# Patient Record
Sex: Male | Born: 1935 | Race: White | Hispanic: No | Smoking: Never smoker
Health system: Southern US, Community
[De-identification: ages and names within clinical notes are randomized; demographics above are authoritative.]

## PROBLEM LIST (undated history)

## (undated) ENCOUNTER — Emergency Department (HOSPITAL_COMMUNITY): Admission: EM | Payer: Medicare Other | Source: Home / Self Care

## (undated) DIAGNOSIS — F419 Anxiety disorder, unspecified: Secondary | ICD-10-CM

## (undated) DIAGNOSIS — H269 Unspecified cataract: Secondary | ICD-10-CM

## (undated) DIAGNOSIS — K219 Gastro-esophageal reflux disease without esophagitis: Secondary | ICD-10-CM

## (undated) DIAGNOSIS — I255 Ischemic cardiomyopathy: Secondary | ICD-10-CM

## (undated) DIAGNOSIS — I10 Essential (primary) hypertension: Secondary | ICD-10-CM

## (undated) DIAGNOSIS — K222 Esophageal obstruction: Secondary | ICD-10-CM

## (undated) DIAGNOSIS — E785 Hyperlipidemia, unspecified: Secondary | ICD-10-CM

## (undated) DIAGNOSIS — J189 Pneumonia, unspecified organism: Secondary | ICD-10-CM

## (undated) DIAGNOSIS — I2510 Atherosclerotic heart disease of native coronary artery without angina pectoris: Secondary | ICD-10-CM

## (undated) DIAGNOSIS — G459 Transient cerebral ischemic attack, unspecified: Secondary | ICD-10-CM

## (undated) DIAGNOSIS — E559 Vitamin D deficiency, unspecified: Secondary | ICD-10-CM

## (undated) DIAGNOSIS — K449 Diaphragmatic hernia without obstruction or gangrene: Secondary | ICD-10-CM

## (undated) DIAGNOSIS — N189 Chronic kidney disease, unspecified: Secondary | ICD-10-CM

## (undated) DIAGNOSIS — K635 Polyp of colon: Secondary | ICD-10-CM

## (undated) HISTORY — DX: Vitamin D deficiency, unspecified: E55.9

## (undated) HISTORY — DX: Ischemic cardiomyopathy: I25.5

## (undated) HISTORY — PX: CARDIAC SURGERY: SHX584

## (undated) HISTORY — PX: CHOLECYSTECTOMY: SHX55

## (undated) HISTORY — DX: Polyp of colon: K63.5

## (undated) HISTORY — PX: APPENDECTOMY: SHX54

## (undated) HISTORY — DX: Anxiety disorder, unspecified: F41.9

## (undated) HISTORY — DX: Pneumonia, unspecified organism: J18.9

## (undated) HISTORY — DX: Unspecified cataract: H26.9

## (undated) HISTORY — DX: Esophageal obstruction: K22.2

## (undated) HISTORY — PX: SHOULDER SURGERY: SHX246

## (undated) HISTORY — DX: Chronic kidney disease, unspecified: N18.9

## (undated) HISTORY — DX: Hyperlipidemia, unspecified: E78.5

## (undated) HISTORY — DX: Transient cerebral ischemic attack, unspecified: G45.9

## (undated) HISTORY — DX: Diaphragmatic hernia without obstruction or gangrene: K44.9

---

## 2001-05-12 ENCOUNTER — Ambulatory Visit (HOSPITAL_COMMUNITY): Admission: RE | Admit: 2001-05-12 | Discharge: 2001-05-12 | Payer: Self-pay | Admitting: Gastroenterology

## 2001-05-12 ENCOUNTER — Encounter (INDEPENDENT_AMBULATORY_CARE_PROVIDER_SITE_OTHER): Payer: Self-pay | Admitting: *Deleted

## 2001-05-19 ENCOUNTER — Encounter: Admission: RE | Admit: 2001-05-19 | Discharge: 2001-05-19 | Payer: Self-pay | Admitting: Gastroenterology

## 2001-05-19 ENCOUNTER — Encounter: Payer: Self-pay | Admitting: Gastroenterology

## 2001-07-21 ENCOUNTER — Encounter (INDEPENDENT_AMBULATORY_CARE_PROVIDER_SITE_OTHER): Payer: Self-pay | Admitting: *Deleted

## 2001-07-21 ENCOUNTER — Ambulatory Visit (HOSPITAL_COMMUNITY): Admission: RE | Admit: 2001-07-21 | Discharge: 2001-07-21 | Payer: Self-pay | Admitting: Gastroenterology

## 2001-10-15 ENCOUNTER — Ambulatory Visit (HOSPITAL_COMMUNITY): Admission: RE | Admit: 2001-10-15 | Discharge: 2001-10-15 | Payer: Self-pay | Admitting: Oncology

## 2001-10-15 ENCOUNTER — Encounter (HOSPITAL_COMMUNITY): Payer: Self-pay | Admitting: Oncology

## 2001-10-20 ENCOUNTER — Ambulatory Visit (HOSPITAL_COMMUNITY): Admission: RE | Admit: 2001-10-20 | Discharge: 2001-10-20 | Payer: Self-pay | Admitting: Oncology

## 2001-10-20 ENCOUNTER — Encounter (HOSPITAL_COMMUNITY): Payer: Self-pay | Admitting: Oncology

## 2001-11-02 ENCOUNTER — Encounter (INDEPENDENT_AMBULATORY_CARE_PROVIDER_SITE_OTHER): Payer: Self-pay | Admitting: Specialist

## 2001-11-02 ENCOUNTER — Ambulatory Visit (HOSPITAL_COMMUNITY): Admission: RE | Admit: 2001-11-02 | Discharge: 2001-11-02 | Payer: Self-pay | Admitting: Oncology

## 2002-04-04 ENCOUNTER — Encounter: Admission: RE | Admit: 2002-04-04 | Discharge: 2002-04-04 | Payer: Self-pay | Admitting: Gastroenterology

## 2002-04-04 ENCOUNTER — Encounter: Payer: Self-pay | Admitting: Gastroenterology

## 2002-04-13 ENCOUNTER — Encounter (INDEPENDENT_AMBULATORY_CARE_PROVIDER_SITE_OTHER): Payer: Self-pay | Admitting: Specialist

## 2002-04-13 ENCOUNTER — Ambulatory Visit (HOSPITAL_COMMUNITY): Admission: RE | Admit: 2002-04-13 | Discharge: 2002-04-13 | Payer: Self-pay | Admitting: Gastroenterology

## 2004-05-22 ENCOUNTER — Ambulatory Visit (HOSPITAL_COMMUNITY): Admission: RE | Admit: 2004-05-22 | Discharge: 2004-05-22 | Payer: Self-pay | Admitting: Internal Medicine

## 2004-10-11 ENCOUNTER — Ambulatory Visit: Payer: Self-pay | Admitting: Oncology

## 2005-01-13 ENCOUNTER — Ambulatory Visit: Payer: Self-pay | Admitting: Oncology

## 2005-03-06 ENCOUNTER — Encounter (INDEPENDENT_AMBULATORY_CARE_PROVIDER_SITE_OTHER): Payer: Self-pay | Admitting: *Deleted

## 2005-03-06 ENCOUNTER — Ambulatory Visit (HOSPITAL_COMMUNITY): Admission: RE | Admit: 2005-03-06 | Discharge: 2005-03-06 | Payer: Self-pay | Admitting: Gastroenterology

## 2005-04-11 ENCOUNTER — Ambulatory Visit: Payer: Self-pay | Admitting: Oncology

## 2007-05-19 ENCOUNTER — Ambulatory Visit (HOSPITAL_COMMUNITY): Admission: RE | Admit: 2007-05-19 | Discharge: 2007-05-19 | Payer: Self-pay | Admitting: Gastroenterology

## 2007-05-19 ENCOUNTER — Encounter (INDEPENDENT_AMBULATORY_CARE_PROVIDER_SITE_OTHER): Payer: Self-pay | Admitting: Gastroenterology

## 2011-03-03 ENCOUNTER — Other Ambulatory Visit: Payer: Self-pay | Admitting: *Deleted

## 2011-03-03 MED ORDER — SIMVASTATIN 40 MG PO TABS: 40.0000 mg | ORAL_TABLET | Freq: Every day | ORAL | Status: DC

## 2011-04-08 NOTE — Op Note (Signed)
NAME:  Bruce Green, Bruce Green NO.:  [id number]   MEDICAL RECORD NO.:  [id number]          PATIENT TYPE:  AMB   LOCATION:  ENDO                         FACILITY:  [facility]   PHYSICIAN:  Anselmo Rod, M.D.  DATE OF BIRTH:  1919-03-07   DATE OF PROCEDURE:  05/19/2007  DATE OF DISCHARGE:                               OPERATIVE REPORT   PROCEDURE PERFORMED:  Esophagogastroduodenoscopy with antral biopsies  and dilatation of a distal esophageal stricture.   ENDOSCOPIST:  Anselmo Rod, M.D.   INSTRUMENT USED:  Pentax video panendoscope.   INDICATIONS FOR PROCEDURE:  An 77 year old white male with a history of  esophageal strictures presenting with recurrent dysphagia, especially  for solids, undergoing EGD.  Dilatation is planned if needed.   PREPROCEDURE PREPARATION:  Informed consent was procured from the  patient.  The patient had fasted for 8 hours prior to the procedure.  The risks and benefits of the procedure were discussed with the patient  in great detail.   PREPROCEDURE PHYSICAL:  VITAL SIGNS:  The patient had stable vital  signs.  NECK:  Supple.  CHEST:  Clear to auscultation.  S1, S2 regular.  ABDOMEN:  Soft with normal bowel sounds.   DESCRIPTION OF PROCEDURE:  The patient was placed in the left lateral  decubitus position and sedated with 50 0 mcg of fentanyl and 5 mg of  Versed given intravenously in slow incremental doses. Once the patient  was adequately sedate and maintained on low-flow oxygen and continuous  cardiac monitoring, the Pentax video panendoscope was advanced through  the mouthpiece, over the tongue, into the esophagus under direct vision.  Mild distal esophageal narrowing was noted and this was dilated with a  controlled radial expansion balloon from 18 mm to 19 mm, to 20 mm.  The  patient tolerated the dilatation well.  There was no heme at the site of  the dilatation. A hiatal hernia was appreciated on retroflexion.  Multiple antral  erosions were present and biopsies were done to rule out  presence of H. pylori by pathology.  A few small sessile gastric polyps  were noted.  A couple of these were biopsied for pathology as well. The  proximal small bowel appeared normal.  There was no outlet obstruction.  The proximal and mid esophagus appeared normal as well.   IMPRESSION:  1. Mild distal esophageal narrowing, dilated with a balloon dilator up      to 20 mm.  2. Multiple antral erosions, biopsies done.  3. A few sessile gastric polyps, a couple of these were biopsied for      pathology.  4. Normal proximal small bowel.   RECOMMENDATIONS:  1. The patient has been advised to chew his food well and drink plenty      of fluids with his meals.  2. Continue PPI.  3. Treat with antibiotics if H. pylori present on biopsies.  4. Avoid all nonsteroidals especially over the next 4 weeks.  5. Outpatient follow-up in the next 2 weeks for further      recommendations.      Jyothi  Elsie Amis, M.D.  Electronically Signed     JNM/MEDQ  D:  05/19/2007  T:  05/19/2007  Job:  [id number]   cc:   Caryn Bee L. Little, M.D.  Fax: [phone]

## 2011-04-11 NOTE — Op Note (Signed)
[place]. [facility]  Patient:    MAKAYLA, CONFER Visit Number: [id number] MRN: [id number]          Service Type: END Location: ENDO Attending Physician:  Charna Elizabeth Dictated by:   Anselmo Rod, M.D. Proc. Date: 07/04/99 Admit Date:  04/13/2002 Discharge Date: 04/13/2002                             Operative Report  DATE OF BIRTH:  July 16, 1919  REFERRING PHYSICIAN:  PROCEDURE PERFORMED:  Esophagogastroduodenoscopy with biopsies and epinephrine injection into a gastric nodule.  ENDOSCOPIST:  Anselmo Rod, M.D.  INSTRUMENT USED:  Olympus video panendoscope.  INDICATIONS FOR PROCEDURE:  The patient is an 77 year old white male with a history of dysphagia.  The patient had a food impaction recently that resolved with barium swallow.  There was a question of a stricture in the distal esophagus where the barium tablet passed without delay.  Esophageal dilatation is planned if needed.  PREPROCEDURE PREPARATION:  Informed consent was procured from the patient. The patient was fasted for eight hours prior to the procedure and was asked to remain off of all nonsteroidals including aspirin for a week prior to the procedure.  PREPROCEDURE PHYSICAL:  The patient had stable vital signs.  Neck supple. Chest clear to auscultation.  S1, S2 regular.  Abdomen soft with normal abdominal bowel sounds.  DESCRIPTION OF PROCEDURE:  The patient was placed in left lateral decubitus position and sedated with 70 mg of Demerol and 7 mg of Versed intravenously. Once the patient was adequately sedated and maintained on low-flow oxygen and continuous cardiac monitoring, the Olympus video panendoscope was advanced through the mouthpiece, over the tongue, into the esophagus under direct vision.  The esophagus appeared normal without any evidence of stricture, esophagitis, Barretts or masses.  There was evidence of a hiatal hernia seen on retroflexion.  A small  nodule was seen on the greater curvature just below the hiatal hernia.  This was biopsied for pathology times one.  There was significant bleeding from the biopsy site and therefore 3.5 cc of epinephrine were injected into the site.  Bleeding was controlled.  There well-appearing diffuse gastritis throughout the gastric mucosa with no definite ulceration seen.  The proximal small bowel appeared normal.  IMPRESSION: 1. Small hiatal hernia. 2. Normal-appearing esophagus, no stricture noted. 3. Small nodule biopsied on greater curvature just distal to the hiatal    hernia. 4. Diffuse gastritis. 5. Normal proximal small bowel.  RECOMMENDATION: 1. Await pathology. 2. Avoid very hot or very cold meals. 3. Chew food well. 4. Outpatient follow-up for further recommendations in the next seven to ten    days. Dictated by:   Anselmo Rod, M.D. Attending Physician:  Charna Elizabeth DD:  04/14/02 TD:  04/15/02 Job: [id number]

## 2011-04-11 NOTE — Procedures (Signed)
[place]. [facility]  Patient:    Bruce Green, Bruce Green Visit Number: [id number] MRN: [id number]          Service Type: Attending:  Anselmo Rod, M.D. Proc. Date: 07/21/01   CC:         Lilia Pro, M.D.   Procedure Report  DATE OF BIRTH:  19-Apr-1919  REFERRING PHYSICIAN:  Lilia Pro, M.D.  PROCEDURE PERFORMED:  Esophagogastroduodenoscopy with Savary dilatation of an esophageal stricture and gastric biopsies..  ENDOSCOPIST:  Anselmo Rod, M.D.  INSTRUMENT USED:  Olympus video panendoscope and Savary dilators.  INDICATIONS FOR PROCEDURE:  Difficulty swallowing which is intermittent and worse with solids in an 77 year old white male with a distal esophageal stricture seen on a barium swallow.  EGD is planned with dilatation if necessary.  PREPROCEDURE PREPARATION:  Informed consent was procured from the patient. The patient was fasted for eight hours prior to the procedure and was advised to stay off of all nonsteroidals prior to the procedure.  PREPROCEDURE PHYSICAL:  The patient had stable vital signs.  Neck supple. Chest clear to auscultation.  S1, S2 regular.  Abdomen soft with normal abdominal bowel sounds.  DESCRIPTION OF PROCEDURE:  The patient was placed in left lateral decubitus position and sedated with 30 mg of Demerol and 5 mg of Versed intravenously. Once the patient was adequately sedated and maintained on low-flow oxygen and continuous cardiac monitoring, the Olympus video panendoscope was advanced through the mouthpiece, over the tongue, into the esophagus under direct vision.  The proximal esophagus esophagus appeared normal.  There was subtle narrowing of the distal esophagus for about 1 cm.  This area was gently negotiated with the scope and the scope was advanced into the stomach.  There was a hiatal seen on retroflexion and mild antral gastritis appreciated on examination of the stomach.  The proximal small bowel  appeared normal.  After passing a guide wire in a routine manner, Savary dilator #14, 15 and 16 were used serially to dilate the distal esophageal narrowing.  The patient tolerated the procedure well without complications.  Repeat EGD was done after dilatation to check for any esophageal tears.  The entire distal esophagus appeared normal without lesions.  A small gastric polyp was seen on the greater curvature that was biopsied for pathology.  The patient tolerated the procedure well without complication.  No ulcers or masses were seen.  IMPRESSION: 1. Distal esophageal stricture dilated with Savary dilators. 2. Small hiatal hernia. 3. Small gastric polyp along the greater curvature close to the cardia    biopsied for pathology. 4. Antral gastritis. 5. Normal proximal small bowel.  RECOMMENDATION: 1. Await pathology results. 2. Avoid all nonsteroidals including aspirin. 3. Continue proton pump inhibitors for now. 4. Outpatient follow-up in the four weeks. Attending:  Anselmo Rod, M.D. DD:  07/21/01 TD:  07/21/01 Job: [id number]

## 2011-04-11 NOTE — Op Note (Signed)
NAME:  Bruce Green, Bruce Green NO.:  [id number]   MEDICAL RECORD NO.:  [id number]                   PATIENT TYPE:  AMB   LOCATION:  ENDO                                 FACILITY:  [facility]   PHYSICIAN:  Lina Sar, M.D. [facility]               DATE OF BIRTH:  28-Aug-1919   DATE OF PROCEDURE:  05/22/2004  DATE OF DISCHARGE:                                 OPERATIVE REPORT   PROCEDURE:  Upper endoscopy and removal of foreign body.   INDICATIONS:  This 77 year old gentleman presented to Dr. Ruthine Dose this morning  with acute solid food dysphagia, also dysphagia to liquids for the past  three days.  He has a history of esophageal stricture, which has not been  dilated for several years.  He has not been able to swallow any food and has  been expectorating his saliva. He is now undergoing upper endoscopy and food  disimpaction.   ENDOSCOPE:  Olympus single-channel video scope.   SEDATION:  Versed 5 mg IV, fentanyl 50 mcg IV.   FINDINGS:  Olympus single-channel video scope passed under vision through  the posterior pharynx and into esophagus without difficulty.  The patient  was monitored by pulse oximeter.  His oxygen saturations were normal.  After  the introduction of the endoscope into the esophagus, there was a large  amount of liquid as well as food particles in the esophagus upper to the  level of proximal esophagus.  Most of this was suctioned out.  There was a  food impaction in the distal esophagus, which could not be pushed through.  An over tube was placed through the posterior pharynx and the endoscope was  reintroduced through the over tube.  At that point a snare was passed  through the endoscope and slightly pushed through the impaction and pushed  the impaction all the way through into the stomach.  At that point we were  able to clear the impaction completely and let the scope pass into the  stomach without difficulty.  Brief endoscopic procedure of the  stomach and  the duodenum were normal.  Retroflexion of endoscope revealed a normal  fundus and cardia.   Over tube was then removed and a guidewire was placed endoscopically into  the stomach.  Savary dilators passed into the guidewire using 14, 15, 16,  and 17 mm dilators.  There was small amount of blood on the first dilator  but not on the subsequent dilators.  The patient tolerated the procedure  well.   IMPRESSION:  1. Food impaction, status post removal of impaction.  2. Benign distal esophageal stricture, status post dilatation to 1 [place].   PLAN:  1. Proton pump inhibitor on daily basis.  2. Routine post-dilatation orders.  3. Patient will be followed in the office.  Lina Sar, M.D. [facility]    DB/MEDQ  D:  05/22/2004  T:  05/22/2004  Job:  [id number]   cc:   Angelena Sole, M.D. [facility]

## 2011-04-11 NOTE — Procedures (Signed)
[place]. [facility]  Patient:    Bruce Green, Bruce Green                     MRN: [id number] Proc. Date: 05/12/01 Adm. Date:  [id number] Attending:  Charna Elizabeth CC:         Lilia Pro, M.D.   Procedure Report  DATE OF BIRTH:  1919-07-17  REFERRING PHYSICIAN:  Lilia Pro, M.D.  PROCEDURE PERFORMED:  Colonoscopy with biopsies.  ENDOSCOPIST:  Anselmo Rod, M.D.  INSTRUMENT USED:  Olympus video colonoscope.  INDICATIONS FOR PROCEDURE:  The patient is a 77 year old male with a history of rectal bleeding on an intermittent basis and a family history of colon cancer.  Rule out colonic polyps, masses, hemorrhoids, etc.  PREPROCEDURE PREPARATION:  Informed consent was procured from the patient. The patient was fasted for eight hours prior to the procedure and prepped with a bottle of magnesium citrate and a gallon of NuLytely the night prior to the procedure.  PREPROCEDURE PHYSICAL:  The patient had stable vital signs.  Neck supple. Chest clear to auscultation.  S1, S2 regular.  Abdomen soft with normal abdominal bowel sounds.  DESCRIPTION OF PROCEDURE:  The patient was placed in the left lateral decubitus position and sedated with 50 mg of Demerol and 6 mg of Versed intravenously.  Once the patient was adequately sedated and maintained on low-flow oxygen and continuous cardiac monitoring, the Olympus video colonoscope was advanced from the rectum to the cecum with slight difficulty secondary to a very tortuous colon especially on the left side and pandiverticulosis.  There was inspissated stool in several of the diverticula. Moderate-sized, nonbleeding internal hemorrhoids were appreciated on retroflexion in the rectum.  The appendicular orifice seemed inflamed.  There was a blackish appearing base.  This was biopsied for pathology.  There was some mucinous discharge seen from the appendix during the procedure after several washes.  Small  sessile polyps were also seen in the cecal base.  These were biopsied with cold biopsy forceps.  The patient tolerated the procedure well without complications.  IMPRESSION: 1. Moderate-sized nonbleeding internal hemorrhoids. 2. Pandiverticulosis. 3. ____________ inflamed appendicular orifice with question mucin discharge.    Biopsies done, results pending. 4. Several small sessile polyps biopsied from cecal base.  RECOMMENDATIONS: 1. Await pathology results. 2. Avoid all nonsteroidals. 3. Considering the patients problem with early satiety and dysphagia, a    barium swallow has been scheduled for him. 4. Outpatient follow-up after the study has been done.DD:  05/12/01 TD:  05/12/01 Job: [id number]

## 2012-04-09 ENCOUNTER — Encounter (HOSPITAL_COMMUNITY): Payer: Self-pay

## 2012-04-09 ENCOUNTER — Ambulatory Visit (HOSPITAL_COMMUNITY): Admit: 2012-04-09 | Payer: Self-pay | Admitting: Internal Medicine

## 2012-04-09 ENCOUNTER — Encounter (HOSPITAL_COMMUNITY): Disposition: A | Discharge status: Home / Self Care | Payer: Self-pay | Attending: Internal Medicine

## 2012-04-09 ENCOUNTER — Encounter (HOSPITAL_COMMUNITY): Payer: Self-pay | Admitting: Internal Medicine

## 2012-04-09 ENCOUNTER — Ambulatory Visit (HOSPITAL_COMMUNITY)
Admit: 2012-04-09 | Discharge: 2012-04-09 | Disposition: A | Discharge status: Home / Self Care | Payer: Medicare Other | Source: Other Acute Inpatient Hospital | Attending: Internal Medicine | Admitting: Internal Medicine

## 2012-04-09 DIAGNOSIS — K449 Diaphragmatic hernia without obstruction or gangrene: Secondary | ICD-10-CM | POA: Insufficient documentation

## 2012-04-09 DIAGNOSIS — K222 Esophageal obstruction: Secondary | ICD-10-CM

## 2012-04-09 DIAGNOSIS — R1314 Dysphagia, pharyngoesophageal phase: Secondary | ICD-10-CM

## 2012-04-09 DIAGNOSIS — R1310 Dysphagia, unspecified: Secondary | ICD-10-CM | POA: Insufficient documentation

## 2012-04-09 DIAGNOSIS — IMO0002 Reserved for concepts with insufficient information to code with codable children: Secondary | ICD-10-CM | POA: Insufficient documentation

## 2012-04-09 DIAGNOSIS — Z79899 Other long term (current) drug therapy: Secondary | ICD-10-CM | POA: Insufficient documentation

## 2012-04-09 DIAGNOSIS — K219 Gastro-esophageal reflux disease without esophagitis: Secondary | ICD-10-CM | POA: Diagnosis present

## 2012-04-09 DIAGNOSIS — W449XXA Unspecified foreign body entering into or through a natural orifice, initial encounter: Secondary | ICD-10-CM | POA: Diagnosis present

## 2012-04-09 DIAGNOSIS — T18108A Unspecified foreign body in esophagus causing other injury, initial encounter: Secondary | ICD-10-CM

## 2012-04-09 HISTORY — DX: Gastro-esophageal reflux disease without esophagitis: K21.9

## 2012-04-09 HISTORY — PX: ESOPHAGOGASTRODUODENOSCOPY: SHX5428

## 2012-04-09 SURGERY — EGD (ESOPHAGOGASTRODUODENOSCOPY)
Anesthesia: Moderate Sedation

## 2012-04-09 MED ORDER — PANTOPRAZOLE SODIUM 40 MG PO TBEC: 40.0000 mg | DELAYED_RELEASE_TABLET | Freq: Every day | ORAL | Status: DC

## 2012-04-09 MED ORDER — FENTANYL CITRATE 0.05 MG/ML IJ SOLN
INTRAMUSCULAR | Status: AC
  Filled 2012-04-09: qty 4

## 2012-04-09 MED ORDER — BUTAMBEN-TETRACAINE-BENZOCAINE 2-2-14 % EX AERO
INHALATION_SPRAY | CUTANEOUS | Status: DC | PRN
  Administered 2012-04-09: 1 via TOPICAL

## 2012-04-09 MED ORDER — SODIUM CHLORIDE 0.45 % IV SOLN: Freq: Once | INTRAVENOUS | Status: DC

## 2012-04-09 MED ORDER — FENTANYL NICU IV SYRINGE 50 MCG/ML
INJECTION | INTRAMUSCULAR | Status: DC | PRN
  Administered 2012-04-09: 25 ug via INTRAVENOUS
  Administered 2012-04-09: 12.5 ug via INTRAVENOUS

## 2012-04-09 MED ORDER — SODIUM CHLORIDE 0.9 % IV SOLN
INTRAVENOUS | Status: DC
  Administered 2012-04-09: 500 mL via INTRAVENOUS

## 2012-04-09 MED ORDER — MIDAZOLAM HCL 10 MG/2ML IJ SOLN
INTRAMUSCULAR | Status: AC
  Filled 2012-04-09: qty 4

## 2012-04-09 MED ORDER — MIDAZOLAM HCL 10 MG/2ML IJ SOLN
INTRAMUSCULAR | Status: DC | PRN
  Administered 2012-04-09: 2 mg via INTRAVENOUS
  Administered 2012-04-09: 1 mg via INTRAVENOUS

## 2012-04-09 NOTE — Op Note (Signed)
[facility] [address] [place], [place]  [address]  ENDOSCOPY PROCEDURE REPORT  PATIENT:  Bruce Green, Bruce Green  MR#:  [id number] BIRTHDATE:  February 09, 1919, 89 yrs. old  GENDER:  male  ENDOSCOPIST:  Iva Boop, MD, FACG ASSISTANT:  Claudie Revering, RN [facility] and Kandice Robinsons  PROCEDURE DATE:  04/09/2012 PROCEDURE:  Esophagoscopy with balloon dilation (<12mm) ASA CLASS:  Class II INDICATIONS:  1) dysphagia food impaction  MEDICATIONS:   Fentanyl 37.5 mcg IV, Versed 3 mg IV TOPICAL ANESTHETIC:  Cetacaine Spray  DESCRIPTION OF PROCEDURE:   After the risks benefits and alternatives of the procedure were thoroughly explained, informed consent was obtained.  The Pentax Gastroscope [id number] endoscope was introduced through the mouth and advanced to the stomach body, without limitations.  The instrument was slowly withdrawn as the mucosa was carefully examined. <<PROCEDUREIMAGES>>  Retained food was present in the distal esophagus. Solid and liquid. It was advanced into the stomach with gentle pressure from the scope.  A stricture was found in the distal esophagus. Ring-like with slight heme.  A hiatal hernia was found. It was 6 cm in size. 40-46 cm  The examination was otherwise normal. Dilation was then performed at the distal esophagus  1) Dilator:  Balloon  Size(s):  15, 16.5, 18 mm Resistance:  moderate  Heme:  none Appearance:  adequate  COMPLICATIONS:  None  ENDOSCOPIC IMPRESSION: 1) Food, retained in the distal esophagus 2) Stricture in the distal esophagus - dilated to 18 mm 3) 6 cm hiatal hernia 4) Otherwise normal examination to gastric body RECOMMENDATIONS: 1) Liquids only tonight then try normal foods tomorrow 2) Change from Prilosec OTC daily to pantoprazole 40 mg daily 3) follow-up Dr. Leone Payor as needed  Iva Boop, MD, [facility]  CC:  Marinda Elk, MD and The Patient  n. eSIGNED:   Iva Boop at 04/09/2012 10:14 PM  Randa Spike,  [phone]

## 2012-04-09 NOTE — Discharge Instructions (Addendum)
Food was lodged in the esophagus. It was removed and the stricture causing obstruction was dilated. Please get the prescription for pantoprazole filled and take that every day to reduce chances of this happening again. Keep taking that medicine unless a doctor tells you to stop. Liquids only tonight and try normal foods tomorrow.  If you continue to have swallowing problems call my office and make an appointment to see Dr. Leone Payor  YOU HAD AN ENDOSCOPIC PROCEDURE TODAY: Refer to the procedure report and other information in the discharge instructions given to you for any specific questions about what was found during the examination. If this information does not answer your questions, please call Dr. Marvell Fuller office at [phone] to clarify.   YOU SHOULD EXPECT: Some feelings of bloating in the abdomen. Passage of more gas than usual. Walking can help get rid of the air that was put into your GI tract during the procedure and reduce the bloating. If you had a lower endoscopy (such as a colonoscopy or flexible sigmoidoscopy) you may notice spotting of blood in your stool or on the toilet paper. Some abdominal soreness may be present for a day or two, also.  DIET: Liquids only until tomorrow. Then try solid food. Drink plenty of fluids but you should avoid alcoholic beverages for 24 hours.   ACTIVITY: Your care partner should take you home directly after the procedure. You should plan to take it easy, moving slowly for the rest of the day. You can resume normal activity the day after the procedure however YOU SHOULD NOT DRIVE, use power tools, machinery or perform tasks that involve climbing or major physical exertion for 24 hours (because of the sedation medicines used during the test).   SYMPTOMS TO REPORT IMMEDIATELY: A gastroenterologist can be reached at any hour. Please call [phone]  for any of the following symptoms:  Following lower endoscopy (colonoscopy, flexible  sigmoidoscopy) Excessive amounts of blood in the stool  Significant tenderness, worsening of abdominal pains  Swelling of the abdomen that is new, acute  Fever of 100 or higher  Following upper endoscopy (EGD, EUS, ERCP, esophageal dilation) Vomiting of blood or coffee ground material  New, significant abdominal pain  New, significant chest pain or pain under the shoulder blades  Painful or persistently difficult swallowing  New shortness of breath  Black, tarry-looking or red, bloody stools  FOLLOW UP:  Please call with any specific questions about appointments or follow up tests.  Endoscopy Care After Please read the instructions outlined below and refer to this sheet in the next few weeks. These discharge instructions provide you with general information on caring for yourself after you leave the hospital. Your doctor may also give you specific instructions. While your treatment has been planned according to the most current medical practices available, unavoidable complications occasionally occur. If you have any problems or questions after discharge, please call your doctor. HOME CARE INSTRUCTIONS Activity  You may resume your regular activity but move at a slower pace for the next 24 hours.   Take frequent rest periods for the next 24 hours.   Walking will help expel (get rid of) the air and reduce the bloated feeling in your abdomen.   No driving for 24 hours (because of the anesthesia (medicine) used during the test).   You may shower.   Do not sign any important legal documents or operate any machinery for 24 hours (because of the anesthesia used during the test).  Nutrition  Drink plenty  of fluids.   You may resume your normal diet.   Begin with a light meal and progress to your normal diet.   Avoid alcoholic beverages for 24 hours or as instructed by your caregiver.  Medications You may resume your normal medications unless your caregiver tells you  otherwise. What you can expect today  You may experience abdominal discomfort such as a feeling of fullness or "gas" pains.   You may experience a sore throat for 2 to 3 days. This is normal. Gargling with salt water may help this.  Follow-up Your doctor will discuss the results of your test with you. SEEK IMMEDIATE MEDICAL CARE IF:  You have excessive nausea (feeling sick to your stomach) and/or vomiting.   You have severe abdominal pain and distention (swelling).   You have trouble swallowing.   You have a temperature over 100 F (37.8 C).   You have rectal bleeding or vomiting of blood.  Document Released: 06/24/2004 Document Revised: 10/30/2011 Document Reviewed: 01/05/2008 [facility] Patient Information 2012 [place], [place].

## 2012-04-09 NOTE — H&P (Signed)
  Cc:  Dysphagia and food impaction  HPI  5 days ago ate steak and unable to keep food or liquids down since. Hx of prior stricture of esophagus and dilation, last 2008.   Allergies not on file @[facility] @ Past Medical History  Diagnosis Date  . GERD with stricture    Past Surgical History  Procedure Date  . Esophagogastroduodenoscopy multiple  . Cholecystectomy   . Shoulder surgery    History   Social History  . Marital Status: Widowed    Spouse Name: N/A    Number of Children: N/A  . Years of Education: N/A   Occupational History  . retired    Social History Main Topics  . Smoking status: Never Smoker   . Smokeless tobacco: Never Used  . Alcohol Use: 0.5 oz/week    1 drink(s) per week  . Drug Use: No  . Sexually Active: None   Other Topics Concern  . None   Social History Narrative   Retired Charity fundraiser. Widowed.   No family history on file.  PE: A & O x 3 Lungs clear Heart S1 S2 no murmur Abdomen soft and nontender Ext no edema  Assesment and plan:  Food impaction of esophagus and dysphagia

## 2012-04-12 ENCOUNTER — Ambulatory Visit (HOSPITAL_COMMUNITY): Admit: 2012-04-12 | Payer: Self-pay | Admitting: Internal Medicine

## 2012-04-12 ENCOUNTER — Encounter (HOSPITAL_COMMUNITY): Payer: Self-pay | Admitting: Internal Medicine

## 2012-04-15 ENCOUNTER — Telehealth: Payer: Self-pay | Admitting: Internal Medicine

## 2012-04-15 DIAGNOSIS — K625 Hemorrhage of anus and rectum: Secondary | ICD-10-CM

## 2012-04-15 NOTE — Telephone Encounter (Signed)
Left message for patient to call back  

## 2012-04-16 NOTE — Telephone Encounter (Signed)
Patient calling to report on Saturday morning after his procedure, he woke up with dark red stool in his bed and in bathroom. States he was fine after this until yesterday. States he started having diarrhea. Today, he has had 4-5 urgent diarrhea stools, yellow in color. Denies cramping or pain. Eating normally. States Dr. Leone Payor did change him from Omeprazole to Protonix last Friday. Per Mike Gip, PA , go back to Omeprazole over the weekend and take Imodium prn.May need to go to urgent care if diarrhea gets worse over weekend. Call on Tuesday with update.Patient given these recommendations.

## 2012-04-20 NOTE — Telephone Encounter (Signed)
I spoke with the patient and he is much better.  No further bleeding or diarrhea.  He will call back for further questions or problems

## 2012-04-20 NOTE — Telephone Encounter (Signed)
I would like to have him set up a non-urgent office visit and also have him get a cbc due to the blood in his stool.

## 2012-04-20 NOTE — Telephone Encounter (Signed)
Patient advised he will come for CBC Thursday or Friday and REV next week on Tuesday

## 2012-04-22 ENCOUNTER — Other Ambulatory Visit (INDEPENDENT_AMBULATORY_CARE_PROVIDER_SITE_OTHER): Payer: Medicare Other

## 2012-04-22 ENCOUNTER — Telehealth: Payer: Self-pay | Admitting: Internal Medicine

## 2012-04-22 DIAGNOSIS — K625 Hemorrhage of anus and rectum: Secondary | ICD-10-CM

## 2012-04-22 LAB — CBC WITH DIFFERENTIAL/PLATELET
Basophils Relative: 0.5 % (ref 0.0–3.0)
Eosinophils Relative: 1.9 % (ref 0.0–5.0)
HCT: 47.2 % (ref 39.0–52.0)
Lymphs Abs: 2 10*3/uL (ref 0.7–4.0)
MCV: 87.3 fl (ref 78.0–100.0)
Monocytes Absolute: 0.7 10*3/uL (ref 0.1–1.0)
RBC: 5.4 Mil/uL (ref 4.22–5.81)
WBC: 10.2 10*3/uL (ref 4.5–10.5)

## 2012-04-22 NOTE — Telephone Encounter (Signed)
I advised that CBC looks good and that he should keep REV next week.

## 2012-04-22 NOTE — Progress Notes (Signed)
Quick Note:  Hgb normal - please let him know he did not lose a lot of blood Will see him for follow-up 6/4 ______

## 2012-04-22 NOTE — Progress Notes (Signed)
LM for pt to call back for lab results.

## 2012-04-23 NOTE — Progress Notes (Signed)
Pt informed of lab results.  Also reminded of his appointment next week with Dr. Leone Payor.  Wants to discuss what happened post procedure at home.  He was home alone, grandchildren went home.  He awoke and had lost bowel control and Fluid colored like orange juice came out of his mouth.  Reports feeling fine now.

## 2012-04-27 ENCOUNTER — Ambulatory Visit (INDEPENDENT_AMBULATORY_CARE_PROVIDER_SITE_OTHER): Payer: Medicare Other | Admitting: Internal Medicine

## 2012-04-27 ENCOUNTER — Encounter: Payer: Self-pay | Admitting: Internal Medicine

## 2012-04-27 VITALS — BP 144/72 | HR 80 | Ht 70.0 in | Wt 159.8 lb

## 2012-04-27 DIAGNOSIS — K222 Esophageal obstruction: Secondary | ICD-10-CM

## 2012-04-27 DIAGNOSIS — K219 Gastro-esophageal reflux disease without esophagitis: Secondary | ICD-10-CM

## 2012-04-27 NOTE — Patient Instructions (Signed)
Sorry for the problems you had after the procedure and glad you are better. Please call me back if you have swallowing problems or think you are bleeding again. Iva Boop, MD, [facility]

## 2012-04-27 NOTE — Progress Notes (Signed)
Patient ID: Bruce Green, male   DOB: [birth year], 78 y.o.   MRN: [id number]  Chief complaint:  Followup of esophageal stricture and question of bleeding after dilation  History of present illness:   Patient presents for followup after upper GI endoscopy with removal of a food impaction in the esophagus and dilation of esophageal stricture, on May 17. He called the opposite a few days after that reporting that when he awakened of the day after the procedure he had crampy abdominal pain and profuse diarrhea. It sounded like there could of been some blood in it. When I talked to him today he said it was red and black but he also said it might of engorgement. He cannot really tell me for sure what color the stool was. He is here with his granddaughter, she was not present. He is upset that he slept for about 20 hours after the procedure. He said that when he had this done 5 or 6 years ago, he did not have as much post procedure sedation issues. At this point he is having no problems, no dysphagia or heartburn, he is taking omeprazole 40 mg daily.  I did have him check a hemoglobin when he called and that was normal at 12.  Medications, allergies, past medical history, past surgical history, family history and social history are reviewed and updated in the EMR.  Physical exam:  His general appearance is that he appears extremely younger than his stated age of 32. Vital signs are listed above.  Assessment and plan:  1. Esophageal stricture   2. GERD (gastroesophageal reflux disease)    Both of these are improved status post dilation with increased omeprazole 40 mg daily. I'm not sure what happened afterwards, he had a typical amount of sedation that would not seem to be excessive even a 78 year old. He did have an extreme post procedure sedation issue, fortunately he is okay. Unfortunately he had an episode of diarrhea and soiled his bed and bedclothes and had to pay for some cleanup. I really  don't know he had a need bleeding, it's possible he had some bleeding from esophageal dilation. At any rate his stools are normal now, his hemoglobin was normal and he seems well.  We talked about other endoscopic workup, and between talking to the patient and his granddaughter have decided to observe and see if he has further bleeding, if he does, would need to consider a colonoscopy but based upon the overall scenario we don't feel like the evidence of ports doing that at this time, nor does the risk-benefit ratio favors the procedure.  CC: Daisy Floro, MD,

## 2012-12-31 ENCOUNTER — Other Ambulatory Visit: Payer: Self-pay | Admitting: Internal Medicine

## 2013-03-29 ENCOUNTER — Other Ambulatory Visit: Payer: Self-pay | Admitting: Internal Medicine

## 2014-03-24 DIAGNOSIS — I255 Ischemic cardiomyopathy: Secondary | ICD-10-CM

## 2014-03-24 HISTORY — DX: Ischemic cardiomyopathy: I25.5

## 2014-04-06 ENCOUNTER — Ambulatory Visit
Admission: RE | Admit: 2014-04-06 | Discharge: 2014-04-06 | Disposition: A | Discharge status: Home / Self Care | Payer: Medicare Other | Source: Ambulatory Visit | Attending: Family Medicine | Admitting: Family Medicine

## 2014-04-06 ENCOUNTER — Other Ambulatory Visit: Payer: Self-pay | Admitting: *Deleted

## 2014-04-06 ENCOUNTER — Other Ambulatory Visit: Payer: Self-pay | Admitting: Family Medicine

## 2014-04-06 DIAGNOSIS — R0600 Dyspnea, unspecified: Secondary | ICD-10-CM

## 2014-04-06 MED ORDER — IOHEXOL 350 MG/ML SOLN
100.0000 mL | Freq: Once | INTRAVENOUS | Status: AC | PRN
  Administered 2014-04-06: 100 mL via INTRAVENOUS

## 2014-04-10 ENCOUNTER — Encounter: Payer: Self-pay | Admitting: Cardiovascular Disease

## 2014-04-10 ENCOUNTER — Ambulatory Visit (INDEPENDENT_AMBULATORY_CARE_PROVIDER_SITE_OTHER): Payer: Medicare Other | Admitting: Cardiovascular Disease

## 2014-04-10 VITALS — BP 154/76 | HR 96 | Resp 16 | Ht 70.0 in | Wt 160.6 lb

## 2014-04-10 DIAGNOSIS — I517 Cardiomegaly: Secondary | ICD-10-CM | POA: Insufficient documentation

## 2014-04-10 DIAGNOSIS — I2510 Atherosclerotic heart disease of native coronary artery without angina pectoris: Secondary | ICD-10-CM

## 2014-04-10 DIAGNOSIS — R0989 Other specified symptoms and signs involving the circulatory and respiratory systems: Secondary | ICD-10-CM

## 2014-04-10 DIAGNOSIS — R0600 Dyspnea, unspecified: Secondary | ICD-10-CM

## 2014-04-10 DIAGNOSIS — R011 Cardiac murmur, unspecified: Secondary | ICD-10-CM | POA: Insufficient documentation

## 2014-04-10 DIAGNOSIS — R0609 Other forms of dyspnea: Secondary | ICD-10-CM

## 2014-04-10 DIAGNOSIS — R0602 Shortness of breath: Secondary | ICD-10-CM

## 2014-04-10 NOTE — Patient Instructions (Signed)
Your physician has requested that you have an echocardiogram. Echocardiography is a painless test that uses sound waves to create images of your heart. It provides your doctor with information about the size and shape of your heart and how well your heart's chambers and valves are working. This procedure takes approximately one hour. There are no restrictions for this procedure.  Dr Sallyanne Kuster recommends that you schedule a follow-up appointment after your echocardiogram.

## 2014-04-10 NOTE — Progress Notes (Signed)
Patient ID: Bruce Green, male   DOB: Mar 26, 1919, 80 y.o.   MRN: [id number]      Reason for office visit Exertional dyspnea  Bruce Green is a remarkably well-preserved 80 year old [employer] War [employer] veteran("I landed with [employer]. [employer] and went all the way to [place]"), who developed fairly abrupt onset of exertional dyspnea about 2-3 months ago. Symptoms never occur at rest. He sleeps very well and does not have orthopnea or PND or edema. He is frustrated that he cannot perform more than 15 minutes of work in his yard without becoming dyspneic. He never experiences chest tightness or pain. He complains of occasional positional lightheadedness, but has not had syncope and is unaware of palpitations.  He is also remarkably healthy. The only medication he takes is a proton pump inhibitor for gastroesophageal reflux. This has been complicated by a stricture that has required repeated dilatation.  History of shortness of breath led to a CT angiogram of the chest to exclude pulmonary embolism. None was seen. There is incidentally noted cardiomegaly with a prominent left ventricle. There is significant calcification in the coronary system, especially obvious in the proximal and mid LAD artery but also scattered in the other major coronary vessels. The descending thoracic aorta appears a little dilated with very prominent thick plaque and scattered calcifications. The ascending aorta appears relatively free of disease.   He has polycythemia vera that has not required treatment and occasional urinary tract infection. There is a history of seizures back in 1995 but he is not taking any medications for seizures at this time. He has never smoked cigarettes. He drinks a small shot of bourbon a couple of days a week.  He is very active. Enjoys dancing. He has traveled a lot and has managed 3 companies in the past. He has been a widow for last 9 years and still lives independently, taking care of his entire household. He  felt insulted when one of his previous healthcare providers told them that he was just "getting old".   Allergies  Allergen Reactions  . Sulfa Antibiotics Rash    Current Outpatient Prescriptions  Medication Sig Dispense Refill  . pantoprazole (PROTONIX) 40 MG tablet TAKE 1 TABLET BY MOUTH DAILY 30 MINUTES BEFORE BREAKFAST  90 tablet  2   No current facility-administered medications for this visit.    Past Medical History  Diagnosis Date  . Stricture esophagus     distal  . GERD (gastroesophageal reflux disease)   . Hiatal hernia     6cm    Past Surgical History  Procedure Laterality Date  . Cholecystectomy    . Shoulder surgery    . Appendectomy    . Esophagogastroduodenoscopy  04/09/2012    Procedure: ESOPHAGOGASTRODUODENOSCOPY (EGD);  Surgeon: Gatha Mayer, MD;  Location: [facility];  Service: Endoscopy;  Laterality: N/A;    Family History  Problem Relation Age of Onset  . Heart disease Mother   . Kidney disease Father     History   Social History  . Marital Status: Widowed    Spouse Name: N/A    Number of Children: N/A  . Years of Education: N/A   Occupational History  . retired    Social History Main Topics  . Smoking status: Never Smoker   . Smokeless tobacco: Never Used  . Alcohol Use: 0.5 oz/week    1 drink(s) per week     Comment: rare  . Drug Use: No  . Sexual Activity: Not on file  Other Topics Concern  . Not on file   Social History Narrative   Retired Conservation officer, historic buildings. Widowed.          Review of systems: The patient specifically denies any chest pain at rest or with exertion, dyspnea at rest, orthopnea, paroxysmal nocturnal dyspnea, syncope, palpitations, focal neurological deficits, intermittent claudication, lower extremity edema, unexplained weight gain, cough, hemoptysis or wheezing.  The patient also denies abdominal pain, nausea, vomiting, dysphagia, diarrhea, constipation, polyuria, polydipsia, dysuria, hematuria, frequency,  urgency, abnormal bleeding or bruising, fever, chills, unexpected weight changes, mood swings, change in skin or hair texture, change in voice quality, auditory or visual problems, allergic reactions or rashes, new musculoskeletal complaints other than usual "aches and pains".   PHYSICAL EXAM BP 154/76  Pulse 96  Resp 16  Ht 5\' 10"  (1.778 m)  Wt 160 lb 9.6 oz (72.848 kg)  BMI 23.04 kg/m2  General: Alert, oriented x3, no distress Head: no evidence of trauma, PERRL, EOMI, no exophtalmos or lid lag, no myxedema, no xanthelasma; normal ears, nose and oropharynx Neck: normal jugular venous pulsations and no hepatojugular reflux; brisk carotid pulses without delay and no carotid bruits Chest: clear to auscultation, no signs of consolidation by percussion or palpation, normal fremitus, symmetrical and full respiratory excursions Cardiovascular: normal position and quality of the apical impulse, regular rhythm, normal first and second heart sounds, no rubs or gallops, 2-0/2 holosystolic apical murmur Abdomen: no tenderness or distention, no masses by palpation, no abnormal pulsatility or arterial bruits, normal bowel sounds, no hepatosplenomegaly Extremities: no clubbing, cyanosis or edema; 2+ radial, ulnar and brachial pulses bilaterally; 2+ right femoral, posterior tibial and dorsalis pedis pulses; 2+ left femoral, posterior tibial and dorsalis pedis pulses; no subclavian or femoral bruits Neurological: grossly nonfocal   EKG: NSR, frequent PACs and PVCs, nonspecific ST-T changes  Lipid Panel  No results found for this basename: chol, trig, hdl, cholhdl, vldl, ldlcalc    BMET No results found for this basename: na, k, cl, co2, glucose, bun, creatinine, calcium, gfrnonaa, gfraa     ASSESSMENT AND PLAN  His symptoms are concerning for CHF and the murmur and cardiac enlargement suggest cardiomyopathy and mitral insufficiency (primary or secondary?), while heavy coronary calcification raises  the possibility of ischemic cardiomyopathy, even in the absence of angina. Despite his advanced age, he is otherwise a very well preserved individual, intent on length and quality of life. Will check echo - if LVEF is low, proceed directly to coronary angiography. If not, may consider a nuclear stress test, unless the echo shows an alternative explanation for symptoms.  Orders Placed This Encounter  Procedures  . EKG 12-Lead  . 2D Echocardiogram without contrast   No orders of the defined types were placed in this encounter.    Synda Bagent  Sanda Klein, MD, [facility] [phone] office [phone] pager

## 2014-04-12 ENCOUNTER — Ambulatory Visit (HOSPITAL_COMMUNITY)
Admission: RE | Admit: 2014-04-12 | Discharge: 2014-04-12 | Disposition: A | Discharge status: Home / Self Care | Payer: Medicare Other | Source: Ambulatory Visit | Attending: Cardiology | Admitting: Cardiology

## 2014-04-12 DIAGNOSIS — I359 Nonrheumatic aortic valve disorder, unspecified: Secondary | ICD-10-CM

## 2014-04-12 DIAGNOSIS — R0600 Dyspnea, unspecified: Secondary | ICD-10-CM

## 2014-04-12 DIAGNOSIS — R0609 Other forms of dyspnea: Secondary | ICD-10-CM | POA: Insufficient documentation

## 2014-04-12 DIAGNOSIS — R0989 Other specified symptoms and signs involving the circulatory and respiratory systems: Principal | ICD-10-CM | POA: Insufficient documentation

## 2014-04-12 DIAGNOSIS — R0602 Shortness of breath: Secondary | ICD-10-CM

## 2014-04-12 NOTE — Progress Notes (Signed)
2D Echo Performed 04/12/2014    Bruce Green, RCS Spoke with Dr. Percival Spanish before patient left office about echo.

## 2014-04-18 ENCOUNTER — Encounter: Payer: Self-pay | Admitting: Cardiovascular Disease

## 2014-04-18 ENCOUNTER — Ambulatory Visit (INDEPENDENT_AMBULATORY_CARE_PROVIDER_SITE_OTHER): Payer: Medicare Other | Admitting: Cardiovascular Disease

## 2014-04-18 ENCOUNTER — Encounter (HOSPITAL_COMMUNITY): Payer: Self-pay | Admitting: Pharmacy Technician

## 2014-04-18 VITALS — BP 120/64 | HR 74 | Ht 70.0 in | Wt 160.5 lb

## 2014-04-18 DIAGNOSIS — Z79899 Other long term (current) drug therapy: Secondary | ICD-10-CM

## 2014-04-18 DIAGNOSIS — I5020 Unspecified systolic (congestive) heart failure: Secondary | ICD-10-CM

## 2014-04-18 DIAGNOSIS — I429 Cardiomyopathy, unspecified: Secondary | ICD-10-CM

## 2014-04-18 DIAGNOSIS — I509 Heart failure, unspecified: Secondary | ICD-10-CM

## 2014-04-18 DIAGNOSIS — I428 Other cardiomyopathies: Secondary | ICD-10-CM

## 2014-04-18 DIAGNOSIS — D689 Coagulation defect, unspecified: Secondary | ICD-10-CM

## 2014-04-18 DIAGNOSIS — R5383 Other fatigue: Secondary | ICD-10-CM

## 2014-04-18 DIAGNOSIS — R5381 Other malaise: Secondary | ICD-10-CM

## 2014-04-18 DIAGNOSIS — Z01818 Encounter for other preprocedural examination: Secondary | ICD-10-CM

## 2014-04-18 LAB — BASIC METABOLIC PANEL
BUN: 29 mg/dL — ABNORMAL HIGH (ref 6–23)
CHLORIDE: 107 meq/L (ref 96–112)
CO2: 27 mEq/L (ref 19–32)
Calcium: 9.5 mg/dL (ref 8.4–10.5)
Creat: 1.07 mg/dL (ref 0.50–1.35)
GLUCOSE: 131 mg/dL — AB (ref 70–99)
POTASSIUM: 4.6 meq/L (ref 3.5–5.3)
SODIUM: 141 meq/L (ref 135–145)

## 2014-04-18 NOTE — Patient Instructions (Signed)
Your physician has requested that you have a cardiac catheterization this week. Cardiac catheterization is used to diagnose and/or treat various heart conditions. Doctors may recommend this procedure for a number of different reasons. The most common reason is to evaluate chest pain. Chest pain can be a symptom of coronary artery disease (CAD), and cardiac catheterization can show whether plaque is narrowing or blocking your heart's arteries. This procedure is also used to evaluate the valves, as well as measure the blood flow and oxygen levels in different parts of your heart. For further information please visit [facility]. Please follow instruction sheet, as given.  Please have blood work done today at the [facility] in the [employer] ([place] [facility]

## 2014-04-19 ENCOUNTER — Encounter: Payer: Self-pay | Admitting: Cardiovascular Disease

## 2014-04-19 DIAGNOSIS — I429 Cardiomyopathy, unspecified: Secondary | ICD-10-CM | POA: Insufficient documentation

## 2014-04-19 DIAGNOSIS — I5020 Unspecified systolic (congestive) heart failure: Secondary | ICD-10-CM | POA: Insufficient documentation

## 2014-04-19 LAB — CBC
HEMATOCRIT: 43.5 % (ref 39.0–52.0)
HEMOGLOBIN: 14 g/dL (ref 13.0–17.0)
MCH: 24.9 pg — ABNORMAL LOW (ref 26.0–34.0)
MCHC: 32.2 g/dL (ref 30.0–36.0)
MCV: 77.4 fL — ABNORMAL LOW (ref 78.0–100.0)
Platelets: 255 10*3/uL (ref 150–400)
RBC: 5.62 MIL/uL (ref 4.22–5.81)
RDW: 16.4 % — ABNORMAL HIGH (ref 11.5–15.5)
WBC: 8.5 10*3/uL (ref 4.0–10.5)

## 2014-04-19 LAB — PROTIME-INR
INR: 1.03 (ref <1.50)
PROTHROMBIN TIME: 13.4 s (ref 11.6–15.2)

## 2014-04-19 LAB — TSH: TSH: 1.319 u[IU]/mL (ref 0.350–4.500)

## 2014-04-19 LAB — APTT: aPTT: 31 seconds (ref 24–37)

## 2014-04-19 NOTE — Progress Notes (Signed)
Patient ID: Bruce Green, male   DOB: Jul 23, 1919, 80 y.o.   MRN: [id number]     Reason for office visit Dyspnea on exertion, followup echocardiogram  Bruce Green returns in followup after undergoing an echocardiogram for recent onset exertional dyspnea. He is a very active and mentally well preserved World War II veteran who is very frustrated with the fact that he can no longer be physically active. He again denies angina pectoris.  His echocardiogram showed the surprising finding of moderately severe depressed left ventricular systolic function with a global pattern. Extensive coronary artery calcification was noted on recent CT angiogram of the chest performed to exclude pulmonary embolism.  He is otherwise in good shape. He has polycythemia vera that has never required chemotherapy or phlebotomy. He has occasional urinary infections and a remote history of seizures for which he no longer takes medications. He has never smoked and does not have diabetes mellitus.  ECHO 04/12/2014 - Left ventricle: There was mild concentric hypertrophy. Systolic function was severely reduced. The estimated ejection fraction was in the range of 25% to 30%. Global hypokinesis. Doppler parameters are consistent with abnormal left ventricular relaxation (grade 1 diastolic dysfunction). - Aortic valve: Trileaflet. Sclerosis without stenosis. There was mild, posteriorly directed regurgitation. - Aorta: The aortic root is borderline enlarged. Aortic root dimension: 3.9 cm (ED). - Mitral valve: Moderately thickened leaflets, particularly at the leaflet tips. There is reduced anterior leaflet excursion due to posteriorly directed AI. There is mild mitral regurgitation. - Left atrium: LA volume/ BSA = 42.1 ml/m2. Moderately dilated. - Atrial septum: No defect or patent foramen ovale was identified.    Allergies  Allergen Reactions  . Sulfa Antibiotics Rash    Current Outpatient Prescriptions  Medication  Sig Dispense Refill  . B Complex-C-Calcium (B-COMPLEX/VITAMIN C PO) Take 1 tablet by mouth daily.      Marland Kitchen CALCIUM PO Take 1 tablet by mouth daily.      . naproxen sodium (ANAPROX) 220 MG tablet Take 220 mg by mouth daily.      . pantoprazole (PROTONIX) 40 MG tablet Take 40 mg by mouth daily.       No current facility-administered medications for this visit.    Past Medical History  Diagnosis Date  . Stricture esophagus     distal  . GERD (gastroesophageal reflux disease)   . Hiatal hernia     6cm    Past Surgical History  Procedure Laterality Date  . Cholecystectomy    . Shoulder surgery    . Appendectomy    . Esophagogastroduodenoscopy  04/09/2012    Procedure: ESOPHAGOGASTRODUODENOSCOPY (EGD);  Surgeon: Gatha Mayer, MD;  Location: [facility];  Service: Endoscopy;  Laterality: N/A;    Family History  Problem Relation Age of Onset  . Heart disease Mother   . Kidney disease Father     History   Social History  . Marital Status: Widowed    Spouse Name: N/A    Number of Children: N/A  . Years of Education: N/A   Occupational History  . retired    Social History Main Topics  . Smoking status: Never Smoker   . Smokeless tobacco: Never Used  . Alcohol Use: 0.5 oz/week    1 drink(s) per week     Comment: rare  . Drug Use: No  . Sexual Activity: Not on file   Other Topics Concern  . Not on file   Social History Narrative   Retired Conservation officer, historic buildings. Widowed.  Review of systems: The patient specifically denies any chest pain at rest or with exertion, dyspnea at rest, orthopnea, paroxysmal nocturnal dyspnea, syncope, palpitations, focal neurological deficits, intermittent claudication, lower extremity edema, unexplained weight gain, cough, hemoptysis or wheezing.  The patient also denies abdominal pain, nausea, vomiting, dysphagia, diarrhea, constipation, polyuria, polydipsia, dysuria, hematuria, frequency, urgency, abnormal bleeding or bruising, fever,  chills, unexpected weight changes, mood swings, change in skin or hair texture, change in voice quality, auditory or visual problems, allergic reactions or rashes, new musculoskeletal complaints other than usual "aches and pains".   PHYSICAL EXAM BP 120/64  Pulse 74  Ht 5\' 10"  (1.778 m)  Wt 160 lb 8 oz (72.802 kg)  BMI 23.03 kg/m2 General: Alert, oriented x3, no distress  Head: no evidence of trauma, PERRL, EOMI, no exophtalmos or lid lag, no myxedema, no xanthelasma; normal ears, nose and oropharynx  Neck: normal jugular venous pulsations and no hepatojugular reflux; brisk carotid pulses without delay and no carotid bruits  Chest: clear to auscultation, no signs of consolidation by percussion or palpation, normal fremitus, symmetrical and full respiratory excursions  Cardiovascular: normal position and quality of the apical impulse, regular rhythm, normal first and second heart sounds, no rubs or gallops, 7-5/6 holosystolic apical murmur  Abdomen: no tenderness or distention, no masses by palpation, no abnormal pulsatility or arterial bruits, normal bowel sounds, no hepatosplenomegaly  Extremities: no clubbing, cyanosis or edema; 2+ radial, ulnar and brachial pulses bilaterally; 2+ right femoral, posterior tibial and dorsalis pedis pulses; 2+ left femoral, posterior tibial and dorsalis pedis pulses; no subclavian or femoral bruits  Neurological: grossly nonfocal  EKG: NSR, frequent PACs and PVCs, nonspecific ST-T changes   BMET    Component Value Date/Time   NA 141 04/18/2014 1544   K 4.6 04/18/2014 1544   CL 107 04/18/2014 1544   CO2 27 04/18/2014 1544   GLUCOSE 131* 04/18/2014 1544   BUN 29* 04/18/2014 1544   CREATININE 1.07 04/18/2014 1544   CALCIUM 9.5 04/18/2014 1544     ASSESSMENT AND PLAN  The leading suspected etiology for his cardiomyopathy is multivessel coronary artery disease, not withstanding the absence of angina pectoris. If his cardiomyopathy secondary to coronary disease  it is likely to require bypass surgery. He is willing to undergo open heart surgery if this is recommended.  I've recommended that he undergo right and left heart catheterization. We have not yet started treatment with diuretics or ACE inhibitors in anticipation of the contrast phase procedure. Similarly will delay the initiation of beta blocker therapy until we have direct measurement of his hemodynamics.  The risks and benefits of right left heart catheterization and possible percutaneous revascularization with angioplasty and stent placement were discussed in detail with the patient and his family. He wishes to proceed as soon as possible.  Orders Placed This Encounter  Procedures  . CBC  . Basic metabolic panel  . Protime-INR  . APTT  . TSH  . LEFT AND RIGHT HEART CATHETERIZATION WITH CORONARY ANGIOGRAM   Meds ordered this encounter  Medications  . DISCONTD: Vitamin D, Ergocalciferol, (DRISDOL) 50000 UNITS CAPS capsule    Sig: Take 50,000 Units by mouth every 7 (seven) days.    Leela Vanbrocklin  Sanda Klein, MD, [facility] [phone] office [phone] pager

## 2014-04-21 ENCOUNTER — Other Ambulatory Visit (HOSPITAL_COMMUNITY): Payer: Self-pay | Admitting: Respiratory Therapy

## 2014-04-21 ENCOUNTER — Inpatient Hospital Stay (HOSPITAL_COMMUNITY): Payer: Medicare Other

## 2014-04-21 ENCOUNTER — Inpatient Hospital Stay (HOSPITAL_COMMUNITY)
Admission: RE | Admit: 2014-04-21 | Discharge: 2014-04-30 | DRG: 234 | Disposition: A | Discharge status: Home / Self Care | Payer: Medicare Other | Source: Ambulatory Visit | Attending: Surgery | Admitting: Surgery

## 2014-04-21 ENCOUNTER — Other Ambulatory Visit: Payer: Self-pay | Admitting: *Deleted

## 2014-04-21 ENCOUNTER — Encounter (HOSPITAL_COMMUNITY)
Admission: RE | Disposition: A | Discharge status: Home / Self Care | Payer: Self-pay | Source: Ambulatory Visit | Attending: Surgery

## 2014-04-21 ENCOUNTER — Encounter (HOSPITAL_COMMUNITY): Payer: Self-pay | Admitting: General Practice

## 2014-04-21 DIAGNOSIS — Z79899 Other long term (current) drug therapy: Secondary | ICD-10-CM

## 2014-04-21 DIAGNOSIS — I4891 Unspecified atrial fibrillation: Secondary | ICD-10-CM | POA: Diagnosis not present

## 2014-04-21 DIAGNOSIS — I472 Ventricular tachycardia, unspecified: Secondary | ICD-10-CM | POA: Diagnosis not present

## 2014-04-21 DIAGNOSIS — K219 Gastro-esophageal reflux disease without esophagitis: Secondary | ICD-10-CM | POA: Diagnosis present

## 2014-04-21 DIAGNOSIS — I2589 Other forms of chronic ischemic heart disease: Secondary | ICD-10-CM | POA: Diagnosis present

## 2014-04-21 DIAGNOSIS — I2510 Atherosclerotic heart disease of native coronary artery without angina pectoris: Principal | ICD-10-CM

## 2014-04-21 DIAGNOSIS — D62 Acute posthemorrhagic anemia: Secondary | ICD-10-CM | POA: Diagnosis not present

## 2014-04-21 DIAGNOSIS — E119 Type 2 diabetes mellitus without complications: Secondary | ICD-10-CM | POA: Diagnosis present

## 2014-04-21 DIAGNOSIS — I200 Unstable angina: Secondary | ICD-10-CM | POA: Diagnosis present

## 2014-04-21 DIAGNOSIS — E8779 Other fluid overload: Secondary | ICD-10-CM | POA: Diagnosis not present

## 2014-04-21 DIAGNOSIS — I5020 Unspecified systolic (congestive) heart failure: Secondary | ICD-10-CM | POA: Diagnosis present

## 2014-04-21 DIAGNOSIS — I4949 Other premature depolarization: Secondary | ICD-10-CM | POA: Diagnosis not present

## 2014-04-21 DIAGNOSIS — I255 Ischemic cardiomyopathy: Secondary | ICD-10-CM

## 2014-04-21 DIAGNOSIS — I4729 Other ventricular tachycardia: Secondary | ICD-10-CM | POA: Diagnosis not present

## 2014-04-21 DIAGNOSIS — D45 Polycythemia vera: Secondary | ICD-10-CM | POA: Diagnosis present

## 2014-04-21 DIAGNOSIS — Z951 Presence of aortocoronary bypass graft: Secondary | ICD-10-CM

## 2014-04-21 DIAGNOSIS — I509 Heart failure, unspecified: Secondary | ICD-10-CM

## 2014-04-21 HISTORY — DX: Atherosclerotic heart disease of native coronary artery without angina pectoris: I25.10

## 2014-04-21 HISTORY — PX: LEFT AND RIGHT HEART CATHETERIZATION WITH CORONARY ANGIOGRAM: SHX5449

## 2014-04-21 LAB — POCT I-STAT 3, ART BLOOD GAS (G3+)
Acid-base deficit: 4 mmol/L — ABNORMAL HIGH (ref 0.0–2.0)
Bicarbonate: 20 mEq/L (ref 20.0–24.0)
O2 SAT: 98 %
PO2 ART: 108 mmHg — AB (ref 80.0–100.0)
TCO2: 21 mmol/L (ref 0–100)
pCO2 arterial: 32.4 mmHg — ABNORMAL LOW (ref 35.0–45.0)
pH, Arterial: 7.399 (ref 7.350–7.450)

## 2014-04-21 LAB — POCT I-STAT 3, VENOUS BLOOD GAS (G3P V)
ACID-BASE DEFICIT: 4 mmol/L — AB (ref 0.0–2.0)
BICARBONATE: 21.1 meq/L (ref 20.0–24.0)
O2 SAT: 69 %
PO2 VEN: 37 mmHg (ref 30.0–45.0)
TCO2: 22 mmol/L (ref 0–100)
pCO2, Ven: 37.7 mmHg — ABNORMAL LOW (ref 45.0–50.0)
pH, Ven: 7.357 — ABNORMAL HIGH (ref 7.250–7.300)

## 2014-04-21 SURGERY — LEFT AND RIGHT HEART CATHETERIZATION WITH CORONARY ANGIOGRAM
Anesthesia: LOCAL

## 2014-04-21 MED ORDER — ASPIRIN 81 MG PO CHEW
CHEWABLE_TABLET | ORAL | Status: AC
  Filled 2014-04-21: qty 1

## 2014-04-21 MED ORDER — ATORVASTATIN CALCIUM 80 MG PO TABS
80.0000 mg | ORAL_TABLET | Freq: Every day | ORAL | Status: DC
  Administered 2014-04-22 – 2014-04-29 (×6): 80 mg via ORAL
  Filled 2014-04-21 (×10): qty 1

## 2014-04-21 MED ORDER — SODIUM CHLORIDE 0.9 % IV SOLN
INTRAVENOUS | Status: DC
  Administered 2014-04-21: 12:00:00 via INTRAVENOUS

## 2014-04-21 MED ORDER — LIDOCAINE HCL (PF) 1 % IJ SOLN
INTRAMUSCULAR | Status: AC
  Filled 2014-04-21: qty 30

## 2014-04-21 MED ORDER — HEPARIN (PORCINE) IN NACL 2-0.9 UNIT/ML-% IJ SOLN
INTRAMUSCULAR | Status: AC
  Filled 2014-04-21: qty 1000

## 2014-04-21 MED ORDER — SODIUM CHLORIDE 0.9 % IJ SOLN: 3.0000 mL | INTRAMUSCULAR | Status: DC | PRN

## 2014-04-21 MED ORDER — ASPIRIN 81 MG PO CHEW
81.0000 mg | CHEWABLE_TABLET | ORAL | Status: AC
  Administered 2014-04-21: 81 mg via ORAL

## 2014-04-21 MED ORDER — NITROGLYCERIN 0.2 MG/ML ON CALL CATH LAB
INTRAVENOUS | Status: AC
  Filled 2014-04-21: qty 1

## 2014-04-21 MED ORDER — ASPIRIN 81 MG PO CHEW
81.0000 mg | CHEWABLE_TABLET | Freq: Every day | ORAL | Status: DC
  Administered 2014-04-22 – 2014-04-23 (×2): 81 mg via ORAL
  Filled 2014-04-21 (×2): qty 1

## 2014-04-21 MED ORDER — FENTANYL CITRATE 0.05 MG/ML IJ SOLN
INTRAMUSCULAR | Status: AC
  Filled 2014-04-21: qty 2

## 2014-04-21 MED ORDER — MIDAZOLAM HCL 2 MG/2ML IJ SOLN
INTRAMUSCULAR | Status: AC
  Filled 2014-04-21: qty 2

## 2014-04-21 MED ORDER — SODIUM CHLORIDE 0.9 % IV SOLN
INTRAVENOUS | Status: DC
  Administered 2014-04-21: 16:00:00 via INTRAVENOUS

## 2014-04-21 MED ORDER — PANTOPRAZOLE SODIUM 40 MG PO TBEC
40.0000 mg | DELAYED_RELEASE_TABLET | Freq: Every day | ORAL | Status: DC
  Administered 2014-04-21 – 2014-04-23 (×3): 40 mg via ORAL
  Filled 2014-04-21 (×4): qty 1

## 2014-04-21 NOTE — Consult Note (Signed)
[address]       [place] [address]             [phone]      Cardiothoracic Surgery Consultation  Reason for Consult: Severe multi-vessel coronary disease with moderate LV dysfunction Referring Physician:  Dr. Lauree Chandler  Bruce Green is an 80 y.o. male.  HPI:   The patient is a fairly healthy, active and independent 80 year old gentleman who has no prior cardiac history but presents with a several month history of progressive exertional fatigue and shortness of breath. This has limited his activity over this period of time and he can't work in his yard for more than 15 minutes without having to take a break. He has had no chest discomfort. He had a CT scan of the chest to rule out PE which showed none but showed cardiomegaly and significant coronary calcification. He had an echo showed a reduced EF of 25-30% with global hypokinesis, mild AI, mild MR. Cardiac cath today shows 50-60% ostial LM and severe 3-vessel CAD with 99% mid LAD, 70% OM1, 99% OM2, and 100% occlusion of a large dominant RCA with bridging collaterals. PA was 33/7 with a PCWP of 12 and CI 2.4. PA sat 69%. ECG shows sinus rhythm with preserved R-waves.  Past Medical History  Diagnosis Date  . Stricture esophagus     distal  . GERD (gastroesophageal reflux disease)   . Hiatal hernia     6cm    Past Surgical History  Procedure Laterality Date  . Cholecystectomy    . Shoulder surgery    . Appendectomy    . Esophagogastroduodenoscopy  04/09/2012    Procedure: ESOPHAGOGASTRODUODENOSCOPY (EGD);  Surgeon: Gatha Mayer, MD;  Location: [facility];  Service: Endoscopy;  Laterality: N/A;    Family History  Problem Relation Age of Onset  . Heart disease Mother   . Kidney disease Father     Social History:  reports that he has never smoked. He has never used smokeless tobacco. He reports that he drinks about .5 ounces of alcohol per week. He reports that he does not use  illicit drugs.  Allergies:  Allergies  Allergen Reactions  . Sulfa Antibiotics Rash    Medications:  I have reviewed the patient's current medications. Prior to Admission:  Prescriptions prior to admission  Medication Sig Dispense Refill  . B Complex-C-Calcium (B-COMPLEX/VITAMIN C PO) Take 1 tablet by mouth daily.      Marland Kitchen CALCIUM PO Take 1 tablet by mouth daily.      . naproxen sodium (ANAPROX) 220 MG tablet Take 220 mg by mouth daily.      . pantoprazole (PROTONIX) 40 MG tablet Take 40 mg by mouth daily.       Scheduled: . aspirin       Continuous:  PRN: Anti-infectives   None      No results found for this or any previous visit (from the past 48 hour(s)).  No results found.  Review of Systems  Constitutional: Negative for fever, chills, weight loss, malaise/fatigue and diaphoresis.  HENT: Negative.   Eyes: Negative.   Respiratory: Negative.   Cardiovascular: Negative for chest pain, palpitations, orthopnea, leg swelling and PND.       Exertional fatigue and shortness of breath  Gastrointestinal:       Distal esophageal stricture and history of dysphagia in the past requiring dilatation. Previous endoscopy notes show that the stricture was mild.  Genitourinary: Negative.   Musculoskeletal: Negative.   Skin: Negative.   Neurological: Negative.   Endo/Heme/Allergies: Negative.   Psychiatric/Behavioral: Negative.    Blood pressure 133/78, pulse 87, temperature 97.6 F (36.4 C), temperature source Oral, resp. rate 18, height 5\' 10"  (1.778 m), weight 72.576 kg (160 lb), SpO2 94.00%. Physical Exam  Constitutional: He is oriented to person, place, and time.  Well-developed gentleman who looks younger than his stated age.  HENT:  Head: Normocephalic and atraumatic.  Mouth/Throat: Oropharynx is clear and moist.  Eyes: EOM are normal. Pupils are equal, round, and reactive to light.  Neck: Normal range of motion. Neck supple. No JVD present. No thyromegaly present.    Cardiovascular: Normal rate, regular rhythm, normal heart sounds and intact distal pulses.   No murmur heard. Respiratory: Effort normal and breath sounds normal. No respiratory distress. He has no rales. He exhibits no tenderness.  GI: Soft. Bowel sounds are normal. He exhibits no distension and no mass. There is no tenderness.  Musculoskeletal: Normal range of motion. He exhibits no edema and no tenderness.  Lymphadenopathy:    He has no cervical adenopathy.  Neurological: He is alert and oriented to person, place, and time. No cranial nerve deficit.  Skin: Skin is warm and dry.  Psychiatric: He has a normal mood and affect.   *[facility], [place], [place] [address] [phone]  ------------------------------------------------------------------- Transthoracic Echocardiography  Patient: Bruce Green, Bruce Green MR #: [id number] Study Date: 04/12/2014 Gender: M Age: 66 Height: 177.8 cm Weight: 72.6 kg BSA: 1.89 m^2 Pt. Status: Room:  [place] REFERRING Migdalia Dk, MD [place], MD SONOGRAPHER Marygrace Drought, RCS PERFORMING [facility], Outpatient  cc:  ------------------------------------------------------------------- LV EF: 25% - 30%  ------------------------------------------------------------------- Indications: 786.05 Dyspnea.  ------------------------------------------------------------------- History: PMH: Cardiomegaly. Murmur. Cardiomegaly.  ------------------------------------------------------------------- Study Conclusions  - Left ventricle: There was mild concentric hypertrophy. Systolic function was severely reduced. The estimated ejection fraction was in the range of 25% to 30%. Global hypokinesis. Doppler parameters are consistent with abnormal left ventricular relaxation (grade 1 diastolic dysfunction). - Aortic valve: Trileaflet. Sclerosis without  stenosis. There was mild, posteriorly directed regurgitation. - Aorta: The aortic root is borderline enlarged. Aortic root dimension: 3.9 cm (ED). - Mitral valve: Moderately thickened leaflets, particularly at the leaflet tips. There is reduced anterior leaflet excursion due to posteriorly directed AI. There is mild mitral regurgitation. - Left atrium: LA volume/ BSA = 42.1 ml/m2. Moderately dilated. - Atrial septum: No defect or patent foramen ovale was identified.  Impressions:  - EF 25-30%, global hypokinesis. Mild AI, mild MR, thickened mitral valve leaflets. Moderately dilated LA.  -------------------------------------------------------------------  ------------------------------------------------------------------- Left ventricle: There was mild concentric hypertrophy. Systolic function was severely reduced. The estimated ejection fraction was in the range of 25% to 30%. Global hypokinesis. Doppler parameters are consistent with abnormal left ventricular relaxation (grade 1 diastolic dysfunction). The E/e&' ratio is <8, suggesting normal LV filling pressure.  ------------------------------------------------------------------- Aortic valve: Trileaflet. Sclerosis without stenosis. Doppler: There was mild, posteriorly directed regurgitation.  ------------------------------------------------------------------- Aorta: The aortic root is borderline enlarged.  ------------------------------------------------------------------- Mitral valve: Moderately thickened leaflets, particularly at the leaflet tips. There is reduced anterior leaflet excursion due to posteriorly directed AI. There is mild mitral regurgitation.  ------------------------------------------------------------------- Left atrium: LA volume/ BSA = 42.1 ml/m2. Moderately dilated.  ------------------------------------------------------------------- Atrial septum: No defect or patent foramen ovale was  identified.  ------------------------------------------------------------------- Right ventricle: The cavity size was normal. The moderator band was prominent.  Low normal systolic function.  ------------------------------------------------------------------- Pulmonic valve: The valve appears to be grossly normal. Doppler: There was no significant regurgitation.  ------------------------------------------------------------------- Tricuspid valve: Poorly visualized. Doppler: There was no significant regurgitation.  ------------------------------------------------------------------- Pulmonary artery: The main pulmonary artery was normal-sized.  ------------------------------------------------------------------- Right atrium: The atrium was at the upper limits of normal in size.  ------------------------------------------------------------------- Pericardium: There was no pericardial effusion.  ------------------------------------------------------------------- Systemic veins: Inferior vena cava: The vessel was normal in size. The respirophasic diameter changes were in the normal range (= 50%), consistent with normal central venous pressure. Diameter: 16 mm.  ------------------------------------------------------------------- Post procedure conclusions Ascending Aorta:  - The aortic root is borderline enlarged.  ------------------------------------------------------------------- Prepared and Electronically Authenticated by  Lyman Bishop MD 2015-05-20T12:09:07   Cardiac Catheterization Operative Report  Bruce Green  [id number]  5/29/20151:45 PM  Melinda Crutch, MD  Procedure Performed:  1. Left Heart Catheterization 2. Selective Coronary Angiography 3. Right Heart Catheterization Operator: Lauree Chandler, MD  Indication: 80 yo male with newly diagnosed LV systolic dysfunction, fatigue and SOB referred for right and left heart cath.  Procedure Details:  The  risks, benefits, complications, treatment options, and expected outcomes were discussed with the patient. The patient and/or family concurred with the proposed plan, giving informed consent. The patient was brought to the cath lab after IV hydration was begun and oral premedication was given. The patient was further sedated with Versed and Fentanyl. The right groin was prepped and draped in the usual manner. Using the modified Seldinger access technique, a 5 French sheath was placed in the right femoral artery. A 7 French sheath was inserted into the right femoral vein. A balloon tipped catheter was used to perform a right heart catheterization. Standard diagnostic catheters were used to perform selective coronary angiography. A pigtail catheter was used to cross the aortic valve and measure LV pressures. There were no immediate complications. The patient was taken to the recovery area in stable condition.  Hemodynamic Findings:  Ao: 158/79  LV: 153/8/15  RA: 3  RV: 32/1/3  PA: 33/7 (mean 18)  PCWP: 12  Fick Cardiac Output: 4.58 L/min  Fick Cardiac Index: 2.4 L/min/m2  Central Aortic Saturation: 98%  Pulmonary Artery Saturation: 69%  Angiographic Findings:  Left main: Ostial 50-60% stenosis.  Left Anterior Descending Artery: Large caliber vessel that courses to the apex. The proximal and mid segments are heavily calcified. The proximal vessel has an eccentric, calcified 80% stenosis. The mid vessel is calcified with diffuse 60% stenosis followed by focal 99% stenosis. The distal vessel has diffuse 20-30% stenosis. The diagonal branch is small to moderate in caliber with proximal 80% stenosis.  Circumflex Artery: Large caliber vessel with two moderate caliber obtuse marginal branches. The AV groove Circumflex has mild diffuse plaque. The first OM is a moderate caliber vessel with ostial 70% stenosis. The second OM branch is a moderate caliber vessel with proximal 99% stenosis, long 99% stenosis mid  segment.  Right Coronary Artery: Large dominant vessel with diffuse 40% proximal stenosis. The mid vessel has 100% sub-total occlusion. The distal vessel fills from bridging collaterals. The distal vessel has diffuse 40% stenosis. A distal branch fills from left to right collaterals.  Left Ventricular Angiogram: Deferred.  Impression:  1. Severe triple vessel CAD  2. Severe LV dysfunction by echo  Recommendations: He is a very functional 80 yo male. He has severe triple vessel CAD with good distal targets. His LAD is heavily calcified with severe disease. The RCA is chronically occluded. Both  obtuse marginal branches have severe proximal disease. As outlined in the office note, Dr. Recardo Evangelist feels that he should at least be considered for bypass surgery. He has severe LV systolic dysfunction by echo. Will admit to telemetry and ask CT surgery to see him. If he is not felt to be a surgical candidate given advanced age, will consider medical management vs high risk PCI with rotablator of LAD.  Complications: None; patient tolerated the procedure well.     CLINICAL DATA: Shortness of breath with exacerbation over the past  3 months. Nodular density on recent chest x-ray. Elevated D-dimer.  Esophageal stricture.  EXAM:  CT ANGIOGRAPHY CHEST WITH CONTRAST  TECHNIQUE:  Multidetector CT imaging of the chest was performed using the  standard protocol during bolus administration of intravenous  contrast. Multiplanar CT image reconstructions and MIPs were  obtained to evaluate the vascular anatomy.  CONTRAST: 133mL OMNIPAQUE IOHEXOL 350 MG/ML SOLN  COMPARISON: 04/05/2014 chest x-ray.  FINDINGS:  Scattered pulmonary parenchymal changes suggestive of scarring/  subsegmental atelectasis. No worrisome lung mass identified. What  was noted on chest x-ray may have represented complication shadows.  Incidentally noted is what appears to be a small lymph node in the  left major fissure.  No pulmonary  embolus detected.  Cardiomegaly with left ventricular prominence.  Coronary artery calcifications.  Atherosclerotic type changes of the thoracic aorta with prominent  irregular plaque most notable descending thoracic aorta which is  ectatic.  Small hiatal hernia.  No mediastinal or hilar adenopathy.  New bony destructive lesion.  Visualized upper abdominal structures unremarkable.  Review of the MIP images confirms the above findings.  IMPRESSION:  Scattered pulmonary parenchymal changes suggestive of scarring/  subsegmental atelectasis. No worrisome lung mass identified. What  was noted on chest x-ray may have represented complication shadows.  No pulmonary embolus detected.  Cardiomegaly with left ventricular prominence.  Coronary artery calcifications.  Atherosclerotic type changes of the thoracic aorta with prominent  irregular plaque most notable descending thoracic aorta which is  ectatic.  Small hiatal hernia.  This is a call report.  Electronically Signed  By: Chauncey Cruel M.D.  On: 04/06/2014 13:34    Assessment/Plan:  He has significant ostial LM and severe 3-vessel coronary disease with moderate LV dysfunction presenting with progressive exertional fatigue and shortness of breath. He is 89 but in good condition and still quite active and independent and I think CABG is the best treatment to improve his symptoms and prevent further deterioration in LV function. He is at increased risk due to his age but his risk of surgery is still much lower than his risk with medical therapy and probably lower than his risk with a high risk PCI. I discussed the operative procedure with the patient and his 2 daughters including alternatives, benefits and risks; including but not limited to bleeding, blood transfusion, infection, stroke, myocardial infarction, graft failure, heart block requiring a permanent pacemaker, organ dysfunction, and death.  Bruce Green understands and agrees to  proceed.  We will schedule surgery for  Monday am. He should remain in the hospital until surgery.  Bruce Green 04/21/2014, 4:03 PM

## 2014-04-21 NOTE — Progress Notes (Signed)
1530 report received from Tammy,RN cardiac cath 1615 pt transferred in from cardiac ncath via bed wit staff RN and Nt

## 2014-04-21 NOTE — H&P (View-Only) (Signed)
Patient ID: Bruce Green, male   DOB: 09/21/1919, 80 y.o.   MRN: [id number]     Reason for office visit Dyspnea on exertion, followup echocardiogram  Bruce Green returns in followup after undergoing an echocardiogram for recent onset exertional dyspnea. He is a very active and mentally well preserved [employer] War [employer] veteran who is very frustrated with the fact that he can no longer be physically active. He again denies angina pectoris.  His echocardiogram showed the surprising finding of moderately severe depressed left ventricular systolic function with a global pattern. Extensive coronary artery calcification was noted on recent CT angiogram of the chest performed to exclude pulmonary embolism.  He is otherwise in good shape. He has polycythemia vera that has never required chemotherapy or phlebotomy. He has occasional urinary infections and a remote history of seizures for which he no longer takes medications. He has never smoked and does not have diabetes mellitus.  ECHO 04/12/2014 - Left ventricle: There was mild concentric hypertrophy. Systolic function was severely reduced. The estimated ejection fraction was in the range of 25% to 30%. Global hypokinesis. Doppler parameters are consistent with abnormal left ventricular relaxation (grade 1 diastolic dysfunction). - Aortic valve: Trileaflet. Sclerosis without stenosis. There was mild, posteriorly directed regurgitation. - Aorta: The aortic root is borderline enlarged. Aortic root dimension: 3.9 cm (ED). - Mitral valve: Moderately thickened leaflets, particularly at the leaflet tips. There is reduced anterior leaflet excursion due to posteriorly directed AI. There is mild mitral regurgitation. - Left atrium: LA volume/ BSA = 42.1 ml/m2. Moderately dilated. - Atrial septum: No defect or patent foramen ovale was identified.    Allergies  Allergen Reactions  . Sulfa Antibiotics Rash    Current Outpatient Prescriptions  Medication  Sig Dispense Refill  . B Complex-C-Calcium (B-COMPLEX/VITAMIN C PO) Take 1 tablet by mouth daily.      Marland Kitchen CALCIUM PO Take 1 tablet by mouth daily.      . naproxen sodium (ANAPROX) 220 MG tablet Take 220 mg by mouth daily.      . pantoprazole (PROTONIX) 40 MG tablet Take 40 mg by mouth daily.       No current facility-administered medications for this visit.    Past Medical History  Diagnosis Date  . Stricture esophagus     distal  . GERD (gastroesophageal reflux disease)   . Hiatal hernia     6cm    Past Surgical History  Procedure Laterality Date  . Cholecystectomy    . Shoulder surgery    . Appendectomy    . Esophagogastroduodenoscopy  04/09/2012    Procedure: ESOPHAGOGASTRODUODENOSCOPY (EGD);  Surgeon: Gatha Mayer, MD;  Location: [facility];  Service: Endoscopy;  Laterality: N/A;    Family History  Problem Relation Age of Onset  . Heart disease Mother   . Kidney disease Father     History   Social History  . Marital Status: Widowed    Spouse Name: N/A    Number of Children: N/A  . Years of Education: N/A   Occupational History  . retired    Social History Main Topics  . Smoking status: Never Smoker   . Smokeless tobacco: Never Used  . Alcohol Use: 0.5 oz/week    1 drink(s) per week     Comment: rare  . Drug Use: No  . Sexual Activity: Not on file   Other Topics Concern  . Not on file   Social History Narrative   Retired Conservation officer, historic buildings. Widowed.  Review of systems: The patient specifically denies any chest pain at rest or with exertion, dyspnea at rest, orthopnea, paroxysmal nocturnal dyspnea, syncope, palpitations, focal neurological deficits, intermittent claudication, lower extremity edema, unexplained weight gain, cough, hemoptysis or wheezing.  The patient also denies abdominal pain, nausea, vomiting, dysphagia, diarrhea, constipation, polyuria, polydipsia, dysuria, hematuria, frequency, urgency, abnormal bleeding or bruising, fever,  chills, unexpected weight changes, mood swings, change in skin or hair texture, change in voice quality, auditory or visual problems, allergic reactions or rashes, new musculoskeletal complaints other than usual "aches and pains".   PHYSICAL EXAM BP 120/64  Pulse 74  Ht 5\' 10"  (1.778 m)  Wt 160 lb 8 oz (72.802 kg)  BMI 23.03 kg/m2 General: Alert, oriented x3, no distress  Head: no evidence of trauma, PERRL, EOMI, no exophtalmos or lid lag, no myxedema, no xanthelasma; normal ears, nose and oropharynx  Neck: normal jugular venous pulsations and no hepatojugular reflux; brisk carotid pulses without delay and no carotid bruits  Chest: clear to auscultation, no signs of consolidation by percussion or palpation, normal fremitus, symmetrical and full respiratory excursions  Cardiovascular: normal position and quality of the apical impulse, regular rhythm, normal first and second heart sounds, no rubs or gallops, 7-5/6 holosystolic apical murmur  Abdomen: no tenderness or distention, no masses by palpation, no abnormal pulsatility or arterial bruits, normal bowel sounds, no hepatosplenomegaly  Extremities: no clubbing, cyanosis or edema; 2+ radial, ulnar and brachial pulses bilaterally; 2+ right femoral, posterior tibial and dorsalis pedis pulses; 2+ left femoral, posterior tibial and dorsalis pedis pulses; no subclavian or femoral bruits  Neurological: grossly nonfocal  EKG: NSR, frequent PACs and PVCs, nonspecific ST-T changes   BMET    Component Value Date/Time   NA 141 04/18/2014 1544   K 4.6 04/18/2014 1544   CL 107 04/18/2014 1544   CO2 27 04/18/2014 1544   GLUCOSE 131* 04/18/2014 1544   BUN 29* 04/18/2014 1544   CREATININE 1.07 04/18/2014 1544   CALCIUM 9.5 04/18/2014 1544     ASSESSMENT AND PLAN  The leading suspected etiology for his cardiomyopathy is multivessel coronary artery disease, not withstanding the absence of angina pectoris. If his cardiomyopathy secondary to coronary disease  it is likely to require bypass surgery. He is willing to undergo open heart surgery if this is recommended.  I've recommended that he undergo right and left heart catheterization. We have not yet started treatment with diuretics or ACE inhibitors in anticipation of the contrast phase procedure. Similarly will delay the initiation of beta blocker therapy until we have direct measurement of his hemodynamics.  The risks and benefits of right left heart catheterization and possible percutaneous revascularization with angioplasty and stent placement were discussed in detail with the patient and his family. He wishes to proceed as soon as possible.  Orders Placed This Encounter  Procedures  . CBC  . Basic metabolic panel  . Protime-INR  . APTT  . TSH  . LEFT AND RIGHT HEART CATHETERIZATION WITH CORONARY ANGIOGRAM   Meds ordered this encounter  Medications  . DISCONTD: Vitamin D, Ergocalciferol, (DRISDOL) 50000 UNITS CAPS capsule    Sig: Take 50,000 Units by mouth every 7 (seven) days.    Katheleen Stella  Sanda Klein, MD, [facility] [phone] office [phone] pager

## 2014-04-21 NOTE — Interval H&P Note (Signed)
History and Physical Interval Note:  04/21/2014 1:06 PM  Bruce Green  has presented today for cardiac cath with the diagnosis of CHF,systolic CHF. The various methods of treatment have been discussed with the patient and family. After consideration of risks, benefits and other options for treatment, the patient has consented to  Procedure(s): LEFT AND RIGHT HEART CATHETERIZATION WITH CORONARY ANGIOGRAM (N/A) as a surgical intervention .  The patient's history has been reviewed, patient examined, no change in status, stable for surgery.  I have reviewed the patient's chart and labs.  Questions were answered to the patient's satisfaction.    Cath Lab Visit (complete for each Cath Lab visit)  Clinical Evaluation Leading to the Procedure:   ACS: no  Non-ACS:    Anginal Classification: CCS III  Anti-ischemic medical therapy: No Therapy  Non-Invasive Test Results: No non-invasive testing performed  Prior CABG: No previous CABG        Burnell Blanks

## 2014-04-21 NOTE — CV Procedure (Signed)
Cardiac Catheterization Operative Report  Bruce Green [id number] 5/29/20151:45 PM  Melinda Crutch, MD  Procedure Performed:  1. Left Heart Catheterization 2. Selective Coronary Angiography 3. Right Heart Catheterization  Operator: Lauree Chandler, MD  Indication: 80 yo male with newly diagnosed LV systolic dysfunction, fatigue and SOB referred for right and left heart cath.                               Procedure Details: The risks, benefits, complications, treatment options, and expected outcomes were discussed with the patient. The patient and/or family concurred with the proposed plan, giving informed consent. The patient was brought to the cath lab after IV hydration was begun and oral premedication was given. The patient was further sedated with Versed and Fentanyl. The right groin was prepped and draped in the usual manner. Using the modified Seldinger access technique, a 5 French sheath was placed in the right femoral artery. A 7 French sheath was inserted into the right femoral vein. A balloon tipped catheter was used to perform a right heart catheterization. Standard diagnostic catheters were used to perform selective coronary angiography. A pigtail catheter was used to cross the aortic valve and measure LV pressures. There were no immediate complications. The patient was taken to the recovery area in stable condition.   Hemodynamic Findings: Ao: 158/79             LV: 153/8/15 RA: 3             RV: 32/1/3 PA: 33/7 (mean 18)    PCWP: 12 Fick Cardiac Output: 4.58 L/min Fick Cardiac Index: 2.4 L/min/m2 Central Aortic Saturation: 98% Pulmonary Artery Saturation: 69%  Angiographic Findings:  Left main: Ostial 50-60% stenosis.   Left Anterior Descending Artery: Large caliber vessel that courses to the apex. The proximal and mid segments are heavily calcified. The proximal vessel has an eccentric, calcified 80% stenosis. The mid vessel is calcified with diffuse 60%  stenosis followed by focal 99% stenosis. The distal vessel has diffuse 20-30% stenosis. The diagonal branch is small to moderate in caliber with proximal 80% stenosis.   Circumflex Artery: Large caliber vessel with two moderate caliber obtuse marginal branches. The AV groove Circumflex has mild diffuse plaque. The first OM is a moderate caliber vessel with ostial 70% stenosis. The second OM branch is a moderate caliber vessel with proximal 99% stenosis, long 99% stenosis mid segment.   Right Coronary Artery: Large dominant vessel with diffuse 40% proximal stenosis. The mid vessel has 100% sub-total occlusion. The distal vessel fills from bridging collaterals. The distal vessel has diffuse 40% stenosis. A distal branch fills from left to right collaterals.   Left Ventricular Angiogram: Deferred.   Impression: 1. Severe triple vessel CAD 2. Severe LV dysfunction by echo  Recommendations: He is a very functional 80 yo male. He has severe triple vessel CAD with good distal targets. His LAD is heavily calcified with severe disease. The RCA is chronically occluded. Both obtuse marginal branches have severe proximal disease. As outlined in the office note, Dr. Recardo Evangelist feels that he should at least be considered for bypass surgery. He has severe LV systolic dysfunction by echo. Will admit to telemetry and ask CT surgery to see him. If he is not felt to be a surgical candidate given advanced age, will consider medical management vs high risk PCI with rotablator of LAD.  Complications:  None; patient tolerated the procedure well.

## 2014-04-22 DIAGNOSIS — I2510 Atherosclerotic heart disease of native coronary artery without angina pectoris: Principal | ICD-10-CM

## 2014-04-22 DIAGNOSIS — I509 Heart failure, unspecified: Secondary | ICD-10-CM

## 2014-04-22 DIAGNOSIS — Z01810 Encounter for preprocedural cardiovascular examination: Secondary | ICD-10-CM

## 2014-04-22 DIAGNOSIS — I5020 Unspecified systolic (congestive) heart failure: Secondary | ICD-10-CM

## 2014-04-22 MED ORDER — DOCUSATE SODIUM 100 MG PO CAPS
100.0000 mg | ORAL_CAPSULE | Freq: Every day | ORAL | Status: DC
  Administered 2014-04-22 – 2014-04-23 (×2): 100 mg via ORAL
  Filled 2014-04-22 (×3): qty 1

## 2014-04-22 MED ORDER — ALPRAZOLAM 0.5 MG PO TABS
0.5000 mg | ORAL_TABLET | Freq: Once | ORAL | Status: AC
  Administered 2014-04-22: 0.5 mg via ORAL
  Filled 2014-04-22: qty 1

## 2014-04-22 NOTE — Progress Notes (Signed)
CARDIAC REHAB PHASE I   PRE:  Rate/Rhythm: 79 NSR with PACs  BP:  Sitting: 130/76      SaO2: 98 RA  MODE:  Ambulation: 460 ft   POST:  Rate/Rhythm: 86 NSR with PACs  BP:  Sitting: 156/80     SaO2: 99 RA  1425-1515 Patient ambulated independently in hallway. Steady gait noted. Patient denied CP or SOB during walk. Preop education done.Open heart surgery book and information shet given and encouraged patient and family to view. IS given and patient demonstrated correct use. Encouraged patient to use IS 10xhour while awake. Post walk patient back to bed with call bell in reach. Patient had previously been in chair prior to walk and requested to take a nap. Will continue to follow post-op.  Lizbet Cirrincione English PayneRN, BSN 04/22/2014 3:24 PM

## 2014-04-22 NOTE — Progress Notes (Signed)
SUBJECTIVE:  No groin bleeding.  No chest pain.  WOuld like surgery to be done today instead of waiting until Monday.  OBJECTIVE:   Vitals:   Filed Vitals:   04/21/14 2000 04/21/14 2157 04/22/14 0241 04/22/14 0557  BP: 148/57 135/84 129/67 142/77  Pulse: 75 72 71 74  Temp:  97.5 F (36.4 C) 98.1 F (36.7 C) 97.9 F (36.6 C)  TempSrc:  Oral Oral Oral  Resp: 16 18 20 20   Height:      Weight:    157 lb 10.1 oz (71.5 kg)  SpO2: 98% 97% 97% 98%   I&O's:   Intake/Output Summary (Last 24 hours) at 04/22/14 1026 Last data filed at 04/22/14 0557  Gross per 24 hour  Intake    480 ml  Output    400 ml  Net     80 ml   TELEMETRY: Reviewed telemetry pt in NSR:     PHYSICAL EXAM General: Well developed, well nourished, in no acute distress; appears much younger than stated age Head:   Normal cephalic and atramatic  Lungs:   Clear bilaterally to auscultation. Heart:   HRRR S1 S2  No JVD.   Abdomen: abdomen soft and non-tender Msk:  Back normal,  Normal strength and tone for age. Extremities:  No edema.  No groin hematoma Neuro: Alert and oriented. Psych:  Normal affect, responds appropriately   LABS: Basic Metabolic Panel: No results found for this basename: NA, K, CL, CO2, GLUCOSE, BUN, CREATININE, CALCIUM, MG, PHOS,  in the last 72 hours Liver Function Tests: No results found for this basename: AST, ALT, ALKPHOS, BILITOT, PROT, ALBUMIN,  in the last 72 hours No results found for this basename: LIPASE, AMYLASE,  in the last 72 hours CBC: No results found for this basename: WBC, NEUTROABS, HGB, HCT, MCV, PLT,  in the last 72 hours Cardiac Enzymes: No results found for this basename: CKTOTAL, CKMB, CKMBINDEX, TROPONINI,  in the last 72 hours BNP: No components found with this basename: POCBNP,  D-Dimer: No results found for this basename: DDIMER,  in the last 72 hours Hemoglobin A1C: No results found for this basename: HGBA1C,  in the last 72 hours Fasting Lipid  Panel: No results found for this basename: CHOL, HDL, LDLCALC, TRIG, CHOLHDL, LDLDIRECT,  in the last 72 hours Thyroid Function Tests: No results found for this basename: TSH, T4TOTAL, FREET3, T3FREE, THYROIDAB,  in the last 72 hours Anemia Panel: No results found for this basename: VITAMINB12, FOLATE, FERRITIN, TIBC, IRON, RETICCTPCT,  in the last 72 hours Coag Panel:   Lab Results  Component Value Date   INR 1.03 04/18/2014    RADIOLOGY: Ct Angio Chest Pe W/cm &/or Wo Cm  04/06/2014   CLINICAL DATA:  Shortness of breath with exacerbation over the past 3 months. Nodular density on recent chest x-ray. Elevated D-dimer. Esophageal stricture.  EXAM: CT ANGIOGRAPHY CHEST WITH CONTRAST  TECHNIQUE: Multidetector CT imaging of the chest was performed using the standard protocol during bolus administration of intravenous contrast. Multiplanar CT image reconstructions and MIPs were obtained to evaluate the vascular anatomy.  CONTRAST:  182mL OMNIPAQUE IOHEXOL 350 MG/ML SOLN  COMPARISON:  04/05/2014 chest x-ray.  FINDINGS: Scattered pulmonary parenchymal changes suggestive of scarring/ subsegmental atelectasis. No worrisome lung mass identified. What was noted on chest x-ray may have represented complication shadows. Incidentally noted is what appears to be a small lymph node in the left major fissure.  No pulmonary embolus detected.  Cardiomegaly with left  ventricular prominence.  Coronary artery calcifications.  Atherosclerotic type changes of the thoracic aorta with prominent irregular plaque most notable descending thoracic aorta which is ectatic.  Small hiatal hernia.  No mediastinal or hilar adenopathy.  New bony destructive lesion.  Visualized upper abdominal structures unremarkable.  Review of the MIP images confirms the above findings.  IMPRESSION: Scattered pulmonary parenchymal changes suggestive of scarring/ subsegmental atelectasis. No worrisome lung mass identified. What was noted on chest x-ray may  have represented complication shadows.  No pulmonary embolus detected.  Cardiomegaly with left ventricular prominence.  Coronary artery calcifications.  Atherosclerotic type changes of the thoracic aorta with prominent irregular plaque most notable descending thoracic aorta which is ectatic.  Small hiatal hernia.  This is a call report.   Electronically Signed   By: Chauncey Cruel M.D.   On: 04/06/2014 13:34      ASSESSMENT: PLAN:    CAD: Plan for CABG on Monday. Continue aspirin and lipitor.   Cardiomyopathy: Ischemic.  No sx of CHF at this time. Will need ACE-I and beta blocker post operatively.  WOuld not start now due to recent dye exposure and upcoming CABG.  Jettie Booze, MD  04/22/2014  10:26 AM

## 2014-04-22 NOTE — Progress Notes (Signed)
The patient's right femoral cardiac cath site remained at a level 0 overnight.  He did not have any complaints of pain and did not receive any PRN medications.  His VS are stable.

## 2014-04-22 NOTE — Progress Notes (Addendum)
Pre-op Cardiac Surgery  Carotid Findings:  Findings suggest 1-39% right internal carotid artery stenosis and upper range 1-39% left internal carotid artery stenosis. Vertebral arteries are patent with antegrade flow.  04/22/2014 4:21 PM Maudry Mayhew, RVT, RDCS, RDMS     Upper Extremity Right Left  Brachial Pressures 150 141  Radial Waveforms Bi Tri  Ulnar Waveforms Bi Tri  Palmar Arch (Allen's Test) Obliterates with radial compression, normal with ulnar compression Normal with radial and ulnar compression      Lower  Extremity Right Left  Dorsalis Pedis 107, mono 145, mono  Anterior Tibial    Posterior Tibial 130, mono 157, tri  Ankle/Brachial Indices 0.87 1.05    Landry Mellow, RDMS, RVT 04/23/2014

## 2014-04-23 DIAGNOSIS — Z01810 Encounter for preprocedural cardiovascular examination: Secondary | ICD-10-CM

## 2014-04-23 LAB — BASIC METABOLIC PANEL
BUN: 23 mg/dL (ref 6–23)
CO2: 26 mEq/L (ref 19–32)
Calcium: 9 mg/dL (ref 8.4–10.5)
Chloride: 103 mEq/L (ref 96–112)
Creatinine, Ser: 1.18 mg/dL (ref 0.50–1.35)
GFR calc Af Amer: 60 mL/min — ABNORMAL LOW (ref >90)
GFR, EST NON AFRICAN AMERICAN: 52 mL/min — AB (ref >90)
GLUCOSE: 108 mg/dL — AB (ref 70–99)
Potassium: 4.5 mEq/L (ref 3.7–5.3)
Sodium: 139 mEq/L (ref 137–147)

## 2014-04-23 LAB — TYPE AND SCREEN
ABO/RH(D): O POS
Antibody Screen: NEGATIVE

## 2014-04-23 LAB — CBC
HCT: 39.9 % (ref 39.0–52.0)
HEMOGLOBIN: 12.7 g/dL — AB (ref 13.0–17.0)
MCH: 25 pg — ABNORMAL LOW (ref 26.0–34.0)
MCHC: 31.8 g/dL (ref 30.0–36.0)
MCV: 78.7 fL (ref 78.0–100.0)
Platelets: 202 10*3/uL (ref 150–400)
RBC: 5.07 MIL/uL (ref 4.22–5.81)
RDW: 16.4 % — ABNORMAL HIGH (ref 11.5–15.5)
WBC: 9 10*3/uL (ref 4.0–10.5)

## 2014-04-23 MED ORDER — SODIUM CHLORIDE 0.9 % IV SOLN
INTRAVENOUS | Status: DC
  Filled 2014-04-23: qty 30

## 2014-04-23 MED ORDER — INSULIN REGULAR HUMAN 100 UNIT/ML IJ SOLN
INTRAMUSCULAR | Status: AC
  Administered 2014-04-24: 1.5 [IU]/h via INTRAVENOUS
  Filled 2014-04-23: qty 1

## 2014-04-23 MED ORDER — PHENYLEPHRINE HCL 10 MG/ML IJ SOLN
30.0000 ug/min | INTRAVENOUS | Status: AC
  Administered 2014-04-24: 25 ug/min via INTRAVENOUS
  Filled 2014-04-23: qty 2

## 2014-04-23 MED ORDER — PLASMA-LYTE 148 IV SOLN
INTRAVENOUS | Status: AC
  Administered 2014-04-24: 07:00:00
  Filled 2014-04-23: qty 2.5

## 2014-04-23 MED ORDER — EPINEPHRINE HCL 1 MG/ML IJ SOLN
0.5000 ug/min | INTRAVENOUS | Status: DC
  Filled 2014-04-23: qty 4

## 2014-04-23 MED ORDER — TEMAZEPAM 15 MG PO CAPS
15.0000 mg | ORAL_CAPSULE | Freq: Once | ORAL | Status: AC | PRN
  Administered 2014-04-24: 15 mg via ORAL
  Filled 2014-04-23: qty 1

## 2014-04-23 MED ORDER — DEXTROSE 5 % IV SOLN
750.0000 mg | INTRAVENOUS | Status: DC
  Filled 2014-04-23: qty 750

## 2014-04-23 MED ORDER — DEXTROSE 5 % IV SOLN
1.5000 g | INTRAVENOUS | Status: AC
  Administered 2014-04-24: 1.5 g via INTRAVENOUS
  Filled 2014-04-23: qty 1.5

## 2014-04-23 MED ORDER — NITROGLYCERIN IN D5W 200-5 MCG/ML-% IV SOLN
2.0000 ug/min | INTRAVENOUS | Status: AC
  Administered 2014-04-24: 16.67 ug/min via INTRAVENOUS
  Filled 2014-04-23: qty 250

## 2014-04-23 MED ORDER — DEXMEDETOMIDINE HCL IN NACL 400 MCG/100ML IV SOLN
0.1000 ug/kg/h | INTRAVENOUS | Status: AC
  Administered 2014-04-24: 0.2 ug/kg/h via INTRAVENOUS
  Filled 2014-04-23 (×2): qty 100

## 2014-04-23 MED ORDER — POTASSIUM CHLORIDE 2 MEQ/ML IV SOLN
80.0000 meq | INTRAVENOUS | Status: DC
  Filled 2014-04-23: qty 40

## 2014-04-23 MED ORDER — VANCOMYCIN HCL 10 G IV SOLR
1250.0000 mg | INTRAVENOUS | Status: AC
  Administered 2014-04-24: 1250 mg via INTRAVENOUS
  Filled 2014-04-23 (×2): qty 1250

## 2014-04-23 MED ORDER — CHLORHEXIDINE GLUCONATE CLOTH 2 % EX PADS
6.0000 | MEDICATED_PAD | Freq: Once | CUTANEOUS | Status: AC
  Administered 2014-04-23: 6 via TOPICAL

## 2014-04-23 MED ORDER — MAGNESIUM SULFATE 50 % IJ SOLN
40.0000 meq | INTRAMUSCULAR | Status: DC
  Filled 2014-04-23: qty 10

## 2014-04-23 MED ORDER — CHLORHEXIDINE GLUCONATE CLOTH 2 % EX PADS: 6.0000 | MEDICATED_PAD | Freq: Once | CUTANEOUS | Status: DC

## 2014-04-23 MED ORDER — DIAZEPAM 2 MG PO TABS: 2.0000 mg | ORAL_TABLET | Freq: Once | ORAL | Status: DC

## 2014-04-23 MED ORDER — METOPROLOL TARTRATE 12.5 MG HALF TABLET
12.5000 mg | ORAL_TABLET | Freq: Once | ORAL | Status: AC
  Administered 2014-04-24: 12.5 mg via ORAL
  Filled 2014-04-23: qty 1

## 2014-04-23 MED ORDER — DOPAMINE-DEXTROSE 3.2-5 MG/ML-% IV SOLN
2.0000 ug/kg/min | INTRAVENOUS | Status: DC
  Filled 2014-04-23 (×2): qty 250

## 2014-04-23 MED ORDER — BISACODYL 5 MG PO TBEC: 5.0000 mg | DELAYED_RELEASE_TABLET | Freq: Once | ORAL | Status: DC

## 2014-04-23 MED ORDER — SODIUM CHLORIDE 0.9 % IV SOLN
INTRAVENOUS | Status: AC
  Administered 2014-04-24: 69.8 mL/h via INTRAVENOUS
  Filled 2014-04-23: qty 40

## 2014-04-23 NOTE — Progress Notes (Signed)
The patient requested Xanax for anxiety and Colace for hard stools.  Dr. Claiborne Billings was notified.  New orders were given for a one-time dose of 0.5 mg of Xanax and daily Colace.

## 2014-04-23 NOTE — Progress Notes (Signed)
SUBJECTIVE:  No groin bleeding.  No chest pain.  WOuld like surgery to be done today instead of waiting until tomorrow.  OBJECTIVE:   Vitals:   Filed Vitals:   04/22/14 1450 04/22/14 2144 04/23/14 0233 04/23/14 0616  BP: 160/100 151/73 140/62 104/66  Pulse: 77 76 76 84  Temp: 97.5 F (36.4 C) 98 F (36.7 C) 97.4 F (36.3 C) 97.3 F (36.3 C)  TempSrc: Oral Oral Oral Oral  Resp: 20 20 20 20   Height:      Weight:    157 lb 3 oz (71.3 kg)  SpO2: 97% 98% 98% 100%   I&O's:    Intake/Output Summary (Last 24 hours) at 04/23/14 1114 Last data filed at 04/23/14 0855  Gross per 24 hour  Intake    840 ml  Output      0 ml  Net    840 ml   TELEMETRY: Reviewed telemetry pt in NSR:     PHYSICAL EXAM General: Well developed, well nourished, in no acute distress; appears much younger than stated age Head:   Normal cephalic and atramatic  Lungs:   Clear bilaterally to auscultation. Heart:   HRRR S1 S2  No JVD.   Abdomen: abdomen soft and non-tender Msk:  Back normal,  Normal strength and tone for age. Extremities:  No edema.  No groin hematoma Neuro: Alert and oriented. Psych:  Normal affect, responds appropriately   LABS: Basic Metabolic Panel: No results found for this basename: NA, K, CL, CO2, GLUCOSE, BUN, CREATININE, CALCIUM, MG, PHOS,  in the last 72 hours Liver Function Tests: No results found for this basename: AST, ALT, ALKPHOS, BILITOT, PROT, ALBUMIN,  in the last 72 hours No results found for this basename: LIPASE, AMYLASE,  in the last 72 hours CBC: No results found for this basename: WBC, NEUTROABS, HGB, HCT, MCV, PLT,  in the last 72 hours Cardiac Enzymes: No results found for this basename: CKTOTAL, CKMB, CKMBINDEX, TROPONINI,  in the last 72 hours BNP: No components found with this basename: POCBNP,  D-Dimer: No results found for this basename: DDIMER,  in the last 72 hours Hemoglobin A1C: No results found for this basename: HGBA1C,  in the last 72  hours Fasting Lipid Panel: No results found for this basename: CHOL, HDL, LDLCALC, TRIG, CHOLHDL, LDLDIRECT,  in the last 72 hours Thyroid Function Tests: No results found for this basename: TSH, T4TOTAL, FREET3, T3FREE, THYROIDAB,  in the last 72 hours Anemia Panel: No results found for this basename: VITAMINB12, FOLATE, FERRITIN, TIBC, IRON, RETICCTPCT,  in the last 72 hours Coag Panel:   Lab Results  Component Value Date   INR 1.03 04/18/2014    RADIOLOGY: Ct Angio Chest Pe W/cm &/or Wo Cm  04/06/2014   CLINICAL DATA:  Shortness of breath with exacerbation over the past 3 months. Nodular density on recent chest x-ray. Elevated D-dimer. Esophageal stricture.  EXAM: CT ANGIOGRAPHY CHEST WITH CONTRAST  TECHNIQUE: Multidetector CT imaging of the chest was performed using the standard protocol during bolus administration of intravenous contrast. Multiplanar CT image reconstructions and MIPs were obtained to evaluate the vascular anatomy.  CONTRAST:  157mL OMNIPAQUE IOHEXOL 350 MG/ML SOLN  COMPARISON:  04/05/2014 chest x-ray.  FINDINGS: Scattered pulmonary parenchymal changes suggestive of scarring/ subsegmental atelectasis. No worrisome lung mass identified. What was noted on chest x-ray may have represented complication shadows. Incidentally noted is what appears to be a small lymph node in the left major fissure.  No pulmonary embolus  detected.  Cardiomegaly with left ventricular prominence.  Coronary artery calcifications.  Atherosclerotic type changes of the thoracic aorta with prominent irregular plaque most notable descending thoracic aorta which is ectatic.  Small hiatal hernia.  No mediastinal or hilar adenopathy.  New bony destructive lesion.  Visualized upper abdominal structures unremarkable.  Review of the MIP images confirms the above findings.  IMPRESSION: Scattered pulmonary parenchymal changes suggestive of scarring/ subsegmental atelectasis. No worrisome lung mass identified. What was  noted on chest x-ray may have represented complication shadows.  No pulmonary embolus detected.  Cardiomegaly with left ventricular prominence.  Coronary artery calcifications.  Atherosclerotic type changes of the thoracic aorta with prominent irregular plaque most notable descending thoracic aorta which is ectatic.  Small hiatal hernia.  This is a call report.   Electronically Signed   By: Chauncey Cruel M.D.   On: 04/06/2014 13:34      ASSESSMENT: PLAN:    CAD: Plan for CABG on Monday. Continue aspirin and lipitor.   Cardiomyopathy: Ischemic.  No sx of CHF at this time. Will need ACE-I and beta blocker post operatively.  WOuld not start now due to recent dye exposure and upcoming CABG.  Getting preCABG studies.  Jettie Booze, MD  04/23/2014  11:14 AM

## 2014-04-24 ENCOUNTER — Encounter (HOSPITAL_COMMUNITY)
Admission: RE | Disposition: A | Discharge status: Home / Self Care | Payer: Medicare Other | Source: Ambulatory Visit | Attending: Surgery

## 2014-04-24 ENCOUNTER — Encounter (HOSPITAL_COMMUNITY): Payer: Medicare Other | Admitting: Certified Registered"

## 2014-04-24 ENCOUNTER — Encounter (HOSPITAL_COMMUNITY): Payer: Self-pay | Admitting: Critical Care Medicine

## 2014-04-24 ENCOUNTER — Inpatient Hospital Stay (HOSPITAL_COMMUNITY): Payer: Medicare Other

## 2014-04-24 ENCOUNTER — Inpatient Hospital Stay (HOSPITAL_COMMUNITY): Payer: Medicare Other | Admitting: Certified Registered"

## 2014-04-24 DIAGNOSIS — Z951 Presence of aortocoronary bypass graft: Secondary | ICD-10-CM

## 2014-04-24 DIAGNOSIS — I2510 Atherosclerotic heart disease of native coronary artery without angina pectoris: Secondary | ICD-10-CM

## 2014-04-24 HISTORY — PX: INTRAOPERATIVE TRANSESOPHAGEAL ECHOCARDIOGRAM: SHX5062

## 2014-04-24 HISTORY — PX: CORONARY ARTERY BYPASS GRAFT: SHX141

## 2014-04-24 LAB — CREATININE, SERUM
Creatinine, Ser: 0.88 mg/dL (ref 0.50–1.35)
GFR calc Af Amer: 84 mL/min — ABNORMAL LOW (ref >90)
GFR calc non Af Amer: 72 mL/min — ABNORMAL LOW (ref >90)

## 2014-04-24 LAB — POCT I-STAT 3, ART BLOOD GAS (G3+)
Acid-base deficit: 2 mmol/L (ref 0.0–2.0)
Acid-base deficit: 4 mmol/L — ABNORMAL HIGH (ref 0.0–2.0)
Acid-base deficit: 3 mmol/L — ABNORMAL HIGH (ref 0.0–2.0)
Acid-base deficit: 3 mmol/L — ABNORMAL HIGH (ref 0.0–2.0)
BICARBONATE: 21.9 meq/L (ref 20.0–24.0)
BICARBONATE: 22.4 meq/L (ref 20.0–24.0)
Bicarbonate: 24.4 mEq/L — ABNORMAL HIGH (ref 20.0–24.0)
Bicarbonate: 21.8 mEq/L (ref 20.0–24.0)
O2 Saturation: 100 %
O2 Saturation: 96 %
O2 Saturation: 99 %
O2 Saturation: 97 %
PCO2 ART: 47.2 mmHg — AB (ref 35.0–45.0)
PCO2 ART: 41.4 mmHg (ref 35.0–45.0)
PH ART: 7.326 — AB (ref 7.350–7.450)
PO2 ART: 348 mmHg — AB (ref 80.0–100.0)
PO2 ART: 91 mmHg (ref 80.0–100.0)
Patient temperature: 36.1
TCO2: 26 mmol/L (ref 0–100)
TCO2: 23 mmol/L (ref 0–100)
TCO2: 23 mmol/L (ref 0–100)
TCO2: 24 mmol/L (ref 0–100)
pCO2 arterial: 40.1 mmHg (ref 35.0–45.0)
pCO2 arterial: 40 mmHg (ref 35.0–45.0)
pH, Arterial: 7.321 — ABNORMAL LOW (ref 7.350–7.450)
pH, Arterial: 7.346 — ABNORMAL LOW (ref 7.350–7.450)
pH, Arterial: 7.356 (ref 7.350–7.450)
pO2, Arterial: 81 mmHg (ref 80.0–100.0)
pO2, Arterial: 138 mmHg — ABNORMAL HIGH (ref 80.0–100.0)

## 2014-04-24 LAB — CBC
HCT: 32.1 % — ABNORMAL LOW (ref 39.0–52.0)
HEMATOCRIT: 31.2 % — AB (ref 39.0–52.0)
HEMOGLOBIN: 10.2 g/dL — AB (ref 13.0–17.0)
Hemoglobin: 10 g/dL — ABNORMAL LOW (ref 13.0–17.0)
MCH: 25.1 pg — AB (ref 26.0–34.0)
MCH: 25.2 pg — ABNORMAL LOW (ref 26.0–34.0)
MCHC: 31.8 g/dL (ref 30.0–36.0)
MCHC: 32.1 g/dL (ref 30.0–36.0)
MCV: 79.1 fL (ref 78.0–100.0)
MCV: 78.6 fL (ref 78.0–100.0)
Platelets: 147 10*3/uL — ABNORMAL LOW (ref 150–400)
Platelets: 144 10*3/uL — ABNORMAL LOW (ref 150–400)
RBC: 4.06 MIL/uL — ABNORMAL LOW (ref 4.22–5.81)
RBC: 3.97 MIL/uL — ABNORMAL LOW (ref 4.22–5.81)
RDW: 16.5 % — ABNORMAL HIGH (ref 11.5–15.5)
RDW: 16.5 % — ABNORMAL HIGH (ref 11.5–15.5)
WBC: 14.8 10*3/uL — ABNORMAL HIGH (ref 4.0–10.5)
WBC: 16.1 10*3/uL — ABNORMAL HIGH (ref 4.0–10.5)

## 2014-04-24 LAB — GLUCOSE, CAPILLARY
GLUCOSE-CAPILLARY: 109 mg/dL — AB (ref 70–99)
GLUCOSE-CAPILLARY: 125 mg/dL — AB (ref 70–99)
GLUCOSE-CAPILLARY: 117 mg/dL — AB (ref 70–99)
GLUCOSE-CAPILLARY: 128 mg/dL — AB (ref 70–99)
Glucose-Capillary: 91 mg/dL (ref 70–99)
Glucose-Capillary: 114 mg/dL — ABNORMAL HIGH (ref 70–99)
Glucose-Capillary: 136 mg/dL — ABNORMAL HIGH (ref 70–99)
Glucose-Capillary: 132 mg/dL — ABNORMAL HIGH (ref 70–99)
Glucose-Capillary: 116 mg/dL — ABNORMAL HIGH (ref 70–99)

## 2014-04-24 LAB — POCT I-STAT 4, (NA,K, GLUC, HGB,HCT)
GLUCOSE: 122 mg/dL — AB (ref 70–99)
Glucose, Bld: 109 mg/dL — ABNORMAL HIGH (ref 70–99)
Glucose, Bld: 101 mg/dL — ABNORMAL HIGH (ref 70–99)
Glucose, Bld: 110 mg/dL — ABNORMAL HIGH (ref 70–99)
Glucose, Bld: 99 mg/dL (ref 70–99)
Glucose, Bld: 113 mg/dL — ABNORMAL HIGH (ref 70–99)
HCT: 26 % — ABNORMAL LOW (ref 39.0–52.0)
HCT: 28 % — ABNORMAL LOW (ref 39.0–52.0)
HCT: 28 % — ABNORMAL LOW (ref 39.0–52.0)
HCT: 35 % — ABNORMAL LOW (ref 39.0–52.0)
HEMATOCRIT: 38 % — AB (ref 39.0–52.0)
HEMATOCRIT: 35 % — AB (ref 39.0–52.0)
HEMOGLOBIN: 12.9 g/dL — AB (ref 13.0–17.0)
HEMOGLOBIN: 11.9 g/dL — AB (ref 13.0–17.0)
HEMOGLOBIN: 9.5 g/dL — AB (ref 13.0–17.0)
Hemoglobin: 8.8 g/dL — ABNORMAL LOW (ref 13.0–17.0)
Hemoglobin: 9.5 g/dL — ABNORMAL LOW (ref 13.0–17.0)
Hemoglobin: 11.9 g/dL — ABNORMAL LOW (ref 13.0–17.0)
POTASSIUM: 3.9 meq/L (ref 3.7–5.3)
Potassium: 4.1 mEq/L (ref 3.7–5.3)
Potassium: 4.1 mEq/L (ref 3.7–5.3)
Potassium: 5.8 mEq/L — ABNORMAL HIGH (ref 3.7–5.3)
Potassium: 5.3 mEq/L (ref 3.7–5.3)
Potassium: 4.8 mEq/L (ref 3.7–5.3)
SODIUM: 141 meq/L (ref 137–147)
SODIUM: 131 meq/L — AB (ref 137–147)
SODIUM: 136 meq/L — AB (ref 137–147)
Sodium: 139 mEq/L (ref 137–147)
Sodium: 135 mEq/L — ABNORMAL LOW (ref 137–147)
Sodium: 136 mEq/L — ABNORMAL LOW (ref 137–147)

## 2014-04-24 LAB — POCT I-STAT, CHEM 8
BUN: 16 mg/dL (ref 6–23)
CALCIUM ION: 1.08 mmol/L — AB (ref 1.13–1.30)
Chloride: 106 mEq/L (ref 96–112)
Creatinine, Ser: 0.9 mg/dL (ref 0.50–1.35)
Glucose, Bld: 130 mg/dL — ABNORMAL HIGH (ref 70–99)
HEMATOCRIT: 32 % — AB (ref 39.0–52.0)
Hemoglobin: 10.9 g/dL — ABNORMAL LOW (ref 13.0–17.0)
Potassium: 4.2 mEq/L (ref 3.7–5.3)
Sodium: 141 mEq/L (ref 137–147)
TCO2: 22 mmol/L (ref 0–100)

## 2014-04-24 LAB — URINALYSIS, ROUTINE W REFLEX MICROSCOPIC
BILIRUBIN URINE: NEGATIVE
Glucose, UA: NEGATIVE mg/dL
HGB URINE DIPSTICK: NEGATIVE
Ketones, ur: NEGATIVE mg/dL
Nitrite: NEGATIVE
PH: 5.5 (ref 5.0–8.0)
Protein, ur: NEGATIVE mg/dL
SPECIFIC GRAVITY, URINE: 1.013 (ref 1.005–1.030)
Urobilinogen, UA: 1 mg/dL (ref 0.0–1.0)

## 2014-04-24 LAB — PROTIME-INR
INR: 1.54 — AB (ref 0.00–1.49)
PROTHROMBIN TIME: 18.1 s — AB (ref 11.6–15.2)

## 2014-04-24 LAB — PLATELET COUNT: PLATELETS: 144 10*3/uL — AB (ref 150–400)

## 2014-04-24 LAB — MAGNESIUM: Magnesium: 3.1 mg/dL — ABNORMAL HIGH (ref 1.5–2.5)

## 2014-04-24 LAB — HEMOGLOBIN AND HEMATOCRIT, BLOOD
HCT: 29.1 % — ABNORMAL LOW (ref 39.0–52.0)
Hemoglobin: 9.3 g/dL — ABNORMAL LOW (ref 13.0–17.0)

## 2014-04-24 LAB — HEMOGLOBIN A1C
Hgb A1c MFr Bld: 6.3 % — ABNORMAL HIGH (ref <5.7)
Mean Plasma Glucose: 134 mg/dL — ABNORMAL HIGH (ref <117)

## 2014-04-24 LAB — ABO/RH: ABO/RH(D): O POS

## 2014-04-24 LAB — APTT: APTT: 38 s — AB (ref 24–37)

## 2014-04-24 LAB — URINE MICROSCOPIC-ADD ON

## 2014-04-24 SURGERY — CORONARY ARTERY BYPASS GRAFTING (CABG)
Anesthesia: General | Site: Chest

## 2014-04-24 MED ORDER — ALBUMIN HUMAN 5 % IV SOLN
250.0000 mL | INTRAVENOUS | Status: AC | PRN
  Administered 2014-04-24 (×3): 250 mL via INTRAVENOUS
  Filled 2014-04-24 (×2): qty 250

## 2014-04-24 MED ORDER — FENTANYL CITRATE 0.05 MG/ML IJ SOLN
INTRAMUSCULAR | Status: AC
  Filled 2014-04-24: qty 5

## 2014-04-24 MED ORDER — POTASSIUM CHLORIDE 10 MEQ/50ML IV SOLN: 10.0000 meq | INTRAVENOUS | Status: AC

## 2014-04-24 MED ORDER — DEXTROSE 5 % IV SOLN
1.5000 g | Freq: Two times a day (BID) | INTRAVENOUS | Status: AC
  Administered 2014-04-24 – 2014-04-26 (×4): 1.5 g via INTRAVENOUS
  Filled 2014-04-24 (×4): qty 1.5

## 2014-04-24 MED ORDER — HEPARIN SODIUM (PORCINE) 1000 UNIT/ML IJ SOLN
INTRAMUSCULAR | Status: DC | PRN
  Administered 2014-04-24: 23000 [IU] via INTRAVENOUS

## 2014-04-24 MED ORDER — ASPIRIN 81 MG PO CHEW: 324.0000 mg | CHEWABLE_TABLET | Freq: Every day | ORAL | Status: DC

## 2014-04-24 MED ORDER — MIDAZOLAM HCL 5 MG/5ML IJ SOLN
INTRAMUSCULAR | Status: DC | PRN
  Administered 2014-04-24: 3 mg via INTRAVENOUS
  Administered 2014-04-24 (×2): 1 mg via INTRAVENOUS

## 2014-04-24 MED ORDER — MORPHINE SULFATE 2 MG/ML IJ SOLN: 1.0000 mg | INTRAMUSCULAR | Status: AC | PRN

## 2014-04-24 MED ORDER — PHENYLEPHRINE 40 MCG/ML (10ML) SYRINGE FOR IV PUSH (FOR BLOOD PRESSURE SUPPORT)
PREFILLED_SYRINGE | INTRAVENOUS | Status: AC
  Filled 2014-04-24: qty 10

## 2014-04-24 MED ORDER — BISACODYL 10 MG RE SUPP: 10.0000 mg | Freq: Every day | RECTAL | Status: DC

## 2014-04-24 MED ORDER — LACTATED RINGERS IV SOLN
INTRAVENOUS | Status: DC | PRN
Start: 2014-04-24 — End: 2014-04-24
  Administered 2014-04-24: 07:00:00 via INTRAVENOUS

## 2014-04-24 MED ORDER — SODIUM CHLORIDE 0.45 % IV SOLN: INTRAVENOUS | Status: DC

## 2014-04-24 MED ORDER — MORPHINE SULFATE 2 MG/ML IJ SOLN: 2.0000 mg | INTRAMUSCULAR | Status: DC | PRN

## 2014-04-24 MED ORDER — HYDROMORPHONE HCL PF 1 MG/ML IJ SOLN: 0.2500 mg | INTRAMUSCULAR | Status: DC | PRN

## 2014-04-24 MED ORDER — ASPIRIN EC 325 MG PO TBEC
325.0000 mg | DELAYED_RELEASE_TABLET | Freq: Every day | ORAL | Status: DC
  Administered 2014-04-25 – 2014-04-30 (×6): 325 mg via ORAL
  Filled 2014-04-24 (×6): qty 1

## 2014-04-24 MED ORDER — LIDOCAINE HCL (CARDIAC) 20 MG/ML IV SOLN
INTRAVENOUS | Status: AC
  Filled 2014-04-24: qty 5

## 2014-04-24 MED ORDER — PANTOPRAZOLE SODIUM 40 MG PO TBEC
40.0000 mg | DELAYED_RELEASE_TABLET | Freq: Every day | ORAL | Status: DC
  Administered 2014-04-26 – 2014-04-30 (×5): 40 mg via ORAL
  Filled 2014-04-24 (×5): qty 1

## 2014-04-24 MED ORDER — PHENYLEPHRINE HCL 10 MG/ML IJ SOLN
INTRAMUSCULAR | Status: DC | PRN
  Administered 2014-04-24: 80 ug via INTRAVENOUS

## 2014-04-24 MED ORDER — SODIUM CHLORIDE 0.9 % IJ SOLN
3.0000 mL | Freq: Two times a day (BID) | INTRAMUSCULAR | Status: DC
  Administered 2014-04-25 – 2014-04-26 (×3): 3 mL via INTRAVENOUS

## 2014-04-24 MED ORDER — FAMOTIDINE IN NACL 20-0.9 MG/50ML-% IV SOLN
20.0000 mg | Freq: Two times a day (BID) | INTRAVENOUS | Status: DC
  Administered 2014-04-24: 20 mg via INTRAVENOUS

## 2014-04-24 MED ORDER — OXYCODONE HCL 5 MG PO TABS: 5.0000 mg | ORAL_TABLET | ORAL | Status: DC | PRN

## 2014-04-24 MED ORDER — OXYCODONE HCL 5 MG/5ML PO SOLN: 5.0000 mg | Freq: Once | ORAL | Status: AC | PRN

## 2014-04-24 MED ORDER — LACTATED RINGERS IV SOLN
INTRAVENOUS | Status: DC
Start: 2014-04-24 — End: 2014-04-26

## 2014-04-24 MED ORDER — VECURONIUM BROMIDE 10 MG IV SOLR
INTRAVENOUS | Status: AC
  Filled 2014-04-24: qty 10

## 2014-04-24 MED ORDER — DEXMEDETOMIDINE HCL IN NACL 200 MCG/50ML IV SOLN: 0.1000 ug/kg/h | INTRAVENOUS | Status: DC

## 2014-04-24 MED ORDER — ONDANSETRON HCL 4 MG/2ML IJ SOLN
4.0000 mg | Freq: Four times a day (QID) | INTRAMUSCULAR | Status: DC | PRN
  Administered 2014-04-29: 4 mg via INTRAVENOUS
  Filled 2014-04-24: qty 2

## 2014-04-24 MED ORDER — PHENYLEPHRINE HCL 10 MG/ML IJ SOLN
0.0000 ug/min | INTRAVENOUS | Status: DC
  Filled 2014-04-24: qty 2

## 2014-04-24 MED ORDER — HEPARIN SODIUM (PORCINE) 1000 UNIT/ML IJ SOLN
INTRAMUSCULAR | Status: AC
  Filled 2014-04-24: qty 1

## 2014-04-24 MED ORDER — LACTATED RINGERS IV SOLN
500.0000 mL | Freq: Once | INTRAVENOUS | Status: AC | PRN
Start: 2014-04-24 — End: 2014-04-24

## 2014-04-24 MED ORDER — ARTIFICIAL TEARS OP OINT
TOPICAL_OINTMENT | OPHTHALMIC | Status: AC
  Filled 2014-04-24: qty 3.5

## 2014-04-24 MED ORDER — THROMBIN 20000 UNITS EX SOLR
OROMUCOSAL | Status: DC | PRN
  Administered 2014-04-24: 08:00:00 via TOPICAL

## 2014-04-24 MED ORDER — SODIUM CHLORIDE 0.9 % IV SOLN
INTRAVENOUS | Status: DC | PRN
Start: 2014-04-24 — End: 2014-04-24
  Administered 2014-04-24: 12:00:00 via INTRAVENOUS

## 2014-04-24 MED ORDER — PROPOFOL 10 MG/ML IV BOLUS
INTRAVENOUS | Status: AC
  Filled 2014-04-24: qty 20

## 2014-04-24 MED ORDER — ACETAMINOPHEN 650 MG RE SUPP
650.0000 mg | Freq: Once | RECTAL | Status: AC
  Administered 2014-04-24: 650 mg via RECTAL

## 2014-04-24 MED ORDER — METOPROLOL TARTRATE 1 MG/ML IV SOLN: 2.5000 mg | INTRAVENOUS | Status: DC | PRN

## 2014-04-24 MED ORDER — OXYCODONE HCL 5 MG PO TABS: 5.0000 mg | ORAL_TABLET | Freq: Once | ORAL | Status: AC | PRN

## 2014-04-24 MED ORDER — FENTANYL CITRATE 0.05 MG/ML IJ SOLN
INTRAMUSCULAR | Status: DC | PRN
  Administered 2014-04-24: 750 ug via INTRAVENOUS
  Administered 2014-04-24: 100 ug via INTRAVENOUS
  Administered 2014-04-24: 150 ug via INTRAVENOUS
  Administered 2014-04-24: 50 ug via INTRAVENOUS
  Administered 2014-04-24: 150 ug via INTRAVENOUS
  Administered 2014-04-24: 50 ug via INTRAVENOUS

## 2014-04-24 MED ORDER — SODIUM CHLORIDE 0.9 % IJ SOLN
INTRAMUSCULAR | Status: AC
  Filled 2014-04-24: qty 10

## 2014-04-24 MED ORDER — SODIUM CHLORIDE 0.9 % IV SOLN
INTRAVENOUS | Status: DC
  Administered 2014-04-24: 1.1 [IU]/h via INTRAVENOUS
  Filled 2014-04-24 (×2): qty 1

## 2014-04-24 MED ORDER — METOPROLOL TARTRATE 12.5 MG HALF TABLET
12.5000 mg | ORAL_TABLET | Freq: Two times a day (BID) | ORAL | Status: DC
  Filled 2014-04-24 (×3): qty 1

## 2014-04-24 MED ORDER — MIDAZOLAM HCL 2 MG/2ML IJ SOLN: 2.0000 mg | INTRAMUSCULAR | Status: DC | PRN

## 2014-04-24 MED ORDER — HEMOSTATIC AGENTS (NO CHARGE) OPTIME
TOPICAL | Status: DC | PRN
  Administered 2014-04-24: 1 via TOPICAL

## 2014-04-24 MED ORDER — ACETAMINOPHEN 160 MG/5ML PO SOLN: 650.0000 mg | Freq: Once | ORAL | Status: AC

## 2014-04-24 MED ORDER — VECURONIUM BROMIDE 10 MG IV SOLR
INTRAVENOUS | Status: DC | PRN
  Administered 2014-04-24 (×2): 5 mg via INTRAVENOUS
  Administered 2014-04-24: 10 mg via INTRAVENOUS

## 2014-04-24 MED ORDER — PROPOFOL 10 MG/ML IV BOLUS
INTRAVENOUS | Status: DC | PRN
Start: 2014-04-24 — End: 2014-04-24
  Administered 2014-04-24: 40 mg via INTRAVENOUS

## 2014-04-24 MED ORDER — DOCUSATE SODIUM 100 MG PO CAPS
200.0000 mg | ORAL_CAPSULE | Freq: Every day | ORAL | Status: DC
  Administered 2014-04-25 – 2014-04-26 (×2): 200 mg via ORAL
  Filled 2014-04-24 (×3): qty 2

## 2014-04-24 MED ORDER — ACETAMINOPHEN 500 MG PO TABS
1000.0000 mg | ORAL_TABLET | Freq: Four times a day (QID) | ORAL | Status: DC
  Administered 2014-04-25 – 2014-04-26 (×6): 1000 mg via ORAL
  Filled 2014-04-24 (×10): qty 2

## 2014-04-24 MED ORDER — ONDANSETRON HCL 4 MG/2ML IJ SOLN: 4.0000 mg | Freq: Once | INTRAMUSCULAR | Status: AC | PRN

## 2014-04-24 MED ORDER — SODIUM CHLORIDE 0.9 % IV SOLN: 250.0000 mL | INTRAVENOUS | Status: DC

## 2014-04-24 MED ORDER — EPHEDRINE SULFATE 50 MG/ML IJ SOLN
INTRAMUSCULAR | Status: AC
  Filled 2014-04-24: qty 1

## 2014-04-24 MED ORDER — BISACODYL 5 MG PO TBEC
10.0000 mg | DELAYED_RELEASE_TABLET | Freq: Every day | ORAL | Status: DC
  Administered 2014-04-25 – 2014-04-26 (×2): 10 mg via ORAL
  Filled 2014-04-24 (×2): qty 2

## 2014-04-24 MED ORDER — PROTAMINE SULFATE 10 MG/ML IV SOLN
INTRAVENOUS | Status: DC | PRN
  Administered 2014-04-24: 10 mg via INTRAVENOUS
  Administered 2014-04-24: 190 mg via INTRAVENOUS

## 2014-04-24 MED ORDER — SODIUM CHLORIDE 0.9 % IV SOLN: INTRAVENOUS | Status: DC

## 2014-04-24 MED ORDER — VANCOMYCIN HCL IN DEXTROSE 1-5 GM/200ML-% IV SOLN
1000.0000 mg | Freq: Once | INTRAVENOUS | Status: AC
  Administered 2014-04-24: 1000 mg via INTRAVENOUS
  Filled 2014-04-24: qty 200

## 2014-04-24 MED ORDER — SODIUM CHLORIDE 0.9 % IJ SOLN: 3.0000 mL | INTRAMUSCULAR | Status: DC | PRN

## 2014-04-24 MED ORDER — ACETAMINOPHEN 160 MG/5ML PO SOLN: 1000.0000 mg | Freq: Four times a day (QID) | ORAL | Status: DC

## 2014-04-24 MED ORDER — MIDAZOLAM HCL 10 MG/2ML IJ SOLN
INTRAMUSCULAR | Status: AC
  Filled 2014-04-24: qty 2

## 2014-04-24 MED ORDER — NITROGLYCERIN IN D5W 200-5 MCG/ML-% IV SOLN
0.0000 ug/min | INTRAVENOUS | Status: DC
Start: 2014-04-24 — End: 2014-04-25

## 2014-04-24 MED ORDER — ROCURONIUM BROMIDE 100 MG/10ML IV SOLN
INTRAVENOUS | Status: DC | PRN
  Administered 2014-04-24: 50 mg via INTRAVENOUS

## 2014-04-24 MED ORDER — LACTATED RINGERS IV SOLN
INTRAVENOUS | Status: DC | PRN
  Administered 2014-04-24: 07:00:00 via INTRAVENOUS

## 2014-04-24 MED ORDER — MEPERIDINE HCL 25 MG/ML IJ SOLN: 6.2500 mg | INTRAMUSCULAR | Status: DC | PRN

## 2014-04-24 MED ORDER — SODIUM CHLORIDE 0.9 % IR SOLN
Status: DC | PRN
  Administered 2014-04-24: 1000 mL

## 2014-04-24 MED ORDER — METOPROLOL TARTRATE 25 MG/10 ML ORAL SUSPENSION
12.5000 mg | Freq: Two times a day (BID) | ORAL | Status: DC
  Filled 2014-04-24 (×3): qty 5

## 2014-04-24 MED ORDER — ALBUMIN HUMAN 5 % IV SOLN
INTRAVENOUS | Status: DC | PRN
  Administered 2014-04-24 (×2): via INTRAVENOUS

## 2014-04-24 MED ORDER — ARTIFICIAL TEARS OP OINT
TOPICAL_OINTMENT | OPHTHALMIC | Status: DC | PRN
  Administered 2014-04-24: 1 via OPHTHALMIC

## 2014-04-24 MED ORDER — INSULIN REGULAR BOLUS VIA INFUSION
0.0000 [IU] | Freq: Three times a day (TID) | INTRAVENOUS | Status: DC
  Filled 2014-04-24: qty 10

## 2014-04-24 MED ORDER — MAGNESIUM SULFATE 4000MG/100ML IJ SOLN
4.0000 g | Freq: Once | INTRAMUSCULAR | Status: AC
  Administered 2014-04-24: 4 g via INTRAVENOUS
  Filled 2014-04-24: qty 100

## 2014-04-24 MED FILL — Lidocaine HCl IV Inj 20 MG/ML: INTRAVENOUS | Qty: 5 | Status: AC

## 2014-04-24 MED FILL — Mannitol IV Soln 20%: INTRAVENOUS | Qty: 500 | Status: AC

## 2014-04-24 MED FILL — Sodium Chloride IV Soln 0.9%: INTRAVENOUS | Qty: 2000 | Status: AC

## 2014-04-24 MED FILL — Heparin Sodium (Porcine) Inj 1000 Unit/ML: INTRAMUSCULAR | Qty: 10 | Status: AC

## 2014-04-24 MED FILL — Electrolyte-R (PH 7.4) Solution: INTRAVENOUS | Qty: 1000 | Status: AC

## 2014-04-24 MED FILL — Sodium Bicarbonate IV Soln 8.4%: INTRAVENOUS | Qty: 50 | Status: AC

## 2014-04-24 SURGICAL SUPPLY — 107 items
APL SKNCLS STERI-STRIP NONHPOA (GAUZE/BANDAGES/DRESSINGS) ×2
ATTRACTOMAT 16X20 MAGNETIC DRP (DRAPES) ×4 IMPLANT
BAG DECANTER FOR FLEXI CONT (MISCELLANEOUS) ×4 IMPLANT
BANDAGE ELASTIC 4 VELCRO ST LF (GAUZE/BANDAGES/DRESSINGS) ×4 IMPLANT
BANDAGE ELASTIC 6 VELCRO ST LF (GAUZE/BANDAGES/DRESSINGS) ×4 IMPLANT
BANDAGE GAUZE ELAST BULKY 4 IN (GAUZE/BANDAGES/DRESSINGS) ×4 IMPLANT
BASKET HEART  (ORDER IN 25'S) (MISCELLANEOUS) ×1
BASKET HEART (ORDER IN 25'S) (MISCELLANEOUS) ×1
BASKET HEART (ORDER IN 25S) (MISCELLANEOUS) ×2 IMPLANT
BENZOIN TINCTURE PRP APPL 2/3 (GAUZE/BANDAGES/DRESSINGS) ×2 IMPLANT
BLADE STERNUM SYSTEM 6 (BLADE) ×4 IMPLANT
BLADE SURG 11 STRL SS (BLADE) ×2 IMPLANT
CANISTER SUCTION 2500CC (MISCELLANEOUS) ×4 IMPLANT
CANNULA VESSEL 3MM BLUNT TIP (CANNULA) ×4 IMPLANT
CARDIAC SUCTION (MISCELLANEOUS) ×4 IMPLANT
CATH ROBINSON RED A/P 18FR (CATHETERS) ×8 IMPLANT
CATH THORACIC 28FR (CATHETERS) ×4 IMPLANT
CATH THORACIC 36FR (CATHETERS) ×4 IMPLANT
CATH THORACIC 36FR RT ANG (CATHETERS) ×4 IMPLANT
CLIP TI MEDIUM 24 (CLIP) IMPLANT
CLIP TI WIDE RED SMALL 24 (CLIP) ×2 IMPLANT
CLOSURE WOUND 1/2 X4 (GAUZE/BANDAGES/DRESSINGS) ×1
COVER SURGICAL LIGHT HANDLE (MISCELLANEOUS) ×4 IMPLANT
CRADLE DONUT ADULT HEAD (MISCELLANEOUS) ×4 IMPLANT
DRAPE CARDIOVASCULAR INCISE (DRAPES) ×4
DRAPE SLUSH/WARMER DISC (DRAPES) ×4 IMPLANT
DRAPE SRG 135X102X78XABS (DRAPES) ×2 IMPLANT
DRSG COVADERM 4X14 (GAUZE/BANDAGES/DRESSINGS) ×4 IMPLANT
ELECT CAUTERY BLADE 6.4 (BLADE) ×4 IMPLANT
ELECT REM PT RETURN 9FT ADLT (ELECTROSURGICAL) ×8
ELECTRODE REM PT RTRN 9FT ADLT (ELECTROSURGICAL) ×4 IMPLANT
GLOVE BIO SURGEON STRL SZ 6 (GLOVE) ×4 IMPLANT
GLOVE BIO SURGEON STRL SZ 6.5 (GLOVE) ×6 IMPLANT
GLOVE BIO SURGEON STRL SZ7 (GLOVE) IMPLANT
GLOVE BIO SURGEON STRL SZ7.5 (GLOVE) IMPLANT
GLOVE BIO SURGEONS STRL SZ 6.5 (GLOVE) ×6
GLOVE BIOGEL PI IND STRL 6 (GLOVE) IMPLANT
GLOVE BIOGEL PI IND STRL 6.5 (GLOVE) IMPLANT
GLOVE BIOGEL PI IND STRL 7.0 (GLOVE) IMPLANT
GLOVE BIOGEL PI INDICATOR 6 (GLOVE)
GLOVE BIOGEL PI INDICATOR 6.5 (GLOVE)
GLOVE BIOGEL PI INDICATOR 7.0 (GLOVE) ×6
GLOVE EUDERMIC 7 POWDERFREE (GLOVE) ×8 IMPLANT
GLOVE ORTHO TXT STRL SZ7.5 (GLOVE) IMPLANT
GOWN STRL REUS W/ TWL LRG LVL3 (GOWN DISPOSABLE) ×8 IMPLANT
GOWN STRL REUS W/ TWL XL LVL3 (GOWN DISPOSABLE) ×2 IMPLANT
GOWN STRL REUS W/TWL LRG LVL3 (GOWN DISPOSABLE) ×28
GOWN STRL REUS W/TWL XL LVL3 (GOWN DISPOSABLE) ×4
HEMOSTAT POWDER SURGIFOAM 1G (HEMOSTASIS) ×12 IMPLANT
HEMOSTAT SURGICEL 2X14 (HEMOSTASIS) ×4 IMPLANT
INSERT FOGARTY 61MM (MISCELLANEOUS) IMPLANT
INSERT FOGARTY XLG (MISCELLANEOUS) IMPLANT
KIT BASIN OR (CUSTOM PROCEDURE TRAY) ×4 IMPLANT
KIT CATH CPB BARTLE (MISCELLANEOUS) ×4 IMPLANT
KIT ROOM TURNOVER OR (KITS) ×4 IMPLANT
KIT SUCTION CATH 14FR (SUCTIONS) ×4 IMPLANT
KIT VASOVIEW W/TROCAR VH 2000 (KITS) ×4 IMPLANT
NS IRRIG 1000ML POUR BTL (IV SOLUTION) ×20 IMPLANT
PACK OPEN HEART (CUSTOM PROCEDURE TRAY) ×4 IMPLANT
PAD ARMBOARD 7.5X6 YLW CONV (MISCELLANEOUS) ×8 IMPLANT
PAD ELECT DEFIB RADIOL ZOLL (MISCELLANEOUS) ×4 IMPLANT
PENCIL BUTTON HOLSTER BLD 10FT (ELECTRODE) ×4 IMPLANT
PUNCH AORTIC ROTATE 4.0MM (MISCELLANEOUS) IMPLANT
PUNCH AORTIC ROTATE 4.5MM 8IN (MISCELLANEOUS) ×4 IMPLANT
PUNCH AORTIC ROTATE 5MM 8IN (MISCELLANEOUS) IMPLANT
SET CARDIOPLEGIA MPS 5001102 (MISCELLANEOUS) ×2 IMPLANT
SPONGE GAUZE 4X4 12PLY (GAUZE/BANDAGES/DRESSINGS) ×8 IMPLANT
SPONGE INTESTINAL PEANUT (DISPOSABLE) IMPLANT
SPONGE LAP 18X18 X RAY DECT (DISPOSABLE) IMPLANT
SPONGE LAP 4X18 X RAY DECT (DISPOSABLE) ×4 IMPLANT
STRIP CLOSURE SKIN 1/2X4 (GAUZE/BANDAGES/DRESSINGS) ×1 IMPLANT
SUT BONE WAX W31G (SUTURE) ×4 IMPLANT
SUT MNCRL AB 4-0 PS2 18 (SUTURE) ×2 IMPLANT
SUT PROLENE 3 0 SH DA (SUTURE) IMPLANT
SUT PROLENE 3 0 SH1 36 (SUTURE) ×4 IMPLANT
SUT PROLENE 4 0 RB 1 (SUTURE)
SUT PROLENE 4 0 SH DA (SUTURE) IMPLANT
SUT PROLENE 4-0 RB1 .5 CRCL 36 (SUTURE) IMPLANT
SUT PROLENE 5 0 C 1 36 (SUTURE) IMPLANT
SUT PROLENE 6 0 C 1 30 (SUTURE) IMPLANT
SUT PROLENE 7 0 BV 1 (SUTURE) IMPLANT
SUT PROLENE 7 0 BV1 MDA (SUTURE) ×4 IMPLANT
SUT PROLENE 8 0 BV175 6 (SUTURE) IMPLANT
SUT SILK  1 MH (SUTURE)
SUT SILK 1 MH (SUTURE) IMPLANT
SUT STEEL STERNAL CCS#1 18IN (SUTURE) ×4 IMPLANT
SUT STEEL SZ 6 DBL 3X14 BALL (SUTURE) IMPLANT
SUT VIC AB 1 CTX 27 (SUTURE) ×2 IMPLANT
SUT VIC AB 1 CTX 36 (SUTURE) ×8
SUT VIC AB 1 CTX36XBRD ANBCTR (SUTURE) ×4 IMPLANT
SUT VIC AB 2-0 CT1 27 (SUTURE)
SUT VIC AB 2-0 CT1 36 (SUTURE) ×2 IMPLANT
SUT VIC AB 2-0 CT1 TAPERPNT 27 (SUTURE) IMPLANT
SUT VIC AB 2-0 CTX 27 (SUTURE) IMPLANT
SUT VIC AB 3-0 SH 27 (SUTURE)
SUT VIC AB 3-0 SH 27X BRD (SUTURE) IMPLANT
SUT VIC AB 3-0 X1 27 (SUTURE) IMPLANT
SUT VICRYL 4-0 PS2 18IN ABS (SUTURE) IMPLANT
SUTURE E-PAK OPEN HEART (SUTURE) ×4 IMPLANT
SYSTEM SAHARA CHEST DRAIN ATS (WOUND CARE) ×4 IMPLANT
TAPE CLOTH SURG 4X10 WHT LF (GAUZE/BANDAGES/DRESSINGS) ×4 IMPLANT
TOWEL OR 17X24 6PK STRL BLUE (TOWEL DISPOSABLE) ×4 IMPLANT
TOWEL OR 17X26 10 PK STRL BLUE (TOWEL DISPOSABLE) ×4 IMPLANT
TRAY FOLEY IC TEMP SENS 16FR (CATHETERS) ×4 IMPLANT
TUBING INSUFFLATION 10FT LAP (TUBING) ×4 IMPLANT
UNDERPAD 30X30 INCONTINENT (UNDERPADS AND DIAPERS) ×4 IMPLANT
WATER STERILE IRR 1000ML POUR (IV SOLUTION) ×8 IMPLANT

## 2014-04-24 NOTE — Anesthesia Postprocedure Evaluation (Signed)
Anesthesia Post Note  Patient: Bruce Green  Procedure(s) Performed: Procedure(s) (LRB): CORONARY ARTERY BYPASS GRAFTING (CABG) (N/A) INTRAOPERATIVE TRANSESOPHAGEAL ECHOCARDIOGRAM (N/A)  Anesthesia type: General  Patient location: ICU  Post pain: Pain level controlled  Post assessment: Post-op Vital signs reviewed  Last Vitals:  Filed Vitals:   04/24/14 1515  BP:   Pulse: 76  Temp: 36.6 C  Resp: 14    Post vital signs: stable  Level of consciousness: Patient remains intubated per anesthesia plan  Complications: No apparent anesthesia complications

## 2014-04-24 NOTE — Anesthesia Preprocedure Evaluation (Addendum)
Anesthesia Evaluation  Patient identified by MRN, date of birth, ID band Patient awake    Reviewed: Allergy & Precautions, H&P , NPO status , Patient's Chart, lab work & pertinent test results  Airway Mallampati: I TM Distance: >3 FB Neck ROM: Full    Dental  (+) Dental Advisory Given   Pulmonary shortness of breath and with exertion,          Cardiovascular + angina + CAD and +CHF  Echo 04/12/14-  EF 25-30%, global hypokinesis. Mild AI, mild MR, thickened mitral valve leaflets. Moderately dilated LA.    Neuro/Psych    GI/Hepatic hiatal hernia, GERD-  Medicated,  Endo/Other    Renal/GU      Musculoskeletal   Abdominal   Peds  Hematology   Anesthesia Other Findings   Reproductive/Obstetrics                         Anesthesia Physical Anesthesia Plan  ASA: III  Anesthesia Plan: General   Post-op Pain Management:    Induction: Intravenous  Airway Management Planned: Oral ETT  Additional Equipment: Arterial line, CVP, PA Cath, TEE and Ultrasound Guidance Line Placement  Intra-op Plan:   Post-operative Plan:   Informed Consent: I have reviewed the patients History and Physical, chart, labs and discussed the procedure including the risks, benefits and alternatives for the proposed anesthesia with the patient or authorized representative who has indicated his/her understanding and acceptance.     Plan Discussed with: CRNA and Surgeon  Anesthesia Plan Comments:         Anesthesia Quick Evaluation

## 2014-04-24 NOTE — OR Nursing (Signed)
Foley insertion difficult due to foreskin adhered to glande.

## 2014-04-24 NOTE — Progress Notes (Signed)
CT Surgery Preop Note:  He had a stable weekend with no chest pain or shortness of breath. He has no further questions and is ready for surgery.

## 2014-04-24 NOTE — Op Note (Signed)
CARDIOVASCULAR SURGERY OPERATIVE NOTE  04/24/2014  Surgeon:  Gaye Pollack, MD  First Assistant: Suzzanne Cloud, PA-C   Preoperative Diagnosis:  Severe multi-vessel coronary artery disease   Postoperative Diagnosis:  Same   Procedure:  1. Median Sternotomy 2. Extracorporeal circulation 3.   Coronary artery bypass grafting x 3   Left internal mammary graft to the LAD  SVG to OM2  SVG to distal RCA  4.   Endoscopic vein harvest from the right leg   Anesthesia:  General Endotracheal   Clinical History/Surgical Indication:  The patient is a fairly healthy, active and independent 80 year old gentleman who has no prior cardiac history but presents with a several month history of progressive exertional fatigue and shortness of breath. This has limited his activity over this period of time and he can't work in his yard for more than 15 minutes without having to take a break. He has had no chest discomfort. He had a CT scan of the chest to rule out PE which showed none but showed cardiomegaly and significant coronary calcification. He had an echo showed a reduced EF of 25-30% with global hypokinesis, mild AI, mild MR. Cardiac cath today shows 50-60% ostial LM and severe 3-vessel CAD with 99% mid LAD, 70% OM1, 99% OM2, and 100% occlusion of a large dominant RCA with bridging collaterals. PA was 33/7 with a PCWP of 12 and CI 2.4. PA sat 69%. ECG shows sinus rhythm with preserved R-waves.He has significant ostial LM and severe 3-vessel coronary disease with moderate LV dysfunction presenting with progressive exertional fatigue and shortness of breath. He is 89 but in good condition and still quite active and independent and I think CABG is the best treatment to improve his symptoms and prevent further deterioration in LV function. He is at increased risk due to his age but his risk of surgery is still  much lower than his risk with medical therapy and probably lower than his risk with a high risk PCI. I discussed the operative procedure with the patient and his 2 daughters including alternatives, benefits and risks; including but not limited to bleeding, blood transfusion, infection, stroke, myocardial infarction, graft failure, heart block requiring a permanent pacemaker, organ dysfunction, and death. Craige Cotta understands and agrees to proceed.    Preparation:  The patient was seen in the preoperative holding area and the correct patient, correct operation were confirmed with the patient after reviewing the medical record and catheterization. The consent was signed by me. Preoperative antibiotics were given. A pulmonary arterial line and radial arterial line were placed by the anesthesia team. The patient was taken back to the operating room and positioned supine on the operating room table. After being placed under general endotracheal anesthesia by the anesthesia team a foley catheter was placed. The neck, chest, abdomen, and both legs were prepped with betadine soap and solution and draped in the usual sterile manner. A surgical time-out was taken and the correct patient and operative procedure were confirmed with the nursing and anesthesia staff.   TEE:  Performed by Dr. Lillia Abed. This showed mild AI and mild to moderate MR. LV function was moderately depressed with a dilated LV and global hypokinesis.   Cardiopulmonary Bypass:  A median sternotomy was performed. The pericardium was opened in the midline. Right ventricular function appeared normal. The ascending aorta was of normal size and had no palpable plaque. There were no contraindications to aortic cannulation or cross-clamping. The patient was fully systemically heparinized  and the ACT was maintained > 400 sec. The proximal aortic arch was cannulated with a 20 F aortic cannula for arterial inflow. Venous cannulation was  performed via the right atrial appendage using a two-staged venous cannula. An antegrade cardioplegia/vent cannula was inserted into the mid-ascending aorta. Aortic occlusion was performed with a single cross-clamp. Systemic cooling to 32 degrees Centigrade and topical cooling of the heart with iced saline were used. Hyperkalemic antegrade cold blood cardioplegia was used to induce diastolic arrest and was then given at about 20 minute intervals throughout the period of arrest to maintain myocardial temperature at or below 10 degrees centigrade. A temperature probe was inserted into the interventricular septum and an insulating pad was placed in the pericardium.   Left internal mammary harvest:  The left side of the sternum was retracted using the Rultract retractor. The left internal mammary artery was harvested as a pedicle graft. All side branches were clipped. It was a medium-sized vessel of good quality with excellent blood flow. It was ligated distally and divided. It was sprayed with topical papaverine solution to prevent vasospasm.   Endoscopic vein harvest:  The right greater saphenous vein was harvested endoscopically through a 2 cm incision medial to the right knee. It was harvested from the upper thigh to below the knee. It was a medium-sized vein of good quality. The side branches were all ligated with 4-0 silk ties.    Coronary arteries:  The coronary arteries were examined.   LAD:  Diffusely diseased. Diagonal branches are all small  LCX:  OM1 is small and diffusely diseased. OM2 is large, heavily diseased proximally but graftable in the mid portion.  RCA:  Diffusely diseased. The PDA and PL branches are diffusely diseased. There was one area in the distal RCA beyond the PDA origin that was soft enough to graft.   Grafts:  1. LIMA to the LAD: 2.0 mm. It was sewn end to side using 8-0 prolene continuous suture. 2. SVG to OM2:  1.75 mm. It was sewn end to side using 7-0  prolene continuous suture. 3. SVG to distal RCA:  2.0 mm. It was sewn end to side using 7-0 prolene continuous suture.   The proximal vein graft anastomoses were performed to the mid-ascending aorta using continuous 6-0 prolene suture. Graft markers were placed around the proximal anastomoses.   Completion:  The patient was rewarmed to 37 degrees Centigrade. The clamp was removed from the LIMA pedicle and there was rapid warming of the septum and return of ventricular fibrillation. The crossclamp was removed with a time of 59 minutes. There was spontaneous return of sinus rhythm. The distal and proximal anastomoses were checked for hemostasis. The position of the grafts was satisfactory. Two temporary epicardial pacing wires were placed on the right atrium and two on the right ventricle. The patient was weaned from CPB without difficulty on no inotropes. CPB time was 75 minutes. Cardiac output was 6 LPM. Heparin was fully reversed with protamine and the aortic and venous cannulas removed. Hemostasis was achieved. Mediastinal and left pleural drainage tubes were placed. The sternum was closed with double #6 stainless steel wires. The fascia was closed with continuous # 1 vicryl suture. The subcutaneous tissue was closed with 2-0 vicryl continuous suture. The skin was closed with 3-0 vicryl subcuticular suture. All sponge, needle, and instrument counts were reported correct at the end of the case. Dry sterile dressings were placed over the incisions and around the chest tubes which were connected  to pleurevac suction. The patient was then transported to the surgical intensive care unit in critical but stable condition.

## 2014-04-24 NOTE — Procedures (Signed)
Extubation Procedure Note  Patient Details:   Name: Bruce Green DOB: 1919-04-28 MRN: [id number]   Airway Documentation:     Evaluation  O2 sats: stable throughout Complications: No apparent complications Patient did tolerate procedure well. Bilateral Breath Sounds: Clear;Diminished Suctioning: Airway Yes  Pt tolerated wean, 1.5L VC, -20 NIF, positive for cuff leak, extubated to 4lpm [name]. No dyspnea or stridor noted after extubation. All vitals are within normal limits. RT will continue to monitor.   Mariam Dollar 04/24/2014, 7:42 PM

## 2014-04-24 NOTE — Progress Notes (Signed)
Patient transferred to surgery for CABG. Personal Items (goldring, razor, and 1pair underware) at [facility]00's front desk.

## 2014-04-24 NOTE — Transfer of Care (Signed)
Immediate Anesthesia Transfer of Care Note  Patient: Bruce Green  Procedure(s) Performed: Procedure(s) with comments: CORONARY ARTERY BYPASS GRAFTING (CABG) (N/A) - Times 3 using left internal mammary artery and endoscopically harvested right saphenous vein INTRAOPERATIVE TRANSESOPHAGEAL ECHOCARDIOGRAM (N/A)  Patient Location: SICU  Anesthesia Type:General  Level of Consciousness: Patient remains intubated per anesthesia plan  Airway & Oxygen Therapy: Patient remains intubated per anesthesia plan and Patient placed on Ventilator (see vital sign flow sheet for setting)  Post-op Assessment: Post -op Vital signs reviewed and stable  Post vital signs: Reviewed and stable  Complications: No apparent anesthesia complications

## 2014-04-24 NOTE — Brief Op Note (Signed)
04/21/2014 - 04/24/2014  10:26 AM  PATIENT:  Bruce Green  80 y.o. male  PRE-OPERATIVE DIAGNOSIS:  CAD  POST-OPERATIVE DIAGNOSIS:  CAD  PROCEDURE:   CORONARY ARTERY BYPASS GRAFTING x 3 (LIMA-LAD, SVG-OM1, SVG-dRCA) ENDOSCOPIC VEIN HARVEST RIGHT LEG  SURGEON:  Gaye Pollack, MD  ASSISTANT: Suzzanne Cloud, PA-C  ANESTHESIA:   general  PATIENT CONDITION:  ICU - intubated and hemodynamically stable.  PRE-OPERATIVE WEIGHT: 71 kg

## 2014-04-24 NOTE — Progress Notes (Signed)
Echocardiogram Echocardiogram Transesophageal has been performed.  Bruce Green 04/24/2014, 8:30 AM

## 2014-04-24 NOTE — Progress Notes (Signed)
UR Completed.  Vergie Living [id number] 04/24/2014

## 2014-04-24 NOTE — OR Nursing (Signed)
Chest incision made at [phone]

## 2014-04-24 NOTE — OR Nursing (Signed)
SICU first call @ 1059

## 2014-04-24 NOTE — Progress Notes (Signed)
CT surgery p.m. Rounds  Status post CABG today Currently extubated with stable hemodynamics Neuro intact 6 hour post op labs reviewed and are satisfactory

## 2014-04-25 ENCOUNTER — Encounter (HOSPITAL_COMMUNITY): Payer: Self-pay | Admitting: Surgery

## 2014-04-25 ENCOUNTER — Inpatient Hospital Stay (HOSPITAL_COMMUNITY): Payer: Medicare Other

## 2014-04-25 DIAGNOSIS — Z951 Presence of aortocoronary bypass graft: Secondary | ICD-10-CM

## 2014-04-25 DIAGNOSIS — I255 Ischemic cardiomyopathy: Secondary | ICD-10-CM | POA: Diagnosis present

## 2014-04-25 DIAGNOSIS — I2589 Other forms of chronic ischemic heart disease: Secondary | ICD-10-CM

## 2014-04-25 LAB — GLUCOSE, CAPILLARY
GLUCOSE-CAPILLARY: 101 mg/dL — AB (ref 70–99)
GLUCOSE-CAPILLARY: 104 mg/dL — AB (ref 70–99)
GLUCOSE-CAPILLARY: 113 mg/dL — AB (ref 70–99)
GLUCOSE-CAPILLARY: 123 mg/dL — AB (ref 70–99)
GLUCOSE-CAPILLARY: 133 mg/dL — AB (ref 70–99)
Glucose-Capillary: 151 mg/dL — ABNORMAL HIGH (ref 70–99)
Glucose-Capillary: 105 mg/dL — ABNORMAL HIGH (ref 70–99)
Glucose-Capillary: 97 mg/dL (ref 70–99)
Glucose-Capillary: 109 mg/dL — ABNORMAL HIGH (ref 70–99)
Glucose-Capillary: 103 mg/dL — ABNORMAL HIGH (ref 70–99)
Glucose-Capillary: 107 mg/dL — ABNORMAL HIGH (ref 70–99)
Glucose-Capillary: 184 mg/dL — ABNORMAL HIGH (ref 70–99)
Glucose-Capillary: 120 mg/dL — ABNORMAL HIGH (ref 70–99)

## 2014-04-25 LAB — CREATININE, SERUM
Creatinine, Ser: 0.93 mg/dL (ref 0.50–1.35)
GFR calc Af Amer: 82 mL/min — ABNORMAL LOW (ref >90)
GFR calc non Af Amer: 71 mL/min — ABNORMAL LOW (ref >90)

## 2014-04-25 LAB — BASIC METABOLIC PANEL
BUN: 17 mg/dL (ref 6–23)
CALCIUM: 7.9 mg/dL — AB (ref 8.4–10.5)
CHLORIDE: 106 meq/L (ref 96–112)
CO2: 22 meq/L (ref 19–32)
Creatinine, Ser: 0.91 mg/dL (ref 0.50–1.35)
GFR calc Af Amer: 83 mL/min — ABNORMAL LOW (ref >90)
GFR calc non Af Amer: 71 mL/min — ABNORMAL LOW (ref >90)
GLUCOSE: 104 mg/dL — AB (ref 70–99)
Potassium: 4.3 mEq/L (ref 3.7–5.3)
SODIUM: 139 meq/L (ref 137–147)

## 2014-04-25 LAB — CBC
HCT: 28.7 % — ABNORMAL LOW (ref 39.0–52.0)
HEMATOCRIT: 31.9 % — AB (ref 39.0–52.0)
HEMOGLOBIN: 9.1 g/dL — AB (ref 13.0–17.0)
HEMOGLOBIN: 10 g/dL — AB (ref 13.0–17.0)
MCH: 25 pg — AB (ref 26.0–34.0)
MCH: 25 pg — AB (ref 26.0–34.0)
MCHC: 31.7 g/dL (ref 30.0–36.0)
MCHC: 31.3 g/dL (ref 30.0–36.0)
MCV: 78.8 fL (ref 78.0–100.0)
MCV: 79.8 fL (ref 78.0–100.0)
Platelets: 136 10*3/uL — ABNORMAL LOW (ref 150–400)
Platelets: 145 10*3/uL — ABNORMAL LOW (ref 150–400)
RBC: 3.64 MIL/uL — AB (ref 4.22–5.81)
RBC: 4 MIL/uL — ABNORMAL LOW (ref 4.22–5.81)
RDW: 16.7 % — ABNORMAL HIGH (ref 11.5–15.5)
RDW: 16.7 % — AB (ref 11.5–15.5)
WBC: 12.8 10*3/uL — ABNORMAL HIGH (ref 4.0–10.5)
WBC: 13.8 10*3/uL — ABNORMAL HIGH (ref 4.0–10.5)

## 2014-04-25 LAB — POCT I-STAT 3, ART BLOOD GAS (G3+)
Acid-base deficit: 2 mmol/L (ref 0.0–2.0)
Bicarbonate: 22.2 mEq/L (ref 20.0–24.0)
O2 Saturation: 93 %
PCO2 ART: 34.9 mmHg — AB (ref 35.0–45.0)
PH ART: 7.414 (ref 7.350–7.450)
PO2 ART: 68 mmHg — AB (ref 80.0–100.0)
Patient temperature: 37.4
TCO2: 23 mmol/L (ref 0–100)

## 2014-04-25 LAB — POCT I-STAT, CHEM 8
BUN: 19 mg/dL (ref 6–23)
CALCIUM ION: 1.16 mmol/L (ref 1.13–1.30)
Chloride: 103 mEq/L (ref 96–112)
Creatinine, Ser: 1 mg/dL (ref 0.50–1.35)
Glucose, Bld: 216 mg/dL — ABNORMAL HIGH (ref 70–99)
HEMATOCRIT: 35 % — AB (ref 39.0–52.0)
Hemoglobin: 11.9 g/dL — ABNORMAL LOW (ref 13.0–17.0)
POTASSIUM: 3.7 meq/L (ref 3.7–5.3)
Sodium: 139 mEq/L (ref 137–147)
TCO2: 22 mmol/L (ref 0–100)

## 2014-04-25 LAB — MAGNESIUM
Magnesium: 2.6 mg/dL — ABNORMAL HIGH (ref 1.5–2.5)
Magnesium: 2.3 mg/dL (ref 1.5–2.5)

## 2014-04-25 MED ORDER — INSULIN ASPART 100 UNIT/ML ~~LOC~~ SOLN: 0.0000 [IU] | SUBCUTANEOUS | Status: DC

## 2014-04-25 MED ORDER — INSULIN ASPART 100 UNIT/ML ~~LOC~~ SOLN
0.0000 [IU] | SUBCUTANEOUS | Status: DC
  Administered 2014-04-25: 4 [IU] via SUBCUTANEOUS
  Administered 2014-04-25 – 2014-04-26 (×3): 2 [IU] via SUBCUTANEOUS

## 2014-04-25 MED ORDER — POTASSIUM CHLORIDE 10 MEQ/50ML IV SOLN
INTRAVENOUS | Status: AC
  Administered 2014-04-25: 10 meq via INTRAVENOUS
  Filled 2014-04-25: qty 50

## 2014-04-25 MED ORDER — ENOXAPARIN SODIUM 40 MG/0.4ML ~~LOC~~ SOLN
40.0000 mg | Freq: Every day | SUBCUTANEOUS | Status: DC
Start: 2014-04-25 — End: 2014-04-30
  Administered 2014-04-25 – 2014-04-29 (×5): 40 mg via SUBCUTANEOUS
  Filled 2014-04-25 (×6): qty 0.4

## 2014-04-25 MED ORDER — POTASSIUM CHLORIDE 10 MEQ/50ML IV SOLN
10.0000 meq | INTRAVENOUS | Status: AC
  Administered 2014-04-25 (×3): 10 meq via INTRAVENOUS
  Filled 2014-04-25 (×2): qty 50

## 2014-04-25 MED ORDER — METOPROLOL TARTRATE 25 MG PO TABS
25.0000 mg | ORAL_TABLET | Freq: Two times a day (BID) | ORAL | Status: DC
  Administered 2014-04-25 – 2014-04-27 (×4): 25 mg via ORAL
  Filled 2014-04-25 (×5): qty 1

## 2014-04-25 MED ORDER — FUROSEMIDE 10 MG/ML IJ SOLN
40.0000 mg | Freq: Once | INTRAMUSCULAR | Status: AC
  Administered 2014-04-25: 40 mg via INTRAVENOUS
  Filled 2014-04-25: qty 4

## 2014-04-25 MED FILL — Potassium Chloride Inj 2 mEq/ML: INTRAVENOUS | Qty: 40 | Status: AC

## 2014-04-25 MED FILL — Magnesium Sulfate Inj 50%: INTRAMUSCULAR | Qty: 10 | Status: AC

## 2014-04-25 MED FILL — Cefuroxime Sodium For Inj 750 MG: INTRAMUSCULAR | Qty: 750 | Status: AC

## 2014-04-25 MED FILL — Heparin Sodium (Porcine) Inj 1000 Unit/ML: INTRAMUSCULAR | Qty: 30 | Status: AC

## 2014-04-25 NOTE — Progress Notes (Signed)
Patient ID: Bruce Green, male   DOB: 1919-08-20, 80 y.o.   MRN: [id number]  SICU Evening rounds:  hemodynamcically stable. BP has risen so will start lopressor and diurese.  Ambulated well today

## 2014-04-25 NOTE — Progress Notes (Signed)
1 Day Post-Op Procedure(s) (LRB): CORONARY ARTERY BYPASS GRAFTING (CABG) (N/A) INTRAOPERATIVE TRANSESOPHAGEAL ECHOCARDIOGRAM (N/A) Subjective: No complaints. No pain  Objective: Vital signs in last 24 hours: Temp:  [96.3 F (35.7 C)-99.7 F (37.6 C)] 99.3 F (37.4 C) (06/02 0700) Pulse Rate:  [72-94] 78 (06/02 0700) Cardiac Rhythm:  [-] Atrial paced (06/02 0600) Resp:  [10-32] 17 (06/02 0700) BP: (91-146)/(50-81) 113/52 mmHg (06/02 0700) SpO2:  [91 %-100 %] 98 % (06/02 0700) Arterial Line BP: (91-163)/(45-86) 118/49 mmHg (06/02 0700) FiO2 (%):  [40 %-50 %] 40 % (06/01 1804) Weight:  [74.6 kg (164 lb 7.4 oz)] 74.6 kg (164 lb 7.4 oz) (06/02 0500)  Hemodynamic parameters for last 24 hours: PAP: (20-50)/(5-25) 34/12 mmHg CO:  [2.5 L/min-5.1 L/min] 5.1 L/min CI:  [1.3 L/min/m2-2.7 L/min/m2] 2.7 L/min/m2  Intake/Output from previous day: 06/01 0701 - 06/02 0700 In: 4868.9 [I.V.:2983.9; Blood:385; IV Piggyback:1500] Out: 0998 [Urine:4215; Emesis/NG output:150; Blood:1570; Chest Tube:460] Intake/Output this shift:    General appearance: alert and cooperative Neurologic: intact Heart: regular rate and rhythm, S1, S2 normal, no murmur, click, rub or gallop Lungs: rhonchi few bilat Extremities: extremities normal, atraumatic, no cyanosis or edema Wound: dressing dry  Lab Results:  Recent Labs  04/24/14 1830 04/24/14 1840 04/25/14 0420  WBC 16.1*  --  12.8*  HGB 10.0* 10.9* 9.1*  HCT 31.2* 32.0* 28.7*  PLT 144*  --  136*   BMET:  Recent Labs  04/23/14 2230  04/24/14 1840 04/25/14 0420  NA 139  < > 141 139  K 4.5  < > 4.2 4.3  CL 103  --  106 106  CO2 26  --   --  22  GLUCOSE 108*  < > 130* 104*  BUN 23  --  16 17  CREATININE 1.18  < > 0.90 0.91  CALCIUM 9.0  --   --  7.9*  < > = values in this interval not displayed.  PT/INR:  Recent Labs  04/24/14 1200  LABPROT 18.1*  INR 1.54*   ABG    Component Value Date/Time   PHART 7.414 04/25/2014 0410   HCO3  22.2 04/25/2014 0410   TCO2 23 04/25/2014 0410   ACIDBASEDEF 2.0 04/25/2014 0410   O2SAT 93.0 04/25/2014 0410   CBG (last 3)   Recent Labs  04/24/14 2231 04/24/14 2334 04/25/14 0038  GLUCAP 151* 133* 113*    Assessment/Plan: S/P Procedure(s) (LRB): CORONARY ARTERY BYPASS GRAFTING (CABG) (N/A) INTRAOPERATIVE TRANSESOPHAGEAL ECHOCARDIOGRAM (N/A) Wean neo as tolerated. Hold beta blocker until stable off neo Mobilize Diabetes control: Hgb A1c 6.3 preop. Continue SSI. On no meds preop. d/c tubes/lines Continue foley due to patient in ICU and urinary output monitoring See progression orders   LOS: 4 days    Gaye Pollack 04/25/2014

## 2014-04-25 NOTE — Progress Notes (Addendum)
   Cards: Dr. Sallyanne Kuster  Subjective:  Mild chest pain with deep inspiration as to be expected.  Has "rattle" hay fever chronic.   Objective:  Vital Signs in the last 24 hours: Temp:  [96.3 F (35.7 C)-99.7 F (37.6 C)] 99.1 F (37.3 C) (06/02 0800) Pulse Rate:  [72-94] 81 (06/02 0800) Resp:  [10-32] 17 (06/02 0800) BP: (91-146)/(50-81) 115/59 mmHg (06/02 0800) SpO2:  [91 %-100 %] 99 % (06/02 0800) Arterial Line BP: (91-163)/(45-86) 133/55 mmHg (06/02 0800) FiO2 (%):  [40 %-50 %] 40 % (06/01 1804) Weight:  [164 lb 7.4 oz (74.6 kg)] 164 lb 7.4 oz (74.6 kg) (06/02 0500)  Intake/Output from previous day: 06/01 0701 - 06/02 0700 In: 4868.9 [I.V.:2983.9; Blood:385; IV Piggyback:1500] Out: 8921 [Urine:4215; Emesis/NG output:150; Blood:1570; Chest Tube:460]   Physical Exam: General: Well developed, well nourished, in no acute distress. Looks younger than stated age.  Head:  Normocephalic and atraumatic. Lungs: Clear to auscultation and percussion. Mild rhonchi Heart: Normal S1 and S2. Occas ectopy No murmur, rubs or gallops.  Abdomen: soft, non-tender, positive bowel sounds. Extremities: No clubbing or cyanosis. No edema. Neurologic: Alert and oriented x 3.    Lab Results:  Recent Labs  04/24/14 1830 04/24/14 1840 04/25/14 0420  WBC 16.1*  --  12.8*  HGB 10.0* 10.9* 9.1*  PLT 144*  --  136*    Recent Labs  04/23/14 2230  04/24/14 1840 04/25/14 0420  NA 139  < > 141 139  K 4.5  < > 4.2 4.3  CL 103  --  106 106  CO2 26  --   --  22  GLUCOSE 108*  < > 130* 104*  BUN 23  --  16 17  CREATININE 1.18  < > 0.90 0.91  < > = values in this interval not displayed. No results found for this basename: TROPONINI, CK, MB,  in the last 72 hours Telemetry: paced Personally viewed.   Assessment/Plan:  Active Problems:   Unstable angina   S/P CABG x 3  -Ischemic cardiomyopathy EF 25%- hopeful improvement with revasc.  -doing well post op -weaning off neo, BP 150's. Hopeful  start of coreg 3.125mg  BID (cardiomyopathy) soon.  -add ACE-I when comfortable  -spoke with daughter    Candee Furbish 04/25/2014, 10:33 AM

## 2014-04-26 ENCOUNTER — Inpatient Hospital Stay (HOSPITAL_COMMUNITY): Payer: Medicare Other

## 2014-04-26 DIAGNOSIS — I509 Heart failure, unspecified: Secondary | ICD-10-CM

## 2014-04-26 LAB — GLUCOSE, CAPILLARY
GLUCOSE-CAPILLARY: 131 mg/dL — AB (ref 70–99)
GLUCOSE-CAPILLARY: 111 mg/dL — AB (ref 70–99)
Glucose-Capillary: 100 mg/dL — ABNORMAL HIGH (ref 70–99)
Glucose-Capillary: 123 mg/dL — ABNORMAL HIGH (ref 70–99)
Glucose-Capillary: 124 mg/dL — ABNORMAL HIGH (ref 70–99)

## 2014-04-26 LAB — CBC
HCT: 30.5 % — ABNORMAL LOW (ref 39.0–52.0)
Hemoglobin: 9.7 g/dL — ABNORMAL LOW (ref 13.0–17.0)
MCH: 25.3 pg — AB (ref 26.0–34.0)
MCHC: 31.8 g/dL (ref 30.0–36.0)
MCV: 79.6 fL (ref 78.0–100.0)
PLATELETS: 150 10*3/uL (ref 150–400)
RBC: 3.83 MIL/uL — ABNORMAL LOW (ref 4.22–5.81)
RDW: 16.9 % — AB (ref 11.5–15.5)
WBC: 14.7 10*3/uL — ABNORMAL HIGH (ref 4.0–10.5)

## 2014-04-26 LAB — BASIC METABOLIC PANEL
BUN: 21 mg/dL (ref 6–23)
CO2: 25 mEq/L (ref 19–32)
CREATININE: 0.96 mg/dL (ref 0.50–1.35)
Calcium: 8.1 mg/dL — ABNORMAL LOW (ref 8.4–10.5)
Chloride: 102 mEq/L (ref 96–112)
GFR calc Af Amer: 81 mL/min — ABNORMAL LOW (ref >90)
GFR, EST NON AFRICAN AMERICAN: 70 mL/min — AB (ref >90)
Glucose, Bld: 112 mg/dL — ABNORMAL HIGH (ref 70–99)
Potassium: 4.3 mEq/L (ref 3.7–5.3)
SODIUM: 137 meq/L (ref 137–147)

## 2014-04-26 MED ORDER — TRAMADOL HCL 50 MG PO TABS
50.0000 mg | ORAL_TABLET | ORAL | Status: DC | PRN
  Administered 2014-04-26: 100 mg via ORAL
  Filled 2014-04-26: qty 2

## 2014-04-26 MED ORDER — MAGNESIUM HYDROXIDE 400 MG/5ML PO SUSP: 30.0000 mL | Freq: Every day | ORAL | Status: DC | PRN

## 2014-04-26 MED ORDER — MOVING RIGHT ALONG BOOK
Freq: Once | Status: AC
  Administered 2014-04-26: 18:00:00
  Filled 2014-04-26: qty 1

## 2014-04-26 MED ORDER — SODIUM CHLORIDE 0.9 % IJ SOLN
3.0000 mL | Freq: Two times a day (BID) | INTRAMUSCULAR | Status: DC
  Administered 2014-04-26 – 2014-04-29 (×7): 3 mL via INTRAVENOUS

## 2014-04-26 MED ORDER — SODIUM CHLORIDE 0.9 % IV SOLN: 250.0000 mL | INTRAVENOUS | Status: DC | PRN

## 2014-04-26 MED ORDER — OXYCODONE HCL 5 MG PO TABS: 5.0000 mg | ORAL_TABLET | ORAL | Status: DC | PRN

## 2014-04-26 MED ORDER — INSULIN ASPART 100 UNIT/ML ~~LOC~~ SOLN
0.0000 [IU] | Freq: Three times a day (TID) | SUBCUTANEOUS | Status: DC
  Administered 2014-04-27: 2 [IU] via SUBCUTANEOUS

## 2014-04-26 MED ORDER — FUROSEMIDE 40 MG PO TABS
40.0000 mg | ORAL_TABLET | Freq: Every day | ORAL | Status: DC
  Administered 2014-04-26 – 2014-04-27 (×2): 40 mg via ORAL
  Filled 2014-04-26 (×3): qty 1

## 2014-04-26 MED ORDER — SODIUM CHLORIDE 0.9 % IJ SOLN: 3.0000 mL | INTRAMUSCULAR | Status: DC | PRN

## 2014-04-26 MED ORDER — POTASSIUM CHLORIDE CRYS ER 20 MEQ PO TBCR
20.0000 meq | EXTENDED_RELEASE_TABLET | Freq: Every day | ORAL | Status: DC
  Administered 2014-04-26 – 2014-04-27 (×2): 20 meq via ORAL
  Filled 2014-04-26 (×3): qty 1

## 2014-04-26 NOTE — Progress Notes (Signed)
    Cards: Dr. Sallyanne Kuster  Subjective:  Feels better today, jovial.  Mild chest wall pain.   Objective:  Vital Signs in the last 24 hours: Temp:  [97.4 F (36.3 C)-98.8 F (37.1 C)] 97.9 F (36.6 C) (06/03 0827) Pulse Rate:  [67-96] 74 (06/03 0800) Resp:  [19-29] 20 (06/03 0800) BP: (93-161)/(44-93) 110/49 mmHg (06/03 0800) SpO2:  [91 %-100 %] 94 % (06/03 0800) Weight:  [161 lb 6 oz (73.2 kg)] 161 lb 6 oz (73.2 kg) (06/03 0500)  Intake/Output from previous day: 06/02 0701 - 06/03 0700 In: 680.2 [I.V.:430.2; IV Piggyback:250] Out: 8250 [Urine:1780; Chest Tube:75]   Physical Exam: General: Well developed, well nourished, in no acute distress. Looks younger than stated age.  Head:  Normocephalic and atraumatic. Lungs: Clear currently.  Heart: Normal S1 and S2. Occas ectopy No murmur, rubs or gallops.  Abdomen: soft, non-tender, positive bowel sounds. Extremities: No clubbing or cyanosis. No edema. Neurologic: Alert and oriented x 3.    Lab Results:  Recent Labs  04/25/14 1556 04/25/14 1558 04/26/14 0430  WBC 13.8*  --  14.7*  HGB 10.0* 11.9* 9.7*  PLT 145*  --  150    Recent Labs  04/25/14 0420  04/25/14 1558 04/26/14 0430  NA 139  --  139 137  K 4.3  --  3.7 4.3  CL 106  --  103 102  CO2 22  --   --  25  GLUCOSE 104*  --  216* 112*  BUN 17  --  19 21  CREATININE 0.91  < > 1.00 0.96  < > = values in this interval not displayed. No results found for this basename: TROPONINI, CK, MB,  in the last 72 hours  Telemetry: NSR, PAC Personally viewed.   Assessment/Plan:  Active Problems:   Unstable angina   S/P CABG x 3   Cardiomyopathy, ischemic  -Ischemic cardiomyopathy EF 25%- hopeful improvement with revasc.  -off neo, doing well. On metoprolol 25 BID, pressures are tolerating. I would suggest when [place] consolidating to Toprol (heart failure indication).   -add ACE-I when comfortable  -Great attitude.     Candee Furbish 04/26/2014, 10:49 AM

## 2014-04-26 NOTE — Evaluation (Signed)
Physical Therapy Evaluation Patient Details Name: Bruce Green MRN: [id number] DOB: Apr 18, 1919 Today's Date: 04/26/2014   History of Present Illness  Pt s/p CABG x 3. No significant medical history.  Clinical Impression  Pt admitted with above. Pt currently with functional limitations due to the deficits listed below (see PT Problem List).  Pt will benefit from skilled PT to increase their independence and safety with mobility to allow discharge to the venue listed below.       Follow Up Recommendations Home health PT;Supervision - Intermittent    Equipment Recommendations  Other (comment) (to be determined)    Recommendations for Other Services       Precautions / Restrictions Precautions Precautions: Sternal      Mobility  Bed Mobility                  Transfers Overall transfer level: Needs assistance Equipment used: Pushed w/c Transfers: Sit to/from Stand Sit to Stand: Min guard         General transfer comment: verbal cues to use hands on knees to come to stand.   Ambulation/Gait Ambulation/Gait assistance: Min guard Ambulation Distance (Feet): 700 Feet Assistive device:  (pushing w/c) Gait Pattern/deviations: Step-through pattern;Decreased step length - right;Decreased step length - left     General Gait Details: Pushing w/c for support and balance.  Stairs            Wheelchair Mobility    Modified Rankin (Stroke Patients Only)       Balance Overall balance assessment: Needs assistance         Standing balance support: No upper extremity supported Standing balance-Leahy Scale: Fair                               Pertinent Vitals/Pain VSS    Home Living Family/patient expects to be discharged to:: Private residence Living Arrangements: Alone Available Help at Discharge: Family;Available 24 hours/day (will initially provide 24 hour assist ) Type of Home: House Home Access: Stairs to enter Entrance Stairs-Rails:  Right;Left Entrance Stairs-Number of Steps: 3 Home Layout: Laundry or work area in [employer]: None      Prior Function Level of Independence: Independent         Comments: Active and still drives.     Hand Dominance        Extremity/Trunk Assessment   Upper Extremity Assessment: Overall WFL for tasks assessed           Lower Extremity Assessment: Overall WFL for tasks assessed         Communication   Communication: No difficulties  Cognition Arousal/Alertness: Awake/alert Behavior During Therapy: WFL for tasks assessed/performed Overall Cognitive Status: Within Functional Limits for tasks assessed                      General Comments      Exercises        Assessment/Plan    PT Assessment Patient needs continued PT services  PT Diagnosis Difficulty walking   PT Problem List Decreased mobility;Decreased balance  PT Treatment Interventions DME instruction;Gait training;Balance training;Patient/family education;Functional mobility training;Therapeutic exercise;Therapeutic activities   PT Goals (Current goals can be found in the Care Plan section) Acute Rehab PT Goals Patient Stated Goal: return home PT Goal Formulation: With patient Time For Goal Achievement: 05/03/14 Potential to Achieve Goals: Good    Frequency Min 3X/week   Barriers to discharge  Co-evaluation               End of Session   Activity Tolerance: Patient tolerated treatment well Patient left: in chair;with call bell/phone within reach;with nursing/sitter in room Nurse Communication: Mobility status         Time: 2703-5009 PT Time Calculation (min): 22 min   Charges:   PT Evaluation $Initial PT Evaluation Tier I: 1 Procedure PT Treatments $Gait Training: 8-22 mins   PT G CodesShary Decamp Calene Paradiso 04/26/2014, 12:02 PM  [employer] PT [phone]

## 2014-04-26 NOTE — Progress Notes (Signed)
Pt transferred to 2W-25 via wheelchair, meds in chart, belongings with pt, family notified of room change. VS stable at time of transfer, beside handoff to [place], [place]. No questions or concerns at this time.  Thelma Comp

## 2014-04-26 NOTE — Progress Notes (Addendum)
[facility] DAILY ICU PROGRESS NOTE                   [address]            [place] [address]          [phone]   2 Days Post-Op Procedure(s) (LRB): CORONARY ARTERY BYPASS GRAFTING (CABG) (N/A) INTRAOPERATIVE TRANSESOPHAGEAL ECHOCARDIOGRAM (N/A)  Total Length of Stay:  LOS: 5 days   Subjective: OOB in chair.  Feels well, had some pain when he awoke, but comfortable at present. Rattling cough, nonproductive.   Objective: Vital signs in last 24 hours: Temp:  [97.4 F (36.3 C)-99.3 F (37.4 C)] 98.8 F (37.1 C) (06/03 0400) Pulse Rate:  [67-96] 73 (06/03 0700) Cardiac Rhythm:  [-] Normal sinus rhythm (06/03 0800) Resp:  [15-29] 21 (06/03 0700) BP: (93-161)/(44-93) 102/53 mmHg (06/03 0700) SpO2:  [91 %-100 %] 91 % (06/03 0700) Arterial Line BP: (138)/(64) 138/64 mmHg (06/02 0900) Weight:  [161 lb 6 oz (73.2 kg)] 161 lb 6 oz (73.2 kg) (06/03 0500)  Filed Weights   04/24/14 0546 04/25/14 0500 04/26/14 0500  Weight: 156 lb 8 oz (70.988 kg) 164 lb 7.4 oz (74.6 kg) 161 lb 6 oz (73.2 kg)  PRE-OPERATIVE WEIGHT: 71 kg   Weight change: -3 lb 1.4 oz (-1.4 kg)   Hemodynamic parameters for last 24 hours: PAP: (34)/(16) 34/16 mmHg  Intake/Output from previous day: 06/02 0701 - 06/03 0700 In: 680.2 [I.V.:430.2; IV Piggyback:250] Out: 4696 [Urine:1780; Chest Tube:75]  Intake/Output this shift: Total I/O In: 20 [I.V.:20] Out: 40 [Urine:40]  Current Meds: Scheduled Meds: . acetaminophen  1,000 mg Oral 4 times per day   Or  . acetaminophen (TYLENOL) oral liquid 160 mg/5 mL  1,000 mg Per Tube 4 times per day  . aspirin EC  325 mg Oral Daily   Or  . aspirin  324 mg Per Tube Daily  . atorvastatin  80 mg Oral q1800  . bisacodyl  10 mg Oral Daily   Or  . bisacodyl  10 mg Rectal Daily  . docusate sodium  200 mg Oral Daily  . enoxaparin (LOVENOX) injection  40 mg Subcutaneous QHS  . insulin aspart  0-24 Units Subcutaneous 6 times per day  . metoprolol tartrate   25 mg Oral BID  . pantoprazole  40 mg Oral Daily  . sodium chloride  3 mL Intravenous Q12H   Continuous Infusions: . sodium chloride Stopped (04/25/14 0800)  . sodium chloride Stopped (04/25/14 0800)  . sodium chloride    . lactated ringers 20 mL/hr at 04/25/14 1900  . phenylephrine (NEO-SYNEPHRINE) Adult infusion Stopped (04/25/14 0730)   PRN Meds:.ondansetron (ZOFRAN) IV, sodium chloride  CBGs [id number]   Physical Exam: General appearance: alert, cooperative and no distress Heart: regular rate and rhythm Lungs: Few coarse BS that clear with cough Abdomen: soft, non-tender; bowel sounds normal; no masses,  no organomegaly Wound: Dressed and dry   Lab Results: CBC: Recent Labs  04/25/14 1556 04/25/14 1558 04/26/14 0430  WBC 13.8*  --  14.7*  HGB 10.0* 11.9* 9.7*  HCT 31.9* 35.0* 30.5*  PLT 145*  --  150   BMET:  Recent Labs  04/25/14 0420  04/25/14 1558 04/26/14 0430  NA 139  --  139 137  K 4.3  --  3.7 4.3  CL 106  --  103 102  CO2 22  --   --  25  GLUCOSE 104*  --  216*  112*  BUN 17  --  19 21  CREATININE 0.91  < > 1.00 0.96  CALCIUM 7.9*  --   --  8.1*  < > = values in this interval not displayed.  PT/INR:  Recent Labs  04/24/14 1200  LABPROT 18.1*  INR 1.54*   Radiology: Dg Chest Port 1 View  04/26/2014   CLINICAL DATA:  Coronary artery disease  EXAM: PORTABLE CHEST - 1 VIEW  COMPARISON:  April 25, 2014  FINDINGS: The right apical pneumothorax has become larger without tension component. There has been removal of a left chest tube without appreciable pneumothorax on the left. Swan-Ganz catheter has been removed; Cordis tip is in the superior vena cava. Mediastinal drain is been removed.  There is patchy bibasilar atelectatic change as well as mild right mid lung atelectasis. Heart is enlarged with pulmonary vascularity within normal limits.  IMPRESSION: Pneumothorax on the right larger without tension component. Patchy atelectatic change  bilaterally.  Critical Value/emergent results were called by telephone at the time of interpretation on 04/26/2014 at 7:32 AM to Vickii Penna, RN , who verbally acknowledged these results and agreed to communicate directly with the covering physician when he arrives for rounds.   Electronically Signed   By: Lowella Grip M.D.   On: 04/26/2014 07:34   Dg Chest Portable 1 View In Am  04/25/2014   CLINICAL DATA:  Status post coronary bypass grafting  EXAM: PORTABLE CHEST - 1 VIEW  COMPARISON:  04/24/2014, 04/06/2014  FINDINGS: Cardiac shadow is stable. The endotracheal tube and nasogastric catheter have been removed. A mediastinal drain, Swan-Ganz catheter and left thoracostomy catheter remain. A drain overlying the lower aspect of the cardiac shadow is seen on the inferior most aspect of the film and stable. A right-sided nodular density is again identified. This likely represents a nipple shadow as it was not seen on recent CT from 2 weeks previous. No focal infiltrate is noted. There is suggestion of a tiny right pneumothorax. Clearing in the left base is noted.  IMPRESSION: Tubes and lines as described above.  Nodular density over the right lung base stable likely representing a nipple shadow.  Questionable small pneumothorax in the right apex. Only minimal excursion is noted.  Critical Value/emergent results were called by telephone at the time of interpretation on 04/25/2014 at 7:45 AM to [facility], the patients nurse, who verbally acknowledged these results.   Electronically Signed   By: Inez Catalina M.D.   On: 04/25/2014 07:48   Dg Chest Portable 1 View  04/24/2014   CLINICAL DATA:  Postop  EXAM: PORTABLE CHEST - 1 VIEW  COMPARISON:  Portable exam 1211 hr compared to 04/24/2014 and CT chest 04/06/2014  FINDINGS: Tip of endotracheal tube projects approximately 7.3 cm above carina.  Nasogastric tube extends into stomach.  Mediastinal drain and LEFT thoracostomy tube present.  RIGHT jugular Swan-Ganz catheter  tip projects over RIGHT pulmonary artery.  Enlargement of cardiac silhouette post interval CABG.  Nodular density projects over lower RIGHT lung question nipple shadow versus superimposed artifact soft tissue, with no pulmonary nodule identified on recent CT.  Bibasilar atelectasis.  No acute edema, pleural effusion or pneumothorax.  IMPRESSION: Postoperative changes as above.   Electronically Signed   By: Lavonia Dana M.D.   On: 04/24/2014 12:31     Assessment/Plan: S/P Procedure(s) (LRB): CORONARY ARTERY BYPASS GRAFTING (CABG) (N/A) INTRAOPERATIVE TRANSESOPHAGEAL ECHOCARDIOGRAM (N/A)  CV- BPs, HR stable, off drips.  Continue beta blocker.  Pulm- Continue pulm  toilet, will add FV, Mucinex. Small R ptx, generally stable on today's film.  Will repeat CXR in am and follow.  Elevated CBGs - continue SSI.  CBGs fairly well controlled. A1C=6.3.  Vol overload- diurese.  CRPI, ambulation.  Transfer PTCU.   Coolidge Breeze 04/26/2014 8:13 AM   Chart reviewed, patient examined, agree with above.

## 2014-04-26 NOTE — Progress Notes (Signed)
Attempted to call report x 2. IV team notified.

## 2014-04-27 ENCOUNTER — Inpatient Hospital Stay (HOSPITAL_COMMUNITY): Payer: Medicare Other

## 2014-04-27 LAB — CBC
HEMATOCRIT: 30.3 % — AB (ref 39.0–52.0)
Hemoglobin: 9.5 g/dL — ABNORMAL LOW (ref 13.0–17.0)
MCH: 24.6 pg — ABNORMAL LOW (ref 26.0–34.0)
MCHC: 31.4 g/dL (ref 30.0–36.0)
MCV: 78.5 fL (ref 78.0–100.0)
Platelets: 174 10*3/uL (ref 150–400)
RBC: 3.86 MIL/uL — ABNORMAL LOW (ref 4.22–5.81)
RDW: 17 % — ABNORMAL HIGH (ref 11.5–15.5)
WBC: 12.7 10*3/uL — ABNORMAL HIGH (ref 4.0–10.5)

## 2014-04-27 LAB — BASIC METABOLIC PANEL
BUN: 27 mg/dL — AB (ref 6–23)
CHLORIDE: 102 meq/L (ref 96–112)
CO2: 26 mEq/L (ref 19–32)
Calcium: 8.1 mg/dL — ABNORMAL LOW (ref 8.4–10.5)
Creatinine, Ser: 1.03 mg/dL (ref 0.50–1.35)
GFR calc Af Amer: 71 mL/min — ABNORMAL LOW (ref >90)
GFR, EST NON AFRICAN AMERICAN: 61 mL/min — AB (ref >90)
GLUCOSE: 128 mg/dL — AB (ref 70–99)
POTASSIUM: 4.2 meq/L (ref 3.7–5.3)
Sodium: 137 mEq/L (ref 137–147)

## 2014-04-27 LAB — GLUCOSE, CAPILLARY
GLUCOSE-CAPILLARY: 122 mg/dL — AB (ref 70–99)
Glucose-Capillary: 124 mg/dL — ABNORMAL HIGH (ref 70–99)

## 2014-04-27 MED ORDER — LISINOPRIL 2.5 MG PO TABS
2.5000 mg | ORAL_TABLET | Freq: Every day | ORAL | Status: DC
  Administered 2014-04-27 – 2014-04-30 (×4): 2.5 mg via ORAL
  Filled 2014-04-27 (×4): qty 1

## 2014-04-27 MED ORDER — METOPROLOL TARTRATE 25 MG PO TABS
25.0000 mg | ORAL_TABLET | Freq: Once | ORAL | Status: AC
  Administered 2014-04-27: 25 mg via ORAL
  Filled 2014-04-27: qty 1

## 2014-04-27 MED ORDER — ACETAMINOPHEN 325 MG PO TABS
650.0000 mg | ORAL_TABLET | ORAL | Status: DC | PRN
  Administered 2014-04-30: 650 mg via ORAL
  Filled 2014-04-27: qty 2

## 2014-04-27 MED ORDER — CARVEDILOL 3.125 MG PO TABS
3.1250 mg | ORAL_TABLET | Freq: Two times a day (BID) | ORAL | Status: DC
  Filled 2014-04-27 (×3): qty 1

## 2014-04-27 MED ORDER — FOLIC ACID 1 MG PO TABS
1.0000 mg | ORAL_TABLET | Freq: Every day | ORAL | Status: DC
  Administered 2014-04-27 – 2014-04-30 (×4): 1 mg via ORAL
  Filled 2014-04-27 (×4): qty 1

## 2014-04-27 MED ORDER — FERROUS SULFATE 325 (65 FE) MG PO TABS
325.0000 mg | ORAL_TABLET | Freq: Two times a day (BID) | ORAL | Status: DC
  Administered 2014-04-27 – 2014-04-30 (×6): 325 mg via ORAL
  Filled 2014-04-27 (×9): qty 1

## 2014-04-27 NOTE — Progress Notes (Signed)
RIJ D/C'd per order. Pressure applied.  Occlusive dsg applied. Pt instructed of bedrest. Verbalized understanding. Will continue to monitor pt closely.

## 2014-04-27 NOTE — Discharge Instructions (Signed)
Activity: 1.May walk up steps °               2.No lifting more than ten pounds for four weeks.  °               3.No driving for four weeks. °               4.Stop any activity that causes chest pain, shortness of breath, dizziness,                            sweating or excessive weakness. °               5.Avoid straining. °               6.Continue with your breathing exercises daily. ° °Diet: Diabetic diet and Low fat, Low salt  diet ° °Wound Care: May shower.  Clean wounds with mild soap and water daily. Contact the office at [phone] if any problems arise. ° °Coronary Artery Bypass Grafting, Care After °Refer to this sheet in the next few weeks. These instructions provide you with information on caring for yourself after your procedure. Your health care provider may also give you more specific instructions. Your treatment has been planned according to current medical practices, but problems sometimes occur. Call your health care provider if you have any problems or questions after your procedure. °WHAT TO EXPECT AFTER THE PROCEDURE °Recovery from surgery will be different for everyone. Some people feel well after 3 or 4 weeks, while for others it takes longer. After your procedure, it is typical to have the following: °· Nausea and a lack of appetite.   °· Constipation. °· Weakness and fatigue.   °· Depression or irritability.   °· Pain or discomfort at your incision site. °HOME CARE INSTRUCTIONS °· Only take over-the-counter or prescription medicines as directed by your health care provider. Take all medicines exactly as directed. Do not stop taking medicines or start any new medicines without first checking with your health care provider.   °· Take your pulse as directed by your health care provider. °· Perform deep breathing as directed by your health care provider. If you were given a device called an incentive spirometer, use it to practice deep breathing several times a day. Support your chest with  a pillow or your arms when you take deep breaths or cough. °· Keep incision areas clean, dry, and protected. Remove or change any bandages (dressings) only as directed by your health care provider. You may have skin adhesive strips over the incision areas. Do not take the strips off. They will fall off on their own. °· Check incision areas daily for any swelling, redness, or drainage. °· If incisions were made in your legs, do the following: °· Avoid crossing your legs.   °· Avoid sitting for long periods of time. Change positions every 30 minutes.   °· Elevate your legs when you are sitting.   °· Wear compression stockings as directed by your health care provider. These stockings help keep blood clots from forming in your legs. °· Take showers once your health care provider approves. Until then, only take sponge baths. Pat incisions dry. Do not rub incisions with a washcloth or towel. Do not take tub baths or go swimming until your health care provider approves. °· Eat foods that are high in fiber, such as raw fruits and vegetables, whole grains, beans, and nuts. Meats should be lean cut. Avoid   canned, processed, and fried foods. °· Drink enough fluids to keep your urine clear or pale yellow. °· Weigh yourself every day. This helps identify if you are retaining fluid that may make your heart and lungs work harder.   °· Rest and limit activity as directed by your health care provider. You may be instructed to: °· Stop any activity at once if you have chest pain, shortness of breath, irregular heartbeats, or dizziness. Get help right away if you have any of these symptoms. °· Move around frequently for short periods or take short walks as directed by your health care provider. Increase your activities gradually. You may need physical therapy or cardiac rehabilitation to help strengthen your muscles and build your endurance. °· Avoid lifting, pushing, or pulling anything heavier than 10 lb (4.5 kg) for at least 6  weeks after surgery. °· Do not drive until your health care provider approves.  °· Ask your health care provider when you may return to work and resume sexual activity. °· Follow up with your health care provider as directed.   °SEEK MEDICAL CARE IF: °· You have swelling, redness, increasing pain, or drainage at the site of an incision.   °· You develop a fever.   °· You have swelling in your ankles or legs.   °· You have pain in your legs.   °· You have weight gain of 2 or more pounds a day. °· You are nauseous or vomit. °· You have diarrhea.  °SEEK IMMEDIATE MEDICAL CARE IF: °· You have chest pain that goes to your jaw or arms. °· You have shortness of breath.   °· You have a fast or irregular heartbeat.   °· You notice a "clicking" in your breastbone (sternum) when you move.   °· You have numbness or weakness in your arms or legs. °· You feel dizzy or lightheaded.   °MAKE SURE YOU: °· Understand these instructions. °· Will watch your condition. °· Will get help right away if you are not doing well or get worse. °Document Released: 05/30/2005 Document Revised: 07/13/2013 Document Reviewed: 04/19/2013 °[employer]® Patient Information ©2014 [employer]. ° ° ° °

## 2014-04-27 NOTE — Progress Notes (Signed)
    Cards: Dr. Sallyanne Kuster  Subjective:  Feels better today, jovial.  Mild chest wall pain. No complaints otherwise   Objective:  Vital Signs in the last 24 hours: Temp:  [97.7 F (36.5 C)-98.8 F (37.1 C)] 98.2 F (36.8 C) (06/04 0523) Pulse Rate:  [67-90] 78 (06/04 0523) Resp:  [18-23] 18 (06/04 0523) BP: (94-146)/(49-83) 144/71 mmHg (06/04 0523) SpO2:  [93 %-99 %] 97 % (06/04 0523) Weight:  [160 lb 15 oz (73 kg)] 160 lb 15 oz (73 kg) (06/04 0750)  Intake/Output from previous day: 06/03 0701 - 06/04 0700 In: 660 [P.O.:480; I.V.:180] Out: 665 [Urine:665]   Physical Exam: General: Well developed, well nourished, in no acute distress. Looks younger than stated age.  Head:  Normocephalic and atraumatic. Lungs: Clear currently.  Heart: Normal S1 and S2. Occas ectopy No murmur, rubs or gallops.  Abdomen: soft, non-tender, positive bowel sounds. Extremities: No clubbing or cyanosis. No edema. Neurologic: Alert and oriented x 3.    Lab Results:  Recent Labs  04/26/14 0430 04/27/14 0335  WBC 14.7* 12.7*  HGB 9.7* 9.5*  PLT 150 174    Recent Labs  04/26/14 0430 04/27/14 0335  NA 137 137  K 4.3 4.2  CL 102 102  CO2 25 26  GLUCOSE 112* 128*  BUN 21 27*  CREATININE 0.96 1.03     Telemetry: NSR, PAC Personally viewed.   Assessment/Plan:  Active Problems:   Unstable angina   S/P CABG x 3   Cardiomyopathy, ischemic  -Ischemic cardiomyopathy EF 25%- hopeful improvement with revasc.  -On metoprolol 25 BID, pressures are tolerating. I would suggest when Doffing consolidating to Toprol or coreg (heart failure indication).   -added ACE-I, lisinopril  -Great attitude.   -Have follow up with Dr. Renae Fickle [facility] 04/27/2014, 9:37 AM

## 2014-04-27 NOTE — Progress Notes (Signed)
CARDIAC REHAB PHASE I   PRE:  Rate/Rhythm: 74 SR PACs  BP:  Supine: 124/50  Sitting:   Standing:    SaO2: 95%RA  MODE:  Ambulation: 540 ft   POST:  Rate/Rhythm: 100 SR PACs  BP:  Supine:   Sitting: 150/80  Standing:    SaO2: 94%RA 0930-1003 Pt walked 540 ft on RA with rolling walker. Could have gone farther but needed to use urinal. To recliner after walk. Would recommend rolling walker for home use. Discussed with pt's RN. Call bell in reach. Tolerated walk well.   Graylon Good, RN BSN  04/27/2014 9:59 AM

## 2014-04-27 NOTE — Discharge Summary (Signed)
Physician Discharge Summary       [address] [place]       ,[place] [address]             [phone]    Patient ID: Bruce Green MRN: [id number] DOB/AGE: 78/23/1920 78 y.o.  Admit date: 04/21/2014 Discharge date: 04/30/2014  Admission Diagnoses: 1. 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 History of GERD 3. History of distal esophagus stricture 4. History of hiatal hernia  Discharge Diagnoses:  1. Multivessel CAD(LVEF 25-30%) 2. History of GERD 3. History of distal esophagus stricture 4. History of hiatal hernia 5. ABL anemia   Procedure (s):  Cardiac catheterization done by Dr. Julianne Handler on 04/21/2014: Angiographic Findings:  Left main: Ostial 50-60% stenosis.  Left Anterior Descending Artery: Large caliber vessel that courses to the apex. The proximal and mid segments are heavily calcified. The proximal vessel has an eccentric, calcified 80% stenosis. The mid vessel is calcified with diffuse 60% stenosis followed by focal 99% stenosis. The distal vessel has diffuse 20-30% stenosis. The diagonal branch is small to moderate in caliber with proximal 80% stenosis.  Circumflex Artery: Large caliber vessel with two moderate caliber obtuse marginal branches. The AV groove Circumflex has mild diffuse plaque. The first OM is a moderate caliber vessel with ostial 70% stenosis. The second OM branch is a moderate caliber vessel with proximal 99% stenosis, long 99% stenosis mid segment.  Right Coronary Artery: Large dominant vessel with diffuse 40% proximal stenosis. The mid vessel has 100% sub-total occlusion. The distal vessel fills from bridging collaterals. The distal vessel has diffuse 40% stenosis. A distal branch fills from left to right collaterals.  Left Ventricular Angiogram: Deferred  1. Median Sternotomy 2. Extracorporeal circulation 3. Coronary artery bypass grafting x 3  Left internal mammary graft to the LAD  SVG to OM2  SVG to distal RCA 4. Endoscopic vein harvest  from the right leg by Dr. Cyndia Bent on 04/24/2014     History of Presenting Illness: The patient is a fairly healthy, active and independent 80 year old gentleman who has no prior cardiac history but presents with a several month history of progressive exertional fatigue and shortness of breath. This has limited his activity over this period of time and he can't work in his yard for more than 15 minutes without having to take a break. He has had no chest discomfort. He had a CT scan of the chest to rule out PE which showed none but showed cardiomegaly and significant coronary calcification. He had an echo showed a reduced EF of 25-30% with global hypokinesis, mild AI, mild MR. Cardiac cath today shows 50-60% ostial LM and severe 3-vessel CAD with 99% mid LAD, 70% OM1, 99% OM2, and 100% occlusion of a large dominant RCA with bridging collaterals. PA was 33/7 with a PCWP of 12 and CI 2.4. PA sat 69%. ECG shows sinus rhythm with preserved R-waves. A cardiothoracic consultation was obtained with Dr. Cyndia Bent for the consideration of coronary artery bypass grafting surgery. Potential risks, benefits, and complications of the surgery were discussed with the patient and he agreed to proceed with surgery. Pre operative carotid duplex US showed no significant carotid artery stenosis bilaterally. ABI's were 0.87 on the right and 1.05 on the left. He underwent a CABG x 3 on 04/24/2014.   Brief Hospital Course:  The patient was extubated the evening of surgery without difficulty. He remained afebrile and hemodynamically stable. He was weaned off of Neo synephrine. [name], a line, chest  tubes, and foley were removed early in the post operative course. Lopressor was started and titrated accordingly. This was then changed to Coreg as pre op LVEF 25-30%. He was volume over loaded and diuresed. He did have ABL anemia post op. His last H nad H was 9.5 and 30.3. He was started on folic acid and ferrous sulfate. He did not require  a post op transfusion. He was weaned off the insulin drip. The patient's HGA1C pre op was 6.3. He was instructed to continue with a diabetic diet.He will need further surveillance as an outpatient.The patient was felt surgically stable for transfer from the ICU to PCTU for further convalescence on 04/26/2013.  He continues to progress with cardiac rehab. He is ambulating on room air. He has been tolerating a diet and has had a bowel movement. He did have several brief runs of aberrant atrial tachycardia/atrial fibrillation as well as PVCs. His Coreg was increased to 6.25 bid and he has been started on Digoxin every other day. It was felt that he should not be started on Amiodarone or Cardizem in light of his age and low EF. Epicardial pacing wires have been removed. Chest tube sutures will be removed in the office. The patient is surgically stable for discharge on 04/30/2013.   Latest Vital Signs: Blood pressure 134/52, pulse 79, temperature 98.2 F (36.8 C), temperature source Oral, resp. rate 18, height 5\' 10"  (1.778 m), weight 157 lb 1.6 oz (71.26 kg), SpO2 94.00%.  Physical Exam: Cardiovascular: RRR  Pulmonary: Slightly diminished at bases; no rales, wheezes, or rhonchi.  Abdomen: Soft, non tender, bowel sounds present.  Extremities: Trace lower extremity edema.  Wounds: Clean and dry. No erythema or signs of infection   Discharge Condition:Stable    Recent laboratory studies:  Lab Results  Component Value Date   WBC 12.7* 04/27/2014   HGB 9.5* 04/27/2014   HCT 30.3* 04/27/2014   MCV 78.5 04/27/2014   PLT 174 04/27/2014   Lab Results  Component Value Date   NA 137 04/27/2014   K 4.2 04/27/2014   CL 102 04/27/2014   CO2 26 04/27/2014   CREATININE 1.03 04/27/2014   GLUCOSE 128* 04/27/2014      Diagnostic Studies: Dg Chest 2 View  04/27/2014   CLINICAL DATA:  R pneumothorax, s/p CABG  EXAM: CHEST  2 VIEW  COMPARISON:  Portal chest radiograph 04/26/2014  FINDINGS: Low lung volumes. The right  apical pneumothorax has decreased in size when compared to the previous study small is residua appreciated. Cores tip stable in the superior vena cava. Stable mild areas of patchy atelectasis. Stable nodular densities within the right hemithorax. No new focal regions of consolidation. Degenerative changes of the shoulders. Patient is status post median sternotomy coronary artery bypass grafting. Cardiac silhouette is enlarged.  IMPRESSION: Decreased size of the right apical pneumothorax with small residua.  Chest radiograph otherwise stable.   Electronically Signed   By: Margaree Mackintosh M.D.   On: 04/27/2014 07:55    Ct Angio Chest Pe W/cm &/or Wo Cm  04/06/2014   CLINICAL DATA:  Shortness of breath with exacerbation over the past 3 months. Nodular density on recent chest x-ray. Elevated D-dimer. Esophageal stricture.  EXAM: CT ANGIOGRAPHY CHEST WITH CONTRAST  TECHNIQUE: Multidetector CT imaging of the chest was performed using the standard protocol during bolus administration of intravenous contrast. Multiplanar CT image reconstructions and MIPs were obtained to evaluate the vascular anatomy.  CONTRAST:  127mL OMNIPAQUE IOHEXOL 350 MG/ML SOLN  COMPARISON:  04/05/2014 chest x-ray.  FINDINGS: Scattered pulmonary parenchymal changes suggestive of scarring/ subsegmental atelectasis. No worrisome lung mass identified. What was noted on chest x-ray may have represented complication shadows. Incidentally noted is what appears to be a small lymph node in the left major fissure.  No pulmonary embolus detected.  Cardiomegaly with left ventricular prominence.  Coronary artery calcifications.  Atherosclerotic type changes of the thoracic aorta with prominent irregular plaque most notable descending thoracic aorta which is ectatic.  Small hiatal hernia.  No mediastinal or hilar adenopathy.  New bony destructive lesion.  Visualized upper abdominal structures unremarkable.  Review of the MIP images confirms the above findings.   IMPRESSION: Scattered pulmonary parenchymal changes suggestive of scarring/ subsegmental atelectasis. No worrisome lung mass identified. What was noted on chest x-ray may have represented complication shadows.  No pulmonary embolus detected.  Cardiomegaly with left ventricular prominence.  Coronary artery calcifications.  Atherosclerotic type changes of the thoracic aorta with prominent irregular plaque most notable descending thoracic aorta which is ectatic.  Small hiatal hernia.  This is a call report.   Electronically Signed   By: Chauncey Cruel M.D.   On: 04/06/2014 13:34    Discharge Medications:   Medication List    STOP taking these medications       naproxen sodium 220 MG tablet  Commonly known as:  ANAPROX      TAKE these medications       acetaminophen 325 MG tablet  Commonly known as:  TYLENOL  Take 2 tablets (650 mg total) by mouth every 4 (four) hours as needed for mild pain or moderate pain.     aspirin 325 MG EC tablet  Take 1 tablet (325 mg total) by mouth daily.     atorvastatin 80 MG tablet  Commonly known as:  LIPITOR  Take 1 tablet (80 mg total) by mouth daily at 6 PM.     B-COMPLEX/VITAMIN C PO  Take 1 tablet by mouth daily.     CALCIUM PO  Take 1 tablet by mouth daily.     carvedilol 6.25 MG tablet  Commonly known as:  COREG  Take 1 tablet (6.25 mg total) by mouth 2 (two) times daily with a meal.     digoxin 0.125 MG tablet  Commonly known as:  LANOXIN  Take 1 tablet (0.125 mg total) by mouth every other day.     ferrous sulfate 325 (65 FE) MG tablet  Take 1 tablet (325 mg total) by mouth daily with breakfast. For one month then stop.     folic acid 1 MG tablet  Commonly known as:  FOLVITE  Take 1 tablet (1 mg total) by mouth daily. For one month then stop.     lisinopril 2.5 MG tablet  Commonly known as:  PRINIVIL,ZESTRIL  Take 1 tablet (2.5 mg total) by mouth daily.     pantoprazole 40 MG tablet  Commonly known as:  PROTONIX  Take 40 mg by  mouth daily.         The patient has been discharged on:   1.Beta Blocker:  Yes [ x  ]                              No   [   ]  If No, reason:  2.Ace Inhibitor/ARB: Yes [ x  ]                                     No  [    ]                                     If No, reason:  3.Statin:   Yes [ x  ]                  No  [   ]                  If No, reason:  4.Ecasa:  Yes  [  x ]                  No   [   ]                  If No, reason:    Follow Up Appointments: Follow-up Information   Follow up with [facility] R, NP On 05/17/2014. (Appointment time is at 9:00 am)    Specialty:  Cardiology   Contact information:   [address] [place] [address] [phone]       Follow up with  Melinda Crutch, MD. (Call for a follow up appointment regarding further surveillance of HGA1C 6.3)    Specialty:  Family Medicine   Contact information:   [address] [place] [address] [phone]       Follow up with Gaye Pollack, MD On 05/31/2014. (PA/LAT CXR to be taken at ([facility] which is in the same building as Dr. Vivi Martens office) on 05/31/2014 at 11:30 am;Appointment time is at 12:30 pm)    Specialty:  Cardiothoracic Surgery   Contact information:   [address] [place] [address] [phone]       Follow up with Gaye Pollack, MD On 05/04/2014. (Appointment is with nurse only for chest tube suture removal only. Appointment time is at 10:30 am)    Specialty:  Cardiothoracic Surgery   Contact information:   [place] [address] [phone]       Signed: Sharalyn Ink [facility] 04/28/2014, 8:30 AM

## 2014-04-27 NOTE — Care Management Note (Signed)
    Page 1 of 2   04/27/2014     2:04:42 PM CARE MANAGEMENT NOTE 04/27/2014  Patient:  Bruce Green, Bruce Green   Account Number:  [id number]  Date Initiated:  04/24/2014  Documentation initiated by:  Luz Lex  Subjective/Objective Assessment:   Post op CABG  PTA, pt lives alone and is independent     Action/Plan:   Daughters to provide care at [employer]; they plan to also hire private duty sitter for 2 weeks post dc.   Anticipated DC Date:  04/28/2014   Anticipated DC Plan:  [place]  CM consult      [facility] Choice  HOME HEALTH   Choice offered to / List presented to:  C-1 Patient   DME arranged  [facility]      DME agency  [place] arranged  HH-1 RN  [place].   Status of service:  Completed, signed off Medicare Important Message given?  YES (If response is "NO", the following Medicare IM given date fields will be blank) Date Medicare IM given:  04/23/2014 Date Additional Medicare IM given:  04/26/2014  Discharge Disposition:  [place]  Per UR Regulation:  Reviewed for med. necessity/level of care/duration of stay  If discussed at [place] of Stay Meetings, dates discussed:    Comments:  Contact:  Proctor,[name] Daughter [phone]   [facility] Daughter [phone]  04/27/14 Ellan Lambert, RN, BSN [phone] Pt for poss dc home tomorrow with daughters.  Will need HH follow up; will arrange HHRN and HHPT.  Referral to [facility], per daughter's choice.  Start of care 24-48h post dc date. 3 in 1 [facility] requested for home; referral to [facility] for DME needs.  Pt has RW and shower seat at home.  04/26/14 [place] MSN BSN CCM Pt lives alone but is doing well with mobility, reports he is normally very active.

## 2014-04-27 NOTE — Progress Notes (Addendum)
      [address]       [place] [address]             [phone]        3 Days Post-Op Procedure(s) (LRB): CORONARY ARTERY BYPASS GRAFTING (CABG) (N/A) INTRAOPERATIVE TRANSESOPHAGEAL ECHOCARDIOGRAM (N/A)  Subjective: Patient eating breakfast without complaints.  Objective: Vital signs in last 24 hours: Temp:  [97.7 F (36.5 C)-98.8 F (37.1 C)] 98.2 F (36.8 C) (06/04 0523) Pulse Rate:  [67-90] 78 (06/04 0523) Cardiac Rhythm:  [-] Normal sinus rhythm (06/03 2000) Resp:  [18-25] 18 (06/04 0523) BP: (94-146)/(49-83) 144/71 mmHg (06/04 0523) SpO2:  [93 %-99 %] 97 % (06/04 0523)  Pre op weight 71 kg Current Weight  04/26/14 161 lb 6 oz (73.2 kg)      Intake/Output from previous day: 06/03 0701 - 06/04 0700 In: 660 [P.O.:480; I.V.:180] Out: 665 [Urine:665]   Physical Exam:  Cardiovascular: RRR Pulmonary: Slightly diminished at bases; no rales, wheezes, or rhonchi. Abdomen: Soft, non tender, bowel sounds present. Extremities: Trace lower extremity edema. Wounds: Clean and dry.  No erythema or signs of infection.  Lab Results: CBC: Recent Labs  04/26/14 0430 04/27/14 0335  WBC 14.7* 12.7*  HGB 9.7* 9.5*  HCT 30.5* 30.3*  PLT 150 174   BMET:  Recent Labs  04/26/14 0430 04/27/14 0335  NA 137 137  K 4.3 4.2  CL 102 102  CO2 25 26  GLUCOSE 112* 128*  BUN 21 27*  CREATININE 0.96 1.03  CALCIUM 8.1* 8.1*    PT/INR:  Lab Results  Component Value Date   INR 1.54* 04/24/2014   INR 1.03 04/18/2014   ABG:  INR: Will add last result for INR, ABG once components are confirmed Will add last 4 CBG results once components are confirmed  Assessment/Plan:  1. CV - SR,PVCs in the 70's. On Lopressor 25 bid. Will start low dose Lisinopril for better BP control. Will discuss with Dr. Cyndia Bent if should change Lopressor to Coreg (LVEF prior to surgery 25%) 2.  Pulmonary - CXR appears to show trace right apical pneumothorax and bibasilar  atelectasis. Encourage incentive spirometer. 3. Volume Overload - On Lasix 40 daily 4.  Acute blood loss anemia - H and H stable at 9.5 and 30.3. Start folic acid and ferrous sulfate 5. CBGs 124/124/122. Pre op HGA1C is 6.3. Patient likely pre diabetic. Will stop accu checks and SS. Will need further surveillance as an outpatient.  6. Remove EPW in am 7. Stop stool softeners as had loose stools previously 8. Chest sutures to remain. Will remove at office 9. Possibly home in 1-2 days  Donielle M ZimmermanPA-C 04/27/2014,7:28 AM   Chart reviewed, patient examined, agree with above. He is doing well overall. CXR looks fine with resolution of ptx. He is a little confused at times. Narcotics stopped and will use tylenol for pain. He can go home tomorrow if no problems.

## 2014-04-28 DIAGNOSIS — I2510 Atherosclerotic heart disease of native coronary artery without angina pectoris: Secondary | ICD-10-CM | POA: Diagnosis present

## 2014-04-28 DIAGNOSIS — I472 Ventricular tachycardia: Secondary | ICD-10-CM | POA: Diagnosis present

## 2014-04-28 DIAGNOSIS — I4729 Other ventricular tachycardia: Secondary | ICD-10-CM

## 2014-04-28 MED ORDER — FOLIC ACID 1 MG PO TABS: 1.0000 mg | ORAL_TABLET | Freq: Every day | ORAL | Status: DC

## 2014-04-28 MED ORDER — ATORVASTATIN CALCIUM 80 MG PO TABS: 80.0000 mg | ORAL_TABLET | Freq: Every day | ORAL | Status: DC

## 2014-04-28 MED ORDER — CARVEDILOL 6.25 MG PO TABS: 6.2500 mg | ORAL_TABLET | Freq: Two times a day (BID) | ORAL | Status: DC

## 2014-04-28 MED ORDER — ACETAMINOPHEN 325 MG PO TABS: 650.0000 mg | ORAL_TABLET | ORAL | Status: DC | PRN

## 2014-04-28 MED ORDER — FERROUS SULFATE 325 (65 FE) MG PO TABS: 325.0000 mg | ORAL_TABLET | Freq: Every day | ORAL | Status: DC

## 2014-04-28 MED ORDER — ASPIRIN 325 MG PO TBEC: 325.0000 mg | DELAYED_RELEASE_TABLET | Freq: Every day | ORAL | Status: DC

## 2014-04-28 MED ORDER — CARVEDILOL 6.25 MG PO TABS
6.2500 mg | ORAL_TABLET | Freq: Two times a day (BID) | ORAL | Status: DC
  Filled 2014-04-28 (×3): qty 1

## 2014-04-28 MED ORDER — CARVEDILOL 6.25 MG PO TABS
6.2500 mg | ORAL_TABLET | Freq: Two times a day (BID) | ORAL | Status: DC
  Administered 2014-04-28 – 2014-04-30 (×4): 6.25 mg via ORAL
  Filled 2014-04-28 (×6): qty 1

## 2014-04-28 MED ORDER — CARVEDILOL 3.125 MG PO TABS: 3.1250 mg | ORAL_TABLET | Freq: Two times a day (BID) | ORAL | Status: DC

## 2014-04-28 MED ORDER — LISINOPRIL 2.5 MG PO TABS: 2.5000 mg | ORAL_TABLET | Freq: Every day | ORAL | Status: DC

## 2014-04-28 MED ORDER — CARVEDILOL 3.125 MG PO TABS
3.1250 mg | ORAL_TABLET | Freq: Two times a day (BID) | ORAL | Status: DC
  Administered 2014-04-28: 3.125 mg via ORAL
  Filled 2014-04-28 (×3): qty 1

## 2014-04-28 NOTE — Progress Notes (Addendum)
CARE MANAGEMENT NOTE 04/28/2014  Patient:  Bruce Green, Bruce Green   Account Number:  [id number]  Date Initiated:  04/24/2014  Documentation initiated by:  [facility]  Subjective/Objective Assessment:   Post op CABG  PTA, pt lives alone and is independent     Action/Plan:   Daughters to provide care at [employer]; they plan to also hire private duty sitter for 2 weeks post dc.   Anticipated DC Date:  04/28/2014   Anticipated DC Plan:  [place]  CM consult      [facility] Choice  HOME HEALTH   Choice offered to / List presented to:  C-1 Patient   DME arranged  [place]      DME agency  [place] arranged  HH-1 RN  [place]   Status of service:  Completed, signed off Medicare Important Message given?  YES (If response is "NO", the following Medicare IM given date fields will be blank) Date Medicare IM given:  04/23/2014 Date Additional Medicare IM given:  04/26/2014  Discharge Disposition:  [place]  Per UR Regulation:  Reviewed for med. necessity/level of care/duration of stay  If discussed at [place] of Stay Meetings, dates discussed:    Comments:  04/28/2014 11:24 AM  NCM spoke to pt and he is requesting RW for home. He does not want 3n1 at this time. HH arranged with [facility]. NCM requested [facility] orders with completed F2F. [facility] aware of pt scheduled dc home on 04/29/2014.  Additional Medicare IM given, placed on chart.  Jonnie Finner RN CCM Case Mgmt phone [phone]  Contact:  Proctor,[name] Daughter [phone]   Williard,Carolyn Daughter [phone]  04/27/14 Ellan Lambert, RN, BSN [phone] Pt for poss dc home tomorrow with daughters.  Will need HH follow up; will arrange HHRN and HHPT.  Referral to [facility], per daughter's choice.  Start of care 24-48h post dc date. 3 in 1 [facility] requested for home; referral to [facility] for DME needs.  Pt has RW and  shower seat at home.  04/26/14 [place] MSN BSN CCM Pt lives alone but is doing well with mobility, reports he is normally very active.

## 2014-04-28 NOTE — Progress Notes (Signed)
Patient had a nonsustained run of V-Tach this morning, 39 beats total.  Some leads appeared to show it as wide-complex V-Tach 150s.  Patient was asymptomatic and said he felt fine.  All vitals remained stable afterward and HR quickly returned to 70s-80s, NSR.  Strip is available to be viewed in Epic.  Will continue to monitor.  Randell Patient

## 2014-04-28 NOTE — Progress Notes (Signed)
Pt amb 727ft pushing walker accompanied by NT. NT reports steady gait and no complaints. Pt assisted to chair with call bell in reach. Will continue to monitor pt closely.

## 2014-04-28 NOTE — Progress Notes (Signed)
SATURATION QUALIFICATIONS: (This note is used to comply with regulatory documentation for home oxygen)  Patient Saturations on Room Air at Rest = 94%  Patient Saturations on Room Air while Ambulating = 86%  Patient Saturations on 2 Liters of oxygen while Ambulating = 90-93%  Please briefly explain why patient needs home oxygen:Desats with activity on RA.  Thanks. [facility] [phone] (pager)

## 2014-04-28 NOTE — Progress Notes (Addendum)
      [address]       [place] [address]             [phone]        4 Days Post-Op Procedure(s) (LRB): CORONARY ARTERY BYPASS GRAFTING (CABG) (N/A) INTRAOPERATIVE TRANSESOPHAGEAL ECHOCARDIOGRAM (N/A)  Subjective: Patient with occasional cough (mucous like).  Objective: Vital signs in last 24 hours: Temp:  [98.2 F (36.8 C)-99 F (37.2 C)] 98.2 F (36.8 C) (06/05 0651) Pulse Rate:  [77-79] 79 (06/05 0651) Cardiac Rhythm:  [-] Normal sinus rhythm (06/04 2100) Resp:  [18-20] 18 (06/05 0651) BP: (134-150)/(52-77) 134/52 mmHg (06/05 0651) SpO2:  [94 %] 94 % (06/05 0651) Weight:  [157 lb 1.6 oz (71.26 kg)-160 lb 15 oz (73 kg)] 157 lb 1.6 oz (71.26 kg) (06/05 0651)  Pre op weight 71 kg Current Weight  04/28/14 157 lb 1.6 oz (71.26 kg)      Intake/Output from previous day: 06/04 0701 - 06/05 0700 In: 120 [P.O.:120] Out: 575 [Urine:575]   Physical Exam:  Cardiovascular: RRR Pulmonary: Slightly diminished at bases; no rales, wheezes, or rhonchi. Abdomen: Soft, non tender, bowel sounds present. Extremities: Trace lower extremity edema. Wounds: Clean and dry.  No erythema or signs of infection.  Lab Results: CBC:  Recent Labs  04/26/14 0430 04/27/14 0335  WBC 14.7* 12.7*  HGB 9.7* 9.5*  HCT 30.5* 30.3*  PLT 150 174   BMET:   Recent Labs  04/26/14 0430 04/27/14 0335  NA 137 137  K 4.3 4.2  CL 102 102  CO2 25 26  GLUCOSE 112* 128*  BUN 21 27*  CREATININE 0.96 1.03  CALCIUM 8.1* 8.1*    PT/INR:  Lab Results  Component Value Date   INR 1.54* 04/24/2014   INR 1.03 04/18/2014   ABG:  INR: Will add last result for INR, ABG once components are confirmed Will add last 4 CBG results once components are confirmed  Assessment/Plan:  1. CV - Previous run of ?NSVT (more likely aberrant atrial tachycardia) earlier this am.ST,PVCs in the 110's this am and then at time of exam, SR with PVC/PACs in the 70's. On Lopressor 25 bid. Will  change Lopressor to Coreg 3.125 bid. Continue low dose Lisinopril. 2.  Pulmonary - Encourage incentive spirometer. 3. Volume Overload - On Lasix 40 daily. At pre op weight and no LE edema so will not need at discharger. 4.  Acute blood loss anemia - H and H stable at 9.5 and 30.3. Continue folic acid and ferrous sulfate 6. Remove EPW  7. Chest sutures to remain. Will remove at office 8. Will monitor on tele for today and likely discharge in am  Donielle M ZimmermanPA-C 04/28/2014,7:33 AM    Chart reviewed, patient examined, agree with above. He is doing well overall. Still having PAC's and PVC's and possibly brief run of NSVT or atrial tachy with aberrancy. He has not noticed any of it. Will increase Coreg to 6.25 bid since his blood pressure has been good. Watch rhythm today and plan to send home tomorrow.

## 2014-04-28 NOTE — Progress Notes (Signed)
Physical Therapy Treatment Patient Details Name: Bruce Green MRN: [id number] DOB: 03-05-1919 Today's Date: 04/28/2014    History of Present Illness Pt s/p CABG x 3. No significant medical history.    PT Comments    Pt admitted with above. Pt currently with functional limitations due to balance and endurance deficits.  Pt will benefit from skilled PT to increase their independence and safety with mobility to allow discharge to the venue listed below.    Follow Up Recommendations  Home health PT;Supervision - Intermittent     Equipment Recommendations  Rolling walker with 5" wheels;3in1 (PT);Other (comment) (home O2)    Recommendations for Other Services       Precautions / Restrictions Precautions Precautions: Sternal Restrictions Weight Bearing Restrictions: No    Mobility  Bed Mobility Overal bed mobility: Needs Assistance Bed Mobility: Rolling;Sidelying to Sit Rolling: Mod assist Sidelying to sit: Mod assist       General bed mobility comments: Pt needed cues for log roll and using pillow to maintain sternal precautions.  Pt needed quite a bit of assist as he was having difficulty following commands.    Transfers Overall transfer level: Needs assistance Equipment used: None Transfers: Sit to/from Stand Sit to Stand: Min assist         General transfer comment: verbal cues to use hands on knees to come to stand. Also discussed holding pillow to maintain precautions.   Ambulation/Gait Ambulation/Gait assistance: Min assist;Mod assist Ambulation Distance (Feet): 650 Feet Assistive device: 1 person hand held assist Gait Pattern/deviations: Step-through pattern;Decreased step length - right;Decreased step length - left;Staggering left;Staggering right;Narrow base of support   Gait velocity interpretation: Below normal speed for age/gender General Gait Details: Pt staggers bil directions.  Family present and states this is very different than PTA.  Family aware  taht he needs to use RW at all times and have 24 hour care.     Stairs            Wheelchair Mobility    Modified Rankin (Stroke Patients Only)       Balance Overall balance assessment: Needs assistance;History of Falls         Standing balance support: No upper extremity supported;During functional activity Standing balance-Leahy Scale: Fair                      Cognition Arousal/Alertness: Awake/alert Behavior During Therapy: WFL for tasks assessed/performed Overall Cognitive Status: Impaired/Different from baseline Area of Impairment: Safety/judgement;Awareness;Problem solving         Safety/Judgement: Decreased awareness of safety;Decreased awareness of deficits   Problem Solving: Difficulty sequencing;Requires verbal cues;Slow processing General Comments: Pt has been intermittently confused.    Exercises      General Comments General comments (skin integrity, edema, etc.): Spent awhile going over sternal precaution handout with pt and family.  Also discussed sternal precautins and bed mobility and sit to stand.        Pertinent Vitals/Pain Desat to 86% on RA.  Other VSS, no pain    Home Living                      Prior Function            PT Goals (current goals can now be found in the care plan section) Progress towards PT goals: Progressing toward goals    Frequency  Min 3X/week    PT Plan Current plan remains appropriate    Co-evaluation  End of Session Equipment Utilized During Treatment: Gait belt;Oxygen Activity Tolerance: Patient limited by fatigue Patient left: in chair;with call bell/phone within reach;with family/visitor present     Time: 1010-1042 PT Time Calculation (min): 32 min  Charges:  $Gait Training: 8-22 mins $Self Care/Home Management: 8-22                    G Codes:      Bruce Green 05-22-14, 12:18 PM [facility] [phone]  (pager)

## 2014-04-28 NOTE — Progress Notes (Signed)
EPW removed per order. Ends intact. VSS. Pt instructed of one hour bedrest. Verbalized understanding. Will continue to monitor pt cloesly. 

## 2014-04-28 NOTE — Progress Notes (Signed)
Pt ambulated approximately 300 ft accompanied by RN using a front wheel walker. Pt tolerated ambulation well. Pt is currently resting in bed with call bell within reach. Will continue to monitor.  Coolidge Breeze

## 2014-04-28 NOTE — Progress Notes (Signed)
     Cards: Dr. Sallyanne Kuster  Subjective:  Feels better today, jovial. NSVT 9 beats, PVC  Objective:  Vital Signs in the last 24 hours: Temp:  [98.2 F (36.8 C)-99 F (37.2 C)] 98.2 F (36.8 C) (06/05 0651) Pulse Rate:  [77-79] 79 (06/05 0651) Resp:  [18-20] 18 (06/05 0651) BP: (134-150)/(52-77) 134/52 mmHg (06/05 0651) SpO2:  [94 %] 94 % (06/05 0651) Weight:  [157 lb 1.6 oz (71.26 kg)] 157 lb 1.6 oz (71.26 kg) (06/05 0651)  Intake/Output from previous day: 06/04 0701 - 06/05 0700 In: 120 [P.O.:120] Out: 575 [Urine:575]   Physical Exam: General: Well developed, well nourished, in no acute distress. Looks younger than stated age.  Head:  Normocephalic and atraumatic. Lungs: Clear currently.  Heart: Normal S1 and S2. Occas ectopy No murmur, rubs or gallops.  Abdomen: soft, non-tender, positive bowel sounds. Extremities: No clubbing or cyanosis. No edema. Neurologic: Alert and oriented x 3.    Lab Results:  Recent Labs  04/26/14 0430 04/27/14 0335  WBC 14.7* 12.7*  HGB 9.7* 9.5*  PLT 150 174    Recent Labs  04/26/14 0430 04/27/14 0335  NA 137 137  K 4.3 4.2  CL 102 102  CO2 25 26  GLUCOSE 112* 128*  BUN 21 27*  CREATININE 0.96 1.03     Telemetry: NSR, PAC Personally viewed.   Assessment/Plan:  Active Problems:   Unstable angina   S/P CABG x 3   Cardiomyopathy, ischemic  -Ischemic cardiomyopathy EF 25%- hopeful improvement with revasc.  -increase coreg to 6.25 BID (continue to uptitrate as outpt.) -NSVT - Bb. Revasc. Expected with EF.  -added ACE-I, lisinopril  -Great attitude.   -Have follow up with Dr. Renae Fickle [facility] 04/28/2014, 8:35 AM

## 2014-04-28 NOTE — Progress Notes (Signed)
[id number] Cardiac Rehab Completed discharge education with pt. He voices understanding. Pt agrees to [facility]. CRP in [place], will send referral. Deon Pilling, RN 04/28/2014 12:24 PM

## 2014-04-29 MED ORDER — GUAIFENESIN ER 600 MG PO TB12
600.0000 mg | ORAL_TABLET | Freq: Two times a day (BID) | ORAL | Status: DC
  Administered 2014-04-29 – 2014-04-30 (×2): 600 mg via ORAL
  Filled 2014-04-29 (×4): qty 1

## 2014-04-29 NOTE — Progress Notes (Signed)
Reviewed ed with pt and daughter. Voiced good understanding. Gave HF booklet and discussed daily wts and low sodium. Assisted pt and bed and instructed on how to do it himself. Ready for d/c. [id number] Yves Dill CES, ACSM 10:12 AM 04/29/2014

## 2014-04-29 NOTE — Progress Notes (Addendum)
       [address]       [place] [address]             [phone]          5 Days Post-Op Procedure(s) (LRB): CORONARY ARTERY BYPASS GRAFTING (CABG) (N/A) INTRAOPERATIVE TRANSESOPHAGEAL ECHOCARDIOGRAM (N/A)  Subjective: Feels well, breathing stable.   Objective: Vital signs in last 24 hours: Patient Vitals for the past 24 hrs:  BP Temp Temp src Pulse Resp SpO2 Weight  04/29/14 0500 - - - - - - 157 lb 6.4 oz (71.396 kg)  04/29/14 0339 113/63 mmHg 99 F (37.2 C) Oral 73 18 95 % -  04/28/14 2153 111/59 mmHg 99.1 F (37.3 C) Oral 76 18 96 % -  04/28/14 1747 122/58 mmHg - - 82 - - -  04/28/14 1337 123/52 mmHg 98 F (36.7 C) Oral 74 20 96 % -  04/28/14 1000 117/46 mmHg - - 77 - - -  04/28/14 0945 112/49 mmHg - - 75 - - -  04/28/14 0930 123/47 mmHg - - 77 - - -  04/28/14 0915 116/86 mmHg - - 84 - - -   Current Weight  04/29/14 157 lb 6.4 oz (71.396 kg)  Pre op weight 71 kg    Intake/Output from previous day: 06/05 0701 - 06/06 0700 In: 360 [P.O.:360] Out: 600 [Urine:600]    PHYSICAL EXAM:  Heart: RRR Lungs: Few coarse BS that clear with cough Wound: Clean and dry Extremities: No LE edema    Lab Results: CBC: Recent Labs  04/27/14 0335  WBC 12.7*  HGB 9.5*  HCT 30.3*  PLT 174   BMET:  Recent Labs  04/27/14 0335  NA 137  K 4.2  CL 102  CO2 26  GLUCOSE 128*  BUN 27*  CREATININE 1.03  CALCIUM 8.1*    PT/INR: No results found for this basename: LABPROT, INR,  in the last 72 hours    Assessment/Plan: S/P Procedure(s) (LRB): CORONARY ARTERY BYPASS GRAFTING (CABG) (N/A) INTRAOPERATIVE TRANSESOPHAGEAL ECHOCARDIOGRAM (N/A)  CV- stable, no further SVT.  Continue Coreg, ACE-I.  Doing well otherwise.  Plan d/c home today- instructions reviewed with patient and daughter.   LOS: 8 days    Coolidge Breeze 04/29/2014  ADDENDUM:  Pt had NSVT this am prior to administration of meds this am, but has otherwise been stable.   Discussed with MD- he only received 1 dose of Coreg at the increased dose last night.  Will give am meds and watch HR.  Plan d/c home on Coreg 6.25 if he otherwise remains stable.  Having runs of afib Cont to adjust meds and check labs in am Observe on telemetry  patient examined and medical record reviewed,agree with above note. Tharon Aquas Trigt 04/29/2014

## 2014-04-29 NOTE — Progress Notes (Signed)
Pt ambulated approximately 200 ft using front wheel walker accompanied by RN. Pt stopped X's 2 to catch his breath, otherwise tolerated ambulation well. Pt is currently resting in chair with call bell within reach. Will continue to monitor.   Coolidge Breeze

## 2014-04-29 NOTE — Progress Notes (Signed)
Pt ambulated in hallway 350 ft with rolling walker and tolerated activity good. Pt took 2 standing rests. Pt assisted to bed with call bell within reach. Will continue to monitor.

## 2014-04-30 LAB — BASIC METABOLIC PANEL
BUN: 37 mg/dL — ABNORMAL HIGH (ref 6–23)
CO2: 28 mEq/L (ref 19–32)
Calcium: 8.7 mg/dL (ref 8.4–10.5)
Chloride: 104 mEq/L (ref 96–112)
Creatinine, Ser: 0.99 mg/dL (ref 0.50–1.35)
GFR calc Af Amer: 80 mL/min — ABNORMAL LOW (ref >90)
GFR calc non Af Amer: 69 mL/min — ABNORMAL LOW (ref >90)
Glucose, Bld: 125 mg/dL — ABNORMAL HIGH (ref 70–99)
Potassium: 4.1 mEq/L (ref 3.7–5.3)
Sodium: 142 mEq/L (ref 137–147)

## 2014-04-30 LAB — CBC
HCT: 30.6 % — ABNORMAL LOW (ref 39.0–52.0)
Hemoglobin: 9.3 g/dL — ABNORMAL LOW (ref 13.0–17.0)
MCH: 24.2 pg — ABNORMAL LOW (ref 26.0–34.0)
MCHC: 30.4 g/dL (ref 30.0–36.0)
MCV: 79.5 fL (ref 78.0–100.0)
Platelets: 250 10*3/uL (ref 150–400)
RBC: 3.85 MIL/uL — ABNORMAL LOW (ref 4.22–5.81)
RDW: 16.9 % — ABNORMAL HIGH (ref 11.5–15.5)
WBC: 9.8 10*3/uL (ref 4.0–10.5)

## 2014-04-30 MED ORDER — DIGOXIN 125 MCG PO TABS
0.1250 mg | ORAL_TABLET | ORAL | Status: DC
  Filled 2014-04-30: qty 1

## 2014-04-30 MED ORDER — DIGOXIN 125 MCG PO TABS: 0.1250 mg | ORAL_TABLET | ORAL | Status: DC

## 2014-04-30 NOTE — Progress Notes (Signed)
Pt discharged per MD order and protocol. Discharge instructions reviewed with patient and daughter, all questions answered. Pt aware of all follow up appointments and given all prescriptions.

## 2014-04-30 NOTE — Progress Notes (Signed)
Pt ambulated in hallway 300 ft with rolling walker on room air. Pt slightly unsteady at times. Pt's oxygen sats checked post walk and sats were 97-98%. Pt resting in chair with call bell within reach. Will continue to monitor.

## 2014-04-30 NOTE — Progress Notes (Signed)
       [address]       [place] [address]             [phone]          6 Days Post-Op Procedure(s) (LRB): CORONARY ARTERY BYPASS GRAFTING (CABG) (N/A) INTRAOPERATIVE TRANSESOPHAGEAL ECHOCARDIOGRAM (N/A)  Subjective: Just back from walking.  Felt a little weaker today, but still walked a good distance.  Continues to have intermittent runs of nonsustained SVT and AF.   Objective: Vital signs in last 24 hours: Patient Vitals for the past 24 hrs:  BP Temp Temp src Pulse Resp SpO2 Weight  04/30/14 0500 124/52 mmHg 97.3 F (36.3 C) Oral 75 18 95 % 152 lb 1.9 oz (69 kg)  04/29/14 2149 113/47 mmHg 98.9 F (37.2 C) Oral 80 18 95 % -  04/29/14 1713 154/79 mmHg - - 91 18 98 % -  04/29/14 1448 141/67 mmHg 98 F (36.7 C) Oral 103 17 96 % -   Current Weight  04/30/14 152 lb 1.9 oz (69 kg)  Pre op weight 71 kg    Intake/Output from previous day: 06/06 0701 - 06/07 0700 In: 720 [P.O.:720] Out: 200 [Urine:200]    PHYSICAL EXAM:  Heart: RRR, occ PVCs Lungs: Decreased BS in bases Wound: Clean and dry Extremities: No significant LE edema    Lab Results: CBC: Recent Labs  04/30/14 0338  WBC 9.8  HGB 9.3*  HCT 30.6*  PLT 250   BMET:  Recent Labs  04/30/14 0338  NA 142  K 4.1  CL 104  CO2 28  GLUCOSE 125*  BUN 37*  CREATININE 0.99  CALCIUM 8.7    PT/INR: No results found for this basename: LABPROT, INR,  in the last 72 hours    Assessment/Plan: S/P Procedure(s) (LRB): CORONARY ARTERY BYPASS GRAFTING (CABG) (N/A) INTRAOPERATIVE TRANSESOPHAGEAL ECHOCARDIOGRAM (N/A) CV- review of telemetry shows freq PVCs, and intermittent nonsustained AF and SVT.  Coreg increased yesterday. Discussed with MD. He is not a good candidate for Amiodarone due to his age, and EF is low around 20-25%.  Will start Digoxin 0.125 mg every other day. He is otherwise doing well and we plan to discharge home later today if he remains stable.   LOS: 9 days     Coolidge Breeze 04/30/2014

## 2014-05-01 ENCOUNTER — Telehealth: Payer: Self-pay | Admitting: *Deleted

## 2014-05-01 NOTE — Telephone Encounter (Signed)
Bruce Green was just discharged yesterday s/p CABG and is congested.  She would like to give him Mucinex if that would be aprorpriate. I said it would be.

## 2014-05-02 ENCOUNTER — Other Ambulatory Visit: Payer: Self-pay | Admitting: Internal Medicine

## 2014-05-04 ENCOUNTER — Encounter (INDEPENDENT_AMBULATORY_CARE_PROVIDER_SITE_OTHER): Payer: Self-pay

## 2014-05-04 DIAGNOSIS — I2510 Atherosclerotic heart disease of native coronary artery without angina pectoris: Secondary | ICD-10-CM

## 2014-05-05 ENCOUNTER — Telehealth: Payer: Self-pay | Admitting: *Deleted

## 2014-05-05 NOTE — Telephone Encounter (Signed)
Signed order for Phase II cardiac rehab faxed to [facility].

## 2014-05-11 LAB — PULMONARY FUNCTION TEST
FEF 25-75 Pre: 0.54 L/sec
FEF2575-%PRED-PRE: 40 %
FEV1-%Pred-Pre: 57 %
FEV1-Pre: 1.33 L
FEV1FVC-%PRED-PRE: 84 %
FEV6-%PRED-PRE: 70 %
FEV6-PRE: 2.23 L
FEV6FVC-%Pred-Pre: 106 %
FVC-%PRED-PRE: 66 %
FVC-PRE: 2.29 L
Pre FEV1/FVC ratio: 58 %
Pre FEV6/FVC Ratio: 98 %

## 2014-05-12 ENCOUNTER — Telehealth: Payer: Self-pay | Admitting: *Deleted

## 2014-05-12 NOTE — Telephone Encounter (Signed)
His daughter called to relate one instance of very restless sleep last night and waking up being very shortt of breath.  He is ok now.  She was concerned since he has been fine since his CABG three  Weeks ago and is scheduled to be seen next week.  I reassured her but advised her that if it happened again she may want to take him to the ER for an evaluaion since it will be the weekend.  She agreed.

## 2014-05-17 ENCOUNTER — Encounter: Payer: Self-pay | Admitting: Cardiology

## 2014-05-17 ENCOUNTER — Telehealth: Payer: Self-pay | Admitting: *Deleted

## 2014-05-17 ENCOUNTER — Ambulatory Visit (INDEPENDENT_AMBULATORY_CARE_PROVIDER_SITE_OTHER): Payer: Medicare Other | Admitting: Cardiology

## 2014-05-17 VITALS — BP 134/68 | HR 79 | Ht 70.0 in | Wt 155.0 lb

## 2014-05-17 DIAGNOSIS — Z951 Presence of aortocoronary bypass graft: Secondary | ICD-10-CM

## 2014-05-17 DIAGNOSIS — R630 Anorexia: Secondary | ICD-10-CM

## 2014-05-17 DIAGNOSIS — H44009 Unspecified purulent endophthalmitis, unspecified eye: Secondary | ICD-10-CM

## 2014-05-17 DIAGNOSIS — D500 Iron deficiency anemia secondary to blood loss (chronic): Secondary | ICD-10-CM

## 2014-05-17 DIAGNOSIS — I2589 Other forms of chronic ischemic heart disease: Secondary | ICD-10-CM

## 2014-05-17 DIAGNOSIS — R0602 Shortness of breath: Secondary | ICD-10-CM

## 2014-05-17 DIAGNOSIS — H44001 Unspecified purulent endophthalmitis, right eye: Secondary | ICD-10-CM

## 2014-05-17 DIAGNOSIS — I2510 Atherosclerotic heart disease of native coronary artery without angina pectoris: Secondary | ICD-10-CM

## 2014-05-17 DIAGNOSIS — I255 Ischemic cardiomyopathy: Secondary | ICD-10-CM

## 2014-05-17 LAB — CBC
HCT: 38.6 % — ABNORMAL LOW (ref 39.0–52.0)
Hemoglobin: 11.9 g/dL — ABNORMAL LOW (ref 13.0–17.0)
MCH: 24.3 pg — AB (ref 26.0–34.0)
MCHC: 30.8 g/dL (ref 30.0–36.0)
MCV: 78.9 fL (ref 78.0–100.0)
PLATELETS: 368 10*3/uL (ref 150–400)
RBC: 4.89 MIL/uL (ref 4.22–5.81)
RDW: 16.8 % — AB (ref 11.5–15.5)
WBC: 8.9 10*3/uL (ref 4.0–10.5)

## 2014-05-17 MED ORDER — POTASSIUM CHLORIDE CRYS ER 20 MEQ PO TBCR: 20.0000 meq | EXTENDED_RELEASE_TABLET | Freq: Every day | ORAL | Status: DC

## 2014-05-17 MED ORDER — FUROSEMIDE 20 MG PO TABS: 40.0000 mg | ORAL_TABLET | Freq: Every day | ORAL | Status: DC

## 2014-05-17 MED ORDER — TOBRAMYCIN-DEXAMETHASONE 0.3-0.1 % OP SUSP: 1.0000 [drp] | Freq: Two times a day (BID) | OPHTHALMIC | Status: DC

## 2014-05-17 MED ORDER — TOBRAMYCIN-DEXAMETHASONE 0.3-0.05 % OP SUSP: 1.0000 [drp] | Freq: Four times a day (QID) | OPHTHALMIC | Status: DC

## 2014-05-17 NOTE — Telephone Encounter (Signed)
Ok use generic twice a day to rt eye only  For 5 days.

## 2014-05-17 NOTE — Telephone Encounter (Signed)
Dose changed to generic dose with BID freq per Mickel Baas, NP. Pharmacy notified of med update

## 2014-05-17 NOTE — Patient Instructions (Addendum)
Take lasix 2 tabs for 2 days then 1 daily, if you become weak and tired hold the lasix and call us.  Take potassium with to lasix 20 meq daily, if you hold the lasix then hold the potassium.  If you are still restless at night, call and we will try Xanax to help you sleep.  Have lab work and CXR done today.  Follow up with Dr. Sallyanne Kuster in 4-5 weeks.

## 2014-05-17 NOTE — Progress Notes (Signed)
05/21/2014   PCP:  Melinda Crutch, MD   Chief Complaint  Patient presents with  . Follow-up    S/P hospital visit      needs letter for home care 24 hr care    Primary Cardiologist: Dr. Bertrum Sol   HPI:  80 year-old white male presents for followup status post bypass grafting.  He was admitted to the hospital 04/21/2014 for exertional fatigue and shortness of breath cardiac cath revealed significant coronary disease including 50-60% ostial left main 99% LAD 70% OM1 and 99% OM 200% occlusion of the RCA his EF was found to be 25-30%. Tender bypass grafting 04/24/2014 with LIMA to the LAD vein graft to OM 2 and vein graft to distal RCA. This was done by Dr. Cyndia Bent.  He has done fairly well, though he is somewhat frustrated that he cannot ambulate as well as he thought he should.  He has also been having episodes of shortness of breath.  He has a productive cough with white mucus; has not had fevers and is just anxious and not been able to rest very well at night as well. Uses a walker to ambulate. He also complains of irritated right eye with yellow drainage and irritation of the sclera.    He has surgical chest discomfort it is not severe. Shortness of breath is the most concerning for him.    Allergies  Allergen Reactions  . Sulfa Antibiotics Rash    Current Outpatient Prescriptions  Medication Sig Dispense Refill  . B Complex-C-Calcium (B-COMPLEX/VITAMIN C PO) Take 1 tablet by mouth every morning.       Marland Kitchen dextromethorphan-guaiFENesin (MUCINEX DM) 30-600 MG per 12 hr tablet Take 1 tablet by mouth 2 (two) times daily as needed for cough.      . docusate sodium (COLACE) 100 MG capsule Take 100 mg by mouth 2 (two) times daily.      . ferrous sulfate 325 (65 FE) MG tablet Take 1 tablet (325 mg total) by mouth daily with breakfast. For one month then stop.    3  . folic acid (FOLVITE) 1 MG tablet Take 1 tablet (1 mg total) by mouth daily. For one month then stop.  30 tablet   0  . polycarbophil (FIBERCON) 625 MG tablet Take 625 mg by mouth every morning.       . polyethylene glycol (MIRALAX / GLYCOLAX) packet Take 17 g by mouth every morning.       Marland Kitchen acetaminophen (TYLENOL) 325 MG tablet Take 650 mg by mouth every 4 (four) hours as needed for mild pain or moderate pain.      Marland Kitchen aspirin EC 325 MG tablet Take 325 mg by mouth every morning.      Marland Kitchen atorvastatin (LIPITOR) 80 MG tablet Take 80 mg by mouth every evening.      . calcium carbonate (OS-CAL) 600 MG TABS tablet Take 600 mg by mouth every morning.      . carvedilol (COREG) 6.25 MG tablet Take 6.25 mg by mouth 2 (two) times daily with a meal.      . digoxin (LANOXIN) 0.125 MG tablet Take 0.125 mg by mouth every other day.      . furosemide (LASIX) 20 MG tablet Take 20 mg by mouth every morning.      Marland Kitchen lisinopril (PRINIVIL,ZESTRIL) 2.5 MG tablet Take 2.5 mg by mouth every morning.      . pantoprazole (PROTONIX) 40 MG tablet Take  40 mg by mouth every morning.      . potassium chloride SA (K-DUR,KLOR-CON) 20 MEQ tablet Take 20 mEq by mouth every morning.      . tobramycin-dexamethasone (TOBRADEX) ophthalmic solution Place 1 drop into the right eye 2 (two) times daily.       No current facility-administered medications for this visit.    Past Medical History  Diagnosis Date  . Stricture esophagus     distal  . GERD (gastroesophageal reflux disease)   . Hiatal hernia     6cm  . Coronary artery disease 04/21/2014      severe triple vessel  . Ischemic cardiomyopathy 03/2014    EF 25-30%    Past Surgical History  Procedure Laterality Date  . Cholecystectomy    . Shoulder surgery    . Appendectomy    . Esophagogastroduodenoscopy  04/09/2012    Procedure: ESOPHAGOGASTRODUODENOSCOPY (EGD);  Surgeon: Gatha Mayer, MD;  Location: [facility];  Service: Endoscopy;  Laterality: N/A;  . Coronary artery bypass graft N/A 04/24/2014    Procedure: CORONARY ARTERY BYPASS GRAFTING (CABG);  Surgeon: Gaye Pollack, MD;   Location: [facility];  Service: Open Heart Surgery;  Laterality: N/A;  Times 3 using left internal mammary artery and endoscopically harvested right saphenous vein  . Intraoperative transesophageal echocardiogram N/A 04/24/2014    Procedure: INTRAOPERATIVE TRANSESOPHAGEAL ECHOCARDIOGRAM;  Surgeon: Gaye Pollack, MD;  Location: [facility] OR;  Service: Open Heart Surgery;  Laterality: N/A;    TDD:UKGURKY:HC colds or fevers,  weight is down 2 pounds from the hospital Skin:no rashes or ulcers HEENT:no blurred vision, no congestion CV:see HPI PUL:see HPI GI:no diarrhea ++ constipation or melena, no indigestion GU:no hematuria, no dysuria MS:no joint pain, no claudication Neuro:no syncope, no lightheadedness Endo:no diabetes, no thyroid disease  Wt Readings from Last 3 Encounters:  05/17/14 155 lb (70.308 kg)  04/30/14 152 lb 1.9 oz (69 kg)  04/30/14 152 lb 1.9 oz (69 kg)    PHYSICAL EXAM BP 134/68  Pulse 79  Ht 5\' 10"  (1.778 m)  Wt 155 lb (70.308 kg)  BMI 22.24 kg/m2 General:Pleasant affect, NAD, anxious on rate of improvement Skin:Warm and dry, brisk capillary refill HEENT:normocephalic, sclera clear, mucus membranes moist Neck:supple, mild JVD, no bruits  Heart:S1S2 RRR without murmur, gallup, rub or click, incisions stable Lungs:clear without rales, rhonchi, or wheezes WCB:JSEG, non tender, + BS, do not palpate liver spleen or masses Ext:no lower ext edema, 2+ pedal pulses, 2+ radial pulses Neuro:alert and oriented X 3, MAE, follows commands, + facial symmetry EKG:SR no acute changes.  ASSESSMENT AND PLAN SOB (shortness of breath) Patient with increasing episodes of shortness of breath with rales today on exam. We'll check chest x-ray proBNP and add Lasix to his medical regimen blood pressure currently is stable but with his age we'll need to go fairly slow steady does not get lightheaded or dizzy. Also adjustments with his potassium were made  S/P CABG x 3 Bypass grafting on 04/24/2014  incision sites are stable no drainage no erythema.  EKG is stable  Cardiomyopathy, ischemic EF 25-30% he has some mild acute systolic heart failure today have resumed his Lasix and potassium.  Eye infection Right eye has been irritated the sclera is injected yellow crusty drainage Tobramax with steroid opthalmic drops were ordered. To use for 5 days.   He will take 20 mg of Lasix daily except for Thursday and Friday he'll take 40 mg dose.  he'll take 20 meq of potassium  daily. His daughter and I discussed sleep agents on it he cannot rest once his breathing has improved Xanax may be treatment to use. Would not using Ambien in 80 year old.   He is to have lab work done today basic metabolic panel CBC and PA and lateral chest x-ray will follow with Dr. Sallyanne Kuster in 4-5 weeks

## 2014-05-17 NOTE — Telephone Encounter (Signed)
RN spoke with Bruce Green. She states the new Rx for eyedrops has a concentration or dose that is the "brand" name of that eyedrop which they do not carry. The generic dose is 0.3-0.1 (which is twice the amount of dexamethasone) and is cheaper and in stock.   RN informed Bruce Green that Mickel Baas will have to be notified in order to authorize dose change.

## 2014-05-18 ENCOUNTER — Ambulatory Visit
Admission: RE | Admit: 2014-05-18 | Discharge: 2014-05-18 | Disposition: A | Discharge status: Home / Self Care | Payer: Medicare Other | Source: Ambulatory Visit | Attending: Cardiology | Admitting: Cardiology

## 2014-05-18 DIAGNOSIS — R0602 Shortness of breath: Secondary | ICD-10-CM

## 2014-05-18 DIAGNOSIS — D500 Iron deficiency anemia secondary to blood loss (chronic): Secondary | ICD-10-CM

## 2014-05-18 DIAGNOSIS — I2510 Atherosclerotic heart disease of native coronary artery without angina pectoris: Secondary | ICD-10-CM

## 2014-05-18 DIAGNOSIS — Z951 Presence of aortocoronary bypass graft: Secondary | ICD-10-CM

## 2014-05-18 DIAGNOSIS — R630 Anorexia: Secondary | ICD-10-CM

## 2014-05-18 LAB — BASIC METABOLIC PANEL WITH GFR
BUN: 20 mg/dL (ref 6–23)
CALCIUM: 9.1 mg/dL (ref 8.4–10.5)
CO2: 26 mEq/L (ref 19–32)
Chloride: 103 mEq/L (ref 96–112)
Creat: 0.95 mg/dL (ref 0.50–1.35)
GFR, EST AFRICAN AMERICAN: 80 mL/min
GFR, EST NON AFRICAN AMERICAN: 69 mL/min
Glucose, Bld: 114 mg/dL — ABNORMAL HIGH (ref 70–99)
POTASSIUM: 4.7 meq/L (ref 3.5–5.3)
SODIUM: 140 meq/L (ref 135–145)

## 2014-05-18 LAB — DIGOXIN LEVEL: Digoxin Level: 1.3 ng/mL (ref 0.8–2.0)

## 2014-05-18 LAB — PRO B NATRIURETIC PEPTIDE: PRO B NATRI PEPTIDE: 3629 pg/mL — AB (ref <451)

## 2014-05-21 ENCOUNTER — Emergency Department (HOSPITAL_COMMUNITY): Payer: Medicare Other

## 2014-05-21 ENCOUNTER — Encounter: Payer: Self-pay | Admitting: Cardiology

## 2014-05-21 ENCOUNTER — Encounter (HOSPITAL_COMMUNITY): Payer: Self-pay | Admitting: Radiology

## 2014-05-21 ENCOUNTER — Emergency Department (HOSPITAL_COMMUNITY)
Admission: EM | Admit: 2014-05-21 | Discharge: 2014-05-21 | Disposition: A | Discharge status: Home / Self Care | Payer: Medicare Other | Attending: Emergency Medicine | Admitting: Emergency Medicine

## 2014-05-21 ENCOUNTER — Encounter (HOSPITAL_COMMUNITY): Payer: Self-pay | Admitting: Emergency Medicine

## 2014-05-21 DIAGNOSIS — Z7982 Long term (current) use of aspirin: Secondary | ICD-10-CM | POA: Insufficient documentation

## 2014-05-21 DIAGNOSIS — J90 Pleural effusion, not elsewhere classified: Secondary | ICD-10-CM | POA: Insufficient documentation

## 2014-05-21 DIAGNOSIS — Z951 Presence of aortocoronary bypass graft: Secondary | ICD-10-CM | POA: Insufficient documentation

## 2014-05-21 DIAGNOSIS — Z79899 Other long term (current) drug therapy: Secondary | ICD-10-CM | POA: Insufficient documentation

## 2014-05-21 DIAGNOSIS — K219 Gastro-esophageal reflux disease without esophagitis: Secondary | ICD-10-CM | POA: Insufficient documentation

## 2014-05-21 DIAGNOSIS — I2510 Atherosclerotic heart disease of native coronary artery without angina pectoris: Secondary | ICD-10-CM | POA: Insufficient documentation

## 2014-05-21 DIAGNOSIS — Z792 Long term (current) use of antibiotics: Secondary | ICD-10-CM | POA: Insufficient documentation

## 2014-05-21 LAB — CBC WITH DIFFERENTIAL/PLATELET
Basophils Absolute: 0 10*3/uL (ref 0.0–0.1)
Basophils Relative: 0 % (ref 0–1)
Eosinophils Absolute: 0.3 10*3/uL (ref 0.0–0.7)
Eosinophils Relative: 4 % (ref 0–5)
HCT: 40.1 % (ref 39.0–52.0)
Hemoglobin: 12.3 g/dL — ABNORMAL LOW (ref 13.0–17.0)
LYMPHS PCT: 14 % (ref 12–46)
Lymphs Abs: 1.2 10*3/uL (ref 0.7–4.0)
MCH: 24.2 pg — ABNORMAL LOW (ref 26.0–34.0)
MCHC: 30.7 g/dL (ref 30.0–36.0)
MCV: 78.9 fL (ref 78.0–100.0)
MONO ABS: 0.7 10*3/uL (ref 0.1–1.0)
MONOS PCT: 9 % (ref 3–12)
Neutro Abs: 6.2 10*3/uL (ref 1.7–7.7)
Neutrophils Relative %: 73 % (ref 43–77)
PLATELETS: 276 10*3/uL (ref 150–400)
RBC: 5.08 MIL/uL (ref 4.22–5.81)
RDW: 16.3 % — ABNORMAL HIGH (ref 11.5–15.5)
WBC: 8.4 10*3/uL (ref 4.0–10.5)

## 2014-05-21 LAB — COMPREHENSIVE METABOLIC PANEL
ALT: 12 U/L (ref 0–53)
AST: 17 U/L (ref 0–37)
Albumin: 3.4 g/dL — ABNORMAL LOW (ref 3.5–5.2)
Alkaline Phosphatase: 102 U/L (ref 39–117)
BILIRUBIN TOTAL: 0.5 mg/dL (ref 0.3–1.2)
BUN: 28 mg/dL — AB (ref 6–23)
CHLORIDE: 102 meq/L (ref 96–112)
CO2: 23 meq/L (ref 19–32)
Calcium: 9.4 mg/dL (ref 8.4–10.5)
Creatinine, Ser: 0.99 mg/dL (ref 0.50–1.35)
GFR, EST AFRICAN AMERICAN: 80 mL/min — AB (ref >90)
GFR, EST NON AFRICAN AMERICAN: 69 mL/min — AB (ref >90)
Glucose, Bld: 164 mg/dL — ABNORMAL HIGH (ref 70–99)
Potassium: 4.6 mEq/L (ref 3.7–5.3)
Sodium: 139 mEq/L (ref 137–147)
Total Protein: 6.5 g/dL (ref 6.0–8.3)

## 2014-05-21 LAB — PRO B NATRIURETIC PEPTIDE: PRO B NATRI PEPTIDE: 2319 pg/mL — AB (ref 0–450)

## 2014-05-21 LAB — TROPONIN I

## 2014-05-21 MED ORDER — SODIUM CHLORIDE 0.9 % IV BOLUS (SEPSIS)
250.0000 mL | Freq: Once | INTRAVENOUS | Status: AC
  Administered 2014-05-21: 250 mL via INTRAVENOUS

## 2014-05-21 MED ORDER — SODIUM CHLORIDE 0.9 % IV SOLN
INTRAVENOUS | Status: DC
  Administered 2014-05-21: 14:00:00 via INTRAVENOUS

## 2014-05-21 MED ORDER — IOHEXOL 350 MG/ML SOLN
100.0000 mL | Freq: Once | INTRAVENOUS | Status: AC | PRN
  Administered 2014-05-21: 100 mL via INTRAVENOUS

## 2014-05-21 NOTE — ED Notes (Signed)
MD at bedside. 

## 2014-05-21 NOTE — Discharge Instructions (Signed)
Followup by phone with your cardiologist and cardiothoracic surgeons office tomorrow. About the left pleural effusion. Return for any newer worse symptoms. Return for any worse shortness of breath. As we discussed a CT scan showed no blood clots. Showed no pneumonia.

## 2014-05-21 NOTE — Assessment & Plan Note (Signed)
Bypass grafting on 04/24/2014 incision sites are stable no drainage no erythema.  EKG is stable

## 2014-05-21 NOTE — ED Notes (Signed)
Pt transported to XRAY °

## 2014-05-21 NOTE — ED Provider Notes (Signed)
CSN: [id number]     Arrival date & time 05/21/14  1023 History   First MD Initiated Contact with Patient 05/21/14 1044     Chief Complaint  Patient presents with  . Shortness of Breath     (Consider location/radiation/quality/duration/timing/severity/associated sxs/prior Treatment) Patient is a 80 y.o. male presenting with shortness of breath. The history is provided by the patient.  Shortness of Breath Associated symptoms: no abdominal pain, no chest pain, no fever, no headaches, no rash and no vomiting    patient status post CABG one month ago. Patient's had 2 weeks of gradual shortness of breath. Worse in the past 2 days. Patient without any significant chest pain. Patient has more difficulty breathing when laying flat. Patient is taking medications which includes Lasix. Patient's had no fever no nausea vomiting or diarrhea. No significant back pain.  Past Medical History  Diagnosis Date  . Stricture esophagus     distal  . GERD (gastroesophageal reflux disease)   . Hiatal hernia     6cm  . Coronary artery disease 04/21/2014      severe triple vessel  . Ischemic cardiomyopathy 03/2014    EF 25-30%   Past Surgical History  Procedure Laterality Date  . Cholecystectomy    . Shoulder surgery    . Appendectomy    . Esophagogastroduodenoscopy  04/09/2012    Procedure: ESOPHAGOGASTRODUODENOSCOPY (EGD);  Surgeon: Gatha Mayer, MD;  Location: [facility];  Service: Endoscopy;  Laterality: N/A;  . Coronary artery bypass graft N/A 04/24/2014    Procedure: CORONARY ARTERY BYPASS GRAFTING (CABG);  Surgeon: Gaye Pollack, MD;  Location: [place];  Service: Open Heart Surgery;  Laterality: N/A;  Times 3 using left internal mammary artery and endoscopically harvested right saphenous vein  . Intraoperative transesophageal echocardiogram N/A 04/24/2014    Procedure: INTRAOPERATIVE TRANSESOPHAGEAL ECHOCARDIOGRAM;  Surgeon: Gaye Pollack, MD;  Location: [facility] OR;  Service: Open Heart Surgery;   Laterality: N/A;   Family History  Problem Relation Age of Onset  . Heart disease Mother   . Kidney disease Father    History  Substance Use Topics  . Smoking status: Never Smoker   . Smokeless tobacco: Never Used  . Alcohol Use: 0.5 oz/week    1 drink(s) per week     Comment: rare    Review of Systems  Constitutional: Negative for fever.  HENT: Negative for congestion.   Eyes: Negative for visual disturbance.  Respiratory: Positive for shortness of breath.   Cardiovascular: Negative for chest pain.  Gastrointestinal: Negative for nausea, vomiting and abdominal pain.  Genitourinary: Negative for dysuria.  Musculoskeletal: Negative for back pain.  Skin: Negative for rash.  Neurological: Negative for headaches.  Hematological: Does not bruise/bleed easily.  Psychiatric/Behavioral: Negative for confusion.      Allergies  Sulfa antibiotics  Home Medications   Prior to Admission medications   Medication Sig Start Date End Date Taking? Authorizing Provider  acetaminophen (TYLENOL) 325 MG tablet Take 650 mg by mouth every 4 (four) hours as needed for mild pain or moderate pain.   Yes Historical Provider, MD  aspirin EC 325 MG tablet Take 325 mg by mouth every morning.   Yes Historical Provider, MD  atorvastatin (LIPITOR) 80 MG tablet Take 80 mg by mouth every evening.   Yes Historical Provider, MD  B Complex-C-Calcium (B-COMPLEX/VITAMIN C PO) Take 1 tablet by mouth every morning.    Yes Historical Provider, MD  calcium carbonate (OS-CAL) 600 MG TABS tablet Take 600  mg by mouth every morning.   Yes Historical Provider, MD  carvedilol (COREG) 6.25 MG tablet Take 6.25 mg by mouth 2 (two) times daily with a meal.   Yes Historical Provider, MD  dextromethorphan-guaiFENesin (MUCINEX DM) 30-600 MG per 12 hr tablet Take 1 tablet by mouth 2 (two) times daily as needed for cough.   Yes Historical Provider, MD  digoxin (LANOXIN) 0.125 MG tablet Take 0.125 mg by mouth every other day.    Yes Historical Provider, MD  docusate sodium (COLACE) 100 MG capsule Take 100 mg by mouth 2 (two) times daily.   Yes Historical Provider, MD  ferrous sulfate 325 (65 FE) MG tablet Take 1 tablet (325 mg total) by mouth daily with breakfast. For one month then stop. 04/28/14  Yes Donielle Liston Alba, PA-C  folic acid (FOLVITE) 1 MG tablet Take 1 tablet (1 mg total) by mouth daily. For one month then stop. 04/28/14  Yes Donielle Liston Alba, PA-C  furosemide (LASIX) 20 MG tablet Take 20 mg by mouth every morning.   Yes Historical Provider, MD  lisinopril (PRINIVIL,ZESTRIL) 2.5 MG tablet Take 2.5 mg by mouth every morning.   Yes Historical Provider, MD  pantoprazole (PROTONIX) 40 MG tablet Take 40 mg by mouth every morning.   Yes Historical Provider, MD  polycarbophil (FIBERCON) 625 MG tablet Take 625 mg by mouth every morning.    Yes Historical Provider, MD  polyethylene glycol (MIRALAX / GLYCOLAX) packet Take 17 g by mouth every morning.    Yes Historical Provider, MD  potassium chloride SA (K-DUR,KLOR-CON) 20 MEQ tablet Take 20 mEq by mouth every morning.   Yes Historical Provider, MD  tobramycin-dexamethasone [facility]) ophthalmic solution Place 1 drop into the right eye 2 (two) times daily. 05/17/14 05/21/14 Yes Historical Provider, MD   BP 137/66  Pulse 76  Temp(Src) 97.7 F (36.5 C) (Oral)  Resp 26  SpO2 99% Physical Exam  Nursing note and vitals reviewed. Constitutional: He is oriented to person, place, and time. He appears well-developed and well-nourished. No distress.  HENT:  Head: Normocephalic and atraumatic.  Mouth/Throat: Oropharynx is clear and moist.  Eyes: Conjunctivae and EOM are normal. Pupils are equal, round, and reactive to light.  Neck: Normal range of motion.  Cardiovascular: Normal rate, regular rhythm and normal heart sounds.   No murmur heard. Pulmonary/Chest: Effort normal and breath sounds normal. No respiratory distress.  Decreased breath sounds on the left. Well  healing sternotomy scar incision.  Abdominal: Soft. Bowel sounds are normal. There is no tenderness.  Musculoskeletal: Normal range of motion. He exhibits no edema.  Neurological: He is alert and oriented to person, place, and time. No cranial nerve deficit. He exhibits normal muscle tone. Coordination normal.  Skin: Skin is warm. No rash noted.    ED Course  Procedures (including critical care time) Labs Review Labs Reviewed  COMPREHENSIVE METABOLIC PANEL - Abnormal; Notable for the following:    Glucose, Bld 164 (*)    BUN 28 (*)    Albumin 3.4 (*)    GFR calc non Af Amer 69 (*)    GFR calc Af Amer 80 (*)    All other components within normal limits  CBC WITH DIFFERENTIAL - Abnormal; Notable for the following:    Hemoglobin 12.3 (*)    MCH 24.2 (*)    RDW 16.3 (*)    All other components within normal limits  TROPONIN I  PRO B NATRIURETIC PEPTIDE   Results for orders placed during  the hospital encounter of 05/21/14  COMPREHENSIVE METABOLIC PANEL      Result Value Ref Range   Sodium 139  137 - 147 mEq/L   Potassium 4.6  3.7 - 5.3 mEq/L   Chloride 102  96 - 112 mEq/L   CO2 23  19 - 32 mEq/L   Glucose, Bld 164 (*) 70 - 99 mg/dL   BUN 28 (*) 6 - 23 mg/dL   Creatinine, Ser 0.99  0.50 - 1.35 mg/dL   Calcium 9.4  8.4 - 10.5 mg/dL   Total Protein 6.5  6.0 - 8.3 g/dL   Albumin 3.4 (*) 3.5 - 5.2 g/dL   AST 17  0 - 37 U/L   ALT 12  0 - 53 U/L   Alkaline Phosphatase 102  39 - 117 U/L   Total Bilirubin 0.5  0.3 - 1.2 mg/dL   GFR calc non Af Amer 69 (*) >90 mL/min   GFR calc Af Amer 80 (*) >90 mL/min  TROPONIN I      Result Value Ref Range   Troponin I <0.30  <0.30 ng/mL  CBC WITH DIFFERENTIAL      Result Value Ref Range   WBC 8.4  4.0 - 10.5 K/uL   RBC 5.08  4.22 - 5.81 MIL/uL   Hemoglobin 12.3 (*) 13.0 - 17.0 g/dL   HCT 40.1  39.0 - 52.0 %   MCV 78.9  78.0 - 100.0 fL   MCH 24.2 (*) 26.0 - 34.0 pg   MCHC 30.7  30.0 - 36.0 g/dL   RDW 16.3 (*) 11.5 - 15.5 %   Platelets  276  150 - 400 K/uL   Neutrophils Relative % 73  43 - 77 %   Neutro Abs 6.2  1.7 - 7.7 K/uL   Lymphocytes Relative 14  12 - 46 %   Lymphs Abs 1.2  0.7 - 4.0 K/uL   Monocytes Relative 9  3 - 12 %   Monocytes Absolute 0.7  0.1 - 1.0 K/uL   Eosinophils Relative 4  0 - 5 %   Eosinophils Absolute 0.3  0.0 - 0.7 K/uL   Basophils Relative 0  0 - 1 %   Basophils Absolute 0.0  0.0 - 0.1 K/uL    Imaging Review Dg Chest 2 View  05/21/2014   CLINICAL DATA:  Back pain  EXAM: CHEST  2 VIEW  COMPARISON:  05/18/2014  FINDINGS: Cardiomediastinal silhouette is stable. Status post CABG. Persistent left pleural effusion with left basilar atelectasis or infiltrate.  IMPRESSION: Persistent left pleural effusion with left basilar atelectasis or infiltrate. No pulmonary edema.   Electronically Signed   By: Lahoma Crocker M.D.   On: 05/21/2014 12:41   Ct Angio Chest Pe W/cm &/or Wo Cm  05/21/2014   CLINICAL DATA:  SHORTNESS OF BREATH  EXAM: CT ANGIOGRAPHY CHEST WITH CONTRAST  TECHNIQUE: Multidetector CT imaging of the chest was performed using the standard protocol during bolus administration of intravenous contrast. Multiplanar CT image reconstructions and MIPs were obtained to evaluate the vascular anatomy.  CONTRAST:  163mL OMNIPAQUE IOHEXOL 350 MG/ML SOLN  COMPARISON:  04/06/2014  FINDINGS: Satisfactory opacification of pulmonary arteries noted, and there is no evidence of pulmonary emboli. There is only early contrast opacification of the thoracic aorta. Extensive coronary and aortic calcifications. Previous CABG. Moderate left pleural effusion has developed since prior study. Subcentimeter prevascular and precarinal lymph nodes. No hilar adenopathy. New mild dependent atelectasis in both lower lobes left greater than right.  Review of the MIP images confirms the above findings.  IMPRESSION: 1. Negative for acute PE. 2. New moderate left pleural effusion with adjacent atelectasis.   Electronically Signed   By: Arne Cleveland M.D.   On: 05/21/2014 13:57     EKG Interpretation   Date/Time:  Sunday May 21 2014 10:31:18 EDT Ventricular Rate:  83 PR Interval:  96 QRS Duration: 118 QT Interval:  382 QTC Calculation: 449 R Axis:   67 Text Interpretation:  Sinus rhythm Short PR interval Nonspecific  intraventricular conduction delay Baseline wander in lead(s) V4 No  significant change was found Confirmed by ZACKOWSKI  MD, SCOTT ([id number]) on  05/21/2014 10:46:02 AM      MDM   Final diagnoses:  Pleural effusion on left    New left pleural effusion. Probably related to recent open heart surgery. Patient without hypoxia. Room air saturations are 96%. Patient would prefer to go home. Based on his vital signs in his symptoms being minimal at this point in time patient can be discharged home with followup with his cardiologist and notifying his cardiothoracic surgeon. Patient understands that drainage of this pleural effusion may be required in the future. Patient knows to return for any worse shortness of breath or any new or worse symptoms.  Patient is nontoxic no acute distress. Room air sats are 95% or better. Patient without any significant shortness of breath.  Fredia Sorrow, MD 05/21/14 1501

## 2014-05-21 NOTE — Assessment & Plan Note (Signed)
EF 25-30% he has some mild acute systolic heart failure today have resumed his Lasix and potassium.

## 2014-05-21 NOTE — Assessment & Plan Note (Addendum)
Right eye has been irritated the sclera is injected yellow crusty drainage Tobramax with steroid opthalmic drops were ordered. To use for 5 days.

## 2014-05-21 NOTE — ED Notes (Signed)
Patient transported to CT 

## 2014-05-21 NOTE — ED Notes (Addendum)
Pt denies pain. Surgical wound healing well . No nausea.

## 2014-05-21 NOTE — ED Notes (Signed)
Pt from home c/o increasing SOB since he had quad bypass 1 month ago. Pt states that he has more difficulty breathing while lying flat. Pt is taking rx'd lasix. Pt denies CP, N/V/D/F. Pt is A&O and in NAD

## 2014-05-21 NOTE — Assessment & Plan Note (Signed)
Patient with increasing episodes of shortness of breath with rales today on exam. We'll check chest x-ray proBNP and add Lasix to his medical regimen blood pressure currently is stable but with his age we'll need to go fairly slow steady does not get lightheaded or dizzy. Also adjustments with his potassium were made

## 2014-05-23 ENCOUNTER — Telehealth: Payer: Self-pay | Admitting: Cardiovascular Disease

## 2014-05-23 MED ORDER — FUROSEMIDE 20 MG PO TABS: 40.0000 mg | ORAL_TABLET | Freq: Every morning | ORAL | Status: DC

## 2014-05-23 NOTE — Telephone Encounter (Signed)
Daughter states patient went to ER on Sunday - chest  xray showed pleural effusion.  She states that her father did not sleep well last night- rough. No appetite She states they had the head of his bed elevated but he stated he could not rest laying down so he sat on the side on the bed.daughter is driving ,she does not what dose of lasix her father is taking at present.  She wanted to know what else could be done.  Appointment for 05/24/14 already schedule.  Will defer to Cecilie Kicks NP

## 2014-05-23 NOTE — Telephone Encounter (Signed)
Spoke to [employer] NP [place] PA ON 05/24/14. NOTIFIED DAUGHTER - [name]

## 2014-05-23 NOTE — Telephone Encounter (Signed)
Bruce Green was in the ER recently with shortness of breath.  He was told that he had a pleural effusion.  His Lasix dosage was cut and the patient is still having shortness of breath especially at night.  He does have an appointment tomorrow 05/24/14 with Ellen Henri, PA, but Mrs. Pearlie Oyster is concerned about the continued shortness of breath.

## 2014-05-23 NOTE — Telephone Encounter (Signed)
Spoke to daughter.  Informed her of Cecilie Kicks NP. Daughter states she called [facility] earlier, and someone there told her to call cardiologist .RN informed daughter to give lasix 40 mg daily as order by Mickel Baas NP.  Will call back after speaking with Mickel Baas concerning [facility].

## 2014-05-23 NOTE — Telephone Encounter (Signed)
I would ask her to call [facility] office, they may need to drain the effusion if the lasix is not helping, though at this point pt should be on 40 mg lasix daily, he had been on 20 mg to see if this will help in the meantime.

## 2014-05-24 ENCOUNTER — Ambulatory Visit (INDEPENDENT_AMBULATORY_CARE_PROVIDER_SITE_OTHER): Payer: Medicare Other | Admitting: Cardiology

## 2014-05-24 ENCOUNTER — Encounter: Payer: Self-pay | Admitting: Cardiology

## 2014-05-24 VITALS — BP 110/58 | HR 84 | Ht 70.0 in | Wt 151.7 lb

## 2014-05-24 DIAGNOSIS — Z79899 Other long term (current) drug therapy: Secondary | ICD-10-CM

## 2014-05-24 MED ORDER — FUROSEMIDE 40 MG PO TABS: 40.0000 mg | ORAL_TABLET | Freq: Every day | ORAL | Status: DC

## 2014-05-24 NOTE — Patient Instructions (Signed)
Bruce Jester, PA-C recommends that you continue the Lasix 40 mg daily and Potassium 20 mEq daily for ONE WEEK.   Your physician recommends you have blood work done in [employer].  Please keep both appointments with Dr Cyndia Bent and Dr Sallyanne Kuster.

## 2014-05-24 NOTE — Progress Notes (Signed)
Patient ID: Bruce Green, male   DOB: 10-17-1919, 80 y.o.   MRN: [id number]    05/24/2014 Bruce Green   [birth year]  [id number]  Primary Physicia  Bruce Crutch, MD Primary Cardiologist: Dr. Sallyanne Green  HPI:  Bruce Green presents to clinic today for follow-up regarding SOB. He is s/p CABG, by Dr. Caffie Pinto, on 04/24/14 with a LIMA to the LAD, VG to the OM2 and VG to the distal RCA. Prior to undergoing CABG, he was noted to have severe LV systolic function, by cardiac catheterization, with an estimated EF of 25-30%.  He was recently seen in clinic by Bruce Kicks, NP, on 05/17/14 for post hospital follow-up. At that time, he complained of SOB with a productive cough with white colored sputum. He denied fever and chills. Subsequently, a CXR was obtained as well as a proBNP. His CXR demonstrated increase in volume of a small left pleural effusion (compared to prior CXR) with increase left basilar atelectasis. His proBNP was also elevated at 3629. He was instructed to take Lasix, 20 mg. He was instructed to follow-up. However, prior to f/u, he went to the ER for worsening dyspnea. He was w/o hypoxia. O2 sats were 96% on RA. He had a repeat CXR which demonstrated a persistent left pleural effusion with basilar atelecatis. There was no pulmonary edema. A CT of the chest was also obtained, which was negative for acute PE. The pleural effusion was noted to be moderate in size. Per ED note, he was not in acute distress and because his O2 saturation was WNL, he was released from the ED. He was instructed to increase his Lasix to 40 mg daily.   He presents back to clinic today for f/u. He continues to complain of SOB, both with exertion and, at times, with rest. He notes 2 pillow orthopnea. He denies weight gain, increased abdominal girth and LEE. He also denies CP. He has been compliant with Lasix. He started on his increased dose of 40 mg for the first time today. He notes slight improvement today, compared to yesterday.     Current Outpatient Prescriptions  Medication Sig Dispense Refill  . acetaminophen (TYLENOL) 325 MG tablet Take 650 mg by mouth every 4 (four) hours as needed for mild pain or moderate pain.      Marland Kitchen aspirin EC 325 MG tablet Take 325 mg by mouth every morning.      Marland Kitchen atorvastatin (LIPITOR) 80 MG tablet Take 80 mg by mouth every evening.      . B Complex-C-Calcium (B-COMPLEX/VITAMIN C PO) Take 1 tablet by mouth every morning.       . calcium carbonate (OS-CAL) 600 MG TABS tablet Take 600 mg by mouth every morning.      . carvedilol (COREG) 6.25 MG tablet Take 6.25 mg by mouth 2 (two) times daily with a meal.      . dextromethorphan-guaiFENesin (MUCINEX DM) 30-600 MG per 12 hr tablet Take 1 tablet by mouth 2 (two) times daily as needed for cough.      . digoxin (LANOXIN) 0.125 MG tablet Take 0.5 tablet (0.0625 mg total) by mouth every other day, alternating with 1 tablet by mouth.      . docusate sodium (COLACE) 100 MG capsule Take 100 mg by mouth 2 (two) times daily.      . ferrous sulfate 325 (65 FE) MG tablet Take 1 tablet (325 mg total) by mouth daily with breakfast. For one month then stop.    3  .  folic acid (FOLVITE) 1 MG tablet Take 1 tablet (1 mg total) by mouth daily. For one month then stop.  30 tablet  0  . furosemide (LASIX) 20 MG tablet Take 2 tablets (40 mg total) by mouth every morning.  60 tablet  5  . lisinopril (PRINIVIL,ZESTRIL) 2.5 MG tablet Take 2.5 mg by mouth every morning.      . pantoprazole (PROTONIX) 40 MG tablet Take 40 mg by mouth every morning.      . polycarbophil (FIBERCON) 625 MG tablet Take 625 mg by mouth every morning.       . polyethylene glycol (MIRALAX / GLYCOLAX) packet Take 17 g by mouth every evening.       . potassium chloride SA (K-DUR,KLOR-CON) 20 MEQ tablet Take 20 mEq by mouth every morning.      . furosemide (LASIX) 40 MG tablet Take 1 tablet (40 mg total) by mouth daily.  30 tablet  3   No current facility-administered medications for this  visit.    Allergies  Allergen Reactions  . Sulfa Antibiotics Rash    History   Social History  . Marital Status: Widowed    Spouse Name: N/A    Number of Children: N/A  . Years of Education: N/A   Occupational History  . retired    Social History Main Topics  . Smoking status: Never Smoker   . Smokeless tobacco: Never Used  . Alcohol Use: 0.5 oz/week    1 drink(s) per week     Comment: rare  . Drug Use: No  . Sexual Activity: Not on file   Other Topics Concern  . Not on file   Social History Narrative   Retired Conservation officer, historic buildings. Widowed.           Review of Systems: General: negative for chills, fever, night sweats or weight changes.  Cardiovascular: negative for chest pain, dyspnea on exertion, edema, orthopnea, palpitations, paroxysmal nocturnal dyspnea or shortness of breath Dermatological: negative for rash Respiratory: negative for cough or wheezing Urologic: negative for hematuria Abdominal: negative for nausea, vomiting, diarrhea, bright red blood per rectum, melena, or hematemesis Neurologic: negative for visual changes, syncope, or dizziness All other systems reviewed and are otherwise negative except as noted above.    Blood pressure 110/58, pulse 84, height 5\' 10"  (1.778 m), weight 151 lb 11.2 oz (68.811 kg).  General appearance: alert, cooperative and no distress Neck: no carotid bruit and no JVD Lungs: decreased BS over left base. Mild left-sided basilar rales. Right lung fields CTA. Heart: regular rate and rhythm, S1, S2 normal, no murmur, click, rub or gallop Extremities: no LEE Pulses: 2+ and symmetric Skin: warm and dry Neurologic: Grossly normal  EKG Not performed  ASSESSMENT AND PLAN:   1. SOB: He does not appear volume overloaded on physical exam, w/o JVD and peripheral edema. Recent F/u CXR demonstrated increase in the size of a known left-sided pleural effusion. Today marks day 1 of his increased dose of Lasix to 40 mg. He has normal  renal function with a SCr of 0.99. The case was discussed with Dr. Claiborne Billings. We will continue him on 40 mg of Lasix daily to see if this will help resolve his pleural effusion. He is to continue with daily potassium supplementation. We will repeat a BMP in 1 week, to reassess both renal function and electrolytes. He is scheduled to  f/u with Dr. Cyndia Bent on 05/31/14. If he continues to have persistent or worsening symptoms, he may require a  repeat CXR and may require pleurocentesis for symptomatic relief. Will defer to Dr. Cyndia Bent.   PLAN   Continue 40 mg of Lasix with daily potassium supplementation. BMP in 1 week. F/u with Dr. Cyndia Bent on 7/8. If symptoms improve, he is advised to keep already scheduled appointment with Dr. Sallyanne Green on 8/3. If not improved, he should f/u with an APP sooner.   [place], [place] 05/24/2014 9:04 PM

## 2014-05-25 ENCOUNTER — Telehealth: Payer: Self-pay

## 2014-05-25 NOTE — Telephone Encounter (Signed)
Orders faxed to [facility]- cardiac rehab for patient per Dr Roxan Hockey

## 2014-05-29 ENCOUNTER — Emergency Department (HOSPITAL_COMMUNITY)
Admission: EM | Admit: 2014-05-29 | Discharge: 2014-05-29 | Disposition: A | Discharge status: Home / Self Care | Payer: Medicare Other | Attending: Emergency Medicine | Admitting: Emergency Medicine

## 2014-05-29 ENCOUNTER — Encounter (HOSPITAL_COMMUNITY): Payer: Self-pay | Admitting: Emergency Medicine

## 2014-05-29 ENCOUNTER — Emergency Department (HOSPITAL_COMMUNITY): Payer: Medicare Other

## 2014-05-29 DIAGNOSIS — Z79899 Other long term (current) drug therapy: Secondary | ICD-10-CM | POA: Insufficient documentation

## 2014-05-29 DIAGNOSIS — Z7982 Long term (current) use of aspirin: Secondary | ICD-10-CM | POA: Insufficient documentation

## 2014-05-29 DIAGNOSIS — I2510 Atherosclerotic heart disease of native coronary artery without angina pectoris: Secondary | ICD-10-CM | POA: Insufficient documentation

## 2014-05-29 DIAGNOSIS — Z951 Presence of aortocoronary bypass graft: Secondary | ICD-10-CM | POA: Insufficient documentation

## 2014-05-29 DIAGNOSIS — K219 Gastro-esophageal reflux disease without esophagitis: Secondary | ICD-10-CM | POA: Insufficient documentation

## 2014-05-29 DIAGNOSIS — Z48812 Encounter for surgical aftercare following surgery on the circulatory system: Secondary | ICD-10-CM

## 2014-05-29 DIAGNOSIS — J90 Pleural effusion, not elsewhere classified: Secondary | ICD-10-CM

## 2014-05-29 LAB — COMPREHENSIVE METABOLIC PANEL
ALK PHOS: 87 U/L (ref 39–117)
ALT: 14 U/L (ref 0–53)
AST: 49 U/L — AB (ref 0–37)
Albumin: 3.6 g/dL (ref 3.5–5.2)
Anion gap: 17 — ABNORMAL HIGH (ref 5–15)
BUN: 31 mg/dL — ABNORMAL HIGH (ref 6–23)
CO2: 21 meq/L (ref 19–32)
Calcium: 9.3 mg/dL (ref 8.4–10.5)
Chloride: 95 mEq/L — ABNORMAL LOW (ref 96–112)
Creatinine, Ser: 1.15 mg/dL (ref 0.50–1.35)
GFR, EST AFRICAN AMERICAN: 62 mL/min — AB (ref >90)
GFR, EST NON AFRICAN AMERICAN: 53 mL/min — AB (ref >90)
Glucose, Bld: 103 mg/dL — ABNORMAL HIGH (ref 70–99)
Potassium: 5.7 mEq/L — ABNORMAL HIGH (ref 3.7–5.3)
SODIUM: 133 meq/L — AB (ref 137–147)
Total Bilirubin: 0.5 mg/dL (ref 0.3–1.2)
Total Protein: 6.7 g/dL (ref 6.0–8.3)

## 2014-05-29 LAB — TROPONIN I: Troponin I: <0.3 ng/mL (ref <0.30)

## 2014-05-29 LAB — BASIC METABOLIC PANEL
Anion gap: 13 (ref 5–15)
BUN: 33 mg/dL — ABNORMAL HIGH (ref 6–23)
CALCIUM: 9.2 mg/dL (ref 8.4–10.5)
CHLORIDE: 98 meq/L (ref 96–112)
CO2: 27 mEq/L (ref 19–32)
Creatinine, Ser: 1.3 mg/dL (ref 0.50–1.35)
GFR calc Af Amer: 53 mL/min — ABNORMAL LOW (ref >90)
GFR calc non Af Amer: 46 mL/min — ABNORMAL LOW (ref >90)
GLUCOSE: 114 mg/dL — AB (ref 70–99)
Potassium: 4.7 mEq/L (ref 3.7–5.3)
SODIUM: 138 meq/L (ref 137–147)

## 2014-05-29 LAB — CBC
HEMATOCRIT: 41.7 % (ref 39.0–52.0)
Hemoglobin: 13.1 g/dL (ref 13.0–17.0)
MCH: 24.8 pg — ABNORMAL LOW (ref 26.0–34.0)
MCHC: 31.4 g/dL (ref 30.0–36.0)
MCV: 78.8 fL (ref 78.0–100.0)
Platelets: 230 10*3/uL (ref 150–400)
RBC: 5.29 MIL/uL (ref 4.22–5.81)
RDW: 17.3 % — ABNORMAL HIGH (ref 11.5–15.5)
WBC: 10.9 10*3/uL — AB (ref 4.0–10.5)

## 2014-05-29 LAB — DIGOXIN LEVEL: Digoxin Level: 0.9 ng/mL (ref 0.8–2.0)

## 2014-05-29 LAB — PRO B NATRIURETIC PEPTIDE: Pro B Natriuretic peptide (BNP): 2106 pg/mL — ABNORMAL HIGH (ref 0–450)

## 2014-05-29 MED ORDER — SODIUM CHLORIDE 0.9 % IV SOLN
INTRAVENOUS | Status: DC
  Administered 2014-05-29: 15:00:00 via INTRAVENOUS

## 2014-05-29 NOTE — ED Notes (Signed)
Patient presents today with a chief complaint of shortness of breath, cough, and congestion intermittently since June 1st (triple bypass surgery) with worsening symptoms last night. Patient ambulatory in department with mild SOB, oxygen saturation 97% on RA.

## 2014-05-29 NOTE — ED Notes (Signed)
Upon entering the room, pt transported to xray.

## 2014-05-29 NOTE — ED Notes (Addendum)
Pt reports ongoing SOB and productive cough with green sputum since surgery on 6/1. Pt reports difficulty sleeping due to SOB.

## 2014-05-29 NOTE — ED Notes (Signed)
Pt transported to xray 

## 2014-05-29 NOTE — Discharge Instructions (Signed)
Pleural Effusion The lining covering your lungs and the inside of your chest is called the pleura. Usually, the space between the 2 pleura contains no air and only a thin layer of fluid. A pleural effusion is an abnormal buildup of fluid in the pleural space. Fluid gathers when there is increased pressure in the lung vessels. This forces fluids out of the lungs and into the pleural space. Vessels may also leak fluids when there are infections, such as pneumonia, or other causes of soreness and redness (inflammation). Fluids leak into the lungs when protein in the blood is low or when certain vessels (lymphatics) are blocked. Finding a pleural effusion is important because it is usually caused by another disease. In order to treat a pleural effusion, your health care provider needs to find its cause. If left untreated, a large amount of fluid can build up and cause collapse of the lung. CAUSES   Heart failure.  Infections (pneumonia, tuberculosis), pulmonary embolism, pulmonary infarction.  Cancer (primary lung and metastatic), asbestosis.  Liver failure (cirrhosis).  Nephrotic syndrome, peritoneal dialysis, kidney problems (uremia).  Collagen vascular disease (systemic lupus erythematosis, rheumatoid arthritis).  Injury (trauma) to the chest or rupture of the digestive tube (esophagus).  Material in the chest or pleural space (hemothorax, chylothorax).  Pancreatitis.  Surgery.  Drug reactions. SYMPTOMS  A pleural effusion can decrease the amount of space available for breathing and make you short of breath. The fluid can become infected, which may cause pain and fever. Often, the pain is worse when taking a deep breath. The underlying disease (heart failure, pneumonia, blood clot, tuberculosis, cancer) may also cause symptoms. DIAGNOSIS   Your health care provider can usually tell what is wrong by talking to you (taking a history), doing an exam, and taking a routine X-ray. If the  X-ray shows fluid in your chest, often fluid is removed from your chest with a needle for testing (diagnostic thoracentesis).  Sometimes, more specialized X-rays may be needed.  Sometimes, a small piece of tissue is removed and examined by a specialist (biopsy). TREATMENT  Treatment varies based on what caused the pleural effusion. Treatments include:  Removing as much fluid as possible using a needle (thoracentesis) to improve the cough and shortness of breath. This is a simple procedure which can be done at bedside. The risks are bleeding, infection, collapse of a lung, or low blood pressure.  Placing a tube in the chest to drain the effusion (tube thoracostomy). This is often used when there is an infection in the fluid. This is a simple procedure which can often be done at bedside or in a clinic. The procedure may be painful. The risks are the same as using a needle to drain the fluid. The chest tube usually remains for a few days and is connected to suction to improve fluid drainage. The tube, after placement, usually does not cause much discomfort.  Surgical removal of fibrous debris in and around the pleural space (decortication). This may be done with a flexible telescope (thoracoscope) through a small or large cut (incision). This is helpful for patients who have fibrosis or scar tissue that prevents complete lung expansion. The risks are infection, blood loss, and side effects from general anesthesia.  Sometimes, a procedure called pleurodesis is done. A chest tube is placed and the fluid is drained. Next, an agent (tetracycline, talc powder) is added to the pleural space. This causes the lung and chest wall to stick together (adhesion). This leaves no  potential space for fluid to build up. The risks include infection, blood loss, and side effects from general anesthesia.  If the effusion is caused by infection, it may be treated with antibiotics and improve without draining. HOME CARE  INSTRUCTIONS   Take any medicines exactly as prescribed.  Follow up with your health care provider as directed.  Monitor your exercise capacity (the amount of walking you can do before you get short of breath).  Do not use any tobacco products including cigaretts, chewing tobacco, or electronic cigarettes. SEEK MEDICAL CARE IF:   Your exercise capacity seems to get worse or does not improve with time.  You do not recover from your illness.  You have drainage, redness, swelling, or pain at any incision or puncture sites. SEEK IMMEDIATE MEDICAL CARE IF:   Shortness of breath or chest pain develops or gets worse.  You have a fever.  You develop a new cough, especially if the mucus (phlegm) is discolored. MAKE SURE YOU:   Understand these instructions.  Will watch your condition.  Will get help right away if you are not doing well or get worse. Document Released: 11/10/2005 Document Revised: 11/15/2013 Document Reviewed: 07/02/2007 [facility] Patient Information 2015 [place], [place]. This information is not intended to replace advice given to you by your health care provider. Make sure you discuss any questions you have with your health care provider.

## 2014-05-29 NOTE — ED Provider Notes (Signed)
CSN: [id number]     Arrival date & time 05/29/14  1336 History   First MD Initiated Contact with Patient 05/29/14 1506     Chief Complaint  Patient presents with  . Shortness of Breath     (Consider location/radiation/quality/duration/timing/severity/associated sxs/prior Treatment) HPI 5 weeks post-CABG orthopnea with left pleural effusion, recheck at CT surg in 2 days, increased Lasix from 20 to 40mg  daily per Cards, not getting better as fast as he thought he would, no fever/CP/syncope; is walking well. Past Medical History  Diagnosis Date  . Stricture esophagus     distal  . GERD (gastroesophageal reflux disease)   . Hiatal hernia     6cm  . Coronary artery disease 04/21/2014      severe triple vessel  . Ischemic cardiomyopathy 03/2014    EF 25-30%   Past Surgical History  Procedure Laterality Date  . Cholecystectomy    . Shoulder surgery    . Appendectomy    . Esophagogastroduodenoscopy  04/09/2012    Procedure: ESOPHAGOGASTRODUODENOSCOPY (EGD);  Surgeon: Gatha Mayer, MD;  Location: [facility];  Service: Endoscopy;  Laterality: N/A;  . Coronary artery bypass graft N/A 04/24/2014    Procedure: CORONARY ARTERY BYPASS GRAFTING (CABG);  Surgeon: Gaye Pollack, MD;  Location: [facility];  Service: Open Heart Surgery;  Laterality: N/A;  Times 3 using left internal mammary artery and endoscopically harvested right saphenous vein  . Intraoperative transesophageal echocardiogram N/A 04/24/2014    Procedure: INTRAOPERATIVE TRANSESOPHAGEAL ECHOCARDIOGRAM;  Surgeon: Gaye Pollack, MD;  Location: [facility] OR;  Service: Open Heart Surgery;  Laterality: N/A;   Family History  Problem Relation Age of Onset  . Heart disease Mother   . Kidney disease Father    History  Substance Use Topics  . Smoking status: Never Smoker   . Smokeless tobacco: Never Used  . Alcohol Use: 0.5 oz/week    1 drink(s) per week     Comment: rare    Review of Systems 10 Systems reviewed and are negative for acute  change except as noted in the HPI.   Allergies  Sulfa antibiotics  Home Medications   Prior to Admission medications   Medication Sig Start Date End Date Taking? Authorizing Provider  acetaminophen (TYLENOL) 325 MG tablet Take 650 mg by mouth every 4 (four) hours as needed for mild pain or moderate pain.   Yes Historical Provider, MD  aspirin EC 325 MG tablet Take 325 mg by mouth every morning.   Yes Historical Provider, MD  atorvastatin (LIPITOR) 80 MG tablet Take 80 mg by mouth every evening.   Yes Historical Provider, MD  B Complex-C-Calcium (B-COMPLEX/VITAMIN C PO) Take 1 tablet by mouth every morning.    Yes Historical Provider, MD  calcium carbonate (OS-CAL) 600 MG TABS tablet Take 600 mg by mouth every morning.   Yes Historical Provider, MD  carvedilol (COREG) 6.25 MG tablet Take 6.25 mg by mouth 2 (two) times daily with a meal.   Yes Historical Provider, MD  dextromethorphan-guaiFENesin (MUCINEX DM) 30-600 MG per 12 hr tablet Take 1 tablet by mouth 2 (two) times daily as needed for cough.   Yes Historical Provider, MD  digoxin (LANOXIN) 0.125 MG tablet Take 0.0625-0.125 mg by mouth daily. Take 0.5 tablet (0.0625 mg total) by mouth every other day, alternating with 1 tablet by mouth.   Yes Historical Provider, MD  docusate sodium (COLACE) 100 MG capsule Take 100 mg by mouth daily.    Yes Historical Provider, MD  ferrous sulfate 325 (65 FE) MG tablet Take 1 tablet (325 mg total) by mouth daily with breakfast. For one month then stop. 04/28/14  Yes Donielle Liston Alba, PA-C  folic acid (FOLVITE) 1 MG tablet Take 1 tablet (1 mg total) by mouth daily. For one month then stop. 04/28/14  Yes Donielle Liston Alba, PA-C  furosemide (LASIX) 20 MG tablet Take 2 tablets (40 mg total) by mouth every morning. 05/23/14  Yes Cecilie Kicks, NP  furosemide (LASIX) 40 MG tablet Take 1 tablet (40 mg total) by mouth daily. 05/24/14  Yes Brittainy Simmons, PA-C  lisinopril (PRINIVIL,ZESTRIL) 2.5 MG tablet Take 2.5  mg by mouth every morning.   Yes Historical Provider, MD  pantoprazole (PROTONIX) 40 MG tablet Take 40 mg by mouth every morning.   Yes Historical Provider, MD  polycarbophil (FIBERCON) 625 MG tablet Take 625 mg by mouth every morning.    Yes Historical Provider, MD  polyethylene glycol (MIRALAX / GLYCOLAX) packet Take 17 g by mouth daily as needed for moderate constipation.    Yes Historical Provider, MD  potassium chloride SA (K-DUR,KLOR-CON) 20 MEQ tablet Take 20 mEq by mouth every morning.   Yes Historical Provider, MD  Vitamin D, Ergocalciferol, (DRISDOL) 50000 UNITS CAPS capsule Take 50,000 Units by mouth every Saturday.   Yes Historical Provider, MD   BP 102/59  Pulse 78  Temp(Src) 97.9 F (36.6 C) (Oral)  Resp 18  SpO2 96% Physical Exam  Nursing note and vitals reviewed. Constitutional:  Awake, alert, nontoxic appearance.  HENT:  Head: Atraumatic.  Eyes: Right eye exhibits no discharge. Left eye exhibits no discharge.  Neck: Neck supple.  Cardiovascular: Normal rate and regular rhythm.   No murmur heard. Pulmonary/Chest: Effort normal. No respiratory distress. He has no wheezes. He has rales. He exhibits no tenderness.  Bibasilar crackles only; normal RA sat 97%; normal speech  Abdominal: Soft. Bowel sounds are normal. He exhibits no distension. There is no tenderness. There is no rebound.  Musculoskeletal: He exhibits no edema and no tenderness.  Baseline ROM, no obvious new focal weakness.  Neurological: He is alert.  Mental status and motor strength appears baseline for patient and situation.  Skin: No rash noted.  Psychiatric: He has a normal mood and affect.    ED Course  Procedures (including critical care time) Patient / Family / Caregiver informed of clinical course, understand medical decision-making process, and agree with plan. Labs Review Labs Reviewed  CBC - Abnormal; Notable for the following:    WBC 10.9 (*)    MCH 24.8 (*)    RDW 17.3 (*)    All  other components within normal limits  PRO B NATRIURETIC PEPTIDE - Abnormal; Notable for the following:    Pro B Natriuretic peptide (BNP) 2106.0 (*)    All other components within normal limits  COMPREHENSIVE METABOLIC PANEL - Abnormal; Notable for the following:    Sodium 133 (*)    Potassium 5.7 (*)    Chloride 95 (*)    Glucose, Bld 103 (*)    BUN 31 (*)    AST 49 (*)    GFR calc non Af Amer 53 (*)    GFR calc Af Amer 62 (*)    Anion gap 17 (*)    All other components within normal limits  BASIC METABOLIC PANEL - Abnormal; Notable for the following:    Glucose, Bld 114 (*)    BUN 33 (*)    GFR calc non Af Wyvonnia Lora  46 (*)    GFR calc Af Amer 53 (*)    All other components within normal limits  TROPONIN I  DIGOXIN LEVEL    Imaging Review Dg Chest 2 View  05/29/2014   CLINICAL DATA:  Shortness of breath.  EXAM: CHEST  2 VIEW  COMPARISON:  05/21/2014.  FINDINGS: The heart is borderline enlarged but stable. The mediastinal and hilar contours are within normal limits and unchanged. There is mild tortuosity and calcification of the thoracic aorta. There is a persistent small left effusion and overlying atelectasis. No infiltrates or edema. No pneumothorax. The bony thorax is intact.  IMPRESSION: Persistent but smaller left pleural effusion with overlying atelectasis.   Electronically Signed   By: Kalman Jewels M.D.   On: 05/29/2014 14:39     EKG Interpretation None      MDM   Final diagnoses:  Pleural effusion on left    I doubt any other EMC precluding discharge at this time including, but not necessarily limited to the following:ACS, SBI.   Babette Relic, MD 05/30/14 [phone]

## 2014-05-29 NOTE — Progress Notes (Signed)
  CARE MANAGEMENT ED NOTE 05/29/2014  Patient:  Bruce Green, Bruce Green   Account Number:  [id number]  Date Initiated:  05/29/2014  Documentation initiated by:  Livia Snellen  Subjective/Objective Assessment:   Patient presents to Ed with     Subjective/Objective Assessment Detail:     Action/Plan:   Action/Plan Detail:   Anticipated DC Date:       Status Recommendation to Physician:   Result of Recommendation:    Other ED Services  Consult [facility] [place]  Other    Choice offered to / List presented to:            Status of service:  Completed, signed off  ED Comments:   ED Comments Detail:  EDCM spoe to patient and his two daughters at bedside. Patient lives at home alone.  Patient has private duty nuring services from [employer] from [place] and patient's daughters are at the patient's house in the morning and from 4pm to 10pm.  Patient's duaghters report that patient was receiving home health PT with [facility] but the patient was cleared by [facility] from a PT point of view.  Patient has a walker, cane, shower chair and bedside commode at home.  Patient does not use the bedside commode at home.  Patient's daughters confirm patient's pcp is Dr. Melinda Crutch of [facility]. Patient's daughters report patient is doing very well at home.  Patient's daughters deny the need for any further home health assistance at this time.  Patient's daughter has the contact information for [facility] if needed.

## 2014-05-30 ENCOUNTER — Other Ambulatory Visit: Payer: Self-pay | Admitting: Surgery

## 2014-05-30 DIAGNOSIS — I4729 Other ventricular tachycardia: Secondary | ICD-10-CM

## 2014-05-30 DIAGNOSIS — I472 Ventricular tachycardia: Secondary | ICD-10-CM

## 2014-05-31 ENCOUNTER — Other Ambulatory Visit: Payer: Self-pay

## 2014-05-31 ENCOUNTER — Ambulatory Visit (INDEPENDENT_AMBULATORY_CARE_PROVIDER_SITE_OTHER): Payer: Self-pay | Admitting: Surgery

## 2014-05-31 ENCOUNTER — Ambulatory Visit
Admission: RE | Admit: 2014-05-31 | Discharge: 2014-05-31 | Disposition: A | Discharge status: Home / Self Care | Payer: Medicare Other | Source: Ambulatory Visit | Attending: Cardiothoracic Surgery | Admitting: Cardiothoracic Surgery

## 2014-05-31 ENCOUNTER — Encounter: Payer: Self-pay | Admitting: Surgery

## 2014-05-31 VITALS — BP 97/53 | HR 88 | Resp 16 | Ht 70.0 in | Wt 150.0 lb

## 2014-05-31 DIAGNOSIS — I472 Ventricular tachycardia: Secondary | ICD-10-CM

## 2014-05-31 DIAGNOSIS — I4729 Other ventricular tachycardia: Secondary | ICD-10-CM

## 2014-05-31 DIAGNOSIS — I429 Cardiomyopathy, unspecified: Secondary | ICD-10-CM

## 2014-05-31 DIAGNOSIS — I2510 Atherosclerotic heart disease of native coronary artery without angina pectoris: Secondary | ICD-10-CM

## 2014-05-31 DIAGNOSIS — Z951 Presence of aortocoronary bypass graft: Secondary | ICD-10-CM

## 2014-05-31 DIAGNOSIS — I2584 Coronary atherosclerosis due to calcified coronary lesion: Secondary | ICD-10-CM

## 2014-05-31 NOTE — Progress Notes (Signed)
HPI: Patient returns for routine postoperative follow-up having undergone CABG x 3  on 04/24/2014. The patient's early postoperative recovery while in the hospital was notable for an uncomplicated postop course. Since hospital discharge the patient reports that he has had persistent shortness of breath that is worse with lying down. He has not been able to sleep much due to this. He was seen in the ER twice for shortness of breath, the last visit being 2 days ago. At that visit his troponin was negative but his BNP was 2106. His Hgb was 13.1. His K+ was 5.7 but his creat was 1.15. His albumin was 3.6 and LFT's unremarkable. Digoxin level was 0.9. He had a CT pulmonary angio on 05/21/2014 at his first visit to the ER and this showed no PE. It showed no significant pericardial effusion and a moderate left pleural effusion with LLL atelectasis. His CXR two days ago showed persistence of the moderate left effusion and his lasix was increased from 20 mg to 40 mg daily. His follow up  CXR today shows resolution of the left pleural effusion. His daughter is concerned that he may be getting too dry because he was very dizzy with standing yesterday and has had no edema. He denies any chest pain.   Current Outpatient Prescriptions  Medication Sig Dispense Refill  . acetaminophen (TYLENOL) 325 MG tablet Take 650 mg by mouth every 4 (four) hours as needed for mild pain or moderate pain.      Marland Kitchen aspirin EC 325 MG tablet Take 325 mg by mouth every morning.      Marland Kitchen atorvastatin (LIPITOR) 80 MG tablet Take 80 mg by mouth every evening.      . B Complex-C-Calcium (B-COMPLEX/VITAMIN C PO) Take 1 tablet by mouth every morning.       . calcium carbonate (OS-CAL) 600 MG TABS tablet Take 600 mg by mouth every morning.      . carvedilol (COREG) 6.25 MG tablet Take 6.25 mg by mouth 2 (two) times daily with a meal.      . dextromethorphan-guaiFENesin (MUCINEX DM) 30-600 MG per 12 hr tablet Take 1 tablet by mouth 2 (two)  times daily as needed for cough.      . digoxin (LANOXIN) 0.125 MG tablet Take 0.0625-0.125 mg by mouth daily. Take 0.5 tablet (0.0625 mg total) by mouth every other day, alternating with 1 tablet by mouth.      . docusate sodium (COLACE) 100 MG capsule Take 100 mg by mouth daily.       . ferrous sulfate 325 (65 FE) MG tablet Take 1 tablet (325 mg total) by mouth daily with breakfast. For one month then stop.    3  . folic acid (FOLVITE) 1 MG tablet Take 1 tablet (1 mg total) by mouth daily. For one month then stop.  30 tablet  0  . furosemide (LASIX) 40 MG tablet Take 1 tablet (40 mg total) by mouth daily.  30 tablet  3  . lisinopril (PRINIVIL,ZESTRIL) 2.5 MG tablet Take 2.5 mg by mouth every morning.      . pantoprazole (PROTONIX) 40 MG tablet Take 40 mg by mouth every morning.      . polycarbophil (FIBERCON) 625 MG tablet Take 625 mg by mouth every morning.       . polyethylene glycol (MIRALAX / GLYCOLAX) packet Take 17 g by mouth daily as needed for moderate constipation.       . potassium chloride SA (  K-DUR,KLOR-CON) 20 MEQ tablet Take 20 mEq by mouth every morning.      . Vitamin D, Ergocalciferol, (DRISDOL) 50000 UNITS CAPS capsule Take 50,000 Units by mouth every Saturday.       No current facility-administered medications for this visit.    Physical Exam: BP 97/53  Pulse 88  Resp 16  Ht 5\' 10"  (1.778 m)  Wt 150 lb (68.04 kg)  BMI 21.52 kg/m2  SpO2 95% He looks tired but in no distress Lung exam is clear but breath sounds are diminished throughout Cardiac exam shows a regular rate and rhythm with normal heart sounds. Chest incision is healing well and sternum is stable. The leg incisions are healing well and there is no peripheral edema.    Diagnostic Tests:  CLINICAL DATA: CABG on 04/24/2014, some shortness of breath and  weakness  EXAM:  CHEST 2 VIEW  COMPARISON: Chest x-ray of 05/29/2014 and CT chest of 05/21/2014  FINDINGS:  Aeration of the left lung base has  improved. There is still a small  left pleural effusion present. There is a nodular opacity overlying  the right mid lung which is not seen previously and may be  artifactual. Recommend attention to this area on followup chest  x-ray. No abnormality of the lung is seen on recent CT of the chest.  Mediastinal hilar contours are unchanged. Cardiomegaly is stable. No  bony abnormality is seen.  IMPRESSION:  1. Small left pleural effusion remains. Aeration of the left lung  base has improved.  2. A small nodular opacity overlies the right mid lung of  questionable significance with no abnormality noted on recent CT of  the chest. Recommend attention to this area on followup chest x-ray.  Electronically Signed  By: Ivar Drape M.D.  On: 05/31/2014 12:24    Impression:  He has persistent shortness of breath and orthopnea that is likely multifactorial. His preop EF was 25-30% and his BNP was 2100 on his visit to the ER two days ago. There was no pericardial effusion on his CTA a week ago so I doubt that has changed. His EF may not have improved with revascularization. He also had a severe obstructive defect on his preop PFT's with an FEV1 of 57% of predicted. His oxygen sats have been fine on room air. His CXR today looks much better and I don't think there is enough effusion or atelectasis remaining to cause shortness of breath. I think he is also generally weak due to surgery at his age. I think it would be worth repeating his echo to check LV function. He is on Coreg and lasix for his CHF. His BP is on the low side and he has had recent orthostatic symptoms at home with no peripheral edema at all so I told him to decrease the lasix back to 20 mg daily. He would also benefit from seeing a pulmonologist with his degree of COPD to see if any medications may improve his pulmonary function and symptoms.   Plan:  1. Will schedule 2 D echo.  2. Schedule appt with pulmonary medicine.  3. He has  followup with Dr. Sallyanne Kuster but is not sure of how far off that is.  4. I will see him back after the echo has been done.

## 2014-06-01 ENCOUNTER — Telehealth: Payer: Self-pay | Admitting: Cardiovascular Disease

## 2014-06-01 NOTE — Telephone Encounter (Signed)
Closed encounter °

## 2014-06-02 ENCOUNTER — Ambulatory Visit (HOSPITAL_COMMUNITY)
Admission: RE | Admit: 2014-06-02 | Discharge: 2014-06-02 | Disposition: A | Discharge status: Home / Self Care | Payer: Medicare Other | Source: Ambulatory Visit | Attending: Surgery | Admitting: Surgery

## 2014-06-02 DIAGNOSIS — I517 Cardiomegaly: Secondary | ICD-10-CM

## 2014-06-02 DIAGNOSIS — R011 Cardiac murmur, unspecified: Secondary | ICD-10-CM | POA: Insufficient documentation

## 2014-06-02 DIAGNOSIS — K219 Gastro-esophageal reflux disease without esophagitis: Secondary | ICD-10-CM | POA: Insufficient documentation

## 2014-06-02 DIAGNOSIS — I359 Nonrheumatic aortic valve disorder, unspecified: Secondary | ICD-10-CM | POA: Insufficient documentation

## 2014-06-02 DIAGNOSIS — I255 Ischemic cardiomyopathy: Secondary | ICD-10-CM

## 2014-06-02 DIAGNOSIS — I079 Rheumatic tricuspid valve disease, unspecified: Secondary | ICD-10-CM | POA: Insufficient documentation

## 2014-06-02 DIAGNOSIS — I2510 Atherosclerotic heart disease of native coronary artery without angina pectoris: Secondary | ICD-10-CM

## 2014-06-02 DIAGNOSIS — I429 Cardiomyopathy, unspecified: Secondary | ICD-10-CM

## 2014-06-02 DIAGNOSIS — R42 Dizziness and giddiness: Secondary | ICD-10-CM | POA: Insufficient documentation

## 2014-06-02 DIAGNOSIS — I509 Heart failure, unspecified: Secondary | ICD-10-CM | POA: Insufficient documentation

## 2014-06-06 ENCOUNTER — Encounter: Payer: Self-pay | Admitting: Internal Medicine

## 2014-06-06 ENCOUNTER — Ambulatory Visit (INDEPENDENT_AMBULATORY_CARE_PROVIDER_SITE_OTHER): Payer: Medicare Other | Admitting: Internal Medicine

## 2014-06-06 VITALS — BP 102/60 | HR 82 | Temp 97.5°F | Ht 70.0 in | Wt 148.0 lb

## 2014-06-06 DIAGNOSIS — R0602 Shortness of breath: Secondary | ICD-10-CM

## 2014-06-06 DIAGNOSIS — R0989 Other specified symptoms and signs involving the circulatory and respiratory systems: Secondary | ICD-10-CM

## 2014-06-06 DIAGNOSIS — R0609 Other forms of dyspnea: Secondary | ICD-10-CM

## 2014-06-06 NOTE — Progress Notes (Signed)
Subjective:    Patient ID: Bruce Green, male    DOB: 11/04/1919 MRN: [id number]  HPI  43 yowm never smoker with doe starting spring 2015 felt to be related to IHD s/p CABG 01/30/24 complicated by large L effusion which had mostly resolved by  05/31/14 f/u with Dr Bruce Green who referred him to pulmonary 06/05/14 to eval sob in setting of abn preop pfts dne 04/21/14 ? Representing copd    06/06/2014 1st [facility] office visit/ Bruce Green  Chief Complaint  Patient presents with  . Pulmonary Consult    Referred per Dr. Cyndia Green for eval of COPD. Pt states he was dxed with COPD on 05/31/14.  Pt c/o SOB for the past 3 months.  He states that he started noticing SOB while working in his garden and having to stop and rest after approx 15 min.  He also c/o cough- occ prod with minimal grey sputum.   prior to spring 2015 baseline = walk a mile s difficulty  Grad progressive doe x 100 ft   Orthopnea / 2 pillows  Assoc with dry cough and hoarseness on acei  No obvious   day to day or daytime variabilty or assoc  cp or chest tightness, subjective wheeze or overt sinus   symptoms. No unusual exp hx or h/o childhood pna/ asthma or knowledge of premature birth.   Also denies any obvious fluctuation of symptoms with weather or environmental changes or other aggravating or alleviating factors except as outlined above   Current Medications, Allergies, Complete Past Medical History, Past Surgical History, Family History, and Social History were reviewed in [employer] record.             Review of Systems  Constitutional: Positive for appetite change and unexpected weight change. Negative for fever, chills and activity change.  HENT: Negative for congestion, dental problem, postnasal drip, rhinorrhea, sneezing, sore throat, trouble swallowing and voice change.   Eyes: Negative for visual disturbance.  Respiratory: Positive for cough and shortness of breath. Negative for choking.     Cardiovascular: Negative for chest pain and leg swelling.  Gastrointestinal: Negative for nausea, vomiting and abdominal pain.  Genitourinary: Negative for difficulty urinating.       Acid heartburn   Musculoskeletal: Negative for arthralgias.  Skin: Negative for rash.  Psychiatric/Behavioral: Negative for behavioral problems and confusion.       Objective:   Physical Exam  Elderly hoarse wm nad with min pseudowheeze  Wt Readings from Last 3 Encounters:  06/06/14 148 lb (67.132 kg)  05/31/14 150 lb (68.04 kg)  05/24/14 151 lb 11.2 oz (68.811 kg)      HEENT: nl dentition, turbinates, and orophanx. Nl external ear canals without cough reflex   NECK :  without JVD/Nodes/TM/ nl carotid upstrokes bilaterally   LUNGS: no acc muscle use, clear to A and P bilaterally without cough on insp or exp maneuvers   CV:  RRR  no s3 or murmur or increase in P2, no edema   ABD:  soft and nontender with nl excursion in the supine position. No bruits or organomegaly, bowel sounds nl  MS:  warm without deformities, calf tenderness, cyanosis or clubbing  SKIN: warm and dry without lesions    NEURO:  alert, approp, no deficits   05/21/14 CTa no emphysema, no pe, mod L effusion with atx  cxr 05/30/14  1. Small left pleural effusion remains. Aeration of the left lung  base has improved.  2. A  small nodular opacity overlies the right mid lung of  questionable significance with no abnormality noted on recent CT of  the chest   Lab Results  Component Value Date   PROBNP 2106.0* 05/29/2014    Lab Results  Component Value Date   HGB 13.1 05/29/2014   HGB 12.3* 05/21/2014   HGB 11.9* 05/17/2014      Chemistry      Component Value Date/Time   NA 138 05/29/2014 1729   K 4.7 05/29/2014 1729   CL 98 05/29/2014 1729   CO2 27 05/29/2014 1729   BUN 33* 05/29/2014 1729   CREATININE 1.30 05/29/2014 1729   CREATININE 0.95 05/17/2014 1015      Component Value Date/Time   CALCIUM 9.2 05/29/2014 1729   ALKPHOS  87 05/29/2014 1448   AST 49* 05/29/2014 1448   ALT 14 05/29/2014 1448   BILITOT 0.5 05/29/2014 1448      Lab Results  Component Value Date   TSH 1.319 04/18/2014         Assessment & Plan:

## 2014-06-06 NOTE — Patient Instructions (Addendum)
You will need a trial off of the lisinopril and the coreg  For a least several weeks until we can sort out your problem but I do not believe you have significant copd (my recommendation is for diovan and bisoprolol but they need to be approved by Dr C)  Protonix 40 mg Take 30-60 min before first meal of the day and Pepcid ac 20 mg at bedtime  GERD (REFLUX)  is an extremely common cause of respiratory symptoms, many times with no significant heartburn at all.    It can be treated with medication, but also with lifestyle changes including avoidance of late meals, excessive alcohol, smoking cessation, and avoid fatty foods, chocolate, peppermint, colas, red wine, and acidic juices such as orange juice.  NO MINT OR MENTHOL PRODUCTS SO NO COUGH DROPS  USE SUGARLESS CANDY INSTEAD (jolley ranchers or Stover's)  NO OIL BASED VITAMINS - use powdered substitutes.    Please schedule a follow up office visit in 2 weeks, sooner if needed

## 2014-06-06 NOTE — Assessment & Plan Note (Addendum)
Symptoms are markedly disproportionate to objective findings and not clear this is a lung problem but pt does appear to have difficult airway management issues. DDX of  difficult airways management all start with A and  include Adherence, Ace Inhibitors, Acid Reflux, Active Sinus Disease, Alpha 1 Antitripsin deficiency, Anxiety masquerading as Airways dz,  ABPA,  allergy(esp in young), Aspiration (esp in elderly), Adverse effects of DPI,  Active smokers, plus two Bs  = Bronchiectasis and Beta blocker use..and one C= CHF  ACEi at top of short list of suspects > rec trial of arb instead x 6 weeks min  ? Acid (or non-acid) GERD > always difficult to exclude as up to 75% of pts in some series report no assoc GI/ Heartburn symptoms> rec max (24h)  acid suppression and diet restrictions/ reviewed and instructions given in writing.   ? Asthma/ allergies > very unlikely onset at age 80 - he does have a reduction in airflow preop ? Cardiac asthma related ? Med related   ? BB effect > Strongly prefer in this setting: Bystolic, the most beta -1  selective Beta blocker available in sample form, with bisoprolol the most selective generic choice  on the market.   ? chf > probably optimally rx'd at this point   - would like to see him back for pfts before and after bronchodilators off coreg and acei before committing him to additional meds as he has no evidence for asthma, bronchitis or copd on today's eval   See instructions for specific recommendations which were reviewed directly with the patient who was given a copy with highlighter outlining the key components

## 2014-06-07 ENCOUNTER — Ambulatory Visit (INDEPENDENT_AMBULATORY_CARE_PROVIDER_SITE_OTHER): Payer: Self-pay | Admitting: Surgery

## 2014-06-07 ENCOUNTER — Encounter: Payer: Self-pay | Admitting: Surgery

## 2014-06-07 VITALS — BP 108/64 | HR 80 | Resp 16 | Ht 70.0 in | Wt 150.0 lb

## 2014-06-07 DIAGNOSIS — I2510 Atherosclerotic heart disease of native coronary artery without angina pectoris: Secondary | ICD-10-CM

## 2014-06-07 DIAGNOSIS — I2584 Coronary atherosclerosis due to calcified coronary lesion: Secondary | ICD-10-CM

## 2014-06-07 DIAGNOSIS — R0602 Shortness of breath: Secondary | ICD-10-CM

## 2014-06-07 DIAGNOSIS — Z951 Presence of aortocoronary bypass graft: Secondary | ICD-10-CM

## 2014-06-07 NOTE — Progress Notes (Signed)
HPI:  Bruce Green returns for follow up after CABG x 3 on 04/24/2014. I saw him last week and Bruce Green was not feeling well with persistent shortness of breath that was worse with lying flat and keeping him from sleeping. Bruce Green was seen in the ER twice for shortness of breath, the last visit being 2 days ago. At that visit his troponin was negative but his BNP was 2106. His Hgb was 13.1. His K+ was 5.7 but his creat was 1.15. His albumin was 3.6 and LFT's unremarkable. Digoxin level was 0.9. Bruce Green had a CT pulmonary angio on 05/21/2014 at his first visit to the ER and this showed no PE. It showed no significant pericardial effusion and a moderate left pleural effusion with LLL atelectasis. His CXR on 05/29/2014 showed persistence of the moderate left effusion and his lasix was increased from 20 mg to 40 mg daily. His follow up CXR 2 days later showed resolution of the left pleural effusion. His daughter was concerned last week that Bruce Green may be getting too dry because Bruce Green was very dizzy with standing and had no edema. Bruce Green denies any chest pain. I had a repeat 2D echo done on 06/02/2014 which showed an improvement in his EF from 25% preop to 37%. There was diastolic dysfunction with elevated filling pressure. Bruce Green says Bruce Green feels much better than last week. Bruce Green is not coughing as much and is sleeping better. His breathing is better. Bruce Green still has some dizziness when Bruce Green gets up.    Current Outpatient Prescriptions  Medication Sig Dispense Refill  . acetaminophen (TYLENOL) 325 MG tablet Take 650 mg by mouth every 4 (four) hours as needed for mild pain or moderate pain.      Marland Kitchen aspirin EC 325 MG tablet Take 325 mg by mouth every morning.      Marland Kitchen atorvastatin (LIPITOR) 80 MG tablet Take 80 mg by mouth every evening.      . B Complex-C-Calcium (B-COMPLEX/VITAMIN C PO) Take 1 tablet by mouth every morning.       . calcium carbonate (OS-CAL) 600 MG TABS tablet Take 600 mg by mouth every morning.      Marland Kitchen dextromethorphan-guaiFENesin  (MUCINEX DM) 30-600 MG per 12 hr tablet Take 1 tablet by mouth 2 (two) times daily as needed for cough.      . digoxin (LANOXIN) 0.125 MG tablet Take 0.0625-0.125 mg by mouth daily. Take 0.5 tablet (0.0625 mg total) by mouth every other day, alternating with 1 tablet by mouth.      . folic acid (FOLVITE) 1 MG tablet Take 1 tablet (1 mg total) by mouth daily. For one month then stop.  30 tablet  0  . furosemide (LASIX) 40 MG tablet Take 20 mg by mouth daily.      . pantoprazole (PROTONIX) 40 MG tablet Take 40 mg by mouth every morning.      . polycarbophil (FIBERCON) 625 MG tablet Take 625 mg by mouth every morning.       . polyethylene glycol (MIRALAX / GLYCOLAX) packet Take 17 g by mouth daily as needed for moderate constipation.       . potassium chloride SA (K-DUR,KLOR-CON) 20 MEQ tablet Take 20 mEq by mouth every morning.      . Vitamin D, Ergocalciferol, (DRISDOL) 50000 UNITS CAPS capsule Take 50,000 Units by mouth every Saturday.       No current facility-administered medications for this visit.     Physical Exam: BP 108/64  Pulse 80  Resp 16  Ht 5\' 10"  (1.778 m)  Wt 150 lb (68.04 kg)  BMI 21.52 kg/m2  SpO2 96%  Bruce Green looks brighter and in no distress  Lung exam is clear but breath sounds are diminished throughout  Cardiac exam shows a regular rate and rhythm with normal heart sounds.  Chest incision is healing well and sternum is stable.  The leg incisions are healing well and there is no peripheral edema.   Diagnostic Tests: None  Impression:  Overall Bruce Green seems to be making progress in his recovery. Bruce Green looks better than last week. There was some concern about whether lisinopril was contributing to his shortness of breath. His shortness of breath has resolved and his cough has improved some. Of more concern to me is his dizziness and weakness when Bruce Green is up and I think Bruce Green is probably dry and orthostatic. His weight has decreased 12 lbs below his preop wt. I don't think Bruce Green needs  further diuresis and probably needs to stop the lisinopril.  Plan:  Bruce Green has an appt tomorrow with Dr. Sallyanne Kuster and his daughters will discuss stopping his diuretic and lisinopril. I think it is best if one physician is managing his medications.  I encouraged him to continue ambulating as much as possible and to participate in cardiac rehab. Bruce Green will follow up with Dr. Sallyanne Kuster and will return to see me if Bruce Green has any problems with his incisions.

## 2014-06-08 ENCOUNTER — Encounter: Payer: Self-pay | Admitting: Cardiovascular Disease

## 2014-06-08 ENCOUNTER — Ambulatory Visit (INDEPENDENT_AMBULATORY_CARE_PROVIDER_SITE_OTHER): Payer: Medicare Other | Admitting: Cardiovascular Disease

## 2014-06-08 ENCOUNTER — Encounter (HOSPITAL_COMMUNITY)
Admission: RE | Admit: 2014-06-08 | Discharge: 2014-06-08 | Disposition: A | Discharge status: Home / Self Care | Payer: Medicare Other | Source: Ambulatory Visit | Attending: Cardiovascular Disease | Admitting: Cardiovascular Disease

## 2014-06-08 VITALS — BP 72/46 | HR 76 | Ht 69.0 in | Wt 148.1 lb

## 2014-06-08 DIAGNOSIS — I2510 Atherosclerotic heart disease of native coronary artery without angina pectoris: Secondary | ICD-10-CM

## 2014-06-08 DIAGNOSIS — I209 Angina pectoris, unspecified: Secondary | ICD-10-CM

## 2014-06-08 DIAGNOSIS — I2589 Other forms of chronic ischemic heart disease: Secondary | ICD-10-CM

## 2014-06-08 DIAGNOSIS — I472 Ventricular tachycardia, unspecified: Secondary | ICD-10-CM

## 2014-06-08 DIAGNOSIS — Z951 Presence of aortocoronary bypass graft: Secondary | ICD-10-CM

## 2014-06-08 DIAGNOSIS — R5383 Other fatigue: Secondary | ICD-10-CM

## 2014-06-08 DIAGNOSIS — I5020 Unspecified systolic (congestive) heart failure: Secondary | ICD-10-CM

## 2014-06-08 DIAGNOSIS — I951 Orthostatic hypotension: Secondary | ICD-10-CM | POA: Insufficient documentation

## 2014-06-08 DIAGNOSIS — I255 Ischemic cardiomyopathy: Secondary | ICD-10-CM

## 2014-06-08 DIAGNOSIS — I509 Heart failure, unspecified: Secondary | ICD-10-CM

## 2014-06-08 DIAGNOSIS — I25119 Atherosclerotic heart disease of native coronary artery with unspecified angina pectoris: Secondary | ICD-10-CM

## 2014-06-08 DIAGNOSIS — I4729 Other ventricular tachycardia: Secondary | ICD-10-CM

## 2014-06-08 DIAGNOSIS — Z79899 Other long term (current) drug therapy: Secondary | ICD-10-CM

## 2014-06-08 DIAGNOSIS — R5381 Other malaise: Secondary | ICD-10-CM

## 2014-06-08 LAB — CBC
HCT: 40.6 % (ref 39.0–52.0)
HEMOGLOBIN: 13 g/dL (ref 13.0–17.0)
MCH: 25 pg — ABNORMAL LOW (ref 26.0–34.0)
MCHC: 32 g/dL (ref 30.0–36.0)
MCV: 78.1 fL (ref 78.0–100.0)
Platelets: 280 10*3/uL (ref 150–400)
RBC: 5.2 MIL/uL (ref 4.22–5.81)
RDW: 18.2 % — AB (ref 11.5–15.5)
WBC: 9.4 10*3/uL (ref 4.0–10.5)

## 2014-06-08 MED ORDER — CARVEDILOL 3.125 MG PO TABS: 3.1250 mg | ORAL_TABLET | Freq: Two times a day (BID) | ORAL | Status: DC

## 2014-06-08 NOTE — Progress Notes (Signed)
Patient ID: Bruce Green, male   DOB: [birth year], 80 y.o.   MRN: [id number]     Reason for office visit Weakness, dizziness  It has been roughly 6 weeks since Bruce Green underwent elective coronary bypass surgery for multivessel CAD and moderate to severe ischemic cardiomyopathy (EF 25-30%). Despite his advanced age, he had a remarkably uncomplicated postoperative course. He had brief problems with atrial tachycardia/atrial fibrillation. He was discharged on June 7, On June 28 he presented to the emergency room with signs and symptoms of congestive heart failure, despite the fact that he had been started on a low dose of diuretic just 4 days earlier in the office. A chest CT showed no evidence of pulmonary embolism but did show a moderate left pleural effusion. He improved after treatment with diuretics and a more recent chest x-ray shows reduction in the size of his pleural effusion.   A followup left ventricular ejection fraction by echocardiography was 37% on July 10th.  He is no longer short of breath. He continues to have a nagging cough. He recently saw Dr. Melvyn Novas, who recommended discontinuing carvedilol and lisinopril and switching to a more selective beta blocker and angiotensin receptor blocker instead.  While he was at cardiac rehabilitation for orientation he felt extremely weak, his legs were very wobbly and he had symptoms of presyncope. Today in the office he has evidence of profound orthostatic hypotension. His weight is 148 pounds which is 7 pounds less than when his diuretics were started on June 24 and 12 pounds less than his preoperative weight.    Allergies  Allergen Reactions  . Sulfa Antibiotics Rash    Current Outpatient Prescriptions  Medication Sig Dispense Refill  . acetaminophen (TYLENOL) 325 MG tablet Take 650 mg by mouth every 4 (four) hours as needed for mild pain or moderate pain.      Marland Kitchen aspirin EC 325 MG tablet Take 325 mg by mouth every morning.      Marland Kitchen  atorvastatin (LIPITOR) 80 MG tablet Take 80 mg by mouth every evening.      . B Complex-C-Calcium (B-COMPLEX/VITAMIN C PO) Take 1 tablet by mouth every morning.       . calcium carbonate (OS-CAL) 600 MG TABS tablet Take 600 mg by mouth every morning.      Marland Kitchen dextromethorphan-guaiFENesin (MUCINEX DM) 30-600 MG per 12 hr tablet Take 1 tablet by mouth 2 (two) times daily as needed for cough.      . digoxin (LANOXIN) 0.125 MG tablet Take 0.0625-0.125 mg by mouth daily. Take 0.5 tablet (0.0625 mg total) by mouth every other day, alternating with 1 tablet by mouth.      . folic acid (FOLVITE) 1 MG tablet Take 1 tablet (1 mg total) by mouth daily. For one month then stop.  30 tablet  0  . pantoprazole (PROTONIX) 40 MG tablet Take 40 mg by mouth every morning.      . polycarbophil (FIBERCON) 625 MG tablet Take 625 mg by mouth every morning.       . polyethylene glycol (MIRALAX / GLYCOLAX) packet Take 17 g by mouth daily as needed for moderate constipation.       . Vitamin D, Ergocalciferol, (DRISDOL) 50000 UNITS CAPS capsule Take 50,000 Units by mouth every Saturday.      . carvedilol (COREG) 3.125 MG tablet Take 1 tablet (3.125 mg total) by mouth 2 (two) times daily.  180 tablet  3   No current facility-administered medications for this visit.  Past Medical History  Diagnosis Date  . Stricture esophagus     distal  . GERD (gastroesophageal reflux disease)   . Hiatal hernia     6cm  . Coronary artery disease 04/21/2014      severe triple vessel  . Ischemic cardiomyopathy 03/2014    EF 25-30%    Past Surgical History  Procedure Laterality Date  . Cholecystectomy    . Shoulder surgery    . Appendectomy    . Esophagogastroduodenoscopy  04/09/2012    Procedure: ESOPHAGOGASTRODUODENOSCOPY (EGD);  Surgeon: Gatha Mayer, MD;  Location: [facility];  Service: Endoscopy;  Laterality: N/A;  . Coronary artery bypass graft N/A 04/24/2014    Procedure: CORONARY ARTERY BYPASS GRAFTING (CABG);  Surgeon:  Gaye Pollack, MD;  Location: [facility];  Service: Open Heart Surgery;  Laterality: N/A;  Times 3 using left internal mammary artery and endoscopically harvested right saphenous vein  . Intraoperative transesophageal echocardiogram N/A 04/24/2014    Procedure: INTRAOPERATIVE TRANSESOPHAGEAL ECHOCARDIOGRAM;  Surgeon: Gaye Pollack, MD;  Location: [facility];  Service: Open Heart Surgery;  Laterality: N/A;    Family History  Problem Relation Age of Onset  . Heart disease Mother   . Kidney disease Father     History   Social History  . Marital Status: Widowed    Spouse Name: N/A    Number of Children: N/A  . Years of Education: N/A   Occupational History  . retired    Social History Main Topics  . Smoking status: Never Smoker   . Smokeless tobacco: Never Used  . Alcohol Use: 0.5 oz/week    1 drink(s) per week     Comment: rare  . Drug Use: No  . Sexual Activity: Not on file   Other Topics Concern  . Not on file   Social History Narrative   Retired Conservation officer, historic buildings. Widowed.          Review of systems: Dizziness and weakness, presyncope, denies dyspnea or angina at rest or with usual exertion, orthopnea, paroxysmal nocturnal dyspnea, syncope, palpitations, focal neurological deficits, intermittent claudication, lower extremity edema, unexplained weight gain, hemoptysis or wheezing.  The patient also denies abdominal pain, nausea, vomiting, dysphagia, diarrhea, constipation, polyuria, polydipsia, dysuria, hematuria, frequency, urgency, abnormal bleeding or bruising, fever, chills, unexpected weight changes, mood swings, change in skin or hair texture, change in voice quality, auditory or visual problems, allergic reactions or rashes, new musculoskeletal complaints other than mild postsurgical pain.    PHYSICAL EXAM Supine blood pressure 129/76 mm Hg heart rate 79 Sitting  blood pressure 107/66 mm Hg heart rate 78 Standing: BP 72/46  Pulse 76  Ht 5\' 9"  (1.753 m)  Wt 148 lb 2.4 oz  (67.2 kg)  BMI 21.87 kg/m2  SpO2 92%  General: Alert, oriented x3, no distress. He does not appear pale Head: no evidence of trauma, PERRL, EOMI, no exophtalmos or lid lag, no myxedema, no xanthelasma; normal ears, nose and oropharynx Neck: flat jugular venous pulsations and no hepatojugular reflux; brisk carotid pulses without delay and no carotid bruits Chest: clear to auscultation, no signs of consolidation by percussion or palpation, normal fremitus, symmetrical and full respiratory excursions. Rapidly healing sternotomy scar. Cardiovascular: normal position and quality of the apical impulse, regular rhythm, normal first and second heart sounds, no murmurs, rubs or gallops Abdomen: no tenderness or distention, no masses by palpation, no abnormal pulsatility or arterial bruits, normal bowel sounds, no hepatosplenomegaly Extremities: no clubbing, cyanosis or edema; 2+ radial,  ulnar and brachial pulses bilaterally; 2+ right femoral, posterior tibial and dorsalis pedis pulses; 2+ left femoral, posterior tibial and dorsalis pedis pulses; no subclavian or femoral bruits Neurological: grossly nonfocal   EKG: Normal sinus rhythm, minor IVCD with a QRS duration of 116 ms, no repolarization abnormalities, QT 404 ms  BMET    Component Value Date/Time   NA 138 05/29/2014 1729   K 4.7 05/29/2014 1729   CL 98 05/29/2014 1729   CO2 27 05/29/2014 1729   GLUCOSE 114* 05/29/2014 1729   BUN 33* 05/29/2014 1729   CREATININE 1.30 05/29/2014 1729   CREATININE 0.95 05/17/2014 1015   CALCIUM 9.2 05/29/2014 1729   GFRNONAA 46* 05/29/2014 1729   GFRNONAA 69 05/17/2014 1015   GFRAA 53* 05/29/2014 1729   GFRAA 80 05/17/2014 1015     ASSESSMENT AND PLAN  Mr. Parisi appears to be profoundly hypovolemic. We'll discontinue his diuretic. We'll also stop his ACE inhibitor and cut the carvedilol dose in half for now. He is in sinus rhythm and I'm concerned that his renal function may have deteriorated. We'll stop the digoxin and  potassium supplements.  Recheck labs. Followup in a few days for a repeat blood pressure check. We'll keep a close eye on him for the next few weeks. In fact,if he feels better I urged him to participate in cardiac rehabilitation, where we can get frequent reassessments of heart rate, blood pressure and weight. I think it is still premature to reassess his LV systolic function:  left ventricular EF has shown a significant response already, but can be expected to continue improvement over the next couple of months..  Patient Instructions  STOP:  Furosemide, Potassium,Lisinopril and Digoxin.  DECREASE:  Carvedilol to 3.125mg  twice a day.  Your physician recommends that you return for lab work in: Today at [facility].  Your physician recommends that you schedule a follow-up appointment in: Monday for BP check with Erasmo Downer or nurses schedule.  Dr. Sallyanne Kuster recommends that you schedule a follow-up appointment in: 2-3 weeks.         Orders Placed This Encounter  Procedures  . Basic metabolic panel  . CBC  . EKG 12-Lead   Meds ordered this encounter  Medications  . DISCONTD: lisinopril (PRINIVIL,ZESTRIL) 2.5 MG tablet    Sig: Take 2.5 mg by mouth daily.  Marland Kitchen DISCONTD: carvedilol (COREG) 6.25 MG tablet    Sig: Take 6.25 mg by mouth 2 (two) times daily.  . carvedilol (COREG) 3.125 MG tablet    Sig: Take 1 tablet (3.125 mg total) by mouth 2 (two) times daily.    Dispense:  180 tablet    Refill:  Olin Hampton Cost, MD, [facility] [phone] office [phone] pager

## 2014-06-08 NOTE — Progress Notes (Signed)
Cardiac Rehab Medication Review by a Pharmacist  Does the patient  feel that his/her medications are working for him/her?  Yes-but patient and daughter believe medications are causing many side effects  Has the patient been experiencing any side effects to the medications prescribed?  yes  Does the patient measure his/her own blood pressure or blood glucose at home? n/a   Does the patient have any problems obtaining medications due to transportation or finances?   no  Understanding of regimen: excellent Understanding of indications: good Potential of compliance: excellent    Pharmacist comments: Patient and daughter are very concerned about patient's new medication regimen. States he is very concerned about all of a sudden taking 15 medications. Patient states he has had trouble sleeping and has been very restless since being put on these medications. Also states he wakes up having to go to the bathroom often. Daughter states last night patient experienced shortness of breath and coughs.  Patient is very concerned and feels his doctors are in disagreement on his mediation regimen.   Elicia Lamp P 06/08/2014 8:46 AM

## 2014-06-08 NOTE — Patient Instructions (Addendum)
STOP:  Furosemide, Potassium,Lisinopril and Digoxin.  DECREASE:  Carvedilol to 3.125mg  twice a day.  Your physician recommends that you return for lab work in: Today at [facility].  Your physician recommends that you schedule a follow-up appointment in: Monday for BP check with Erasmo Downer or nurses schedule.  Dr. Sallyanne Kuster recommends that you schedule a follow-up appointment in: 2-3 weeks.

## 2014-06-09 LAB — BASIC METABOLIC PANEL
BUN: 34 mg/dL — ABNORMAL HIGH (ref 6–23)
CALCIUM: 8.8 mg/dL (ref 8.4–10.5)
CO2: 26 meq/L (ref 19–32)
Chloride: 101 mEq/L (ref 96–112)
Creat: 1.3 mg/dL (ref 0.50–1.35)
GLUCOSE: 93 mg/dL (ref 70–99)
Potassium: 4.9 mEq/L (ref 3.5–5.3)
Sodium: 139 mEq/L (ref 135–145)

## 2014-06-12 ENCOUNTER — Ambulatory Visit (INDEPENDENT_AMBULATORY_CARE_PROVIDER_SITE_OTHER): Payer: Medicare Other | Admitting: Pharmacist Clinician (PhC)/ Clinical Pharmacy Specialist

## 2014-06-12 ENCOUNTER — Telehealth (HOSPITAL_COMMUNITY): Payer: Self-pay | Admitting: Family Medicine

## 2014-06-12 ENCOUNTER — Encounter (HOSPITAL_COMMUNITY): Payer: Medicare Other

## 2014-06-12 VITALS — BP 120/58 | Ht 69.0 in | Wt 149.9 lb

## 2014-06-12 DIAGNOSIS — I951 Orthostatic hypotension: Secondary | ICD-10-CM

## 2014-06-12 NOTE — Patient Instructions (Signed)
Your blood pressure today is 120/58  Check your blood pressure at home from time to time, especially if you are feeling lightheaded or dizzy  Take your BP meds as follows: carvedilol 3.125mg  twice daily   HOW TO TAKE YOUR BLOOD PRESSURE:   Rest 5 minutes before taking your blood pressure.    Don't smoke or drink caffeinated beverages for at least 30 minutes before.   Take your blood pressure before (not after) you eat.   Sit comfortably with your back supported and both feet on the floor (don't cross your legs).   Elevate your arm to heart level on a table or a desk.   Use the proper sized cuff. It should fit smoothly and snugly around your bare upper arm. There should be enough room to slip a fingertip under the cuff. The bottom edge of the cuff should be 1 inch above the crease of the elbow.   Ideally, take 3 measurements at one sitting and record the average.  Would recommend you stop Mucinex DM for several days, if you have worsening congestion, it is okay to restart, but use as needed, not necessarily twice daily every day.  Try Benadryl 25mg  at bedtime  Keep Tylenol to no more than 500mg  twice daily.

## 2014-06-13 ENCOUNTER — Other Ambulatory Visit: Payer: Self-pay | Admitting: Cardiology

## 2014-06-14 ENCOUNTER — Encounter (HOSPITAL_COMMUNITY): Payer: Medicare Other

## 2014-06-15 ENCOUNTER — Encounter: Payer: Self-pay | Admitting: Pharmacist Clinician (PhC)/ Clinical Pharmacy Specialist

## 2014-06-15 NOTE — Progress Notes (Signed)
06/15/2014 Bruce Green 1919/06/21 [id number]   HPI:  Bruce Green is a 80 y.o. male patient of Dr Sallyanne Kuster, with a PMH below who presents today for a blood pressure check.  He underwent CABG in early June, being discharged on June 7.  Since then he started cardiac rehab, but has been unable to attend due to weakness and symptoms of presyncope.  When he saw Dr. Sallyanne Kuster on 7/16 is BP was recorded at 129/76 (supine), 107/66 (seated) and 72/46 (standing).  His medication list was reviewed and he was advised to stop furosemide, potassium, lisinopril and digoxin.  His carvedilol was also decreased to 3.125mg  twice daily.  Today he is here for follow up and reports still having some dizziness with positional changes and some SOB on exertion.  He does not use any tobacco products and only rarely drinks alcohol.  His daughters do most of his cooking, using [employer] or [employer] substitutes.  He is unable to do much exercise, but does try to walk to the end of the driveway (about 497 feet) at least 3-4 times per day.  His daughter has concerns today regarding some of his OTC meds as well, as her sister (a retired Therapist, sports) has recommended that he take 1gm APAP and 50mg  Benadryl at bedtime to help with sleep.   Current Outpatient Prescriptions  Medication Sig Dispense Refill  . acetaminophen (TYLENOL) 325 MG tablet Take 650 mg by mouth every 4 (four) hours as needed for mild pain or moderate pain.      Marland Kitchen aspirin EC 325 MG tablet Take 325 mg by mouth every morning.      Marland Kitchen atorvastatin (LIPITOR) 80 MG tablet Take 80 mg by mouth every evening.      . B Complex-C-Calcium (B-COMPLEX/VITAMIN C PO) Take 1 tablet by mouth every morning.       . calcium carbonate (OS-CAL) 600 MG TABS tablet Take 600 mg by mouth every morning.      . carvedilol (COREG) 3.125 MG tablet Take 1 tablet (3.125 mg total) by mouth 2 (two) times daily.  026 tablet  3  . folic acid (FOLVITE) 1 MG tablet Take 1 tablet (1 mg total) by mouth  daily. For one month then stop.  30 tablet  0  . pantoprazole (PROTONIX) 40 MG tablet Take 40 mg by mouth every morning.      . polycarbophil (FIBERCON) 625 MG tablet Take 625 mg by mouth every morning.       . polyethylene glycol (MIRALAX / GLYCOLAX) packet Take 17 g by mouth daily as needed for moderate constipation.       . Vitamin D, Ergocalciferol, (DRISDOL) 50000 UNITS CAPS capsule Take 50,000 Units by mouth every Saturday.       No current facility-administered medications for this visit.    Allergies  Allergen Reactions  . Sulfa Antibiotics Rash    Past Medical History  Diagnosis Date  . Stricture esophagus     distal  . GERD (gastroesophageal reflux disease)   . Hiatal hernia     6cm  . Coronary artery disease 04/21/2014      severe triple vessel  . Ischemic cardiomyopathy 03/2014    EF 25-30%    Blood pressure 120/58, height 5\' 9"  (1.753 m), weight 149 lb 14.4 oz (67.994 kg). Standing 92/60    ASSESSMENT AND PLAN: Today Bruce Green's blood pressure is somewhat improved, going from 107/66 to 120/58 while seated.  He still has  significant orthostatic hypotension, with the systolic dropping 28 points.  We discussed the need to always sit on the edge of the bed for a minute or so before rising and being careful not to jump out of a chair and take off without standing still for a moment to get his bearings.  Both patient and his daughter voiced understanding. As his medications affecting BP are at a minimum, I will make no changes today.   Regarding his use of OTC medications, I advised that they discontinue the mucinex DM, but can restart if he feels the cough worsening.  Per his chart, it is believed to be an ACEI reaction, so it should stop soon on its own.  I also advised that if he needs help with sleep, to start with 25mg  Benadryl at bedtime and only take 500mg  APAP when he needs it for aches/pains.  He is to follow up with Dr. Sallyanne Kuster on August 3.    Tommy Medal PharmD  [facility]

## 2014-06-16 ENCOUNTER — Telehealth: Payer: Self-pay | Admitting: Cardiovascular Disease

## 2014-06-16 ENCOUNTER — Encounter (HOSPITAL_COMMUNITY): Payer: Medicare Other

## 2014-06-16 NOTE — Telephone Encounter (Signed)
Please call,p thave been coughing a deep rattling cough. She wants to know what to do about this.

## 2014-06-16 NOTE — Telephone Encounter (Signed)
Spoke to [name] She states Bruce Green cough went away , but this morning @5 -6 am he rolled over and had a deep rattling cough - loose congestion. Per [name] he coughed about 6-8 times  - loose clear mucous  RN informed her to continue to use mucinex on a needed basis. Patient has an appointment on WED with Dr Melvyn Novas . Any upcoming may call on call if needed  sandy verbalized understanding.

## 2014-06-19 ENCOUNTER — Encounter (HOSPITAL_COMMUNITY): Payer: Medicare Other

## 2014-06-20 ENCOUNTER — Encounter: Payer: Self-pay | Admitting: Internal Medicine

## 2014-06-20 ENCOUNTER — Ambulatory Visit (INDEPENDENT_AMBULATORY_CARE_PROVIDER_SITE_OTHER): Payer: Medicare Other | Admitting: Internal Medicine

## 2014-06-20 ENCOUNTER — Telehealth: Payer: Self-pay | Admitting: *Deleted

## 2014-06-20 VITALS — BP 102/60 | HR 75 | Temp 97.5°F | Ht 70.0 in | Wt 150.0 lb

## 2014-06-20 DIAGNOSIS — R0602 Shortness of breath: Secondary | ICD-10-CM

## 2014-06-20 MED ORDER — BISOPROLOL FUMARATE 5 MG PO TABS: 2.5000 mg | ORAL_TABLET | Freq: Every day | ORAL | Status: DC

## 2014-06-20 MED ORDER — PREDNISONE 10 MG PO TABS: ORAL_TABLET | ORAL | Status: DC

## 2014-06-20 NOTE — Patient Instructions (Addendum)
Continue to treat the reflux aggressively as you are: Pantoprazole (protonix) 40 mg   Take 30-60 min before first meal of the day and Pepcid 20 mg one bedtime     Prednisone 10 mg take  4 each am x 2 days,   2 each am x 2 days,  1 each am x 2 days and stop   If breathing / coughing not 100% better next step is to stop the corevidol completely but this should be done through Dr C  Pulmonary follow up can be as needed if still not 100% off the corevidol

## 2014-06-20 NOTE — Telephone Encounter (Signed)
Advised of med change per Dr. Loletha Grayer - stop carvedilol and start bisoprolol 2.5mg  PO QD. Daughter will inform her father. Med ordered.   Message     Eliezer Lofts,    Please ask Mr. Hritz to stop carvedilol and start bisoprolol2.5 mg PO daily     Thanks    MCr        ----- Message -----    From: Tanda Rockers, MD    Sent: 06/20/2014 9:47 AM    To: Sanda Klein, MD        Much, much better off acei and on low dose Coreg but spriometry and symtoms still suggest possible asthma so needs more specific BB if breathing not back to baseline or cough persists off acei x 6 weeks minimum

## 2014-06-20 NOTE — Assessment & Plan Note (Signed)
-   Spirometry 04/21/14 FEV1  1.33 (57%) ratio 58  - 06/06/2014   Walked RA x one lap @ 185 stopped due to  Fatigue, no sob or desat - Spirometry 06/20/2014 1.25 (43) ratio 54   DDX of  difficult airways management all start with A and  include Adherence, Ace Inhibitors, Acid Reflux, Active Sinus Disease, Alpha 1 Antitripsin deficiency, Anxiety masquerading as Airways dz,  ABPA,  allergy(esp in young), Aspiration (esp in elderly), Adverse effects of DPI,  Active smokers, plus two Bs  = Bronchiectasis and Beta blocker use..and one C= CHF  Adherence is always the initial "prime suspect" and is a multilayered concern that requires a "trust but verify" approach in every patient - starting with knowing how to use medications, especially inhalers, correctly, keeping up with refills and understanding the fundamental difference between maintenance and prns vs those medications only taken for a very short course and then stopped and not refilled.   ? acei > clearly better off acei > rec leave off   ? Acid (or non-acid) GERD > always difficult to exclude as up to 75% of pts in some series report no assoc GI/ Heartburn symptoms> rec continue max (24h)  acid suppression and diet restrictions/ reviewed     ? CHF/ cardiac asthma > rx per cards but  ? BB >  Strongly prefer in this setting: Bystolic, the most beta -1  selective Beta blocker available in sample form, with bisoprolol the most selective generic choice  on the market.   ? Allergy/ primary asthma > Prednisone 10 mg take  4 each am x 2 days,   2 each am x 2 days,  1 each am x 2 days and stop then return off coreg if not staying 100% better

## 2014-06-20 NOTE — Progress Notes (Signed)
Subjective:    Patient ID: Bruce Green, male    DOB: 06-24-1919 MRN: [id number]    Brief patient profile:  58 yowm never smoker with doe starting spring 2015 felt to be related to IHD s/p CABG 01/25/18 complicated by large L effusion which had mostly resolved by  05/31/14 f/u with Dr Cyndia Bent who referred him to pulmonary 06/05/14 to eval sob in setting of abn preop pfts dne 04/21/14 ? Representing copd   History of Present Illness  06/06/2014 1st [facility] office visit/ Meloney Feld  Chief Complaint  Patient presents with  . Pulmonary Consult    Referred per Dr. Cyndia Bent for eval of COPD. Pt states he was dxed with COPD on 05/31/14.  Pt c/o SOB for the past 3 months.  He states that he started noticing SOB while working in his garden and having to stop and rest after approx 15 min.  He also c/o cough- occ prod with minimal grey sputum.   prior to spring 2015 baseline = walk a mile s difficulty  Grad progressive doe x 100 ft   Orthopnea / 2 pillows  Assoc with dry cough and hoarseness on acei rec You will need a trial off of the lisinopril and the coreg  For a least several weeks until we can sort out your problem but I do not believe you have significant copd (my recommendation is for diovan and bisoprolol but they need to be approved by Dr C) Protonix 40 mg Take 30-60 min before first meal of the day and Pepcid ac 20 mg at bedtime GERD diet    06/20/2014 f/u ov/Averlee Swartz re: ? Asthma/cardiac vs side effect of cardiac meds  Chief Complaint  Patient presents with  . Follow-up    Pt states that his breathing has improved. Was able to walk to the mailbox and back without any trouble breathing.  Cough is much improved.   activity tol improving 50% better at least, still some noct cough Not on any inhalers    No obvious day to day or daytime variabilty or assoc   cp or chest tightness, subjective wheeze overt sinus or hb symptoms. No unusual exp hx or h/o childhood pna/ asthma or knowledge of  premature birth.  Sleeping ok without nocturnal  or early am exacerbation  of respiratory  c/o's or need for noct saba. Also denies any obvious fluctuation of symptoms with weather or environmental changes or other aggravating or alleviating factors except as outlined above   Current Medications, Allergies, Complete Past Medical History, Past Surgical History, Family History, and Social History were reviewed in [employer] record.  ROS  The following are not active complaints unless bolded sore throat, dysphagia, dental problems, itching, sneezing,  nasal congestion or excess/ purulent secretions, ear ache,   fever, chills, sweats, unintended wt loss, pleuritic or exertional cp, hemoptysis,  orthopnea pnd or leg swelling, presyncope, palpitations, heartburn, abdominal pain, anorexia, nausea, vomiting, diarrhea  or change in bowel or urinary habits, change in stools or urine, dysuria,hematuria,  rash, arthralgias, visual complaints, headache, numbness weakness or ataxia or problems with walking or coordination,  change in mood/affect or memory.                       Objective:   Physical Exam  Elderly  wm nad  Still somewhat congested sounding cough on FVC  06/20/2014        150  Wt Readings from Last 3 Encounters:  06/06/14 148 lb (67.132 kg)  05/31/14 150 lb (68.04 kg)  05/24/14 151 lb 11.2 oz (68.811 kg)      HEENT: nl dentition, turbinates, and orophanx. Nl external ear canals without cough reflex   NECK :  without JVD/Nodes/TM/ nl carotid upstrokes bilaterally   LUNGS: no acc muscle use, clear to A and P bilaterally without cough on insp or exp maneuvers   CV:  RRR  no s3 or murmur or increase in P2, no edema   ABD:  soft and nontender with nl excursion in the supine position. No bruits or organomegaly, bowel sounds nl  MS:  warm without deformities, calf tenderness, cyanosis or clubbing  SKIN: warm and dry without lesions    NEURO:  alert,  approp, no deficits   05/21/14 CTa no emphysema, no pe, mod L effusion with atx  cxr 05/30/14  1. Small left pleural effusion remains. Aeration of the left lung  base has improved.  2. A small nodular opacity overlies the right mid lung of  questionable significance with no abnormality noted on recent CT of  the chest   Lab Results  Component Value Date   PROBNP 2106.0* 05/29/2014    Lab Results  Component Value Date   HGB 13.1 05/29/2014   HGB 12.3* 05/21/2014   HGB 11.9* 05/17/2014      Chemistry      Component Value Date/Time   NA 138 05/29/2014 1729   K 4.7 05/29/2014 1729   CL 98 05/29/2014 1729   CO2 27 05/29/2014 1729   BUN 33* 05/29/2014 1729   CREATININE 1.30 05/29/2014 1729   CREATININE 0.95 05/17/2014 1015      Component Value Date/Time   CALCIUM 9.2 05/29/2014 1729   ALKPHOS 87 05/29/2014 1448   AST 49* 05/29/2014 1448   ALT 14 05/29/2014 1448   BILITOT 0.5 05/29/2014 1448      Lab Results  Component Value Date   TSH 1.319 04/18/2014         Assessment & Plan:

## 2014-06-21 ENCOUNTER — Encounter (HOSPITAL_COMMUNITY): Payer: Medicare Other

## 2014-06-23 ENCOUNTER — Encounter (HOSPITAL_COMMUNITY): Payer: Medicare Other

## 2014-06-26 ENCOUNTER — Encounter (HOSPITAL_COMMUNITY): Payer: Medicare Other

## 2014-06-26 ENCOUNTER — Ambulatory Visit (INDEPENDENT_AMBULATORY_CARE_PROVIDER_SITE_OTHER): Payer: Medicare Other | Admitting: Cardiovascular Disease

## 2014-06-26 ENCOUNTER — Encounter: Payer: Self-pay | Admitting: Cardiovascular Disease

## 2014-06-26 VITALS — BP 118/65 | HR 76 | Resp 16 | Ht 70.0 in | Wt 150.3 lb

## 2014-06-26 DIAGNOSIS — I2589 Other forms of chronic ischemic heart disease: Secondary | ICD-10-CM

## 2014-06-26 DIAGNOSIS — Z79899 Other long term (current) drug therapy: Secondary | ICD-10-CM

## 2014-06-26 DIAGNOSIS — I255 Ischemic cardiomyopathy: Secondary | ICD-10-CM

## 2014-06-26 DIAGNOSIS — I2510 Atherosclerotic heart disease of native coronary artery without angina pectoris: Secondary | ICD-10-CM

## 2014-06-26 DIAGNOSIS — I5020 Unspecified systolic (congestive) heart failure: Secondary | ICD-10-CM

## 2014-06-26 DIAGNOSIS — I509 Heart failure, unspecified: Secondary | ICD-10-CM

## 2014-06-26 DIAGNOSIS — Z951 Presence of aortocoronary bypass graft: Secondary | ICD-10-CM

## 2014-06-26 DIAGNOSIS — I951 Orthostatic hypotension: Secondary | ICD-10-CM

## 2014-06-26 DIAGNOSIS — E782 Mixed hyperlipidemia: Secondary | ICD-10-CM

## 2014-06-26 NOTE — Progress Notes (Signed)
Patient ID: Bruce Green, male   DOB: [birth year], 80 y.o.   MRN: [id number]     Reason for office visit CAD s/p CABG, f/u for hypotension  It has been roughly 7 weeks since Bruce Green underwent elective coronary bypass surgery for multivessel CAD and moderate to severe ischemic cardiomyopathy (EF 25-30%). Despite his advanced age, he had a remarkably uncomplicated postoperative course. He had brief problems with atrial tachycardia/atrial fibrillation. He was discharged on June 7, On June 28 he presented to the emergency room with signs and symptoms of congestive heart failure, despite the fact that he had been started on a low dose of diuretic just 4 days earlier in the office. A chest CT showed no evidence of pulmonary embolism but did show a moderate left pleural effusion. He improved after treatment with diuretics and a more recent chest x-ray shows reduction in the size of his pleural effusion.  A followup left ventricular ejection fraction by echocardiography was 37% on July 10th. While he was at cardiac rehabilitation for orientation he felt extremely weak, his legs were very wobbly and he had symptoms of presyncope. He had evidence of profound orthostatic hypotension on July 16. He stopped his diuretics and cut back his dose of carvedilol. Dr. Melvyn Novas was concerned about carvedilol related lung side effects and we switched his beta blocker to very low-dose bisoprolol.  He feels a lot better today. He was able to walk 500 feet without stopping. He enjoys cardiac rehabilitation. He does have dyspnea if he tries to push it too hard. He has occasional dizziness only he stands up too quickly but has not had the weakness and near-syncope that he experienced last week. He denies angina pectoris. His blood pressure today is normal. His family states that he has improved physically, but he is a lot crankier than he was before surgery.   Allergies  Allergen Reactions  . Sulfa Antibiotics Rash     Current Outpatient Prescriptions  Medication Sig Dispense Refill  . acetaminophen (TYLENOL) 325 MG tablet Take 650 mg by mouth every 4 (four) hours as needed for mild pain or moderate pain.      Marland Kitchen aspirin EC 325 MG tablet Take 325 mg by mouth every morning.      Marland Kitchen atorvastatin (LIPITOR) 80 MG tablet Take 80 mg by mouth every evening.      . B Complex-C-Calcium (B-COMPLEX/VITAMIN C PO) Take 1 tablet by mouth every morning.       . bisoprolol (ZEBETA) 5 MG tablet Take 0.5 tablets (2.5 mg total) by mouth daily.  15 tablet  6  . calcium carbonate (OS-CAL) 600 MG TABS tablet Take 600 mg by mouth every morning.      . folic acid (FOLVITE) 1 MG tablet Take 1 tablet (1 mg total) by mouth daily. For one month then stop.  30 tablet  0  . guaiFENesin (MUCINEX) 600 MG 12 hr tablet Take 600 mg by mouth 2 (two) times daily as needed.      . pantoprazole (PROTONIX) 40 MG tablet Take 40 mg by mouth every morning.      . polycarbophil (FIBERCON) 625 MG tablet Take 625 mg by mouth every morning.       . polyethylene glycol (MIRALAX / GLYCOLAX) packet Take 17 g by mouth daily as needed for moderate constipation.       . predniSONE (DELTASONE) 10 MG tablet Take  4 each am x 2 days,   2 each am x 2  days,  1 each am x 2 days and stop  14 tablet  0  . Vitamin D, Ergocalciferol, (DRISDOL) 50000 UNITS CAPS capsule Take 50,000 Units by mouth every Saturday.       No current facility-administered medications for this visit.    Past Medical History  Diagnosis Date  . Stricture esophagus     distal  . GERD (gastroesophageal reflux disease)   . Hiatal hernia     6cm  . Coronary artery disease 04/21/2014      severe triple vessel  . Ischemic cardiomyopathy 03/2014    EF 25-30%    Past Surgical History  Procedure Laterality Date  . Cholecystectomy    . Shoulder surgery    . Appendectomy    . Esophagogastroduodenoscopy  04/09/2012    Procedure: ESOPHAGOGASTRODUODENOSCOPY (EGD);  Surgeon: Gatha Mayer, MD;   Location: [facility];  Service: Endoscopy;  Laterality: N/A;  . Coronary artery bypass graft N/A 04/24/2014    Procedure: CORONARY ARTERY BYPASS GRAFTING (CABG);  Surgeon: Gaye Pollack, MD;  Location: [facility];  Service: Open Heart Surgery;  Laterality: N/A;  Times 3 using left internal mammary artery and endoscopically harvested right saphenous vein  . Intraoperative transesophageal echocardiogram N/A 04/24/2014    Procedure: INTRAOPERATIVE TRANSESOPHAGEAL ECHOCARDIOGRAM;  Surgeon: Gaye Pollack, MD;  Location[name];  Service: Open Heart Surgery;  Laterality: N/A;    Family History  Problem Relation Age of Onset  . Heart disease Mother   . Kidney disease Father     History   Social History  . Marital Status: Widowed    Spouse Name: N/A    Number of Children: N/A  . Years of Education: N/A   Occupational History  . retired    Social History Main Topics  . Smoking status: Never Smoker   . Smokeless tobacco: Never Used  . Alcohol Use: 0.5 oz/week    1 drink(s) per week     Comment: rare  . Drug Use: No  . Sexual Activity: Not on file   Other Topics Concern  . Not on file   Social History Narrative   Retired Conservation officer, historic buildings. Widowed.          Review of systems: denies dyspnea or angina at rest or with usual exertion, orthopnea, paroxysmal nocturnal dyspnea, syncope, palpitations, focal neurological deficits, intermittent claudication, lower extremity edema, unexplained weight gain, hemoptysis or wheezing.  The patient also denies abdominal pain, nausea, vomiting, dysphagia, diarrhea, constipation, polyuria, polydipsia, dysuria, hematuria, frequency, urgency, abnormal bleeding or bruising, fever, chills, unexpected weight changes, mood swings, change in skin or hair texture, change in voice quality, auditory or visual problems, allergic reactions or rashes, new musculoskeletal complaints other than mild postsurgical pain.   PHYSICAL EXAM BP 118/65  Pulse 76  Resp 16  Ht 5'  10" (1.778 m)  Wt 150 lb 4.8 oz (68.176 kg)  BMI 21.57 kg/m2 General: Alert, oriented x3, no distress. He does not appear pale  Head: no evidence of trauma, PERRL, EOMI, no exophtalmos or lid lag, no myxedema, no xanthelasma; normal ears, nose and oropharynx  Neck: flat jugular venous pulsations and no hepatojugular reflux; brisk carotid pulses without delay and no carotid bruits  Chest: clear to auscultation, no signs of consolidation by percussion or palpation, normal fremitus, symmetrical and full respiratory excursions. Rapidly healing sternotomy scar.  Cardiovascular: normal position and quality of the apical impulse, regular rhythm, normal first and second heart sounds, no murmurs, rubs or gallops  Abdomen:  no tenderness or distention, no masses by palpation, no abnormal pulsatility or arterial bruits, normal bowel sounds, no hepatosplenomegaly  Extremities: no clubbing, cyanosis or edema; 2+ radial, ulnar and brachial pulses bilaterally; 2+ right femoral, posterior tibial and dorsalis pedis pulses; 2+ left femoral, posterior tibial and dorsalis pedis pulses; no subclavian or femoral bruits  Neurological: grossly nonfocal     BMET    Component Value Date/Time   NA 139 06/08/2014 1642   K 4.9 06/08/2014 1642   CL 101 06/08/2014 1642   CO2 26 06/08/2014 1642   GLUCOSE 93 06/08/2014 1642   BUN 34* 06/08/2014 1642   CREATININE 1.30 06/08/2014 1642   CREATININE 1.30 05/29/2014 1729   CALCIUM 8.8 06/08/2014 1642   GFRNONAA 46* 05/29/2014 1729   GFRNONAA 69 05/17/2014 1015   GFRAA 53* 05/29/2014 1729   GFRAA 80 05/17/2014 1015     ASSESSMENT AND PLAN  Improved hypotension without evidence of heart failure after we stopped his diuretic. Last week's labs suggested that he was indeed slightly dehydrated. Making good progress with cardiac rehabilitation. Another assessment of left ventricular ejection fraction in 2-3 months. Check his lipids in roughly one month.  Patient Instructions  Your  physician has recommended you make the following change in your medication: Decrease Aspirin to 81 mg daily  Your physician recommends that you return for lab work in: 4 weeks  Your physician recommends that you schedule a follow-up appointment in: 6 weeks with Dr.Roger Kettles      Orders Placed This Encounter  Procedures  . Comp Met (CMET)  . Lipid Profile   No orders of the defined types were placed in this encounter.    Holli Humbles, MD, [place] [phone] office [phone] pager

## 2014-06-26 NOTE — Patient Instructions (Signed)
Your physician has recommended you make the following change in your medication: Decrease Aspirin to 81 mg daily  Your physician recommends that you return for lab work in: 4 weeks  Your physician recommends that you schedule a follow-up appointment in: 6 weeks with Dr.Croitoru

## 2014-06-28 ENCOUNTER — Encounter (HOSPITAL_COMMUNITY): Payer: Medicare Other

## 2014-06-30 ENCOUNTER — Encounter (HOSPITAL_COMMUNITY): Payer: Medicare Other

## 2014-07-02 NOTE — Addendum Note (Signed)
Encounter addended by: Romona Curls, [place] on: 07/02/2014 11:30 AM<BR>     Documentation filed: Clinical Notes

## 2014-07-03 ENCOUNTER — Encounter (HOSPITAL_COMMUNITY): Payer: Medicare Other

## 2014-07-03 ENCOUNTER — Other Ambulatory Visit: Payer: Self-pay | Admitting: Internal Medicine

## 2014-07-03 ENCOUNTER — Telehealth: Payer: Self-pay | Admitting: Cardiovascular Disease

## 2014-07-03 MED ORDER — ATORVASTATIN CALCIUM 80 MG PO TABS: 80.0000 mg | ORAL_TABLET | Freq: Every evening | ORAL | Status: DC

## 2014-07-03 NOTE — Telephone Encounter (Signed)
[name] called in wanting to know if Bruce Green was still taking the Atorvastatin 80 mg and if so the pharmacy needed to be notified about that soit can be refilled. Please call  Thanks

## 2014-07-03 NOTE — Telephone Encounter (Signed)
Atorvastatin refill sent to pharmacy 

## 2014-07-05 ENCOUNTER — Encounter (HOSPITAL_COMMUNITY): Payer: Medicare Other

## 2014-07-07 ENCOUNTER — Encounter (HOSPITAL_COMMUNITY): Payer: Medicare Other

## 2014-07-10 ENCOUNTER — Encounter (HOSPITAL_COMMUNITY): Payer: Medicare Other

## 2014-07-12 ENCOUNTER — Encounter (HOSPITAL_COMMUNITY): Payer: Medicare Other

## 2014-07-14 ENCOUNTER — Encounter (HOSPITAL_COMMUNITY): Payer: Medicare Other

## 2014-07-17 ENCOUNTER — Encounter (HOSPITAL_COMMUNITY): Payer: Medicare Other

## 2014-07-19 ENCOUNTER — Encounter (HOSPITAL_COMMUNITY): Payer: Medicare Other

## 2014-07-19 LAB — LIPID PANEL
CHOL/HDL RATIO: 4.4 ratio
Cholesterol: 175 mg/dL (ref 0–200)
HDL: 40 mg/dL (ref >39)
LDL Cholesterol: 77 mg/dL (ref 0–99)
TRIGLYCERIDES: 290 mg/dL — AB (ref <150)
VLDL: 58 mg/dL — AB (ref 0–40)

## 2014-07-19 LAB — COMPREHENSIVE METABOLIC PANEL
ALBUMIN: 4.5 g/dL (ref 3.5–5.2)
ALK PHOS: 112 U/L (ref 39–117)
ALT: 17 U/L (ref 0–53)
AST: 19 U/L (ref 0–37)
BILIRUBIN TOTAL: 0.5 mg/dL (ref 0.2–1.2)
BUN: 21 mg/dL (ref 6–23)
CO2: 26 mEq/L (ref 19–32)
CREATININE: 1.1 mg/dL (ref 0.50–1.35)
Calcium: 9.5 mg/dL (ref 8.4–10.5)
Chloride: 104 mEq/L (ref 96–112)
GLUCOSE: 110 mg/dL — AB (ref 70–99)
Potassium: 4.4 mEq/L (ref 3.5–5.3)
Sodium: 141 mEq/L (ref 135–145)
Total Protein: 6.7 g/dL (ref 6.0–8.3)

## 2014-07-21 ENCOUNTER — Encounter (HOSPITAL_COMMUNITY): Payer: Medicare Other

## 2014-07-24 ENCOUNTER — Encounter (HOSPITAL_COMMUNITY): Payer: Medicare Other

## 2014-07-26 ENCOUNTER — Encounter (HOSPITAL_COMMUNITY): Payer: Medicare Other

## 2014-07-28 ENCOUNTER — Encounter (HOSPITAL_COMMUNITY): Payer: Medicare Other

## 2014-08-02 ENCOUNTER — Encounter (HOSPITAL_COMMUNITY): Payer: Medicare Other

## 2014-08-04 ENCOUNTER — Encounter (HOSPITAL_COMMUNITY): Payer: Medicare Other

## 2014-08-07 ENCOUNTER — Encounter (HOSPITAL_COMMUNITY): Payer: Medicare Other

## 2014-08-09 ENCOUNTER — Ambulatory Visit (INDEPENDENT_AMBULATORY_CARE_PROVIDER_SITE_OTHER): Payer: Medicare Other | Admitting: Cardiovascular Disease

## 2014-08-09 ENCOUNTER — Encounter (HOSPITAL_COMMUNITY): Payer: Medicare Other

## 2014-08-09 VITALS — BP 125/75 | HR 56 | Resp 16 | Ht 70.0 in | Wt 154.1 lb

## 2014-08-09 DIAGNOSIS — Z79899 Other long term (current) drug therapy: Secondary | ICD-10-CM

## 2014-08-09 DIAGNOSIS — R5381 Other malaise: Secondary | ICD-10-CM

## 2014-08-09 DIAGNOSIS — R5383 Other fatigue: Secondary | ICD-10-CM

## 2014-08-09 DIAGNOSIS — E782 Mixed hyperlipidemia: Secondary | ICD-10-CM

## 2014-08-09 DIAGNOSIS — I509 Heart failure, unspecified: Secondary | ICD-10-CM

## 2014-08-09 NOTE — Patient Instructions (Signed)
Your physician has requested that you have an echocardiogram. Echocardiography is a painless test that uses sound waves to create images of your heart. It provides your doctor with information about the size and shape of your heart and how well your heart's chambers and valves are working. This procedure takes approximately one hour. There are no restrictions for this procedure.  Your physician recommends that you return for lab work in: FASTING at [place] lab.  Dr. Sallyanne Kuster recommends that you schedule a follow-up appointment in: 3 Months.

## 2014-08-11 ENCOUNTER — Encounter (HOSPITAL_COMMUNITY): Payer: Medicare Other

## 2014-08-14 ENCOUNTER — Encounter: Payer: Self-pay | Admitting: Cardiovascular Disease

## 2014-08-14 ENCOUNTER — Encounter (HOSPITAL_COMMUNITY): Payer: Medicare Other

## 2014-08-14 LAB — CBC
HEMATOCRIT: 41.7 % (ref 39.0–52.0)
Hemoglobin: 13.1 g/dL (ref 13.0–17.0)
MCH: 24.7 pg — ABNORMAL LOW (ref 26.0–34.0)
MCHC: 31.4 g/dL (ref 30.0–36.0)
MCV: 78.7 fL (ref 78.0–100.0)
PLATELETS: 251 10*3/uL (ref 150–400)
RBC: 5.3 MIL/uL (ref 4.22–5.81)
RDW: 17.2 % — ABNORMAL HIGH (ref 11.5–15.5)
WBC: 7.5 10*3/uL (ref 4.0–10.5)

## 2014-08-14 NOTE — Progress Notes (Signed)
Patient ID: KYLYN SOOKRAM, male   DOB: Mar 13, 1919, 80 y.o.   MRN: [id number]     Reason for office visit CAD s/p CABG, CHF  Mr. Hervey Ard continues to progress well and is bright on target for post bypass surgery recovery despite the fact that he is 80 years of age. He expresses impatience with some residual weakness and dyspnea, but he has made great progress. He now has NYHA functional class II dyspnea on exertion.  It has been roughly 3 months since Mr. Reinoso underwent elective coronary bypass surgery for multivessel CAD and moderate to severe ischemic cardiomyopathy (EF 25-30%). Despite his advanced age, he had a remarkably uncomplicated postoperative course. He had brief problems with atrial tachycardia/atrial fibrillation. He was discharged on June 7, On June 28 he presented to the emergency room with signs and symptoms of congestive heart failure, despite the fact that he had been started on a low dose of diuretic just 4 days earlier in the office. A chest CT showed no evidence of pulmonary embolism but did show a moderate left pleural effusion. He improved after treatment with diuretics and a more recent chest x-ray shows reduction in the size of his pleural effusion.  A followup left ventricular ejection fraction by echocardiography was 37% on July 10th. While he was at cardiac rehabilitation for orientation he felt extremely weak, his legs were very wobbly and he had symptoms of presyncope. He had evidence of profound orthostatic hypotension on July 16. He stopped his diuretics and cut back his dose of carvedilol.  Dr. Melvyn Novas was concerned about carvedilol related lung side effects and we switched his beta blocker to very low-dose bisoprolol.    Allergies  Allergen Reactions  . Sulfa Antibiotics Rash    Current Outpatient Prescriptions  Medication Sig Dispense Refill  . aspirin 81 MG tablet Take 81 mg by mouth daily.      Marland Kitchen atorvastatin (LIPITOR) 80 MG tablet Take 1 tablet (80 mg total)  by mouth every evening.  30 tablet  3  . B Complex-C-Calcium (B-COMPLEX/VITAMIN C PO) Take 1 tablet by mouth every morning.       . bisoprolol (ZEBETA) 5 MG tablet Take 0.5 tablets (2.5 mg total) by mouth daily.  15 tablet  6  . calcium carbonate (OS-CAL) 600 MG TABS tablet Take 600 mg by mouth every morning.      . folic acid (FOLVITE) 1 MG tablet Take 1 mg by mouth 2 (two) times daily.      . pantoprazole (PROTONIX) 40 MG tablet Take 40 mg by mouth every morning.      . polyethylene glycol (MIRALAX / GLYCOLAX) packet Take 17 g by mouth daily as needed for moderate constipation.       . Vitamin D, Ergocalciferol, (DRISDOL) 50000 UNITS CAPS capsule Take 50,000 Units by mouth every Saturday.       No current facility-administered medications for this visit.    Past Medical History  Diagnosis Date  . Stricture esophagus     distal  . GERD (gastroesophageal reflux disease)   . Hiatal hernia     6cm  . Coronary artery disease 04/21/2014      severe triple vessel  . Ischemic cardiomyopathy 03/2014    EF 25-30%    Past Surgical History  Procedure Laterality Date  . Cholecystectomy    . Shoulder surgery    . Appendectomy    . Esophagogastroduodenoscopy  04/09/2012    Procedure: ESOPHAGOGASTRODUODENOSCOPY (EGD);  Surgeon: Ofilia Neas  Carlean Purl, MD;  Location: [facility];  Service: Endoscopy;  Laterality: N/A;  . Coronary artery bypass graft N/A 04/24/2014    Procedure: CORONARY ARTERY BYPASS GRAFTING (CABG);  Surgeon: Gaye Pollack, MD;  Location: [facility];  Service: Open Heart Surgery;  Laterality: N/A;  Times 3 using left internal mammary artery and endoscopically harvested right saphenous vein  . Intraoperative transesophageal echocardiogram N/A 04/24/2014    Procedure: INTRAOPERATIVE TRANSESOPHAGEAL ECHOCARDIOGRAM;  Surgeon: Gaye Pollack, MD;  Location: [facility];  Service: Open Heart Surgery;  Laterality: N/A;    Family History  Problem Relation Age of Onset  . Heart disease Mother   . Kidney  disease Father     History   Social History  . Marital Status: Widowed    Spouse Name: N/A    Number of Children: N/A  . Years of Education: N/A   Occupational History  . retired    Social History Main Topics  . Smoking status: Never Smoker   . Smokeless tobacco: Never Used  . Alcohol Use: 0.5 oz/week    1 drink(s) per week     Comment: rare  . Drug Use: No  . Sexual Activity: Not on file   Other Topics Concern  . Not on file   Social History Narrative   Retired Conservation officer, historic buildings. Widowed.          Review of systems: The patient specifically denies any chest pain at rest or with exertion, dyspnea at rest, orthopnea, paroxysmal nocturnal dyspnea, syncope, palpitations, focal neurological deficits, intermittent claudication, lower extremity edema, unexplained weight gain, cough, hemoptysis or wheezing.  The patient also denies abdominal pain, nausea, vomiting, dysphagia, diarrhea, constipation, polyuria, polydipsia, dysuria, hematuria, frequency, urgency, abnormal bleeding or bruising, fever, chills, unexpected weight changes, mood swings, change in skin or hair texture, change in voice quality, auditory or visual problems, allergic reactions or rashes, new musculoskeletal complaints other than usual "aches and pains".   PHYSICAL EXAM BP 125/75  Pulse 56  Resp 16  Ht 5\' 10"  (1.778 m)  Wt 69.899 kg (154 lb 1.6 oz)  BMI 22.11 kg/m2 General: Alert, oriented x3, no distress. He does not appear pale  Head: no evidence of trauma, PERRL, EOMI, no exophtalmos or lid lag, no myxedema, no xanthelasma; normal ears, nose and oropharynx  Neck: flat jugular venous pulsations and no hepatojugular reflux; brisk carotid pulses without delay and no carotid bruits  Chest: clear to auscultation, no signs of consolidation by percussion or palpation, normal fremitus, symmetrical and full respiratory excursions. Healed sternotomy scar.  Cardiovascular: normal position and quality of the apical  impulse, regular rhythm, normal first and second heart sounds, no murmurs, rubs or gallops  Abdomen: no tenderness or distention, no masses by palpation, no abnormal pulsatility or arterial bruits, normal bowel sounds, no hepatosplenomegaly  Extremities: no clubbing, cyanosis or edema; 2+ radial, ulnar and brachial pulses bilaterally; 2+ right femoral, posterior tibial and dorsalis pedis pulses; 2+ left femoral, posterior tibial and dorsalis pedis pulses; no subclavian or femoral bruits  Neurological: grossly nonfocal   Lipid Panel     Component Value Date/Time   CHOL 175 07/18/2014 0903   TRIG 290* 07/18/2014 0903   HDL 40 07/18/2014 0903   CHOLHDL 4.4 07/18/2014 0903   VLDL 58* 07/18/2014 0903   LDLCALC 77 07/18/2014 0903    BMET    Component Value Date/Time   NA 141 07/18/2014 0903   K 4.4 07/18/2014 0903   CL 104 07/18/2014 0903   CO2  26 07/18/2014 0903   GLUCOSE 110* 07/18/2014 0903   BUN 21 07/18/2014 0903   CREATININE 1.10 07/18/2014 0903   CREATININE 1.30 05/29/2014 1729   CALCIUM 9.5 07/18/2014 0903   GFRNONAA 46* 05/29/2014 1729   GFRNONAA 69 05/17/2014 1015   GFRAA 53* 05/29/2014 1729   GFRAA 80 05/17/2014 1015     ASSESSMENT AND PLAN  Mr. Cumpian shows evidence of significant improvement, although he is not yet fully recovered after bypass surgery.  Will soon be time to reevaluate left ventricular ejection fraction, which I am hopeful will have shown significant further improvement. He is tolerating his current medication regimen without dizziness or weakness. Low blood pressure prevented the use of ACE inhibitors/ARB, but there might be some room to start these medications if his ejection fraction is still low.  His lipid profile is quite favorable with the exception of mild hypertriglyceridemia. He is actually very lean. Discussed carbohydrate restriction. May have to add a fibrate in the future, if TG remain high.  Patient Instructions  Your physician has requested that you have  an echocardiogram. Echocardiography is a painless test that uses sound waves to create images of your heart. It provides your doctor with information about the size and shape of your heart and how well your heart's chambers and valves are working. This procedure takes approximately one hour. There are no restrictions for this procedure.  Your physician recommends that you return for lab work in: FASTING at [facility].  Dr. Sallyanne Kuster recommends that you schedule a follow-up appointment in: 3 Months.       Orders Placed This Encounter  Procedures  . CBC  . Comprehensive metabolic panel  . Lipid panel  . 2D Echocardiogram without contrast   Meds ordered this encounter  Medications  . aspirin 81 MG tablet    Sig: Take 81 mg by mouth daily.  . folic acid (FOLVITE) 1 MG tablet    Sig: Take 1 mg by mouth 2 (two) times daily.    Holli Humbles, MD, [place] [phone] office [phone] pager

## 2014-08-15 LAB — COMPREHENSIVE METABOLIC PANEL
ALBUMIN: 3.6 g/dL (ref 3.5–5.2)
ALK PHOS: 115 U/L (ref 39–117)
ALT: 23 U/L (ref 0–53)
AST: 25 U/L (ref 0–37)
BUN: 21 mg/dL (ref 6–23)
CO2: 27 mEq/L (ref 19–32)
CREATININE: 1.13 mg/dL (ref 0.50–1.35)
Calcium: 9.3 mg/dL (ref 8.4–10.5)
Chloride: 105 mEq/L (ref 96–112)
Glucose, Bld: 158 mg/dL — ABNORMAL HIGH (ref 70–99)
Potassium: 4.7 mEq/L (ref 3.5–5.3)
Sodium: 139 mEq/L (ref 135–145)
Total Bilirubin: 0.6 mg/dL (ref 0.2–1.2)
Total Protein: 5.5 g/dL — ABNORMAL LOW (ref 6.0–8.3)

## 2014-08-15 LAB — LIPID PANEL
Cholesterol: 129 mg/dL (ref 0–200)
HDL: 36 mg/dL — AB (ref >39)
LDL Cholesterol: 63 mg/dL (ref 0–99)
Total CHOL/HDL Ratio: 3.6 Ratio
Triglycerides: 149 mg/dL (ref <150)
VLDL: 30 mg/dL (ref 0–40)

## 2014-08-16 ENCOUNTER — Encounter (HOSPITAL_COMMUNITY): Payer: Medicare Other

## 2014-08-18 ENCOUNTER — Encounter (HOSPITAL_COMMUNITY): Payer: Medicare Other

## 2014-08-21 ENCOUNTER — Encounter (HOSPITAL_COMMUNITY): Payer: Medicare Other

## 2014-08-22 ENCOUNTER — Ambulatory Visit (HOSPITAL_COMMUNITY)
Admission: RE | Admit: 2014-08-22 | Discharge: 2014-08-22 | Disposition: A | Discharge status: Home / Self Care | Payer: Medicare Other | Source: Ambulatory Visit | Attending: Cardiovascular Disease | Admitting: Cardiovascular Disease

## 2014-08-22 ENCOUNTER — Telehealth: Payer: Self-pay | Admitting: Cardiovascular Disease

## 2014-08-22 DIAGNOSIS — Z8249 Family history of ischemic heart disease and other diseases of the circulatory system: Secondary | ICD-10-CM | POA: Diagnosis not present

## 2014-08-22 DIAGNOSIS — I2510 Atherosclerotic heart disease of native coronary artery without angina pectoris: Secondary | ICD-10-CM | POA: Insufficient documentation

## 2014-08-22 DIAGNOSIS — I4891 Unspecified atrial fibrillation: Secondary | ICD-10-CM | POA: Insufficient documentation

## 2014-08-22 DIAGNOSIS — Z951 Presence of aortocoronary bypass graft: Secondary | ICD-10-CM

## 2014-08-22 DIAGNOSIS — I359 Nonrheumatic aortic valve disorder, unspecified: Secondary | ICD-10-CM

## 2014-08-22 DIAGNOSIS — I509 Heart failure, unspecified: Secondary | ICD-10-CM

## 2014-08-22 NOTE — Progress Notes (Signed)
2D Echocardiogram Complete.  08/22/2014   Tammra Pressman [place], RDCS

## 2014-08-22 NOTE — Telephone Encounter (Signed)
Closed encounter °

## 2014-08-23 ENCOUNTER — Encounter (HOSPITAL_COMMUNITY): Payer: Medicare Other

## 2014-08-25 ENCOUNTER — Encounter (HOSPITAL_COMMUNITY): Payer: Medicare Other

## 2014-08-28 ENCOUNTER — Encounter (HOSPITAL_COMMUNITY): Payer: Medicare Other

## 2014-08-30 ENCOUNTER — Encounter (HOSPITAL_COMMUNITY): Payer: Medicare Other

## 2014-09-01 ENCOUNTER — Encounter: Payer: Self-pay | Admitting: Internal Medicine

## 2014-09-01 ENCOUNTER — Encounter (HOSPITAL_COMMUNITY): Payer: Medicare Other

## 2014-09-01 ENCOUNTER — Ambulatory Visit (INDEPENDENT_AMBULATORY_CARE_PROVIDER_SITE_OTHER): Payer: Medicare Other | Admitting: Internal Medicine

## 2014-09-01 VITALS — BP 130/60 | HR 60 | Ht 69.0 in | Wt 151.1 lb

## 2014-09-01 DIAGNOSIS — K222 Esophageal obstruction: Secondary | ICD-10-CM

## 2014-09-01 DIAGNOSIS — K219 Gastro-esophageal reflux disease without esophagitis: Secondary | ICD-10-CM

## 2014-09-01 MED ORDER — PANTOPRAZOLE SODIUM 40 MG PO TBEC: 40.0000 mg | DELAYED_RELEASE_TABLET | Freq: Every day | ORAL | Status: DC

## 2014-09-01 NOTE — Patient Instructions (Signed)
I have refilled the pantoprazole. Let me know if you have any swallowing problems.   I appreciate the opportunity to care for you. Gatha Mayer, MD, [facility]

## 2014-09-01 NOTE — Progress Notes (Signed)
   Subjective:    Patient ID: Bruce Green, male    DOB: 1919/07/28, 80 y.o.   MRN: [id number]  HPI Patient is here with his daughter for followup of gastroesophageal reflux disease esophageal stricture. He is on pantoprazole doing well. He has a fair amount of fatigue which is disturbing to him. He had bypass surgery this spring. All in all he is recovering well.  Medications, allergies, past medical history, past surgical history, family history and social history are reviewed and updated in the EMR.  Review of Systems As above    Objective:   Physical Exam Elderly white man in no acute distress looking younger than stated age       Assessment & Plan:    GERD with stricture Refill PPI

## 2014-09-01 NOTE — Assessment & Plan Note (Signed)
Refill PPI 

## 2014-09-04 ENCOUNTER — Encounter (HOSPITAL_COMMUNITY): Payer: Medicare Other

## 2014-09-06 ENCOUNTER — Encounter (HOSPITAL_COMMUNITY): Payer: Medicare Other

## 2014-09-07 ENCOUNTER — Telehealth: Payer: Self-pay | Admitting: Cardiovascular Disease

## 2014-09-07 NOTE — Telephone Encounter (Signed)
Bruce Green called in wanting to know if some lab orders needed to be put in for pt for when he comes in to see Dr. Loletha Grayer for 6 wk f/u. Please call  Thanks

## 2014-09-07 NOTE — Telephone Encounter (Signed)
Pt. Wanting lab orders before next visit

## 2014-09-07 NOTE — Telephone Encounter (Signed)
Does not need labs just done 08/14/14.  Also, does not need appt 09/13/14.  Last OV Dr. Loletha Grayer wanted him to come back in 3 months and has an appt set up in December.  Daughter voiced understanding.  Had some stomach issues Sunday while at church and looked pale.  Went home and slept off and on for most of the day.  Daughters are concerned about this and recommended they contact his PCP.

## 2014-09-08 ENCOUNTER — Encounter (HOSPITAL_COMMUNITY): Payer: Medicare Other

## 2014-09-11 ENCOUNTER — Encounter (HOSPITAL_COMMUNITY): Payer: Medicare Other

## 2014-09-13 ENCOUNTER — Ambulatory Visit: Payer: Medicare Other | Admitting: Cardiovascular Disease

## 2014-09-13 ENCOUNTER — Encounter (HOSPITAL_COMMUNITY): Payer: Medicare Other

## 2014-09-15 ENCOUNTER — Encounter (HOSPITAL_COMMUNITY): Payer: Medicare Other

## 2014-09-28 ENCOUNTER — Other Ambulatory Visit: Payer: Self-pay | Admitting: Cardiovascular Disease

## 2014-09-28 NOTE — Telephone Encounter (Signed)
Rx refill sent to patient pharmacy   

## 2014-10-12 ENCOUNTER — Telehealth: Payer: Self-pay | Admitting: Cardiovascular Disease

## 2014-10-12 NOTE — Telephone Encounter (Signed)
Pt received in the mail a scales and computer to monitor his weight. She wants to know who ordered this and why?

## 2014-10-12 NOTE — Telephone Encounter (Signed)
I spoke with Bruce Green and the pt and made them aware that this scale is provided to the pt through [facility]. I spoke with them about a nurse case manager monitoring the weight through [facility] and then they contact our office if they notice that the pt is starting to develop symptoms of CHF.  We can then intervene if needed (i.e. medications, office visit, hospitalization). The pt is adamant at this time that he does not want to participate in this program. The pt's daughter will continue to work with the pt to get him involved in weighing daily.

## 2014-10-23 ENCOUNTER — Telehealth: Payer: Self-pay | Admitting: Cardiovascular Disease

## 2014-10-23 NOTE — Telephone Encounter (Signed)
No, was replaced with bisoprolol

## 2014-10-23 NOTE — Telephone Encounter (Signed)
I have talked to this pt.s daughter and informed her of Dr. Victorino December instructions

## 2014-10-23 NOTE — Telephone Encounter (Signed)
Need to know if pt is still suppose to take Coreg?

## 2014-10-23 NOTE — Telephone Encounter (Signed)
Pt. Wants to know if he is  still suppose to be taking Coreg

## 2014-11-02 ENCOUNTER — Encounter (HOSPITAL_COMMUNITY): Payer: Self-pay | Admitting: Cardiovascular Disease

## 2014-11-06 ENCOUNTER — Encounter: Payer: Self-pay | Admitting: Cardiovascular Disease

## 2014-11-06 ENCOUNTER — Ambulatory Visit (INDEPENDENT_AMBULATORY_CARE_PROVIDER_SITE_OTHER): Payer: Medicare Other | Admitting: Cardiovascular Disease

## 2014-11-06 VITALS — BP 112/58 | HR 69 | Resp 16 | Ht 70.0 in | Wt 154.9 lb

## 2014-11-06 DIAGNOSIS — I2510 Atherosclerotic heart disease of native coronary artery without angina pectoris: Secondary | ICD-10-CM

## 2014-11-06 DIAGNOSIS — I5020 Unspecified systolic (congestive) heart failure: Secondary | ICD-10-CM

## 2014-11-06 DIAGNOSIS — Z951 Presence of aortocoronary bypass graft: Secondary | ICD-10-CM

## 2014-11-06 NOTE — Progress Notes (Signed)
Patient ID: Bruce Green, male   DOB: 1919/02/17, 80 y.o.   MRN: [id number]     Reason for office visit CAD s/p CABG, ischemic cardiomyopathy, CHF  Bruce Green is now approximately 6 months status post elective coronary artery bypass surgery for multivessel CAD that presented with moderate to severe ischemic cardiomyopathy with left ventricular ejection fraction of 25-30 percent and symptoms of congestive heart failure. He had brief postoperative problems with atrial tachycardia and atrial fibrillation that have not recurred. He was hospitalized with congestive heart failure in late June. At that point his LV EF had improved to 37%. Another follow-up echo in September shows further improvement in LVEF to roughly 45%. Ace inhibitors and diuretics are not being used due to profound orthostatic hypotension, but he is on a low dose of cardioselective beta blocker.  He has shown steady and remarkable improvement despite his advanced age. He is living independently. He walks a mile a day. He wants to return to driving. His only complaint is some memory issues after cardiopulmonary bypass, especially recalling people's names, but he still takes care of all his personal affairs without assistance. Allergies  Allergen Reactions  . Sulfa Antibiotics Rash    Current Outpatient Prescriptions  Medication Sig Dispense Refill  . aspirin 81 MG tablet Take 81 mg by mouth daily.    Marland Kitchen atorvastatin (LIPITOR) 80 MG tablet TAKE 1 TABLET (80 MG TOTAL) BY MOUTH EVERY EVENING. (FOR HIGH CHOLESTEROL) 30 tablet 5  . B Complex-C-Calcium (B-COMPLEX/VITAMIN C PO) Take 1 tablet by mouth every morning.     . bisoprolol (ZEBETA) 5 MG tablet Take 0.5 tablets (2.5 mg total) by mouth daily. 15 tablet 6  . cholecalciferol (VITAMIN D) 1000 UNITS tablet Take 1,000 Units by mouth daily.    . folic acid (FOLVITE) 1 MG tablet Take 1 mg by mouth 2 (two) times daily.    . Multiple Vitamin (MULTIVITAMIN WITH MINERALS) TABS tablet Take  1 tablet by mouth daily.    . pantoprazole (PROTONIX) 40 MG tablet Take 1 tablet (40 mg total) by mouth daily before breakfast. 90 tablet 3   No current facility-administered medications for this visit.    Past Medical History  Diagnosis Date  . Stricture esophagus     distal  . GERD (gastroesophageal reflux disease)   . Hiatal hernia     6cm  . Coronary artery disease 04/21/2014      severe triple vessel  . Ischemic cardiomyopathy 03/2014    EF 25-30%    Past Surgical History  Procedure Laterality Date  . Cholecystectomy    . Shoulder surgery    . Appendectomy    . Esophagogastroduodenoscopy  04/09/2012    Procedure: ESOPHAGOGASTRODUODENOSCOPY (EGD);  Surgeon: Gatha Mayer, MD;  Location: [facility];  Service: Endoscopy;  Laterality: N/A;  . Coronary artery bypass graft N/A 04/24/2014    Procedure: CORONARY ARTERY BYPASS GRAFTING (CABG);  Surgeon: Gaye Pollack, MD;  Location: [facility];  Service: Open Heart Surgery;  Laterality: N/A;  Times 3 using left internal mammary artery and endoscopically harvested right saphenous vein  . Intraoperative transesophageal echocardiogram N/A 04/24/2014    Procedure: INTRAOPERATIVE TRANSESOPHAGEAL ECHOCARDIOGRAM;  Surgeon: Gaye Pollack, MD;  Location: [facility] OR;  Service: Open Heart Surgery;  Laterality: N/A;  . Left and right heart catheterization with coronary angiogram N/A 04/21/2014    Procedure: LEFT AND RIGHT HEART CATHETERIZATION WITH CORONARY ANGIOGRAM;  Surgeon: Burnell Blanks, MD;  Location: [facility] C[facility] LAB;  Service: Cardiovascular;  Laterality: N/A;    Family History  Problem Relation Age of Onset  . Heart disease Mother   . Kidney disease Father     History   Social History  . Marital Status: Widowed    Spouse Name: N/A    Number of Children: 2  . Years of Education: N/A   Occupational History  . retired    Social History Main Topics  . Smoking status: Never Smoker   . Smokeless tobacco: Never Used  . Alcohol Use: 0.5  oz/week    1 drink(s) per week     Comment: rare  . Drug Use: No  . Sexual Activity: Not on file   Other Topics Concern  . Not on file   Social History Narrative   Retired Conservation officer, historic buildings. Widowed.          Review of systems: The patient specifically denies any chest pain at rest or with exertion, dyspnea at rest or with exertion, orthopnea, paroxysmal nocturnal dyspnea, syncope, palpitations, focal neurological deficits except minor memory problems, intermittent claudication, lower extremity edema, unexplained weight gain, cough, hemoptysis or wheezing.  The patient also denies abdominal pain, nausea, vomiting, dysphagia, diarrhea, constipation, polyuria, polydipsia, dysuria, hematuria, frequency, urgency, abnormal bleeding or bruising, fever, chills, unexpected weight changes, mood swings, change in skin or hair texture, change in voice quality, auditory or visual problems, allergic reactions or rashes, new musculoskeletal complaints other than usual "aches and pains".   PHYSICAL EXAM BP 112/58 mmHg  Pulse 69  Resp 16  Ht 5\' 10"  (1.778 m)  Wt 154 lb 14.4 oz (70.262 kg)  BMI 22.23 kg/m2 General: Alert, oriented x3, no distress. He does not appear pale  Head: no evidence of trauma, PERRL, EOMI, no exophtalmos or lid lag, no myxedema, no xanthelasma; normal ears, nose and oropharynx  Neck: flat jugular venous pulsations and no hepatojugular reflux; brisk carotid pulses without delay and no carotid bruits  Chest: clear to auscultation, no signs of consolidation by percussion or palpation, normal fremitus, symmetrical and full respiratory excursions. Healed sternotomy scar.  Cardiovascular: normal position and quality of the apical impulse, regular rhythm, normal first and second heart sounds, no murmurs, rubs or gallops  Abdomen: no tenderness or distention, no masses by palpation, no abnormal pulsatility or arterial bruits, normal bowel sounds, no hepatosplenomegaly  Extremities:  no clubbing, cyanosis or edema; 2+ radial, ulnar and brachial pulses bilaterally; 2+ right femoral, posterior tibial and dorsalis pedis pulses; 2+ left femoral, posterior tibial and dorsalis pedis pulses; no subclavian or femoral bruits  Neurological: grossly nonfocal   EKG: Sinus rhythm with a couple of PACs, incomplete left bundle branch block, QRS duration 120 ms, QTC 473 ms, no ischemic appearing repolarization abnormalities  Lipid Panel     Component Value Date/Time   CHOL 129 08/14/2014 1013   TRIG 149 08/14/2014 1013   HDL 36* 08/14/2014 1013   CHOLHDL 3.6 08/14/2014 1013   VLDL 30 08/14/2014 1013   LDLCALC 63 08/14/2014 1013    BMET    Component Value Date/Time   NA 139 08/14/2014 1013   K 4.7 08/14/2014 1013   CL 105 08/14/2014 1013   CO2 27 08/14/2014 1013   GLUCOSE 158* 08/14/2014 1013   BUN 21 08/14/2014 1013   CREATININE 1.13 08/14/2014 1013   CREATININE 1.30 05/29/2014 1729   CALCIUM 9.3 08/14/2014 1013   GFRNONAA 46* 05/29/2014 1729   GFRNONAA 69 05/17/2014 1015   GFRAA 53* 05/29/2014 1729   GFRAA  80 05/17/2014 1015     ASSESSMENT AND PLAN  Bruce Green has continued to have remarkably successful recovery after multivessel bypass surgery despite the severity of his cardiomyopathy and his advanced age. Even though we have been unable to use renin angiotensin system inhibitors, he has had remarkable improvement in LV ejection fraction, now in the mildly decreased range. His functional status has improved and he is now essentially asymptomatic. His lipid profile is favorable. He should continue beta blockers and aspirin and high-dose statin. From a cardiac point of view I see no reason to restrict his driving, but have asked him to be very cautious and stick to familiar and short routes to begin with, preferably with family supervision  Meds ordered this encounter  Medications  . Multiple Vitamin (MULTIVITAMIN WITH MINERALS) TABS tablet    Sig: Take 1 tablet by  mouth daily.  . cholecalciferol (VITAMIN D) 1000 UNITS tablet    Sig: Take 1,000 Units by mouth daily.    Holli Humbles, MD, [place] [phone] office [phone] pager

## 2014-11-06 NOTE — Patient Instructions (Signed)
Your physician wants you to follow-up in: 6 Months You will receive a reminder letter in the mail two months in advance. If you don't receive a letter, please call our office to schedule the follow-up appointment.  

## 2014-11-21 ENCOUNTER — Telehealth: Payer: Self-pay | Admitting: Cardiovascular Disease

## 2014-11-21 NOTE — Telephone Encounter (Signed)
Pt.s daughter called and asked if pt. Needs to be on a multi vit. and an additional magnesium and calcium, pt. Instructed to take one or the other, pt.s daughter agreed with plan

## 2014-11-21 NOTE — Telephone Encounter (Signed)
Pe[facility] call,question about his medicine.

## 2014-12-28 ENCOUNTER — Encounter: Payer: Self-pay | Admitting: Cardiovascular Disease

## 2015-01-09 ENCOUNTER — Other Ambulatory Visit: Payer: Self-pay | Admitting: Cardiovascular Disease

## 2015-01-10 NOTE — Telephone Encounter (Signed)
Rx(s) sent to pharmacy electronically.  

## 2015-03-08 ENCOUNTER — Ambulatory Visit (INDEPENDENT_AMBULATORY_CARE_PROVIDER_SITE_OTHER): Payer: Medicare Other | Admitting: Internal Medicine

## 2015-03-08 ENCOUNTER — Encounter: Payer: Self-pay | Admitting: Internal Medicine

## 2015-03-08 VITALS — BP 100/48 | HR 64 | Ht 69.0 in | Wt 157.2 lb

## 2015-03-08 DIAGNOSIS — R194 Change in bowel habit: Secondary | ICD-10-CM

## 2015-03-08 DIAGNOSIS — K222 Esophageal obstruction: Secondary | ICD-10-CM

## 2015-03-08 DIAGNOSIS — K219 Gastro-esophageal reflux disease without esophagitis: Secondary | ICD-10-CM

## 2015-03-08 NOTE — Progress Notes (Signed)
Subjective:    Patient ID: Bruce Green, male    DOB: 18-Nov-1919, 81 y.o.   MRN: [id number] Chief Complaint is constipation and abdominal pain HPI Bruce Green is here with his daughter. Over the past several months he's had some constipation problems. Hard balls of stool and less frequent defecation. No bleeding reported. He's also had intermittent severe crampy abdominal pain a few times but the last was a month ago.  His main problem is distress over discovering that his caregiver of late wasn't as living from him and the police think maybe even trying to poison him though I don't have any specifics about that. She has been dismissed and there is a criminal investigation ongoing.  He feels like his constipation is better he is moving his bowels most days and they're tend to be soft and sometimes loose with MiraLAX and stool softener.  He has a history of GERD with peptic stricture which was dilated. There has been a rare episode of dysphagia or 2 but in general he has done well and he continues on his pantoprazole.  Medications, allergies, past medical history, past surgical history, family history and social history are reviewed and updated in the EMR.  Review of Systems Recent URI is recovering from and also has been little confused lately that is apparently improving    Objective:   Physical Exam BP 100/48 mmHg  Pulse 64  Ht 5\' 9"  (1.753 m)  Wt 157 lb 4 oz (71.328 kg)  BMI 23.21 kg/m2 Elderly white man in no acute distress looks younger than 45  I reviewed February primary care note, he had a normal comprehensive metabolic panel except for slightly elevated chloride, GFR 58 and glucose 114. His vitamin D was 26.7.     Assessment & Plan:   1. Change in bowel habits   Recent constipation he also had some abdominal pain. This is all improved. He could need a colonoscopy to investigate, his daughter was concerned about an brother with colon cancer but the patient really does  not want any type of workup and even declined a rectal exam today. He is 89 so less is more in this situation I think and if he has persistent problems I be happy to see him back. Situational stressors probably have some role.   2. GERD with stricture   Stable, doing well on PPI which she will continue.      Current outpatient prescriptions:  .  aspirin 81 MG tablet, Take 81 mg by mouth daily., Disp: , Rfl:  .  atorvastatin (LIPITOR) 80 MG tablet, TAKE 1 TABLET (80 MG TOTAL) BY MOUTH EVERY EVENING. (FOR HIGH CHOLESTEROL), Disp: 30 tablet, Rfl: 5 .  B Complex-C-Calcium (B-COMPLEX/VITAMIN C PO), Take 1 tablet by mouth every morning. , Disp: , Rfl:  .  bisoprolol (ZEBETA) 5 MG tablet, TAKE 1/2  TABLETS (2.5 MG TOTAL) BY MOUTH DAILY. (FOR BLOOD PRESSURE AND HEART RATE), Disp: 15 tablet, Rfl: 9 .  cholecalciferol (VITAMIN D) 1000 UNITS tablet, Take 5,000 Units by mouth once a week. Every Saturday, Disp: , Rfl:  .  docusate sodium (COLACE) 100 MG capsule, Take 100 mg by mouth daily., Disp: , Rfl:  .  folic acid (FOLVITE) 1 MG tablet, Take 1 mg by mouth 2 (two) times daily., Disp: , Rfl:  .  Multiple Vitamin (MULTIVITAMIN WITH MINERALS) TABS tablet, Take 1 tablet by mouth daily., Disp: , Rfl:  .  pantoprazole (PROTONIX) 40 MG tablet, Take 1 tablet (  40 mg total) by mouth daily before breakfast., Disp: 90 tablet, Rfl: 3 .  polyethylene glycol powder (GLYCOLAX/MIRALAX) powder, Take 17 g by mouth as needed., Disp: , Rfl:  .  vitamin B-12 (CYANOCOBALAMIN) 1000 MCG tablet, Take 1,000 mcg by mouth daily., Disp: , Rfl:    I appreciate the opportunity to care for this patient. CC: Bruce Solo, MD

## 2015-03-08 NOTE — Patient Instructions (Signed)
Follow up with Dr. Carlean Purl as needed.

## 2015-07-05 ENCOUNTER — Emergency Department (HOSPITAL_COMMUNITY): Payer: Medicare Other

## 2015-07-05 ENCOUNTER — Emergency Department (HOSPITAL_COMMUNITY)
Admission: EM | Admit: 2015-07-05 | Discharge: 2015-07-05 | Disposition: A | Discharge status: Home / Self Care | Payer: Medicare Other | Attending: Emergency Medicine | Admitting: Emergency Medicine

## 2015-07-05 ENCOUNTER — Encounter (HOSPITAL_COMMUNITY): Payer: Self-pay

## 2015-07-05 DIAGNOSIS — Y9289 Other specified places as the place of occurrence of the external cause: Secondary | ICD-10-CM | POA: Insufficient documentation

## 2015-07-05 DIAGNOSIS — Z79899 Other long term (current) drug therapy: Secondary | ICD-10-CM | POA: Diagnosis not present

## 2015-07-05 DIAGNOSIS — Z9889 Other specified postprocedural states: Secondary | ICD-10-CM | POA: Diagnosis not present

## 2015-07-05 DIAGNOSIS — K219 Gastro-esophageal reflux disease without esophagitis: Secondary | ICD-10-CM | POA: Insufficient documentation

## 2015-07-05 DIAGNOSIS — Y9389 Activity, other specified: Secondary | ICD-10-CM | POA: Diagnosis not present

## 2015-07-05 DIAGNOSIS — S60221A Contusion of right hand, initial encounter: Secondary | ICD-10-CM | POA: Diagnosis not present

## 2015-07-05 DIAGNOSIS — Z8659 Personal history of other mental and behavioral disorders: Secondary | ICD-10-CM | POA: Insufficient documentation

## 2015-07-05 DIAGNOSIS — I2510 Atherosclerotic heart disease of native coronary artery without angina pectoris: Secondary | ICD-10-CM | POA: Diagnosis not present

## 2015-07-05 DIAGNOSIS — S0121XA Laceration without foreign body of nose, initial encounter: Secondary | ICD-10-CM | POA: Insufficient documentation

## 2015-07-05 DIAGNOSIS — E785 Hyperlipidemia, unspecified: Secondary | ICD-10-CM | POA: Insufficient documentation

## 2015-07-05 DIAGNOSIS — Z951 Presence of aortocoronary bypass graft: Secondary | ICD-10-CM | POA: Diagnosis not present

## 2015-07-05 DIAGNOSIS — Z8673 Personal history of transient ischemic attack (TIA), and cerebral infarction without residual deficits: Secondary | ICD-10-CM | POA: Insufficient documentation

## 2015-07-05 DIAGNOSIS — N189 Chronic kidney disease, unspecified: Secondary | ICD-10-CM | POA: Diagnosis not present

## 2015-07-05 DIAGNOSIS — S80211A Abrasion, right knee, initial encounter: Secondary | ICD-10-CM | POA: Diagnosis not present

## 2015-07-05 DIAGNOSIS — IMO0002 Reserved for concepts with insufficient information to code with codable children: Secondary | ICD-10-CM

## 2015-07-05 DIAGNOSIS — Z7982 Long term (current) use of aspirin: Secondary | ICD-10-CM | POA: Diagnosis not present

## 2015-07-05 DIAGNOSIS — E559 Vitamin D deficiency, unspecified: Secondary | ICD-10-CM | POA: Diagnosis not present

## 2015-07-05 DIAGNOSIS — W19XXXA Unspecified fall, initial encounter: Secondary | ICD-10-CM

## 2015-07-05 DIAGNOSIS — W010XXA Fall on same level from slipping, tripping and stumbling without subsequent striking against object, initial encounter: Secondary | ICD-10-CM | POA: Diagnosis not present

## 2015-07-05 DIAGNOSIS — Z8701 Personal history of pneumonia (recurrent): Secondary | ICD-10-CM | POA: Insufficient documentation

## 2015-07-05 DIAGNOSIS — Y998 Other external cause status: Secondary | ICD-10-CM | POA: Insufficient documentation

## 2015-07-05 DIAGNOSIS — S80212A Abrasion, left knee, initial encounter: Secondary | ICD-10-CM | POA: Diagnosis not present

## 2015-07-05 DIAGNOSIS — Z86010 Personal history of colonic polyps: Secondary | ICD-10-CM | POA: Insufficient documentation

## 2015-07-05 DIAGNOSIS — S0181XA Laceration without foreign body of other part of head, initial encounter: Secondary | ICD-10-CM | POA: Diagnosis present

## 2015-07-05 MED ORDER — LIDOCAINE HCL 2 % IJ SOLN
10.0000 mL | Freq: Once | INTRAMUSCULAR | Status: AC
  Administered 2015-07-05: 200 mg via INTRADERMAL
  Filled 2015-07-05: qty 20

## 2015-07-05 MED ORDER — BACITRACIN ZINC 500 UNIT/GM EX OINT
TOPICAL_OINTMENT | Freq: Two times a day (BID) | CUTANEOUS | Status: DC
  Administered 2015-07-05: 1 via TOPICAL
  Filled 2015-07-05: qty 0.9

## 2015-07-05 NOTE — ED Notes (Signed)
Patient tripped over a roll of chicken wire and falling into the wire itself, gravel, and cement. Patient has a nose laceration. Right hand hematoma, abrasions to both knees.

## 2015-07-05 NOTE — Discharge Instructions (Signed)
Laceration Care, Adult Have sutures removed in 7 days.take Tylenol or Motrin for pain. Incidental finding of 85mm thyroid nodule. Apply ice to the hand. A laceration is a cut or lesion that goes through all layers of the skin and into the tissue just beneath the skin. TREATMENT  Some lacerations may not require closure. Some lacerations may not be able to be closed due to an increased risk of infection. It is important to see your caregiver as soon as possible after an injury to minimize the risk of infection and maximize the opportunity for successful closure. If closure is appropriate, pain medicines may be given, if needed. The wound will be cleaned to help prevent infection. Your caregiver will use stitches (sutures), staples, wound glue (adhesive), or skin adhesive strips to repair the laceration. These tools bring the skin edges together to allow for faster healing and a better cosmetic outcome. However, all wounds will heal with a scar. Once the wound has healed, scarring can be minimized by covering the wound with sunscreen during the day for 1 full year. HOME CARE INSTRUCTIONS  For sutures or staples:  Keep the wound clean and dry.  If you were given a bandage (dressing), you should change it at least once a day. Also, change the dressing if it becomes wet or dirty, or as directed by your caregiver.  Wash the wound with soap and water 2 times a day. Rinse the wound off with water to remove all soap. Pat the wound dry with a clean towel.  After cleaning, apply a thin layer of the antibiotic ointment as recommended by your caregiver. This will help prevent infection and keep the dressing from sticking.  You may shower as usual after the first 24 hours. Do not soak the wound in water until the sutures are removed.  Only take over-the-counter or prescription medicines for pain, discomfort, or fever as directed by your caregiver.  Get your sutures or staples removed as directed by your  caregiver. For skin adhesive strips:  Keep the wound clean and dry.  Do not get the skin adhesive strips wet. You may bathe carefully, using caution to keep the wound dry.  If the wound gets wet, pat it dry with a clean towel.  Skin adhesive strips will fall off on their own. You may trim the strips as the wound heals. Do not remove skin adhesive strips that are still stuck to the wound. They will fall off in time. For wound adhesive:  You may briefly wet your wound in the shower or bath. Do not soak or scrub the wound. Do not swim. Avoid periods of heavy perspiration until the skin adhesive has fallen off on its own. After showering or bathing, gently pat the wound dry with a clean towel.  Do not apply liquid medicine, cream medicine, or ointment medicine to your wound while the skin adhesive is in place. This may loosen the film before your wound is healed.  If a dressing is placed over the wound, be careful not to apply tape directly over the skin adhesive. This may cause the adhesive to be pulled off before the wound is healed.  Avoid prolonged exposure to sunlight or tanning lamps while the skin adhesive is in place. Exposure to ultraviolet light in the first year will darken the scar.  The skin adhesive will usually remain in place for 5 to 10 days, then naturally fall off the skin. Do not pick at the adhesive film. You may need  a tetanus shot if:  You cannot remember when you had your last tetanus shot.  You have never had a tetanus shot. If you get a tetanus shot, your arm may swell, get red, and feel warm to the touch. This is common and not a problem. If you need a tetanus shot and you choose not to have one, there is a rare chance of getting tetanus. Sickness from tetanus can be serious. SEEK MEDICAL CARE IF:   You have redness, swelling, or increasing pain in the wound.  You see a red line that goes away from the wound.  You have yellowish-white fluid (pus) coming from  the wound.  You have a fever.  You notice a bad smell coming from the wound or dressing.  Your wound breaks open before or after sutures have been removed.  You notice something coming out of the wound such as wood or glass.  Your wound is on your hand or foot and you cannot move a finger or toe. SEEK IMMEDIATE MEDICAL CARE IF:   Your pain is not controlled with prescribed medicine.  You have severe swelling around the wound causing pain and numbness or a change in color in your arm, hand, leg, or foot.  Your wound splits open and starts bleeding.  You have worsening numbness, weakness, or loss of function of any joint around or beyond the wound.  You develop painful lumps near the wound or on the skin anywhere on your body. MAKE SURE YOU:   Understand these instructions.  Will watch your condition.  Will get help right away if you are not doing well or get worse. Document Released: 11/10/2005 Document Revised: 02/02/2012 Document Reviewed: 05/06/2011 [facility] Patient Information 2015 [place], [place]. This information is not intended to replace advice given to you by your health care provider. Make sure you discuss any questions you have with your health care provider.

## 2015-07-05 NOTE — ED Provider Notes (Signed)
CSN: [id number]     Arrival date & time 07/05/15  1122 History   First MD Initiated Contact with Patient 07/05/15 1124     Chief Complaint  Patient presents with  . Fall  . Facial Laceration  . hand swelling      (Consider location/radiation/quality/duration/timing/severity/associated sxs/prior Treatment) Patient is a 81 y.o. male presenting with fall. The history is provided by the patient. No language interpreter was used.  Fall Pertinent negatives include no headaches.  Mr. Kanady is a 81 y.o male with a history of coronary artery disease, dyslipidemia, TIA who presents for trip and fall over fencing just prior to arrival. He denies any loss of consciousness. He hit the concrete with his head, right arm, and knees. He was able to get up after the incident.he denies any dizziness, vision changes, headache. He takes 81 mg of aspirin daily.He denies being in any pain. Tetanus up-to-date. Past Medical History  Diagnosis Date  . Stricture esophagus     distal  . GERD (gastroesophageal reflux disease)   . Hiatal hernia     6cm  . Coronary artery disease 04/21/2014      severe triple vessel  . Ischemic cardiomyopathy 03/2014    EF 25-30%  . Anxiety   . Pneumonia   . Colon polyp   . Dyslipidemia   . TIA (transient ischemic attack)   . Cataract   . Vitamin D deficiency   . CKD (chronic kidney disease)    Past Surgical History  Procedure Laterality Date  . Cholecystectomy    . Shoulder surgery    . Appendectomy    . Esophagogastroduodenoscopy  04/09/2012    Procedure: ESOPHAGOGASTRODUODENOSCOPY (EGD);  Surgeon: Gatha Mayer, MD;  Location: [facility];  Service: Endoscopy;  Laterality: N/A;  . Coronary artery bypass graft N/A 04/24/2014    Procedure: CORONARY ARTERY BYPASS GRAFTING (CABG);  Surgeon: Gaye Pollack, MD;  Location: [facility];  Service: Open Heart Surgery;  Laterality: N/A;  Times 3 using left internal mammary artery and endoscopically harvested right saphenous vein  .  Intraoperative transesophageal echocardiogram N/A 04/24/2014    Procedure: INTRAOPERATIVE TRANSESOPHAGEAL ECHOCARDIOGRAM;  Surgeon: Gaye Pollack, MD;  Location: [facility] OR;  Service: Open Heart Surgery;  Laterality: N/A;  . Left and right heart catheterization with coronary angiogram N/A 04/21/2014    Procedure: LEFT AND RIGHT HEART CATHETERIZATION WITH CORONARY ANGIOGRAM;  Surgeon: Burnell Blanks, MD;  Location: [facility] CATH LAB;  Service: Cardiovascular;  Laterality: N/A;  . Cardiac surgery     Family History  Problem Relation Age of Onset  . Heart attack Mother   . Kidney disease Father   . Colon cancer Brother   . Dementia Brother   . Aneurysm Brother   . Breast cancer Sister     x 2  . Arthritis Sister   . Osteoporosis Sister    Social History  Substance Use Topics  . Smoking status: Never Smoker   . Smokeless tobacco: Never Used  . Alcohol Use: 0.5 oz/week    1 Standard drinks or equivalent per week     Comment: rare    Review of Systems  HENT: Negative for nosebleeds.   Eyes: Negative for visual disturbance.  Respiratory: Negative for shortness of breath.   Skin: Positive for color change and wound.  Neurological: Negative for dizziness, syncope and headaches.  All other systems reviewed and are negative.     Allergies  Sulfa antibiotics  Home Medications   Prior  to Admission medications   Medication Sig Start Date End Date Taking? Authorizing Provider  aspirin 81 MG tablet Take 81 mg by mouth daily.   Yes Historical Provider, MD  atorvastatin (LIPITOR) 80 MG tablet TAKE 1 TABLET (80 MG TOTAL) BY MOUTH EVERY EVENING. (FOR HIGH CHOLESTEROL) 09/28/14  Yes Mihai Croitoru, MD  B Complex-C-Calcium (B-COMPLEX/VITAMIN C PO) Take 1 tablet by mouth every morning.    Yes Historical Provider, MD  bisoprolol (ZEBETA) 5 MG tablet TAKE 1/2  TABLETS (2.5 MG TOTAL) BY MOUTH DAILY. (FOR BLOOD PRESSURE AND HEART RATE) Patient taking differently: TAKE 2.5 MG BY MOUTH DAILY FOR BLOOD  PRESSURE AND HEART RATE 01/10/15  Yes Mihai Croitoru, MD  cholecalciferol (VITAMIN D) 1000 UNITS tablet Take 5,000 Units by mouth once a week. Every Saturday   Yes Historical Provider, MD  docusate sodium (COLACE) 100 MG capsule Take 100 mg by mouth daily.   Yes Historical Provider, MD  folic acid (FOLVITE) 1 MG tablet Take 1 mg by mouth 2 (two) times daily. 04/28/14  Yes Donielle Liston Alba, PA-C  naproxen sodium (ANAPROX) 220 MG tablet Take 440 mg by mouth daily as needed (pain).   Yes Historical Provider, MD  pantoprazole (PROTONIX) 40 MG tablet Take 1 tablet (40 mg total) by mouth daily before breakfast. 09/01/14  Yes Gatha Mayer, MD  vitamin B-12 (CYANOCOBALAMIN) 1000 MCG tablet Take 1,000 mcg by mouth daily.   Yes Historical Provider, MD   BP 156/78 mmHg  Pulse 68  Temp(Src) 98.1 F (36.7 C) (Oral)  Resp 20  Ht 5\' 10"  (1.778 m)  Wt 155 lb (70.308 kg)  BMI 22.24 kg/m2  SpO2 97% Physical Exam  Constitutional: He is oriented to person, place, and time. He appears well-developed and well-nourished.  HENT:  Head: Normocephalic. Head is without raccoon's eyes, without Battle's sign, without abrasion, without contusion, without laceration, without right periorbital erythema and without left periorbital erythema. Hair is normal.  1 cm laceration over the nasal bridge. No active bleeding.  Abrasion below the chin.  Eyes: Conjunctivae are normal.  Neck: Normal range of motion. Neck supple.  Cardiovascular: Normal rate.   Pulmonary/Chest: Effort normal.  Abdominal: Soft.  Musculoskeletal: Normal range of motion.  Bilateral knees:Ambulatory with steady gait. Able to flex and extend the knees. Able to straight leg raise. No patellar or fibular head tenderness.  Right hand: Able to flex and extend all fingers. 2+ radial pulse. Large right dorsum of the hand hematoma.  Neurological: He is alert and oriented to person, place, and time.  Skin: Skin is warm and dry.  Abrasions to bilateral  knees.  Right hand hematoma.  Psychiatric: He has a normal mood and affect. His behavior is normal.  Nursing note and vitals reviewed.   ED Course  Procedures (including critical care time) Labs Review Labs Reviewed - No data to display LACERATION REPAIR Performed by: Ottie Glazier Authorized by: Ottie Glazier Consent: Verbal consent obtained. Risks and benefits: risks, benefits and alternatives were discussed Consent given by: patient Patient identity confirmed: provided demographic data Prepped and Draped in normal sterile fashion Wound explored Laceration Location: nasal bridge Laceration Length: 1cm No Foreign Bodies seen or palpated Anesthesia: local infiltration Local anesthetic: lidocaine 2% without epinephrine Anesthetic total: 1 ml Irrigation method: syringe Amount of cleaning: standard Skin closure: 5-0 proline Number of sutures: 3 Technique: simple interrupted Patient tolerance: Patient tolerated the procedure well with no immediate complications.  Imaging Review Ct Head Wo Contrast  07/05/2015  CLINICAL DATA:  tripped over a roll of chicken wire and falling into the wire itself, gravel, and cement. Patient has a nose laceration. Right hand hematoma, abrasions to both knees.  EXAM: CT HEAD WITHOUT CONTRAST  CT CERVICAL SPINE WITHOUT CONTRAST  TECHNIQUE: Multidetector CT imaging of the head and cervical spine was performed following the standard protocol without intravenous contrast. Multiplanar CT image reconstructions of the cervical spine were also generated.  COMPARISON:  None.  FINDINGS: CT HEAD FINDINGS  Fractures of the tip of the nose and the right nasal bone mildly displaced. No other fractures identified. No evidence of hemorrhage or extra-axial fluid. No infarct mass or hydrocephalus. Significant diffuse age-related atrophy identified with low attenuation in the deep white matter consistent with chronic small vessel ischemic change.  CT CERVICAL  SPINE FINDINGS  Heavy calcification of the coronary arteries. No evidence of cervical spine fracture. Mild to moderate degenerative disc disease throughout the cervical spine. C3-4 calcified disc bulge causing mild to moderate canal narrowing. Mild calcified bulge at C4-5 C5-6 and C6-7 as well.  Normal alignment. No prevertebral soft tissue swelling. Lung apices clear. Partially visualized nodule left thyroid measures at least 15 mm.  IMPRESSION: 1. Nasal bone fractures.  No acute intracranial abnormality. 2. No cervical spine fracture 3. Incidentally detected thyroid nodule measuring at least 15 mm. CT scan performed 04/06/2014 does not evaluate the thyroid as the thyroid is obscured by attenuation artifact from dense contrast. Therefore, nodule is of uncertain significance and thyroid ultrasound could be considered to evaluate further.   Electronically Signed   By: Skipper Cliche M.D.   On: 07/05/2015 13:08   Ct Cervical Spine Wo Contrast  07/05/2015   CLINICAL DATA:  tripped over a roll of chicken wire and falling into the wire itself, gravel, and cement. Patient has a nose laceration. Right hand hematoma, abrasions to both knees.  EXAM: CT HEAD WITHOUT CONTRAST  CT CERVICAL SPINE WITHOUT CONTRAST  TECHNIQUE: Multidetector CT imaging of the head and cervical spine was performed following the standard protocol without intravenous contrast. Multiplanar CT image reconstructions of the cervical spine were also generated.  COMPARISON:  None.  FINDINGS: CT HEAD FINDINGS  Fractures of the tip of the nose and the right nasal bone mildly displaced. No other fractures identified. No evidence of hemorrhage or extra-axial fluid. No infarct mass or hydrocephalus. Significant diffuse age-related atrophy identified with low attenuation in the deep white matter consistent with chronic small vessel ischemic change.  CT CERVICAL SPINE FINDINGS  Heavy calcification of the coronary arteries. No evidence of cervical spine  fracture. Mild to moderate degenerative disc disease throughout the cervical spine. C3-4 calcified disc bulge causing mild to moderate canal narrowing. Mild calcified bulge at C4-5 C5-6 and C6-7 as well.  Normal alignment. No prevertebral soft tissue swelling. Lung apices clear. Partially visualized nodule left thyroid measures at least 15 mm.  IMPRESSION: 1. Nasal bone fractures.  No acute intracranial abnormality. 2. No cervical spine fracture 3. Incidentally detected thyroid nodule measuring at least 15 mm. CT scan performed 04/06/2014 does not evaluate the thyroid as the thyroid is obscured by attenuation artifact from dense contrast. Therefore, nodule is of uncertain significance and thyroid ultrasound could be considered to evaluate further.   Electronically Signed   By: Skipper Cliche M.D.   On: 07/05/2015 13:08   Dg Hand Complete Right  07/05/2015   CLINICAL DATA:  Pt fell face down onto right hand this morning (07/05/15) while working with chicken  wire in his concrete driveway. No prior injury or surgery Pt states no pain in his right hand, but obvious bruising from right wrist to PIPsPt unable to extend his 3rd digit for AP  EXAM: RIGHT HAND - COMPLETE 3+ VIEW  COMPARISON:  None.  FINDINGS: No fracture or dislocation.  Mild arthropathic changes with asymmetric joint space narrowing and small marginal osteophytes involving DIP joints, mostly the second and third, the fifth PIP joint and the third and fourth MCP joints. This is most likely osteoarthritis.  Bones are demineralized.  There is dorsal soft tissue swelling.  No radiopaque foreign body.  IMPRESSION: No fracture or dislocation.   Electronically Signed   By: Lajean Manes M.D.   On: 07/05/2015 14:05     EKG Interpretation None      MDM   Final diagnoses:  Fall, initial encounter  Laceration  Traumatic hematoma of hand, right, initial encounter   Patient refused pain medicine. Vital stable. I discussed having sutures removed in 7  days as well as incidental thyroid nodule. Daughter states that he has follow-up with a primary care physician.I gave him return precautions and patient verbally agreed with the plan.     Ottie Glazier, PA-C 07/05/15 [place] Liu, MD 07/05/15 [phone]

## 2015-07-22 ENCOUNTER — Other Ambulatory Visit: Payer: Self-pay | Admitting: Internal Medicine

## 2015-07-23 ENCOUNTER — Other Ambulatory Visit: Payer: Self-pay | Admitting: Cardiovascular Disease

## 2015-07-23 NOTE — Telephone Encounter (Signed)
REFILL 

## 2015-09-19 ENCOUNTER — Other Ambulatory Visit: Payer: Self-pay | Admitting: Cardiovascular Disease

## 2015-11-05 ENCOUNTER — Other Ambulatory Visit: Payer: Self-pay | Admitting: Cardiovascular Disease

## 2015-12-02 ENCOUNTER — Other Ambulatory Visit: Payer: Self-pay | Admitting: Cardiovascular Disease

## 2015-12-03 ENCOUNTER — Other Ambulatory Visit: Payer: Self-pay | Admitting: *Deleted

## 2015-12-03 MED ORDER — BISOPROLOL FUMARATE 5 MG PO TABS: ORAL_TABLET | ORAL | Status: DC

## 2016-01-25 ENCOUNTER — Encounter: Payer: Self-pay | Admitting: Cardiovascular Disease

## 2016-01-25 ENCOUNTER — Ambulatory Visit (INDEPENDENT_AMBULATORY_CARE_PROVIDER_SITE_OTHER): Payer: Medicare Other | Admitting: Cardiovascular Disease

## 2016-01-25 VITALS — HR 73 | Ht 70.0 in | Wt 156.2 lb

## 2016-01-25 DIAGNOSIS — I951 Orthostatic hypotension: Secondary | ICD-10-CM

## 2016-01-25 DIAGNOSIS — I2510 Atherosclerotic heart disease of native coronary artery without angina pectoris: Secondary | ICD-10-CM | POA: Diagnosis not present

## 2016-01-25 DIAGNOSIS — Z951 Presence of aortocoronary bypass graft: Secondary | ICD-10-CM

## 2016-01-25 DIAGNOSIS — I255 Ischemic cardiomyopathy: Secondary | ICD-10-CM | POA: Diagnosis not present

## 2016-01-25 DIAGNOSIS — I5020 Unspecified systolic (congestive) heart failure: Secondary | ICD-10-CM

## 2016-01-25 NOTE — Progress Notes (Signed)
Patient ID: Bruce Green, male   DOB: 08/03/1919, 82 y.o.   MRN: [id number]    Cardiology Office Note    Date:  01/27/2016   ID:  Bruce Green, DOB Apr 22, 1919, MRN [id number]  PCP:  Tawanna Solo, MD  Cardiologist:   Sanda Klein, MD   Chief Complaint  Patient presents with  . Annual Exam    patient and daughter reports symptoms of dizziness and hypotension. he was evaluated by his PCP    History of Present Illness:  Bruce Green is a 82 y.o. male with coronary artery disease roughly 2 years after multivessel bypass surgery which led to marked improvement in left ventricular systolic function (EF 123456 percent increased to about 45%), but with difficulty in returning to previous functional status. He is primarily limited by severe dizziness in a pattern strongly suggestive of orthostatic hypotension. He is dizzy every morning he gets out of bed. He has had at least 3 episodes where he has stumbled and fallen, thankfully without serious injury. He feels tired all the time and falls asleep during the day sometimes. He develops dyspnea when he walks approximately 100 feet. He has become quite uncertain of his reflexes and has stopped driving.  It appears that he limits the intake of fluid in the second half of the day since he does not like to urinate frequently at night. His only hemodynamically active medications are a tiny dose of bisoprolol and isosorbide dinitrate. He has actually not taken the latter medication, even though it was recently prescribed on a "as needed" basis.  He has not had angina pectoris and denies any true focal neurological deficits, other than the dizziness.  He has not had any bleeding problems, change in bowel pattern, abdominal pain, fever, chills, cough, hemoptysis, change in vision or auditory acuity, intolerance to heat or cold, sweats, flushing, insomnia, mood swings.    Past Medical History  Diagnosis Date  . Stricture esophagus     distal    . GERD (gastroesophageal reflux disease)   . Hiatal hernia     6cm  . Coronary artery disease 04/21/2014      severe triple vessel  . Ischemic cardiomyopathy 03/2014    EF 25-30%  . Anxiety   . Pneumonia   . Colon polyp   . Dyslipidemia   . TIA (transient ischemic attack)   . Cataract   . Vitamin D deficiency   . CKD (chronic kidney disease)     Past Surgical History  Procedure Laterality Date  . Cholecystectomy    . Shoulder surgery    . Appendectomy    . Esophagogastroduodenoscopy  04/09/2012    Procedure: ESOPHAGOGASTRODUODENOSCOPY (EGD);  Surgeon: Gatha Mayer, MD;  Location: [facility];  Service: Endoscopy;  Laterality: N/A;  . Coronary artery bypass graft N/A 04/24/2014    Procedure: CORONARY ARTERY BYPASS GRAFTING (CABG);  Surgeon: Gaye Pollack, MD;  Location: [place];  Service: Open Heart Surgery;  Laterality: N/A;  Times 3 using left internal mammary artery and endoscopically harvested right saphenous vein  . Intraoperative transesophageal echocardiogram N/A 04/24/2014    Procedure: INTRAOPERATIVE TRANSESOPHAGEAL ECHOCARDIOGRAM;  Surgeon: Gaye Pollack, MD;  Location: [facility] OR;  Service: Open Heart Surgery;  Laterality: N/A;  . Left and right heart catheterization with coronary angiogram N/A 04/21/2014    Procedure: LEFT AND RIGHT HEART CATHETERIZATION WITH CORONARY ANGIOGRAM;  Surgeon: Burnell Blanks, MD;  Location: [facility] CATH LAB;  Service: Cardiovascular;  Laterality: N/A;  .  Cardiac surgery      Outpatient Prescriptions Prior to Visit  Medication Sig Dispense Refill  . aspirin 81 MG tablet Take 81 mg by mouth daily.    Marland Kitchen atorvastatin (LIPITOR) 80 MG tablet TAKE 1 TABLET (80 MG TOTAL) BY MOUTH EVERY EVENING. (FOR HIGH CHOLESTEROL)  --- NEED OFFICE VISIT FOR REFILLS 30 tablet 0  . B Complex-C-Calcium (B-COMPLEX/VITAMIN C PO) Take 1 tablet by mouth every morning.     . cholecalciferol (VITAMIN D) 1000 UNITS tablet Take 5,000 Units by mouth once a week. Every Saturday     . docusate sodium (COLACE) 100 MG capsule Take 100 mg by mouth daily.    . folic acid (FOLVITE) 1 MG tablet Take 1 mg by mouth 2 (two) times daily.    . naproxen sodium (ANAPROX) 220 MG tablet Take 440 mg by mouth daily as needed (pain).    . pantoprazole (PROTONIX) 40 MG tablet TAKE 1 TABLET (40 MG TOTAL) BY MOUTH DAILY BEFORE BREAKFAST. 90 tablet 2  . vitamin B-12 (CYANOCOBALAMIN) 1000 MCG tablet Take 1,000 mcg by mouth daily.    . bisoprolol (ZEBETA) 5 MG tablet TAKE 1/2  TABLETS (2.5 MG TOTAL) BY MOUTH DAILY. (FOR BLOOD PRESSURE AND HEART RATE) 15 tablet 9   No facility-administered medications prior to visit.     Allergies:   Sulfa antibiotics   Social History   Social History  . Marital Status: Widowed    Spouse Name: N/A  . Number of Children: 2  . Years of Education: N/A   Occupational History  . retired    Social History Main Topics  . Smoking status: Never Smoker   . Smokeless tobacco: Never Used  . Alcohol Use: 0.6 oz/week    1 Standard drinks or equivalent per week     Comment: rare  . Drug Use: No  . Sexual Activity: Not Asked   Other Topics Concern  . None   Social History Narrative   Retired Conservation officer, historic buildings. Widowed.           Family History:  The patient's family history includes Aneurysm in his brother; Arthritis in his sister; Breast cancer in his sister; Colon cancer in his brother; Dementia in his brother; Heart attack in his mother; Kidney disease in his father; Osteoporosis in his sister.   ROS:   Please see the history of present illness.    ROS All other systems reviewed and are negative.   PHYSICAL EXAM:   VS:  Pulse 73  Ht 5\' 10"  (1.778 m)  Wt 70.852 kg (156 lb 3.2 oz)  BMI 22.41 kg/m2   GEN: Well nourished, well developed, in no acute distress HEENT: normal Neck: no JVD, carotid bruits, or masses Cardiac: Occasional ectopy on a background of RRR; no murmurs, rubs, or gallops,no edema  Respiratory:  clear to auscultation bilaterally,  normal work of breathing GI: soft, nontender, nondistended, + BS MS: no deformity or atrophy Skin: warm and dry, no rash Neuro:  Alert and Oriented x 3, Strength and sensation are intact Psych: euthymic mood, full affect  Wt Readings from Last 3 Encounters:  01/25/16 70.852 kg (156 lb 3.2 oz)  07/05/15 70.308 kg (155 lb)  03/08/15 71.328 kg (157 lb 4 oz)      Studies/Labs Reviewed:   EKG:  EKG is ordered today.  The ekg ordered today demonstrates Normal sinus rhythm with premature ventricular complexes in a pattern of trigeminy  Recent Labs: No results found for requested labs within last  365 days.   Lipid Panel    Component Value Date/Time   CHOL 129 08/14/2014 1013   TRIG 149 08/14/2014 1013   HDL 36* 08/14/2014 1013   CHOLHDL 3.6 08/14/2014 1013   VLDL 30 08/14/2014 1013   LDLCALC 63 08/14/2014 1013     ASSESSMENT:    1. Atherosclerosis of native coronary artery of native heart without angina pectoris   2. S/P CABG (coronary artery bypass graft)   3. Cardiomyopathy, ischemic   4. Systolic CHF with reduced left ventricular function, NYHA class 2 ([place])   5. Orthostatic hypotension      PLAN:  In order of problems listed above:  1. CAD: Asymptomatic 2. S/p CABG 3. CMP: Mildly depressed left ventricular systolic function, most recent assessment EF 45-50% in September 2015. Intolerant of ace inhibitors/angiotensin receptor blockers due to hypotension. I think we will have to stop his beta blocker as well for the same reason. He is not receiving diuretics and I think these would definitely worsen his dizziness. 4. CHF: Functional class I-II. Although he does experience some exertional dyspnea, clearly the orthostatic dizziness is the more serious complaint. 5. Orthostatic hypotension: We'll discontinue the beta blocker and the isosorbide. Recommend compression stockings. He should drink more fluid especially towards the end of the day when he is actively avoiding  drinking. Stand up very gradually and carefully. Would like to avoid vasoconstrictor agents such as midodrine and droxidopa, if possible. Would definitely not administer fludrocortisone since this would likely worsen heart failure.    Medication Adjustments/Labs and Tests Ordered: Current medicines are reviewed at length with the patient today.  Concerns regarding medicines are outlined above.  Medication changes, Labs and Tests ordered today are listed in the Patient Instructions below. Patient Instructions  Your physician has recommended you make the following change in your medication:   STOP BISOPROLOL AND ISOSORBIDE   DR. Sallyanne Kuster RECOMMENDS YOU START WEARING  THIGH HIGH COMPRESSION STOCKINGS.     Dr. Sallyanne Kuster recommends that you schedule a follow-up appointment in: 2 MONTHS         Signed, Catcher Dehoyos, MD  01/27/2016 1:38 PM    [facility] [place], [place], [place]  [address] Phone: [phone]; Fax: [phone]

## 2016-01-25 NOTE — Patient Instructions (Signed)
Your physician has recommended you make the following change in your medication:   STOP BISOPROLOL AND ISOSORBIDE   DR. Sallyanne Kuster RECOMMENDS YOU START WEARING  THIGH HIGH COMPRESSION STOCKINGS.     Dr. Sallyanne Kuster recommends that you schedule a follow-up appointment in: 2 MONTHS

## 2016-02-07 ENCOUNTER — Other Ambulatory Visit: Payer: Self-pay | Admitting: Cardiovascular Disease

## 2016-02-07 NOTE — Telephone Encounter (Signed)
REFILL 

## 2016-03-25 NOTE — Progress Notes (Signed)
Patient ID: Bruce Green, male   DOB: 23-Jun-1919, 82 y.o.   MRN: [id number] Patient ID: Bruce Green, male   DOB: 22-Dec-1918, 82 y.o.   MRN: [id number]    Cardiology Office Note    Date:  03/26/2016   ID:  Smoaks Sink, DOB July 05, 1919, MRN [id number]  PCP:  Tawanna Solo, MD  Cardiologist:   Sanda Klein, MD   Chief Complaint  Patient presents with  . Follow-up    no Chest pain, has shortness of breath, edema-wears compression stockings,occassional pain or cramping in legs,has lightheaded & dizziness severely    History of Present Illness:  Bruce Green is a 82 y.o. male with coronary artery disease roughly 2 years after multivessel bypass surgery which led to marked improvement in left ventricular systolic function (EF 123456 percent increased to about 45%), but with difficulty in returning to previous functional status. He is primarily limited by severe dizziness in a pattern strongly suggestive of orthostatic hypotension. He is dizzy every morning he gets out of bed. Since his last appointment in early March he has not had any new falls. He staggers when he walks. He will have episodes of severe weakness and turns pale after he has been standing up for a while. He develops dyspnea when he walks approximately 50-100 yards, but then "gets a second wind". He has stopped driving. He has been wearing his compression stockings inconsistently.  It appears that he limits the intake of fluid in the second half of the day since he does not like to urinate frequently at night. We stopped all hemodynamically active medications at his last office appointment.  He has not had angina pectoris and denies any true focal neurological deficits, other than the dizziness.  He has not had any bleeding problems, change in bowel pattern, abdominal pain, fever, chills, cough, hemoptysis, change in vision or auditory acuity, intolerance to heat or cold, sweats, flushing, insomnia, mood  swings.    Past Medical History  Diagnosis Date  . Stricture esophagus     distal  . GERD (gastroesophageal reflux disease)   . Hiatal hernia     6cm  . Coronary artery disease 04/21/2014      severe triple vessel  . Ischemic cardiomyopathy 03/2014    EF 25-30%  . Anxiety   . Pneumonia   . Colon polyp   . Dyslipidemia   . TIA (transient ischemic attack)   . Cataract   . Vitamin D deficiency   . CKD (chronic kidney disease)     Past Surgical History  Procedure Laterality Date  . Cholecystectomy    . Shoulder surgery    . Appendectomy    . Esophagogastroduodenoscopy  04/09/2012    Procedure: ESOPHAGOGASTRODUODENOSCOPY (EGD);  Surgeon: Gatha Mayer, MD;  Location: [facility];  Service: Endoscopy;  Laterality: N/A;  . Coronary artery bypass graft N/A 04/24/2014    Procedure: CORONARY ARTERY BYPASS GRAFTING (CABG);  Surgeon: Gaye Pollack, MD;  Location: [place];  Service: Open Heart Surgery;  Laterality: N/A;  Times 3 using left internal mammary artery and endoscopically harvested right saphenous vein  . Intraoperative transesophageal echocardiogram N/A 04/24/2014    Procedure: INTRAOPERATIVE TRANSESOPHAGEAL ECHOCARDIOGRAM;  Surgeon: Gaye Pollack, MD;  Location: [facility] OR;  Service: Open Heart Surgery;  Laterality: N/A;  . Left and right heart catheterization with coronary angiogram N/A 04/21/2014    Procedure: LEFT AND RIGHT HEART CATHETERIZATION WITH CORONARY ANGIOGRAM;  Surgeon: Burnell Blanks, MD;  Location: [facility]  CATH LAB;  Service: Cardiovascular;  Laterality: N/A;  . Cardiac surgery      Outpatient Prescriptions Prior to Visit  Medication Sig Dispense Refill  . aspirin 81 MG tablet Take 81 mg by mouth daily.    Marland Kitchen docusate sodium (COLACE) 100 MG capsule Take 100 mg by mouth daily.    . folic acid (FOLVITE) 1 MG tablet Take 1 mg by mouth 2 (two) times daily.    Marland Kitchen loratadine (CLARITIN) 10 MG tablet Take 1 tablet by mouth daily.    . naproxen sodium (ANAPROX) 220 MG tablet  Take 440 mg by mouth daily as needed (pain).    . pantoprazole (PROTONIX) 40 MG tablet TAKE 1 TABLET (40 MG TOTAL) BY MOUTH DAILY BEFORE BREAKFAST. 90 tablet 2  . atorvastatin (LIPITOR) 80 MG tablet TAKE 1 TABLET (80 MG TOTAL) BY MOUTH EVERY EVENING. --- NEED OFFICE VISIT FOR REFILLS (Patient not taking: Reported on 03/26/2016) 30 tablet 3  . B Complex-C-Calcium (B-COMPLEX/VITAMIN C PO) Take 1 tablet by mouth every morning.     . cholecalciferol (VITAMIN D) 1000 UNITS tablet Take 5,000 Units by mouth once a week. Reported on 03/26/2016    . Multiple Vitamins-Minerals (THERA-M) TABS Take 1 tablet by mouth daily. Reported on 03/26/2016    . vitamin B-12 (CYANOCOBALAMIN) 1000 MCG tablet Take 1,000 mcg by mouth daily. Reported on 03/26/2016     No facility-administered medications prior to visit.     Allergies:   Sulfa antibiotics   Social History   Social History  . Marital Status: Widowed    Spouse Name: N/A  . Number of Children: 2  . Years of Education: N/A   Occupational History  . retired    Social History Main Topics  . Smoking status: Never Smoker   . Smokeless tobacco: Never Used  . Alcohol Use: 0.6 oz/week    1 Standard drinks or equivalent per week     Comment: rare  . Drug Use: No  . Sexual Activity: Not Asked   Other Topics Concern  . None   Social History Narrative   Retired Conservation officer, historic buildings. Widowed.           Family History:  The patient's family history includes Aneurysm in his brother; Arthritis in his sister; Breast cancer in his sister; Colon cancer in his brother; Dementia in his brother; Heart attack in his mother; Kidney disease in his father; Osteoporosis in his sister.   ROS:   Please see the history of present illness.    ROS All other systems reviewed and are negative.   PHYSICAL EXAM:   VS:  BP 84/54 mmHg  Pulse 86  Ht 5\' 10"  (1.778 m)  Wt 69.854 kg (154 lb)  BMI 22.10 kg/m2   Orthostatic BP: - Lying 150/76 mmHg - Sitting 100/70 mmHg - Standing  85/50 mmHg  GEN: Well nourished, well developed, in no acute distress HEENT: normal Neck: no JVD, carotid bruits, or masses Cardiac: Occasional ectopy on a background of RRR; no murmurs, rubs, or gallops,no edema  Respiratory:  clear to auscultation bilaterally, normal work of breathing GI: soft, nontender, nondistended, + BS MS: no deformity or atrophy Skin: warm and dry, no rash Neuro:  Alert and Oriented x 3, Strength and sensation are intact Psych: euthymic mood, full affect  Wt Readings from Last 3 Encounters:  03/26/16 69.854 kg (154 lb)  01/25/16 70.852 kg (156 lb 3.2 oz)  07/05/15 70.308 kg (155 lb)      Studies/Labs Reviewed:  EKG:  EKG is ordered today.  The ekg ordered today demonstrates Normal sinus rhythm with premature ventricular complexes in a pattern of trigeminy  Recent Labs: No results found for requested labs within last 365 days.   Lipid Panel    Component Value Date/Time   CHOL 129 08/14/2014 1013   TRIG 149 08/14/2014 1013   HDL 36* 08/14/2014 1013   CHOLHDL 3.6 08/14/2014 1013   VLDL 30 08/14/2014 1013   LDLCALC 63 08/14/2014 1013     ASSESSMENT:    1. Atherosclerosis of native coronary artery of native heart without angina pectoris   2. Systolic CHF with reduced left ventricular function, NYHA class 2 ([place])   3. Orthostatic hypotension      PLAN:  In order of problems listed above:  1. CAD s/p CABG: Asymptomatic 2. CHF: Mildly depressed left ventricular systolic function, most recent assessment EF 45-50% in September 2015. Intolerant of ace inhibitors/angiotensin receptor blockers/beta blockers due to hypotension. He is not receiving diuretics and I think these would definitely worsen his dizziness. Functional class I-II. Although he does experience some exertional dyspnea, clearly the orthostatic dizziness is the more serious complaint. 3. Orthostatic hypotension: All hemodynamically active medications have been stopped. He appears to  have fairly severe autonomic dysfunction. Recommend always wearing his compression stockings when planning to be upright. He should drink more fluid especially towards the end of the day (when he is actively avoiding drinking). Stand up very gradually and carefully. Would avoid salt loading, definitely not administer fludrocortisone since this would likely worsen heart failure. We'll try ProAmatine 2.5 mg taken in the morning only. He is carefully instructed not to lie down for 4 hours after taking this medication. May need to titrate the dose up and may need to increase the frequency to twice daily, but will try to see how he responds first. Reevaluate response to treatment in roughly 1 month.    Medication Adjustments/Labs and Tests Ordered: Current medicines are reviewed at length with the patient today.  Concerns regarding medicines are outlined above.  Medication changes, Labs and Tests ordered today are listed in the Patient Instructions below. Patient Instructions  Dr Sallyanne Kuster has recommended making the following medication changes: 1. START Midodrine 2.5 mg - take 1 tablet when you wake up DO NOT LIE DOWN FOR 4 HOURS AFTER TAKING  Dr Sallyanne Kuster recommends that you schedule a follow-up appointment in 1 month.  If you need a refill on your cardiac medications before your next appointment, please call your pharmacy.       Mikael Spray, MD  03/26/2016 10:16 AM    [facility] [place], [place], [place]  [address] Phone: [phone]; Fax: [phone]

## 2016-03-26 ENCOUNTER — Ambulatory Visit (INDEPENDENT_AMBULATORY_CARE_PROVIDER_SITE_OTHER): Payer: Medicare Other | Admitting: Cardiovascular Disease

## 2016-03-26 ENCOUNTER — Encounter: Payer: Self-pay | Admitting: Cardiovascular Disease

## 2016-03-26 VITALS — BP 84/54 | HR 86 | Ht 70.0 in | Wt 154.0 lb

## 2016-03-26 DIAGNOSIS — I2510 Atherosclerotic heart disease of native coronary artery without angina pectoris: Secondary | ICD-10-CM | POA: Diagnosis not present

## 2016-03-26 DIAGNOSIS — I951 Orthostatic hypotension: Secondary | ICD-10-CM | POA: Diagnosis not present

## 2016-03-26 DIAGNOSIS — I5020 Unspecified systolic (congestive) heart failure: Secondary | ICD-10-CM | POA: Diagnosis not present

## 2016-03-26 MED ORDER — MIDODRINE HCL 2.5 MG PO TABS: 2.5000 mg | ORAL_TABLET | Freq: Three times a day (TID) | ORAL | Status: DC | PRN

## 2016-03-26 NOTE — Patient Instructions (Signed)
Dr Sallyanne Kuster has recommended making the following medication changes: 1. START Midodrine 2.5 mg - take 1 tablet when you wake up DO NOT LIE DOWN FOR 4 HOURS AFTER TAKING  Dr Sallyanne Kuster recommends that you schedule a follow-up appointment in 1 month.  If you need a refill on your cardiac medications before your next appointment, please call your pharmacy.

## 2016-03-27 ENCOUNTER — Telehealth: Payer: Self-pay | Admitting: Cardiovascular Disease

## 2016-03-27 NOTE — Telephone Encounter (Signed)
Correct, while ACEi are generally recommended for patients with low LV EF, he cannot take them due to hypotension.

## 2016-03-27 NOTE — Telephone Encounter (Signed)
Returned call to patient's daughter [name] she stated father received letter from [facility] today requesting him to ask his Dr.to prescribe a ace inhibitor.Stated would help with his heart failure.After reviewing chart B/P too low at 03/26/16 office visit with Dr.Croitoru and ace inhibitors lower B/P.Advised I will let Dr.Croitoru know.

## 2016-03-27 NOTE — Telephone Encounter (Signed)
New message      Talk to the nurse about a letter from patient's ins company stating that pt may benefit from an ace inhabitor.

## 2016-04-24 ENCOUNTER — Telehealth: Payer: Self-pay | Admitting: Cardiovascular Disease

## 2016-04-28 ENCOUNTER — Encounter: Payer: Self-pay | Admitting: Cardiovascular Disease

## 2016-04-28 ENCOUNTER — Ambulatory Visit (INDEPENDENT_AMBULATORY_CARE_PROVIDER_SITE_OTHER): Payer: Medicare Other | Admitting: Cardiovascular Disease

## 2016-04-28 VITALS — BP 112/70 | HR 83 | Ht 69.0 in | Wt 152.4 lb

## 2016-04-28 DIAGNOSIS — I2510 Atherosclerotic heart disease of native coronary artery without angina pectoris: Secondary | ICD-10-CM | POA: Diagnosis not present

## 2016-04-28 DIAGNOSIS — I5020 Unspecified systolic (congestive) heart failure: Secondary | ICD-10-CM

## 2016-04-28 DIAGNOSIS — I951 Orthostatic hypotension: Secondary | ICD-10-CM | POA: Diagnosis not present

## 2016-04-28 NOTE — Progress Notes (Signed)
Patient ID: Bruce Green, male   DOB: 1919-10-13, 82 y.o.   MRN: [id number] Patient ID: Bruce Green, male   DOB: 1919/03/31, 82 y.o.   MRN: [id number] Patient ID: Bruce Green, male   DOB: 1918-12-10, 82 y.o.   MRN: [id number]    Cardiology Office Note    Date:  04/28/2016   ID:  Bruce Green, DOB Jun 29, 1919, MRN [id number]  PCP:  Tawanna Solo, MD  Cardiologist:   Sanda Klein, MD   Chief Complaint  Patient presents with  . Follow-up    1 month  pt c/o stomach pains, loss of balance--dizzy when he stands up, no other Sx.    History of Present Illness:  [name] is a 82 y.o. male with coronary artery disease roughly 2 years after multivessel bypass surgery which led to marked improvement in left ventricular systolic function (EF 123456 percent increased to about 45%), but with difficulty in returning to previous functional status. He is primarily limited by severe dizziness in a pattern strongly suggestive of orthostatic hypotension.  He feels greatly improved after starting treatment with Midodrine. He no longer has a staggering gait and even his shortness of breath has improved suggesting that this may have been his way of expressing symptoms of hypotension. The benefits of the medication to wear off by the afternoon.  He has not had angina pectoris and denies any true focal neurological deficits, other than the dizziness.  He has not had any bleeding problems, change in bowel pattern, abdominal pain, fever, chills, cough, hemoptysis, change in vision or auditory acuity, intolerance to heat or cold, sweats, flushing, insomnia, mood swings.    Past Medical History  Diagnosis Date  . Stricture esophagus     distal  . GERD (gastroesophageal reflux disease)   . Hiatal hernia     6cm  . Coronary artery disease 04/21/2014      severe triple vessel  . Ischemic cardiomyopathy 03/2014    EF 25-30%  . Anxiety   . Pneumonia   . Colon polyp   . Dyslipidemia   .  TIA (transient ischemic attack)   . Cataract   . Vitamin D deficiency   . CKD (chronic kidney disease)     Past Surgical History  Procedure Laterality Date  . Cholecystectomy    . Shoulder surgery    . Appendectomy    . Esophagogastroduodenoscopy  04/09/2012    Procedure: ESOPHAGOGASTRODUODENOSCOPY (EGD);  Surgeon: Gatha Mayer, MD;  Location: [facility];  Service: Endoscopy;  Laterality: N/A;  . Coronary artery bypass graft N/A 04/24/2014    Procedure: CORONARY ARTERY BYPASS GRAFTING (CABG);  Surgeon: Gaye Pollack, MD;  Location: [place];  Service: Open Heart Surgery;  Laterality: N/A;  Times 3 using left internal mammary artery and endoscopically harvested right saphenous vein  . Intraoperative transesophageal echocardiogram N/A 04/24/2014    Procedure: INTRAOPERATIVE TRANSESOPHAGEAL ECHOCARDIOGRAM;  Surgeon: Gaye Pollack, MD;  Location: [facility] OR;  Service: Open Heart Surgery;  Laterality: N/A;  . Left and right heart catheterization with coronary angiogram N/A 04/21/2014    Procedure: LEFT AND RIGHT HEART CATHETERIZATION WITH CORONARY ANGIOGRAM;  Surgeon: Burnell Blanks, MD;  Location: [facility] CATH LAB;  Service: Cardiovascular;  Laterality: N/A;  . Cardiac surgery      Outpatient Prescriptions Prior to Visit  Medication Sig Dispense Refill  . aspirin 81 MG tablet Take 81 mg by mouth daily.    Marland Kitchen atorvastatin (LIPITOR) 80 MG tablet Take  40 mg by mouth daily.    Marland Kitchen b complex vitamins tablet Take 1 tablet by mouth daily.    . Cholecalciferol (VITAMIN D3) 5000 units TABS Take 1 tablet by mouth every other day.    . docusate sodium (COLACE) 100 MG capsule Take 100 mg by mouth daily.    . folic acid (FOLVITE) 1 MG tablet Take 1 mg by mouth 2 (two) times daily.    Marland Kitchen loratadine (CLARITIN) 10 MG tablet Take 1 tablet by mouth daily.    . midodrine (PROAMATINE) 2.5 MG tablet Take 1 tablet (2.5 mg total) by mouth 3 (three) times daily as needed. 90 tablet 5  . Multiple Vitamins-Minerals  (MULTIVITAMIN WITH MINERALS) tablet Take 1 tablet by mouth daily.    . naproxen sodium (ANAPROX) 220 MG tablet Take 440 mg by mouth daily as needed (pain).    . pantoprazole (PROTONIX) 40 MG tablet TAKE 1 TABLET (40 MG TOTAL) BY MOUTH DAILY BEFORE BREAKFAST. 90 tablet 2  . Probiotic Product (PROBIOTIC PO) Take 1 tablet by mouth daily.    . calcium & magnesium carbonates (MYLANTA) [id number] MG tablet Take 1 tablet by mouth daily.     No facility-administered medications prior to visit.     Allergies:   Sulfa antibiotics   Social History   Social History  . Marital Status: Widowed    Spouse Name: N/A  . Number of Children: 2  . Years of Education: N/A   Occupational History  . retired    Social History Main Topics  . Smoking status: Never Smoker   . Smokeless tobacco: Never Used  . Alcohol Use: 0.6 oz/week    1 Standard drinks or equivalent per week     Comment: rare  . Drug Use: No  . Sexual Activity: Not Asked   Other Topics Concern  . None   Social History Narrative   Retired Conservation officer, historic buildings. Widowed.           Family History:  The patient's family history includes Aneurysm in his brother; Arthritis in his sister; Breast cancer in his sister; Colon cancer in his brother; Dementia in his brother; Heart attack in his mother; Kidney disease in his father; Osteoporosis in his sister.   ROS:   Please see the history of present illness.    ROS All other systems reviewed and are negative.   PHYSICAL EXAM:   VS:  BP 112/70 mmHg  Pulse 83  Ht 5\' 9"  (1.753 m)  Wt 69.128 kg (152 lb 6.4 oz)  BMI 22.50 kg/m2    GEN: Well nourished, well developed, in no acute distress HEENT: normal Neck: no JVD, carotid bruits, or masses Cardiac: Occasional ectopy on a background of RRR; no murmurs, rubs, or gallops,no edema  Respiratory:  clear to auscultation bilaterally, normal work of breathing GI: soft, nontender, nondistended, + BS MS: no deformity or atrophy Skin: warm and dry, no  rash Neuro:  Alert and Oriented x 3, Strength and sensation are intact Psych: euthymic mood, full affect  Wt Readings from Last 3 Encounters:  04/28/16 69.128 kg (152 lb 6.4 oz)  03/26/16 69.854 kg (154 lb)  01/25/16 70.852 kg (156 lb 3.2 oz)      Studies/Labs Reviewed:   EKG:  EKG is ordered today.  The ekg ordered today demonstrates Normal sinus rhythm with premature ventricular complexes in a pattern of trigeminy  Recent Labs: No results found for requested labs within last 365 days.   Lipid Panel  Component Value Date/Time   CHOL 129 08/14/2014 1013   TRIG 149 08/14/2014 1013   HDL 36* 08/14/2014 1013   CHOLHDL 3.6 08/14/2014 1013   VLDL 30 08/14/2014 1013   LDLCALC 63 08/14/2014 1013     ASSESSMENT:    1. Atherosclerosis of native coronary artery of native heart without angina pectoris   2. Systolic CHF with reduced left ventricular function, NYHA class 2 ([place])   3. Orthostatic hypotension      PLAN:  In order of problems listed above:  1. CAD s/p CABG: Asymptomatic 2. CHF: Mildly depressed left ventricular systolic function, most recent assessment EF 45-50% in September 2015. Intolerant of ace inhibitors/angiotensin receptor blockers/beta blockers due to hypotension. He is not receiving diuretics and I think these would definitely worsen his dizziness. Functional class I-II. Although he does experience some exertional dyspnea, clearly the orthostatic dizziness is the more serious complaint. 3. Orthostatic hypotension: On treatment for the vasoconstrictor and does not have excessive hypertension. He is reminded not to lie down for 4 hours after taking this medication. There is an option to titrate the dose up and may need to increase the frequency to twice daily, but his daughter expresses reluctance. She has seen substantial improvement and is not sure that he will remember to comply with recommendations of the second part of the day.  Medication Adjustments/Labs  and Tests Ordered: Current medicines are reviewed at length with the patient today.  Concerns regarding medicines are outlined above.  Medication changes, Labs and Tests ordered today are listed in the Patient Instructions below. Patient Instructions  Your physician recommends that you continue on your current medications as directed. Please refer to the Current Medication list given to you today.  Dr Sallyanne Kuster recommends that you schedule a follow-up appointment in 6 months. You will receive a reminder letter in the mail two months in advance. If you don't receive a letter, please call our office to schedule the follow-up appointment.  If you need a refill on your cardiac medications before your next appointment, please call your pharmacy.       Signed, Sanda Klein, MD  04/28/2016 1:05 PM    [facility] [address], [place], [place]  [address] Phone: [phone]; Fax: [phone]

## 2016-04-28 NOTE — Patient Instructions (Signed)
Your physician recommends that you continue on your current medications as directed. Please refer to the Current Medication list given to you today.  Dr Croitoru recommends that you schedule a follow-up appointment in 6 months. You will receive a reminder letter in the mail two months in advance. If you don't receive a letter, please call our office to schedule the follow-up appointment.  If you need a refill on your cardiac medications before your next appointment, please call your pharmacy. 

## 2016-05-12 ENCOUNTER — Other Ambulatory Visit: Payer: Self-pay | Admitting: Internal Medicine

## 2016-08-21 ENCOUNTER — Telehealth: Payer: Self-pay | Admitting: Cardiovascular Disease

## 2016-08-21 NOTE — Telephone Encounter (Signed)
New Message  Pt c/o of Chest Pain: STAT if CP now or developed within 24 hours  1. Are you having CP right now? No  2. Are you experiencing any other symptoms (ex. SOB, nausea, vomiting, sweating)? Lightheaded and dizziness  3. How long have you been experiencing CP? A Month  4. Is your CP continuous or coming and going? Coming and going  5. Have you taken Nitroglycerin? No  Pts daughter voiced pain is really sharp usually last for about 15 minutes with one incident.  Pts daughter voiced to contact or view chart from MD- [facility] office of results for EKG.  Please f/u with pt

## 2016-08-21 NOTE — Telephone Encounter (Signed)
Spoke with pt, he woke up 4 nights ago with a Dill pain in his left rib area. He said it felt like he was sucker punched in the ribs. He reports it lasted for about 30 to 40 min and then he went to sleep. Since then he has had no further problems, no pain of any kind and no SOB. He walked to the mailbox today with no problems. He was seen by his medical doctor and they told him to call and get an appointment. The medical doctor is faxing the EKG for our review. Will try to locate EKG. Unable to locate ECG, spoke with dr Ammie Ferrier office. They are going to fax the ECG to [phone]. Will show to dr croitoru tomorrow

## 2016-08-22 NOTE — Telephone Encounter (Signed)
lm for [facility]) to call back.

## 2016-08-29 NOTE — Telephone Encounter (Signed)
Spoke with Dr Martinique on 08/22/2016. Dr Martinique reviewed information given and compared Bruce Green's EKG with PCP with his last EKG we have on file. Per Dr Martinique, there were no changes in his EKG. Just occasional premature beats which is not new.  Called daughter again yesterday, 08/28/16 and reviewed recommendations with her. Daughter verbalized understanding. Reports that patient is fine and reports no new complaints. He has follow-up scheduled on 09/17/2016. It was decided to keep this appointment since Bruce Grossheim has been feeling fine. Notified daughter to call if any new symptoms present and we could work Bruce Panik in sooner. Daughter agreed with plan.

## 2016-09-17 ENCOUNTER — Ambulatory Visit (INDEPENDENT_AMBULATORY_CARE_PROVIDER_SITE_OTHER): Payer: Medicare Other | Admitting: Cardiovascular Disease

## 2016-09-17 ENCOUNTER — Encounter: Payer: Self-pay | Admitting: Cardiovascular Disease

## 2016-09-17 VITALS — BP 110/70 | HR 96 | Ht 69.0 in | Wt 156.0 lb

## 2016-09-17 DIAGNOSIS — E785 Hyperlipidemia, unspecified: Secondary | ICD-10-CM | POA: Diagnosis not present

## 2016-09-17 DIAGNOSIS — I5020 Unspecified systolic (congestive) heart failure: Secondary | ICD-10-CM

## 2016-09-17 DIAGNOSIS — I2510 Atherosclerotic heart disease of native coronary artery without angina pectoris: Secondary | ICD-10-CM | POA: Diagnosis not present

## 2016-09-17 DIAGNOSIS — Z79899 Other long term (current) drug therapy: Secondary | ICD-10-CM | POA: Diagnosis not present

## 2016-09-17 DIAGNOSIS — I951 Orthostatic hypotension: Secondary | ICD-10-CM

## 2016-09-17 NOTE — Patient Instructions (Signed)
Medication Instructions: Dr Croitoru recommends that you continue on your current medications as directed. Please refer to the Current Medication list given to you today.  Labwork: Your physician recommends that you return for lab work at your earliest convenience - FASTING.  Testing/Procedures: NONE ORDERED  Follow-up: Dr Croitoru recommends that you schedule a follow-up appointment in 6 months. You will receive a reminder letter in the mail two months in advance. If you don't receive a letter, please call our office to schedule the follow-up appointment.  If you need a refill on your cardiac medications before your next appointment, please call your pharmacy. 
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
## 2016-10-09 LAB — COMPREHENSIVE METABOLIC PANEL
ALBUMIN: 3.9 g/dL (ref 3.6–5.1)
ALT: 9 U/L (ref 9–46)
AST: 16 U/L (ref 10–35)
Alkaline Phosphatase: 83 U/L (ref 40–115)
BUN: 22 mg/dL (ref 7–25)
CHLORIDE: 107 mmol/L (ref 98–110)
CO2: 26 mmol/L (ref 20–31)
Calcium: 9.5 mg/dL (ref 8.6–10.3)
Creat: 1.28 mg/dL — ABNORMAL HIGH (ref 0.70–1.11)
Glucose, Bld: 110 mg/dL — ABNORMAL HIGH (ref 65–99)
POTASSIUM: 4.7 mmol/L (ref 3.5–5.3)
Sodium: 142 mmol/L (ref 135–146)
TOTAL PROTEIN: 6.1 g/dL (ref 6.1–8.1)
Total Bilirubin: 0.8 mg/dL (ref 0.2–1.2)

## 2016-10-09 LAB — LIPID PANEL
Cholesterol: 147 mg/dL (ref <200)
HDL: 39 mg/dL — AB (ref >40)
LDL CALC: 61 mg/dL (ref <100)
TRIGLYCERIDES: 234 mg/dL — AB (ref <150)
Total CHOL/HDL Ratio: 3.8 Ratio (ref <5.0)
VLDL: 47 mg/dL — AB (ref <30)

## 2017-01-15 ENCOUNTER — Other Ambulatory Visit: Payer: Self-pay | Admitting: Cardiovascular Disease

## 2017-01-15 NOTE — Telephone Encounter (Signed)
Rx(s) sent to pharmacy electronically.  

## 2017-02-12 ENCOUNTER — Other Ambulatory Visit: Payer: Self-pay | Admitting: Internal Medicine

## 2017-05-11 ENCOUNTER — Other Ambulatory Visit: Payer: Self-pay | Admitting: Internal Medicine

## 2017-05-13 ENCOUNTER — Encounter (HOSPITAL_COMMUNITY): Payer: Self-pay | Admitting: Family Medicine

## 2017-05-13 ENCOUNTER — Inpatient Hospital Stay (HOSPITAL_COMMUNITY): Payer: Medicare Other

## 2017-05-13 ENCOUNTER — Inpatient Hospital Stay (HOSPITAL_COMMUNITY)
Admission: AD | Admit: 2017-05-13 | Discharge: 2017-05-21 | DRG: 444 | Disposition: A | Discharge status: Home / Self Care | Payer: Medicare Other | Source: Ambulatory Visit | Attending: Internal Medicine | Admitting: Internal Medicine

## 2017-05-13 DIAGNOSIS — M25512 Pain in left shoulder: Secondary | ICD-10-CM | POA: Diagnosis present

## 2017-05-13 DIAGNOSIS — I5043 Acute on chronic combined systolic (congestive) and diastolic (congestive) heart failure: Secondary | ICD-10-CM | POA: Diagnosis present

## 2017-05-13 DIAGNOSIS — K222 Esophageal obstruction: Secondary | ICD-10-CM | POA: Diagnosis not present

## 2017-05-13 DIAGNOSIS — I481 Persistent atrial fibrillation: Secondary | ICD-10-CM | POA: Diagnosis not present

## 2017-05-13 DIAGNOSIS — I2510 Atherosclerotic heart disease of native coronary artery without angina pectoris: Secondary | ICD-10-CM | POA: Diagnosis not present

## 2017-05-13 DIAGNOSIS — I951 Orthostatic hypotension: Secondary | ICD-10-CM | POA: Diagnosis present

## 2017-05-13 DIAGNOSIS — Z79899 Other long term (current) drug therapy: Secondary | ICD-10-CM | POA: Diagnosis not present

## 2017-05-13 DIAGNOSIS — J189 Pneumonia, unspecified organism: Secondary | ICD-10-CM | POA: Diagnosis present

## 2017-05-13 DIAGNOSIS — N189 Chronic kidney disease, unspecified: Secondary | ICD-10-CM | POA: Diagnosis present

## 2017-05-13 DIAGNOSIS — I714 Abdominal aortic aneurysm, without rupture, unspecified: Secondary | ICD-10-CM | POA: Diagnosis present

## 2017-05-13 DIAGNOSIS — K219 Gastro-esophageal reflux disease without esophagitis: Secondary | ICD-10-CM | POA: Diagnosis present

## 2017-05-13 DIAGNOSIS — R310 Gross hematuria: Secondary | ICD-10-CM | POA: Diagnosis present

## 2017-05-13 DIAGNOSIS — I340 Nonrheumatic mitral (valve) insufficiency: Secondary | ICD-10-CM | POA: Diagnosis not present

## 2017-05-13 DIAGNOSIS — R339 Retention of urine, unspecified: Secondary | ICD-10-CM | POA: Diagnosis present

## 2017-05-13 DIAGNOSIS — I255 Ischemic cardiomyopathy: Secondary | ICD-10-CM | POA: Diagnosis present

## 2017-05-13 DIAGNOSIS — R7989 Other specified abnormal findings of blood chemistry: Secondary | ICD-10-CM | POA: Diagnosis not present

## 2017-05-13 DIAGNOSIS — I480 Paroxysmal atrial fibrillation: Secondary | ICD-10-CM | POA: Diagnosis present

## 2017-05-13 DIAGNOSIS — R0603 Acute respiratory distress: Secondary | ICD-10-CM | POA: Diagnosis present

## 2017-05-13 DIAGNOSIS — E876 Hypokalemia: Secondary | ICD-10-CM | POA: Diagnosis present

## 2017-05-13 DIAGNOSIS — Z7982 Long term (current) use of aspirin: Secondary | ICD-10-CM | POA: Diagnosis not present

## 2017-05-13 DIAGNOSIS — K8051 Calculus of bile duct without cholangitis or cholecystitis with obstruction: Secondary | ICD-10-CM | POA: Diagnosis present

## 2017-05-13 DIAGNOSIS — R0600 Dyspnea, unspecified: Secondary | ICD-10-CM

## 2017-05-13 DIAGNOSIS — R945 Abnormal results of liver function studies: Secondary | ICD-10-CM | POA: Diagnosis present

## 2017-05-13 DIAGNOSIS — Z8673 Personal history of transient ischemic attack (TIA), and cerebral infarction without residual deficits: Secondary | ICD-10-CM

## 2017-05-13 DIAGNOSIS — I5021 Acute systolic (congestive) heart failure: Secondary | ICD-10-CM | POA: Diagnosis not present

## 2017-05-13 DIAGNOSIS — I509 Heart failure, unspecified: Secondary | ICD-10-CM

## 2017-05-13 DIAGNOSIS — I5041 Acute combined systolic (congestive) and diastolic (congestive) heart failure: Secondary | ICD-10-CM | POA: Diagnosis not present

## 2017-05-13 DIAGNOSIS — Z882 Allergy status to sulfonamides status: Secondary | ICD-10-CM

## 2017-05-13 DIAGNOSIS — I130 Hypertensive heart and chronic kidney disease with heart failure and stage 1 through stage 4 chronic kidney disease, or unspecified chronic kidney disease: Secondary | ICD-10-CM | POA: Diagnosis present

## 2017-05-13 DIAGNOSIS — I360 Nonrheumatic tricuspid (valve) stenosis: Secondary | ICD-10-CM | POA: Diagnosis not present

## 2017-05-13 DIAGNOSIS — R740 Nonspecific elevation of levels of transaminase and lactic acid dehydrogenase [LDH]: Secondary | ICD-10-CM | POA: Diagnosis present

## 2017-05-13 DIAGNOSIS — K8050 Calculus of bile duct without cholangitis or cholecystitis without obstruction: Secondary | ICD-10-CM | POA: Diagnosis not present

## 2017-05-13 DIAGNOSIS — R17 Unspecified jaundice: Secondary | ICD-10-CM

## 2017-05-13 LAB — PROTIME-INR
INR: 1.5
Prothrombin Time: 18.3 seconds — ABNORMAL HIGH (ref 11.4–15.2)

## 2017-05-13 MED ORDER — CALCIUM-MAGNESIUM 500-250 MG PO TABS: 1.0000 | ORAL_TABLET | Freq: Every day | ORAL | Status: DC

## 2017-05-13 MED ORDER — ONDANSETRON HCL 4 MG/2ML IJ SOLN: 4.0000 mg | Freq: Four times a day (QID) | INTRAMUSCULAR | Status: DC | PRN

## 2017-05-13 MED ORDER — LORAZEPAM 0.5 MG PO TABS
0.5000 mg | ORAL_TABLET | Freq: Four times a day (QID) | ORAL | Status: DC | PRN
  Administered 2017-05-17 (×3): 0.5 mg via ORAL
  Filled 2017-05-13 (×3): qty 1

## 2017-05-13 MED ORDER — LORATADINE 10 MG PO TABS
10.0000 mg | ORAL_TABLET | Freq: Every day | ORAL | Status: DC
  Administered 2017-05-14 – 2017-05-21 (×8): 10 mg via ORAL
  Filled 2017-05-13 (×8): qty 1

## 2017-05-13 MED ORDER — B COMPLEX-C PO TABS
1.0000 | ORAL_TABLET | Freq: Every day | ORAL | Status: DC
  Administered 2017-05-14 – 2017-05-21 (×6): 1 via ORAL
  Filled 2017-05-13 (×8): qty 1

## 2017-05-13 MED ORDER — ACETAMINOPHEN 650 MG RE SUPP: 650.0000 mg | Freq: Four times a day (QID) | RECTAL | Status: DC | PRN

## 2017-05-13 MED ORDER — BISACODYL 5 MG PO TBEC: 5.0000 mg | DELAYED_RELEASE_TABLET | Freq: Every day | ORAL | Status: DC | PRN

## 2017-05-13 MED ORDER — AZITHROMYCIN 500 MG PO TABS
250.0000 mg | ORAL_TABLET | Freq: Every day | ORAL | Status: AC
  Administered 2017-05-14 – 2017-05-15 (×2): 250 mg via ORAL
  Filled 2017-05-13 (×2): qty 1

## 2017-05-13 MED ORDER — PANTOPRAZOLE SODIUM 40 MG PO TBEC
40.0000 mg | DELAYED_RELEASE_TABLET | Freq: Every day | ORAL | Status: DC
  Administered 2017-05-14 – 2017-05-21 (×8): 40 mg via ORAL
  Filled 2017-05-13 (×8): qty 1

## 2017-05-13 MED ORDER — DOCUSATE SODIUM 100 MG PO CAPS
100.0000 mg | ORAL_CAPSULE | Freq: Every day | ORAL | Status: DC
  Administered 2017-05-14 – 2017-05-21 (×7): 100 mg via ORAL
  Filled 2017-05-13 (×7): qty 1

## 2017-05-13 MED ORDER — FENTANYL CITRATE (PF) 100 MCG/2ML IJ SOLN: 25.0000 ug | INTRAMUSCULAR | Status: DC | PRN

## 2017-05-13 MED ORDER — INSULIN ASPART 100 UNIT/ML ~~LOC~~ SOLN
0.0000 [IU] | Freq: Three times a day (TID) | SUBCUTANEOUS | Status: DC
  Administered 2017-05-14: 1 [IU] via SUBCUTANEOUS
  Administered 2017-05-14: 2 [IU] via SUBCUTANEOUS
  Administered 2017-05-15 – 2017-05-16 (×3): 1 [IU] via SUBCUTANEOUS
  Administered 2017-05-17: 2 [IU] via SUBCUTANEOUS
  Administered 2017-05-18 – 2017-05-21 (×3): 1 [IU] via SUBCUTANEOUS

## 2017-05-13 MED ORDER — FOLIC ACID 1 MG PO TABS
1.0000 mg | ORAL_TABLET | Freq: Every day | ORAL | Status: DC
  Administered 2017-05-14 – 2017-05-21 (×7): 1 mg via ORAL
  Filled 2017-05-13 (×7): qty 1

## 2017-05-13 MED ORDER — MIDODRINE HCL 5 MG PO TABS
2.5000 mg | ORAL_TABLET | Freq: Every day | ORAL | Status: DC
  Administered 2017-05-14: 2.5 mg via ORAL
  Filled 2017-05-13: qty 1

## 2017-05-13 MED ORDER — CALCIUM CARBONATE 1250 (500 CA) MG PO TABS
1.0000 | ORAL_TABLET | Freq: Every day | ORAL | Status: DC
  Administered 2017-05-14 – 2017-05-21 (×8): 500 mg via ORAL
  Filled 2017-05-13 (×9): qty 1

## 2017-05-13 MED ORDER — ACETAMINOPHEN 325 MG PO TABS: 650.0000 mg | ORAL_TABLET | Freq: Four times a day (QID) | ORAL | Status: DC | PRN

## 2017-05-13 MED ORDER — POLYETHYLENE GLYCOL 3350 17 G PO PACK: 17.0000 g | PACK | Freq: Every day | ORAL | Status: DC | PRN

## 2017-05-13 MED ORDER — HYDROCODONE-ACETAMINOPHEN 5-325 MG PO TABS
1.0000 | ORAL_TABLET | ORAL | Status: DC | PRN
Start: 2017-05-13 — End: 2017-05-21
  Administered 2017-05-14: 1 via ORAL
  Filled 2017-05-13: qty 1

## 2017-05-13 MED ORDER — ONDANSETRON HCL 4 MG PO TABS: 4.0000 mg | ORAL_TABLET | Freq: Four times a day (QID) | ORAL | Status: DC | PRN

## 2017-05-13 MED ORDER — GLUCOSAMINE 1500 COMPLEX PO CAPS: 1.0000 | ORAL_CAPSULE | Freq: Every day | ORAL | Status: DC

## 2017-05-13 MED ORDER — MAGNESIUM OXIDE 400 (241.3 MG) MG PO TABS
200.0000 mg | ORAL_TABLET | Freq: Every day | ORAL | Status: DC
  Administered 2017-05-14 – 2017-05-21 (×8): 200 mg via ORAL
  Filled 2017-05-13 (×8): qty 1

## 2017-05-13 MED ORDER — SODIUM CHLORIDE 0.9 % IV SOLN
INTRAVENOUS | Status: AC
  Administered 2017-05-13: 23:00:00 via INTRAVENOUS

## 2017-05-13 NOTE — Progress Notes (Signed)
PHARMACIST - PHYSICIAN ORDER COMMUNICATION  CONCERNING: P&T Medication Policy on Herbal Medications  DESCRIPTION:  This patient's order for:  Glucosamine  has been noted.  This product(s) is classified as an "herbal" or natural product. Due to a lack of definitive safety studies or FDA approval, nonstandard manufacturing practices, plus the potential risk of unknown drug-drug interactions while on inpatient medications, the Pharmacy and Therapeutics Committee does not permit the use of "herbal" or natural products of this type within [facility].   ACTION TAKEN: The pharmacy department is unable to verify this order at this time and your patient has been informed of this safety policy. Please reevaluate patient's clinical condition at discharge and address if the herbal or natural product(s) should be resumed at that time.   Alycia Rossetti, PharmD, BCPS  8:15 PM

## 2017-05-13 NOTE — Progress Notes (Signed)
Bruce Green [id number] Admitted to 4M25: 05/13/2017 8:38 PM Attending Provider: Vianne Bulls, MD    Bruce Green is a 83 y.o. male patient admitted from ED awake, alert  & orientated  X 3, HOH and may be confused at times per daughter.  Full Code, VSS - Blood pressure 101/81, pulse (!) 47, temperature 97.6 F (36.4 C), height 5\' 10"  (1.778 m), weight 68.3 kg (150 lb 9.2 oz), SpO2 98 %., R/A, no c/o shortness of breath, no c/o chest pain, no distress noted. Non Tele. No IV access.  Allergies:   Allergies  Allergen Reactions  . Sulfa Antibiotics Rash     Past Medical History:  Diagnosis Date  . Anxiety   . Cataract   . CKD (chronic kidney disease)   . Colon polyp   . Coronary artery disease 04/21/2014     severe triple vessel  . Dyslipidemia   . GERD (gastroesophageal reflux disease)   . Hiatal hernia    6cm  . Ischemic cardiomyopathy 03/2014   EF 25-30%  . Pneumonia   . Stricture esophagus    distal  . TIA (transient ischemic attack)   . Vitamin D deficiency     History:  Will be obtained from the patient and daughter.  Pt orientation to unit, room and routine. Information packet given to patient/family and safety video watched.  Admission INP armband ID verified with patient/family, and in place. SR up x 2, fall risk assessment complete with Patient and family verbalizing understanding of risks associated with falls. Pt verbalizes an understanding of how to use the call bell and to call for help before getting out of bed.  Skin, clean-dry- intact with an abrasion to left lower leg.   No evidence of skin break down noted on exam.    Will cont to monitor and assist as needed.  Lindalou Hose, RN 05/13/2017 8:38 PM

## 2017-05-13 NOTE — H&P (Signed)
History and Physical    Bruce Green UJW:[id number] DOB: 1919/06/14 DOA: 05/13/2017  PCP: Kathyrn Lass, MD   Patient coming from: Home, direct admission from PCP  Chief Complaint: Gross hematuria   HPI: Bruce Green is a 83 y.o. male with medical history significant for coronary artery disease, GERD, and orthostatic hypotension, who presented to his PCP today for evaluation of gross hematuria. Patient reports being in his usual state of health until several days ago and developed a cough productive of green sputum, mild exertional dyspnea, and urinary retention. He was evaluated by his PCP for this yesterday, given a shot of Rocephin and sent home with azithromycin. He reports that his cough and dyspnea improved markedly over the next 24 hours, but he developed gross hematuria. Patient return to the clinic today for evaluation of gross hematuria and basic blood work was obtained. Labs returned with a leukocytosis to 24,200 and gross derangements in his CMP, including total bilirubin 7.7, AST 2012, ALT 192, and alkaline phosphatase 416. Patient was afebrile and with his vital signs stable in the clinic and a direct admission was arranged to medical/surgical unit at [facility] for further evaluation and management of gross hematuria and painless jaundice.  Review of Systems:  All other systems reviewed and apart from HPI, are negative.  Past Medical History:  Diagnosis Date  . Anxiety   . Cataract   . CKD (chronic kidney disease)   . Colon polyp   . Coronary artery disease 04/21/2014     severe triple vessel  . Dyslipidemia   . GERD (gastroesophageal reflux disease)   . Hiatal hernia    6cm  . Ischemic cardiomyopathy 03/2014   EF 25-30%  . Pneumonia   . Stricture esophagus    distal  . TIA (transient ischemic attack)   . Vitamin D deficiency     Past Surgical History:  Procedure Laterality Date  . APPENDECTOMY    . CARDIAC SURGERY    . CHOLECYSTECTOMY    .  CORONARY ARTERY BYPASS GRAFT N/A 04/24/2014   Procedure: CORONARY ARTERY BYPASS GRAFTING (CABG);  Surgeon: Gaye Pollack, MD;  Location: [place];  Service: Open Heart Surgery;  Laterality: N/A;  Times 3 using left internal mammary artery and endoscopically harvested right saphenous vein  . ESOPHAGOGASTRODUODENOSCOPY  04/09/2012   Procedure: ESOPHAGOGASTRODUODENOSCOPY (EGD);  Surgeon: Gatha Mayer, MD;  Location: [facility];  Service: Endoscopy;  Laterality: N/A;  . INTRAOPERATIVE TRANSESOPHAGEAL ECHOCARDIOGRAM N/A 04/24/2014   Procedure: INTRAOPERATIVE TRANSESOPHAGEAL ECHOCARDIOGRAM;  Surgeon: Gaye Pollack, MD;  Location: [place] [facility];  Service: Open Heart Surgery;  Laterality: N/A;  . LEFT AND RIGHT HEART CATHETERIZATION WITH CORONARY ANGIOGRAM N/A 04/21/2014   Procedure: LEFT AND RIGHT HEART CATHETERIZATION WITH CORONARY ANGIOGRAM;  Surgeon: Burnell Blanks, MD;  Location[facility] C[facility] LAB;  Service: Cardiovascular;  Laterality: N/A;  . SHOULDER SURGERY       reports that he has never smoked. He has never used smokeless tobacco. He reports that he drinks about 0.6 oz of alcohol per week . He reports that he does not use drugs.  Allergies  Allergen Reactions  . Sulfa Antibiotics Rash    Family History  Problem Relation Age of Onset  . Heart attack Mother   . Kidney disease Father   . Colon cancer Brother   . Dementia Brother   . Aneurysm Brother   . Breast cancer Sister        x 2  . Arthritis  Sister   . Osteoporosis Sister      Prior to Admission medications   Medication Sig Start Date End Date Taking? Authorizing Provider  aspirin 81 MG tablet Take 81 mg by mouth daily.   Yes [provider]  atorvastatin (LIPITOR) 80 MG tablet Take 40 mg by mouth daily.   Yes [provider]  azithromycin (ZITHROMAX) 250 MG tablet Take 250-500 mg by mouth daily. Take 2 tablets on day 1 then take 1 tablet a day thereafter.   Yes [provider]  B Complex-C (B-COMPLEX WITH  VITAMIN C) tablet Take 1 tablet by mouth daily.   Yes [provider]  Cholecalciferol (VITAMIN D3) 5000 units TABS Take 1 tablet by mouth every other day.   Yes [provider]  docusate sodium (COLACE) 100 MG capsule Take 100 mg by mouth daily.   Yes [provider]  folic acid (FOLVITE) 1 MG tablet Take 400 mcg by mouth daily.  04/28/14  Yes Lars Pinks M, PA-C  Glucosamine-Chondroit-Vit C-Mn (GLUCOSAMINE 1500 COMPLEX PO) Take 1,500 mg by mouth.   Yes [provider]  GUAIFENESIN 1200 PO Take 1,200 mg by mouth.   Yes [provider]  isosorbide mononitrate (ISMO,MONOKET) 10 MG tablet Take 10 mg by mouth daily as needed (Anxiety).    Yes [provider]  loratadine (CLARITIN) 10 MG tablet Take 1 tablet by mouth daily.   Yes [provider]  Magnesium 400 MG CAPS Take 400 mg by mouth daily.   Yes [provider]  midodrine (PROAMATINE) 2.5 MG tablet Take 1 tablet (2.5 mg total) by mouth daily. 01/15/17  Yes Croitoru, Mihai, MD  Multiple Vitamins-Minerals (MULTIVITAMIN WITH MINERALS) tablet Take 1 tablet by mouth daily.   Yes [provider]  pantoprazole (PROTONIX) 40 MG tablet TAKE 1 TABLET (40 MG TOTAL) BY MOUTH DAILY BEFORE BREAKFAST. 05/11/17  Yes Gatha Mayer, MD  Probiotic Product (PROBIOTIC PO) Take 1 tablet by mouth daily.   Yes [provider]  acetaminophen (TYLENOL) 325 MG tablet Take 650 mg by mouth every 6 (six) hours as needed for moderate pain.    [provider]  Calcium-Magnesium 500-250 MG TABS Take 1 tablet by mouth daily.    [provider]    Physical Exam: Vitals:   05/13/17 1822  BP: 101/81  Pulse: (!) 47  Temp: 97.6 F (36.4 C)  SpO2: 98%  Weight: 68.3 kg (150 lb 9.2 oz)  Height: 5\' 10"  (1.778 m)      Constitutional: NAD, calm, comfortable, jaundice Eyes: PERTLA, lids and conjunctivae normal ENMT: Mucous membranes are moist. Posterior pharynx  clear of any exudate or lesions.   Neck: normal, supple, no masses, no thyromegaly Respiratory: clear to auscultation bilaterally, no wheezing, no crackles. Normal respiratory effort.   Cardiovascular: S1 & S2 heard, regular rate and rhythm. No extremity edema. No significant JVD. Abdomen: No distension, no tenderness, no masses palpated. Bowel sounds active.  Musculoskeletal: no clubbing / cyanosis. No joint deformity upper and lower extremities. Normal muscle tone.  Skin: no significant rashes, lesions, ulcers. Warm, dry, well-perfused. Jaundice. Neurologic: CN 2-12 grossly intact. Sensation intact, DTR normal. Strength 5/5 in all 4 limbs.  Psychiatric: Alert and oriented x 3. Pleasant and cooperative.     Labs on Admission: I have personally reviewed following labs and imaging studies  CBC: No results for input(s): WBC, NEUTROABS, HGB, HCT, MCV, PLT in the last 168 hours. Basic Metabolic Panel: No results for input(s):  NA, K, CL, CO2, GLUCOSE, BUN, CREATININE, CALCIUM, MG, PHOS in the last 168 hours. GFR: CrCl cannot be calculated (Patient's most recent lab result is older than the maximum 21 days allowed.). Liver Function Tests: No results for input(s): AST, ALT, ALKPHOS, BILITOT, PROT, ALBUMIN in the last 168 hours. No results for input(s): LIPASE, AMYLASE in the last 168 hours. No results for input(s): AMMONIA in the last 168 hours. Coagulation Profile: No results for input(s): INR, PROTIME in the last 168 hours. Cardiac Enzymes: No results for input(s): CKTOTAL, CKMB, CKMBINDEX, TROPONINI in the last 168 hours. BNP (last 3 results) No results for input(s): PROBNP in the last 8760 hours. HbA1C: No results for input(s): HGBA1C in the last 72 hours. CBG: No results for input(s): GLUCAP in the last 168 hours. Lipid Profile: No results for input(s): CHOL, HDL, LDLCALC, TRIG, CHOLHDL, LDLDIRECT in the last 72 hours. Thyroid Function Tests: No results for input(s): TSH, T4TOTAL,  FREET4, T3FREE, THYROIDAB in the last 72 hours. Anemia Panel: No results for input(s): VITAMINB12, FOLATE, FERRITIN, TIBC, IRON, RETICCTPCT in the last 72 hours. Urine analysis:    Component Value Date/Time   COLORURINE YELLOW 04/24/2014 0108   APPEARANCEUR CLEAR 04/24/2014 0108   LABSPEC 1.013 04/24/2014 0108   PHURINE 5.5 04/24/2014 0108   GLUCOSEU NEGATIVE 04/24/2014 0108   HGBUR NEGATIVE 04/24/2014 0108   BILIRUBINUR NEGATIVE 04/24/2014 0108   KETONESUR NEGATIVE 04/24/2014 0108   PROTEINUR NEGATIVE 04/24/2014 0108   UROBILINOGEN 1.0 04/24/2014 0108   NITRITE NEGATIVE 04/24/2014 0108   LEUKOCYTESUR MODERATE (A) 04/24/2014 0108   Sepsis Labs: @LABRCNTIP (procalcitonin:4,lacticidven:4) )No results found for this or any previous visit (from the past 240 hour(s)).   Radiological Exams on Admission: No results found.  EKG: Not performed.   Assessment/Plan  1. Gross hematuria  - Pt presented to PCP today with one day of gross hematuria  - Basic blood work with PCP revealed a leukocytosis of 24k, no anemia, stable renal fxn with SCr 1.2, and abnormal LFT's as below  - He denies dysuria, flank pain, or fevers - Neoplasia is the primary concern; hemorrhagic cystitis also possible  - Plan to start with UA and renal US, empiric Rocephin   2. Elevated LFT's, painless jaundice - Pt had normal LFT's in November '17  - He is s/p cholecystectomy  - With PCP on day of admission, total bili was 7.7, AST 212, and ALT 192 - There is no abdominal pain, no fevers - Plan to start with abd [place], check INR    3. CAP  - Pt had a productive cough and dyspnea several days ago, was diagnosed with an early PNA by PCP, given a shot of Rocephin and Rx for azithromycin  - No cough or dyspnea noted, lungs CTAB  - Continue azithromycin to complete course  4. Orthostatic hypotension  - Chronic and stable  - Continue midodrine    5. CAD - No anginal complaints  - Hold ASA 81 in light of hematuria,  hold statin in light of LFT's    6. Left shoulder pain - Pain localized to the joint and only with arm movements  - Following with PCP for this  - Continue prn analgesia   7. GERD - Stable, managed at home with daily PPI, will continue    8. AAA  - 3.7 cm AAA noted incidentally on abd [place] and could be followed-up with repeat US in 92yrs    DVT prophylaxis: SCD's  Code Status: Full  Family  Communication: Daughter updated at bedside Disposition Plan: Admit to med-surg Consults called: None Admission status: Inpatient    Vianne Bulls, MD [facility] Pager [phone]  If 7PM-7AM, please contact night-coverage [email or web address] Password [email or web address]  05/13/2017, 8:12 PM

## 2017-05-14 ENCOUNTER — Encounter (HOSPITAL_COMMUNITY): Payer: Self-pay | Admitting: Gastroenterology

## 2017-05-14 ENCOUNTER — Inpatient Hospital Stay (HOSPITAL_COMMUNITY): Payer: Medicare Other

## 2017-05-14 DIAGNOSIS — I714 Abdominal aortic aneurysm, without rupture, unspecified: Secondary | ICD-10-CM | POA: Diagnosis present

## 2017-05-14 LAB — TSH: TSH: 0.79 u[IU]/mL (ref 0.350–4.500)

## 2017-05-14 LAB — COMPREHENSIVE METABOLIC PANEL
ALT: 123 U/L — AB (ref 17–63)
AST: 86 U/L — ABNORMAL HIGH (ref 15–41)
Albumin: 2.8 g/dL — ABNORMAL LOW (ref 3.5–5.0)
Alkaline Phosphatase: 306 U/L — ABNORMAL HIGH (ref 38–126)
Anion gap: 12 (ref 5–15)
BUN: 32 mg/dL — ABNORMAL HIGH (ref 6–20)
CHLORIDE: 106 mmol/L (ref 101–111)
CO2: 21 mmol/L — ABNORMAL LOW (ref 22–32)
CREATININE: 1.17 mg/dL (ref 0.61–1.24)
Calcium: 8.6 mg/dL — ABNORMAL LOW (ref 8.9–10.3)
GFR calc Af Amer: 59 mL/min — ABNORMAL LOW (ref >60)
GFR calc non Af Amer: 51 mL/min — ABNORMAL LOW (ref >60)
Glucose, Bld: 120 mg/dL — ABNORMAL HIGH (ref 65–99)
Potassium: 4.4 mmol/L (ref 3.5–5.1)
Sodium: 139 mmol/L (ref 135–145)
Total Bilirubin: 6.1 mg/dL — ABNORMAL HIGH (ref 0.3–1.2)
Total Protein: 5.4 g/dL — ABNORMAL LOW (ref 6.5–8.1)

## 2017-05-14 LAB — CBC WITH DIFFERENTIAL/PLATELET
Basophils Absolute: 0 10*3/uL (ref 0.0–0.1)
Basophils Relative: 0 %
EOS PCT: 0 %
Eosinophils Absolute: 0 10*3/uL (ref 0.0–0.7)
HCT: 43.8 % (ref 39.0–52.0)
Hemoglobin: 14.5 g/dL (ref 13.0–17.0)
LYMPHS ABS: 0.8 10*3/uL (ref 0.7–4.0)
LYMPHS PCT: 3 %
MCH: 28.3 pg (ref 26.0–34.0)
MCHC: 33.1 g/dL (ref 30.0–36.0)
MCV: 85.4 fL (ref 78.0–100.0)
MONO ABS: 0.6 10*3/uL (ref 0.1–1.0)
MONOS PCT: 3 %
Neutro Abs: 22.6 10*3/uL — ABNORMAL HIGH (ref 1.7–7.7)
Neutrophils Relative %: 94 %
PLATELETS: 168 10*3/uL (ref 150–400)
RBC: 5.13 MIL/uL (ref 4.22–5.81)
RDW: 16.6 % — AB (ref 11.5–15.5)
WBC: 24 10*3/uL — ABNORMAL HIGH (ref 4.0–10.5)

## 2017-05-14 LAB — BRAIN NATRIURETIC PEPTIDE: B Natriuretic Peptide: 850.4 pg/mL — ABNORMAL HIGH (ref 0.0–100.0)

## 2017-05-14 LAB — URINALYSIS, ROUTINE W REFLEX MICROSCOPIC
BACTERIA UA: NONE SEEN
Glucose, UA: 50 mg/dL — AB
Hgb urine dipstick: NEGATIVE
KETONES UR: NEGATIVE mg/dL
Leukocytes, UA: NEGATIVE
Nitrite: NEGATIVE
Protein, ur: 100 mg/dL — AB
Specific Gravity, Urine: 1.027 (ref 1.005–1.030)
pH: 5 (ref 5.0–8.0)

## 2017-05-14 LAB — GLUCOSE, CAPILLARY
GLUCOSE-CAPILLARY: 113 mg/dL — AB (ref 65–99)
GLUCOSE-CAPILLARY: 126 mg/dL — AB (ref 65–99)
GLUCOSE-CAPILLARY: 124 mg/dL — AB (ref 65–99)

## 2017-05-14 LAB — C-REACTIVE PROTEIN: CRP: 22.5 mg/dL — AB (ref <1.0)

## 2017-05-14 LAB — SEDIMENTATION RATE: SED RATE: 20 mm/h — AB (ref 0–16)

## 2017-05-14 MED ORDER — TAMSULOSIN HCL 0.4 MG PO CAPS
0.4000 mg | ORAL_CAPSULE | Freq: Every day | ORAL | Status: DC
  Administered 2017-05-14 – 2017-05-20 (×7): 0.4 mg via ORAL
  Filled 2017-05-14 (×7): qty 1

## 2017-05-14 MED ORDER — SODIUM CHLORIDE 0.9 % IV BOLUS (SEPSIS)
1000.0000 mL | Freq: Once | INTRAVENOUS | Status: AC
  Administered 2017-05-14: 1000 mL via INTRAVENOUS

## 2017-05-14 MED ORDER — CEFTRIAXONE SODIUM 1 G IJ SOLR
1.0000 g | INTRAMUSCULAR | Status: DC
Start: 2017-05-14 — End: 2017-05-20
  Administered 2017-05-14 – 2017-05-20 (×7): 1 g via INTRAVENOUS
  Filled 2017-05-14 (×7): qty 10

## 2017-05-14 NOTE — Consult Note (Signed)
Reason for Consult: Abnormal liver tests Referring Physician: Hospital team  Bruce Green is an 83 y.o. male.  HPI: Patient seen and examined and hospital computer chart reviewed and office computer chart reviewed and he does not have a history of elevated liver tests and no liver problems run in the family and he really hasn't had any abdominal pain or weight loss and although he's noticed his stools have been lighter for about a month he only recently noticed his urine was dark and yesterday he was seen in the office with elevated liver tests and increased white count and he was admitted for further workup and plans and he only drinks minimal alcohol. He does use a fair amount of nonsteroidals for his shoulder but other then a Z-Pak the day before the liver tests he has not been started on any other new medicines and denies much other over-the-counter medicine and he has no other complaints and his case discussed with one of his daughters as well as the hospital Dr.  Past Medical History:  Diagnosis Date  . Anxiety   . Cataract   . CKD (chronic kidney disease)   . Colon polyp   . Coronary artery disease 04/21/2014     severe triple vessel  . Dyslipidemia   . GERD (gastroesophageal reflux disease)   . Hiatal hernia    6cm  . Ischemic cardiomyopathy 03/2014   EF 25-30%  . Pneumonia   . Stricture esophagus    distal  . TIA (transient ischemic attack)   . Vitamin D deficiency     Past Surgical History:  Procedure Laterality Date  . APPENDECTOMY    . CARDIAC SURGERY    . CHOLECYSTECTOMY    . CORONARY ARTERY BYPASS GRAFT N/A 04/24/2014   Procedure: CORONARY ARTERY BYPASS GRAFTING (CABG);  Surgeon: Gaye Pollack, MD;  Location: [place];  Service: Open Heart Surgery;  Laterality: N/A;  Times 3 using left internal mammary artery and endoscopically harvested right saphenous vein  . ESOPHAGOGASTRODUODENOSCOPY  04/09/2012   Procedure: ESOPHAGOGASTRODUODENOSCOPY (EGD);  Surgeon: Gatha Mayer,  MD;  Location: [facility];  Service: Endoscopy;  Laterality: N/A;  . INTRAOPERATIVE TRANSESOPHAGEAL ECHOCARDIOGRAM N/A 04/24/2014   Procedure: INTRAOPERATIVE TRANSESOPHAGEAL ECHOCARDIOGRAM;  Surgeon: Gaye Pollack, MD;  Location: [place] [facility];  Service: Open Heart Surgery;  Laterality: N/A;  . LEFT AND RIGHT HEART CATHETERIZATION WITH CORONARY ANGIOGRAM N/A 04/21/2014   Procedure: LEFT AND RIGHT HEART CATHETERIZATION WITH CORONARY ANGIOGRAM;  Surgeon: Burnell Blanks, MD;  Location: [facility] CATH LAB;  Service: Cardiovascular;  Laterality: N/A;  . SHOULDER SURGERY      Family History  Problem Relation Age of Onset  . Heart attack Mother   . Kidney disease Father   . Colon cancer Brother   . Dementia Brother   . Aneurysm Brother   . Breast cancer Sister        x 2  . Arthritis Sister   . Osteoporosis Sister     Social History:  reports that he has never smoked. He has never used smokeless tobacco. He reports that he drinks about 0.6 oz of alcohol per week . He reports that he does not use drugs.  Allergies:  Allergies  Allergen Reactions  . Sulfa Antibiotics Rash    Medications: I have reviewed the patient's current medications.  Results for orders placed or performed during the hospital encounter of 05/13/17 (from the past 48 hour(s))  Urinalysis, Routine w reflex microscopic  Status: Abnormal   Collection Time: 05/13/17  2:02 AM  Result Value Ref Range   Color, Urine AMBER (A) YELLOW    Comment: BIOCHEMICALS MAY BE AFFECTED BY COLOR   APPearance CLEAR CLEAR   Specific Gravity, Urine 1.027 1.005 - 1.030   pH 5.0 5.0 - 8.0   Glucose, UA 50 (A) NEGATIVE mg/dL   Hgb urine dipstick NEGATIVE NEGATIVE   Bilirubin Urine MODERATE (A) NEGATIVE   Ketones, ur NEGATIVE NEGATIVE mg/dL   Protein, ur 100 (A) NEGATIVE mg/dL   Nitrite NEGATIVE NEGATIVE   Leukocytes, UA NEGATIVE NEGATIVE   RBC / HPF 0-5 0 - 5 RBC/hpf   WBC, UA 0-5 0 - 5 WBC/hpf   Bacteria, UA NONE SEEN NONE SEEN    Squamous Epithelial / LPF 0-5 (A) NONE SEEN   Mucous PRESENT   Protime-INR     Status: Abnormal   Collection Time: 05/13/17  7:42 PM  Result Value Ref Range   Prothrombin Time 18.3 (H) 11.4 - 15.2 seconds   INR 1.50   CBC with Differential/Platelet     Status: Abnormal   Collection Time: 05/14/17  6:29 AM  Result Value Ref Range   WBC 24.0 (H) 4.0 - 10.5 K/uL   RBC 5.13 4.22 - 5.81 MIL/uL   Hemoglobin 14.5 13.0 - 17.0 g/dL   HCT 43.8 39.0 - 52.0 %   MCV 85.4 78.0 - 100.0 fL   MCH 28.3 26.0 - 34.0 pg   MCHC 33.1 30.0 - 36.0 g/dL   RDW 16.6 (H) 11.5 - 15.5 %   Platelets 168 150 - 400 K/uL   Neutrophils Relative % 94 %   Neutro Abs 22.6 (H) 1.7 - 7.7 K/uL   Lymphocytes Relative 3 %   Lymphs Abs 0.8 0.7 - 4.0 K/uL   Monocytes Relative 3 %   Monocytes Absolute 0.6 0.1 - 1.0 K/uL   Eosinophils Relative 0 %   Eosinophils Absolute 0.0 0.0 - 0.7 K/uL   Basophils Relative 0 %   Basophils Absolute 0.0 0.0 - 0.1 K/uL  Comprehensive metabolic panel     Status: Abnormal   Collection Time: 05/14/17  6:29 AM  Result Value Ref Range   Sodium 139 135 - 145 mmol/L   Potassium 4.4 3.5 - 5.1 mmol/L   Chloride 106 101 - 111 mmol/L   CO2 21 (L) 22 - 32 mmol/L   Glucose, Bld 120 (H) 65 - 99 mg/dL   BUN 32 (H) 6 - 20 mg/dL   Creatinine, Ser 1.17 0.61 - 1.24 mg/dL   Calcium 8.6 (L) 8.9 - 10.3 mg/dL   Total Protein 5.4 (L) 6.5 - 8.1 g/dL   Albumin 2.8 (L) 3.5 - 5.0 g/dL   AST 86 (H) 15 - 41 U/L   ALT 123 (H) 17 - 63 U/L   Alkaline Phosphatase 306 (H) 38 - 126 U/L   Total Bilirubin 6.1 (H) 0.3 - 1.2 mg/dL   GFR calc non Af Amer 51 (L) >60 mL/min   GFR calc Af Amer 59 (L) >60 mL/min    Comment: (NOTE) The eGFR has been calculated using the CKD EPI equation. This calculation has not been validated in all clinical situations. eGFR's persistently <60 mL/min signify possible Chronic Kidney Disease.    Anion gap 12 5 - 15  Glucose, capillary     Status: Abnormal   Collection Time: 05/14/17   7:54 AM  Result Value Ref Range   Glucose-Capillary 113 (H) 65 - 99 mg/dL  Brain natriuretic peptide     Status: Abnormal   Collection Time: 05/14/17 11:33 AM  Result Value Ref Range   B Natriuretic Peptide 850.4 (H) 0.0 - 100.0 pg/mL  TSH     Status: None   Collection Time: 05/14/17 11:33 AM  Result Value Ref Range   TSH 0.790 0.350 - 4.500 uIU/mL    Comment: Performed by a 3rd Generation assay with a functional sensitivity of <=0.01 uIU/mL.  Sedimentation rate     Status: Abnormal   Collection Time: 05/14/17 11:33 AM  Result Value Ref Range   Sed Rate 20 (H) 0 - 16 mm/hr  C-reactive protein     Status: Abnormal   Collection Time: 05/14/17 11:33 AM  Result Value Ref Range   CRP 22.5 (H) <1.0 mg/dL    US Abdomen Complete  Result Date: 05/13/2017 CLINICAL DATA:  83 year old male with elevated LFT and hyperbilirubinemia and gross hematuria. EXAM: ABDOMEN ULTRASOUND COMPLETE COMPARISON:  None. FINDINGS: Gallbladder: Cholecystectomy. Common bile duct: Diameter: 15 mm. Liver: There is an 8 x 8 x 8 mm echogenic lesion in the right lobe of the liver which is not well characterized but may represent a hemangioma. Other etiologies are not excluded. MRI may provide better characterization if clinically indicated. The liver is otherwise unremarkable as visualized. IVC: No abnormality visualized. Pancreas: Poorly visualized and obscured by bowel gas. Spleen: Size and appearance within normal limits. Right Kidney: Length: 12.3 cm. Echogenicity within normal limits. No hydronephrosis visualized. Left Kidney: Length: 12.3 cm. Echogenicity within normal limits. No hydronephrosis visualized. There is a 1.7 x 1.2 x 1.3 cm interpolar cyst. Abdominal aorta: There is atherosclerotic calcification of the aorta. There is aneurysmal dilatation of the midportion of the abdominal aorta measuring up to 3.7 cm. Other findings: None. IMPRESSION: 1. Subcentimeter right liver echogenic lesion, incompletely characterized,  possibly a hemangioma. MRI may provide better characterization if clinically indicated. 2. Cholecystectomy. 3. No hydronephrosis or echogenic stone. 4. Abdominal aortic aneurysm measures 3.7 cm. Recommend followup by ultrasound in 2 years. This recommendation follows ACR consensus guidelines: White Paper of the ACR Incidental Findings Committee II on Vascular Findings. J Am Coll Radiol 2013; 06:269-485. 5.  Aortic Atherosclerosis (ICD10-I70.0). Electronically Signed   By: Anner Crete M.D.   On: 05/13/2017 22:51    ROS negative except above Blood pressure (!) 115/91, pulse (!) 47, temperature 98.8 F (37.1 C), resp. rate 16, height '5\' 10"'$  (1.778 m), weight 68.8 kg (151 lb 9.6 oz), SpO2 96 %. Physical Exam vital signs stable afebrile no acute distress lungs are clear regular rate and rhythm abdomen is soft nontender ultrasound did show dilated CBD liver tests not quite as high as yesterday white count still elevated  Assessment/Plan: Abnormal liver tests questionably etiology Plan: Await tomorrow liver test to see what direction he is going and await MRCP to rule out obstruction with further workup and plans pending above  Nataliya Graig E 05/14/2017, 3:47 PM

## 2017-05-14 NOTE — Progress Notes (Signed)
[facility] Progress Note  Patient: Bruce Green QQV:[id number]   PCP: Kathyrn Lass, MD DOB: 07-31-1919   DOA: 05/13/2017   DOS: 05/14/2017   Date of Service: the patient was seen and examined on 05/14/2017  Subjective: Complains of severe pain in his left shoulder. On nausea and vomiting. Increasing shortness of breath over last 1 month as well as cough. No abdominal pain no diarrhea no constipation. No burning urination. Had some blood with urine at home.  Brief hospital course: Pt. with PMH of hypertension, chronic kidney disease, coronary artery disease, GERD, recent pneumonia; admitted on 05/13/2017, presented with complaint of abnormal lab, was found to have abnormal LFT as well as ongoing pneumonia. Currently further plan is continue further workup of abnormal LFT.  Assessment and Plan: 1. Abnormal liver function test. AST ALT elevated as well as bilirubin. Currently as compared to yesterday the labs are trending downwards. Ultrasound abdomen shows presence of a liver lesion and recommended MRI. GI consulted MRCP ordered. Follow-up on reports. Monitor liver function test. Hepatitis panel currently pending.  2. Community acquired pneumonia. Continue ceftriaxone and azithromycin. Severe leukocytosis up to check blood culture. X-ray shows mild evidence of pneumonia on the right side.  3. Left shoulder pain. X-ray shows no evidence of fracture likely muscular sprain. Physical therapy consulted. Continue pain control.  4. Renal cyst. No further workup at present recommended. Continue close monitoring.  5. Gross hematuria. On urinalysis here in the hospital no evidence of hematuria, hemoglobin undetectable. Likely hyperbilirubinemia causing bilirubinemia looking like hematuria. No further workup at present.  6. Coronary artery disease. Resume aspirin tomorrow. Hold statin. Monitor.  7. Orthostatic hypotension. Continue midodrine.  Diet: cardiac diet DVT  Prophylaxis: subcutaneous Heparin  Advance goals of care discussion: full code  Family Communication: family was present at bedside, at the time of interview. The pt provided permission to discuss medical plan with the family. Opportunity was given to ask question and all questions were answered satisfactorily.   Disposition:  Discharge to home.  Consultants: gastroenterology Procedures: none  Antibiotics: Anti-infectives    Start     Dose/Rate Route Frequency Ordered Stop   05/14/17 1000  azithromycin (ZITHROMAX) tablet 250 mg    Comments:  Take 2 tablets on day 1 then take 1 tablet a day thereafter.     250 mg Oral Daily 05/13/17 2012 05/16/17 0959   05/14/17 0130  cefTRIAXone (ROCEPHIN) 1 g in dextrose 5 % 50 mL IVPB     1 g 100 mL/hr over 30 Minutes Intravenous Every 24 hours 05/14/17 0121         Objective: Physical Exam: Vitals:   05/13/17 1900 05/14/17 0510 05/14/17 0700 05/14/17 1418  BP:  100/76  (!) 115/91  Pulse:  80  (!) 47  Resp: 15 18  16   Temp:  97.7 F (36.5 C)  98.8 F (37.1 C)  TempSrc: Oral Oral    SpO2:  100%  96%  Weight:   68.8 kg (151 lb 9.6 oz)   Height:        Intake/Output Summary (Last 24 hours) at 05/14/17 1709 Last data filed at 05/14/17 0604  Gross per 24 hour  Intake           768.33 ml  Output             1000 ml  Net          -231.67 ml   Filed Weights   05/13/17 1822 05/14/17 0700  Weight:  68.3 kg (150 lb 9.2 oz) 68.8 kg (151 lb 9.6 oz)   General: Alert, Awake and Oriented to Time, Place and Person. Appear in mild distress, affect appropriate Eyes: PERRL, Conjunctiva normal ENT: Oral Mucosa clear moist. Neck: no JVD, no Abnormal Mass Or lumps Cardiovascular: S1 and S2 Present, no Murmur, Peripheral Pulses Present Respiratory: normal respiratory effort, Bilateral Air entry equal and Decreased, no use of accessory muscle, bilateral Crackles, no wheezes Abdomen: Bowel Sound present, Soft and no tenderness, no hernia Skin: no  redness, no Rash, no induration Extremities: no Pedal edema, no calf tenderness Neurologic: Grossly no focal neuro deficit. Bilaterally Equal motor strength  Data Reviewed: CBC:  Recent Labs Lab 05/14/17 0629  WBC 24.0*  NEUTROABS 22.6*  HGB 14.5  HCT 43.8  MCV 85.4  PLT 179   Basic Metabolic Panel:  Recent Labs Lab 05/14/17 0629  NA 139  K 4.4  CL 106  CO2 21*  GLUCOSE 120*  BUN 32*  CREATININE 1.17  CALCIUM 8.6*    Liver Function Tests:  Recent Labs Lab 05/14/17 0629  AST 86*  ALT 123*  ALKPHOS 306*  BILITOT 6.1*  PROT 5.4*  ALBUMIN 2.8*   No results for input(s): LIPASE, AMYLASE in the last 168 hours. No results for input(s): AMMONIA in the last 168 hours. Coagulation Profile:  Recent Labs Lab 05/13/17 1942  INR 1.50   Cardiac Enzymes: No results for input(s): CKTOTAL, CKMB, CKMBINDEX, TROPONINI in the last 168 hours. BNP (last 3 results) No results for input(s): PROBNP in the last 8760 hours. CBG:  Recent Labs Lab 05/14/17 0754  GLUCAP 113*   Studies: Dg Chest 2 View  Result Date: 05/14/2017 CLINICAL DATA:  Pain in the left shoulder and productive cough EXAM: CHEST  2 VIEW COMPARISON:  May 12, 2017 FINDINGS: The mediastinal contour is normal. Patient status post prior median sternotomy and CABG. The heart size is enlarged. Mild patchy opacity is identified in the right mid and lung base. There is no pulmonary edema. There is minimal left pleural effusion. The visualized skeletal structures are stable. IMPRESSION: Mild patchy opacity of the right mid and lung base, developing pneumonia is not excluded. Electronically Signed   By: Abelardo Diesel M.D.   On: 05/14/2017 16:52   US Abdomen Complete  Result Date: 05/13/2017 CLINICAL DATA:  83 year old male with elevated LFT and hyperbilirubinemia and gross hematuria. EXAM: ABDOMEN ULTRASOUND COMPLETE COMPARISON:  None. FINDINGS: Gallbladder: Cholecystectomy. Common bile duct: Diameter: 15 mm. Liver:  There is an 8 x 8 x 8 mm echogenic lesion in the right lobe of the liver which is not well characterized but may represent a hemangioma. Other etiologies are not excluded. MRI may provide better characterization if clinically indicated. The liver is otherwise unremarkable as visualized. IVC: No abnormality visualized. Pancreas: Poorly visualized and obscured by bowel gas. Spleen: Size and appearance within normal limits. Right Kidney: Length: 12.3 cm. Echogenicity within normal limits. No hydronephrosis visualized. Left Kidney: Length: 12.3 cm. Echogenicity within normal limits. No hydronephrosis visualized. There is a 1.7 x 1.2 x 1.3 cm interpolar cyst. Abdominal aorta: There is atherosclerotic calcification of the aorta. There is aneurysmal dilatation of the midportion of the abdominal aorta measuring up to 3.7 cm. Other findings: None. IMPRESSION: 1. Subcentimeter right liver echogenic lesion, incompletely characterized, possibly a hemangioma. MRI may provide better characterization if clinically indicated. 2. Cholecystectomy. 3. No hydronephrosis or echogenic stone. 4. Abdominal aortic aneurysm measures 3.7 cm. Recommend followup by ultrasound in 2  years. This recommendation follows ACR consensus guidelines: White Paper of the ACR Incidental Findings Committee II on Vascular Findings. J Am Coll Radiol 2013; 11:886-773. 5.  Aortic Atherosclerosis (ICD10-I70.0). Electronically Signed   By: Anner Crete M.D.   On: 05/13/2017 22:51   Dg Shoulder Left  Result Date: 05/14/2017 CLINICAL DATA:  Left shoulder pain EXAM: LEFT SHOULDER - 2+ VIEW COMPARISON:  None. FINDINGS: There is no evidence of fracture or dislocation. Degenerative joint changes of the left acromioclavicular joint is noted. Soft tissues are unremarkable. IMPRESSION: No acute fracture or dislocation. Electronically Signed   By: Abelardo Diesel M.D.   On: 05/14/2017 16:55    Scheduled Meds: . azithromycin  250 mg Oral Daily  . B-complex with  vitamin C  1 tablet Oral Daily  . calcium carbonate  1 tablet Oral Daily  . docusate sodium  100 mg Oral Daily  . folic acid  1 mg Oral Daily  . insulin aspart  0-9 Units Subcutaneous TID WC  . loratadine  10 mg Oral Daily  . magnesium oxide  200 mg Oral Daily  . midodrine  2.5 mg Oral Daily  . pantoprazole  40 mg Oral Q1200  . tamsulosin  0.4 mg Oral QPC supper   Continuous Infusions: . cefTRIAXone (ROCEPHIN)  IV Stopped (05/14/17 0244)   PRN Meds: acetaminophen **OR** acetaminophen, bisacodyl, fentaNYL (SUBLIMAZE) injection, HYDROcodone-acetaminophen, LORazepam, ondansetron **OR** ondansetron (ZOFRAN) IV, polyethylene glycol  Time spent: 35 minutes  Author: Berle Mull, MD [facility] Pager: [phone] 05/14/2017 5:09 PM  If 7PM-7AM, please contact night-coverage at [email or web address], password [facility]

## 2017-05-15 LAB — GLUCOSE, CAPILLARY
GLUCOSE-CAPILLARY: 112 mg/dL — AB (ref 65–99)
GLUCOSE-CAPILLARY: 130 mg/dL — AB (ref 65–99)
GLUCOSE-CAPILLARY: 156 mg/dL — AB (ref 65–99)
Glucose-Capillary: 162 mg/dL — ABNORMAL HIGH (ref 65–99)
Glucose-Capillary: 140 mg/dL — ABNORMAL HIGH (ref 65–99)
Glucose-Capillary: 134 mg/dL — ABNORMAL HIGH (ref 65–99)

## 2017-05-15 LAB — CBC WITH DIFFERENTIAL/PLATELET
Basophils Absolute: 0 10*3/uL (ref 0.0–0.1)
Basophils Relative: 0 %
EOS ABS: 0 10*3/uL (ref 0.0–0.7)
EOS PCT: 0 %
HCT: 40.1 % (ref 39.0–52.0)
Hemoglobin: 13.2 g/dL (ref 13.0–17.0)
LYMPHS PCT: 3 %
Lymphs Abs: 0.6 10*3/uL — ABNORMAL LOW (ref 0.7–4.0)
MCH: 27.7 pg (ref 26.0–34.0)
MCHC: 32.9 g/dL (ref 30.0–36.0)
MCV: 84.2 fL (ref 78.0–100.0)
MONO ABS: 0.5 10*3/uL (ref 0.1–1.0)
Monocytes Relative: 3 %
Neutro Abs: 16.2 10*3/uL — ABNORMAL HIGH (ref 1.7–7.7)
Neutrophils Relative %: 94 %
PLATELETS: 163 10*3/uL (ref 150–400)
RBC: 4.76 MIL/uL (ref 4.22–5.81)
RDW: 16.5 % — AB (ref 11.5–15.5)
WBC: 17.3 10*3/uL — AB (ref 4.0–10.5)

## 2017-05-15 LAB — HEPATITIS PANEL, ACUTE
HCV Ab: <0.1 s/co ratio (ref 0.0–0.9)
HEP B C IGM: NEGATIVE
Hep A IgM: NEGATIVE
Hepatitis B Surface Ag: NEGATIVE

## 2017-05-15 LAB — MAGNESIUM: MAGNESIUM: 1.9 mg/dL (ref 1.7–2.4)

## 2017-05-15 LAB — COMPREHENSIVE METABOLIC PANEL
ALK PHOS: 244 U/L — AB (ref 38–126)
ALT: 81 U/L — ABNORMAL HIGH (ref 17–63)
AST: 44 U/L — AB (ref 15–41)
Albumin: 2.3 g/dL — ABNORMAL LOW (ref 3.5–5.0)
Anion gap: 7 (ref 5–15)
BILIRUBIN TOTAL: 3.1 mg/dL — AB (ref 0.3–1.2)
BUN: 29 mg/dL — AB (ref 6–20)
CALCIUM: 8.4 mg/dL — AB (ref 8.9–10.3)
CO2: 23 mmol/L (ref 22–32)
CREATININE: 1.19 mg/dL (ref 0.61–1.24)
Chloride: 106 mmol/L (ref 101–111)
GFR calc Af Amer: 58 mL/min — ABNORMAL LOW (ref >60)
GFR calc non Af Amer: 50 mL/min — ABNORMAL LOW (ref >60)
Glucose, Bld: 131 mg/dL — ABNORMAL HIGH (ref 65–99)
POTASSIUM: 4 mmol/L (ref 3.5–5.1)
Sodium: 136 mmol/L (ref 135–145)
TOTAL PROTEIN: 5 g/dL — AB (ref 6.5–8.1)

## 2017-05-15 LAB — PROTIME-INR
INR: 1.25
Prothrombin Time: 15.7 seconds — ABNORMAL HIGH (ref 11.4–15.2)

## 2017-05-15 LAB — URINE CULTURE: CULTURE: NO GROWTH

## 2017-05-15 LAB — HEMOGLOBIN A1C
Hgb A1c MFr Bld: 6.4 % — ABNORMAL HIGH (ref 4.8–5.6)
MEAN PLASMA GLUCOSE: 137 mg/dL

## 2017-05-15 MED ORDER — AZITHROMYCIN 250 MG PO TABS
500.0000 mg | ORAL_TABLET | Freq: Every day | ORAL | Status: DC
  Administered 2017-05-16 – 2017-05-19 (×5): 500 mg via ORAL
  Filled 2017-05-15 (×5): qty 2

## 2017-05-15 MED ORDER — METOPROLOL TARTRATE 5 MG/5ML IV SOLN
5.0000 mg | INTRAVENOUS | Status: DC | PRN
  Administered 2017-05-15: 5 mg via INTRAVENOUS
  Filled 2017-05-15: qty 5

## 2017-05-15 MED ORDER — METOPROLOL TARTRATE 5 MG/5ML IV SOLN
5.0000 mg | INTRAVENOUS | Status: DC | PRN
  Filled 2017-05-15: qty 5

## 2017-05-15 MED ORDER — DILTIAZEM HCL 100 MG IV SOLR
5.0000 mg/h | INTRAVENOUS | Status: DC
  Administered 2017-05-15 – 2017-05-16 (×2): 5 mg/h via INTRAVENOUS
  Filled 2017-05-15 (×2): qty 100

## 2017-05-15 MED ORDER — METOPROLOL TARTRATE 25 MG PO TABS
25.0000 mg | ORAL_TABLET | Freq: Four times a day (QID) | ORAL | Status: DC
  Administered 2017-05-15: 25 mg via ORAL
  Filled 2017-05-15: qty 1

## 2017-05-15 MED ORDER — DILTIAZEM HCL 25 MG/5ML IV SOLN
10.0000 mg | Freq: Once | INTRAVENOUS | Status: AC
  Administered 2017-05-15: 10 mg via INTRAVENOUS
  Filled 2017-05-15: qty 5

## 2017-05-15 MED ORDER — SODIUM CHLORIDE 0.9 % IV BOLUS (SEPSIS)
500.0000 mL | Freq: Once | INTRAVENOUS | Status: AC
  Administered 2017-05-15: 500 mL via INTRAVENOUS

## 2017-05-15 MED ORDER — METOPROLOL TARTRATE 25 MG PO TABS: 25.0000 mg | ORAL_TABLET | Freq: Four times a day (QID) | ORAL | Status: DC

## 2017-05-15 MED ORDER — MAGNESIUM SULFATE 2 GM/50ML IV SOLN
2.0000 g | Freq: Once | INTRAVENOUS | Status: AC
  Administered 2017-05-15: 2 g via INTRAVENOUS
  Filled 2017-05-15: qty 50

## 2017-05-15 MED ORDER — METOPROLOL TARTRATE 5 MG/5ML IV SOLN
2.5000 mg | INTRAVENOUS | Status: DC | PRN
  Administered 2017-05-15 (×2): 5 mg via INTRAVENOUS
  Filled 2017-05-15 (×2): qty 5

## 2017-05-15 NOTE — Progress Notes (Signed)
EKG completed. Labs drawn and sent. Attached to continuous telemetry as ordered. MD in to see pt prior to these orders being completed. MD called and made aware of difficulty  Reading rhythm. Orders fro bolus and Lopressor received.

## 2017-05-15 NOTE — Progress Notes (Signed)
[facility] Progress Note  Patient: Bruce Green JOI:[id number]   PCP: Kathyrn Lass, MD DOB: [birth year]   DOA: 05/13/2017   DOS: 05/15/2017   Date of Service: the patient was seen and examined on 05/15/2017  Subjective: Shoulder pain has resolved. Continues to have shortness of breath as well as increased sweating. Finger no abdominal pain. No diarrhea. Urinary retention actually has resolved.  Brief hospital course: Pt. with PMH of hypertension, chronic kidney disease, coronary artery disease, GERD, recent pneumonia; admitted on 05/13/2017, presented with complaint of abnormal lab, was found to have abnormal LFT as well as ongoing pneumonia. Currently further plan is continue further workup of abnormal LFT.  Assessment and Plan: 1. Abnormal liver function test. AST ALT elevated as well as bilirubin. Currently as compared to yesterday the labs are trending downwards. Ultrasound abdomen shows presence of a liver lesion and recommended MRI. GI consulted MRCP ordered. Follow-up on reports. Monitor liver function test. Hepatitis panel currently pending.  2. Community acquired pneumonia. Continue ceftriaxone and azithromycin. Severe leukocytosis, monitor blood culture. X-ray shows mild evidence of pneumonia on the right side.  3. Left shoulder pain. X-ray shows no evidence of fracture likely muscular sprain. Physical therapy consulted. Continue pain control.  4. Renal cyst. No further workup at present recommended. Continue close monitoring.  5. Gross hematuria. On urinalysis here in the hospital no evidence of hematuria, hemoglobin undetectable. Likely hyperbilirubinemia causing bilirubinemia looking like hematuria. No further workup at present.  6. Coronary artery disease. Resume aspirin tomorrow. Hold statin. Monitor.  7. Orthostatic hypotension. Stop midodrine.  8. A. fib with RVR. Currently rate controlled strategy. Patient was given multiple liters of IV  Lopressor which was not effective. Will start the patient on Cardizem infusion. Transition to by mouth Cardizem as well as her control is achieved. Follow up on echocardiogram. May need cardiology.  Diet: cardiac diet DVT Prophylaxis: subcutaneous Heparin  Advance goals of care discussion: full code  Family Communication: family was present at bedside, at the time of interview. The pt provided permission to discuss medical plan with the family. Opportunity was given to ask question and all questions were answered satisfactorily.   Disposition:  Discharge to home.  Consultants: gastroenterology Procedures: none  Antibiotics: Anti-infectives    Start     Dose/Rate Route Frequency Ordered Stop   05/14/17 1000  azithromycin (ZITHROMAX) tablet 250 mg    Comments:  Take 2 tablets on day 1 then take 1 tablet a day thereafter.     250 mg Oral Daily 05/13/17 2012 05/15/17 0957   05/14/17 0130  cefTRIAXone (ROCEPHIN) 1 g in dextrose 5 % 50 mL IVPB     1 g 100 mL/hr over 30 Minutes Intravenous Every 24 hours 05/14/17 0121         Objective: Physical Exam: Vitals:   05/15/17 0809 05/15/17 1224 05/15/17 1330 05/15/17 1510  BP:  107/77 100/70 104/79  Pulse:  (!) 148    Resp:      Temp:    98 F (36.7 C)  TempSrc:    Oral  SpO2:    96%  Weight: 72.8 kg (160 lb 8 oz)     Height:        Intake/Output Summary (Last 24 hours) at 05/15/17 1957 Last data filed at 05/15/17 1415  Gross per 24 hour  Intake             1220 ml  Output  0 ml  Net             1220 ml   Filed Weights   05/13/17 1822 05/14/17 0700 05/15/17 0809  Weight: 68.3 kg (150 lb 9.2 oz) 68.8 kg (151 lb 9.6 oz) 72.8 kg (160 lb 8 oz)   General: Alert, Awake and Oriented to Time, Place and Person. Appear in mild distress, affect appropriate Eyes: PERRL, Conjunctiva normal ENT: Oral Mucosa clear moist. Neck: no JVD, no Abnormal Mass Or lumps Cardiovascular: S1 and S2 Present, no Murmur, Peripheral  Pulses Present Respiratory: normal respiratory effort, Bilateral Air entry equal and Decreased, no use of accessory muscle, bilateral Crackles, no wheezes Abdomen: Bowel Sound present, Soft and no tenderness, no hernia Skin: no redness, no Rash, no induration Extremities: no Pedal edema, no calf tenderness Neurologic: Grossly no focal neuro deficit. Bilaterally Equal motor strength  Data Reviewed: CBC:  Recent Labs Lab 05/14/17 0629 05/15/17 0506  WBC 24.0* 17.3*  NEUTROABS 22.6* 16.2*  HGB 14.5 13.2  HCT 43.8 40.1  MCV 85.4 84.2  PLT 168 893   Basic Metabolic Panel:  Recent Labs Lab 05/14/17 0629 05/15/17 0506  NA 139 136  K 4.4 4.0  CL 106 106  CO2 21* 23  GLUCOSE 120* 131*  BUN 32* 29*  CREATININE 1.17 1.19  CALCIUM 8.6* 8.4*  MG  --  1.9    Liver Function Tests:  Recent Labs Lab 05/14/17 0629 05/15/17 0506  AST 86* 44*  ALT 123* 81*  ALKPHOS 306* 244*  BILITOT 6.1* 3.1*  PROT 5.4* 5.0*  ALBUMIN 2.8* 2.3*   No results for input(s): LIPASE, AMYLASE in the last 168 hours. No results for input(s): AMMONIA in the last 168 hours. Coagulation Profile:  Recent Labs Lab 05/13/17 1942 05/15/17 0506  INR 1.50 1.25   Cardiac Enzymes: No results for input(s): CKTOTAL, CKMB, CKMBINDEX, TROPONINI in the last 168 hours. BNP (last 3 results) No results for input(s): PROBNP in the last 8760 hours. CBG:  Recent Labs Lab 05/14/17 2202 05/15/17 0836 05/15/17 1215 05/15/17 1514 05/15/17 1624  GLUCAP 124* 112* 140* 134* 130*   Studies: No results found.  Scheduled Meds: . B-complex with vitamin C  1 tablet Oral Daily  . calcium carbonate  1 tablet Oral Daily  . docusate sodium  100 mg Oral Daily  . folic acid  1 mg Oral Daily  . insulin aspart  0-9 Units Subcutaneous TID WC  . loratadine  10 mg Oral Daily  . magnesium oxide  200 mg Oral Daily  . pantoprazole  40 mg Oral Q1200  . tamsulosin  0.4 mg Oral QPC supper   Continuous Infusions: .  cefTRIAXone (ROCEPHIN)  IV Stopped (05/15/17 0118)  . diltiazem (CARDIZEM) infusion 5 mg/hr (05/15/17 1539)   PRN Meds: acetaminophen **OR** acetaminophen, bisacodyl, fentaNYL (SUBLIMAZE) injection, HYDROcodone-acetaminophen, LORazepam, metoprolol tartrate, ondansetron **OR** ondansetron (ZOFRAN) IV, polyethylene glycol  Time spent: 35 minutes  Author: Berle Mull, MD [facility] Pager: [phone] 05/15/2017 7:57 PM  If 7PM-7AM, please contact night-coverage at [email or web address], password [email or web address]

## 2017-05-15 NOTE — Progress Notes (Signed)
Tolerated 1st dose of 5mg   Lopressor and bolus. HR remains 136, 2nd dose lopressor administered.

## 2017-05-15 NOTE — Progress Notes (Signed)
[place] 9:34 AM  Subjective: Patient doing fine without any complaints and does say bowels are dark urine urine is later and has no GI complaints  Objective: Vital signs stable afebrile no acute distress abdomen is soft nontender white count and LFTs improved  Assessment: Seemingly resolving abnormal liver tests questionably etiology  Plan: Await MRCP  [facility]  Pager [phone] After 5PM or if no answer call [phone]

## 2017-05-15 NOTE — Progress Notes (Signed)
HR sustained 150-160's. Asymptomatic, calm and resting quietly. Appears regular although distant and could be irregular. Paged night coverage MD. Labs due at this time, Phlebotomist on unit getting am labs.

## 2017-05-16 ENCOUNTER — Inpatient Hospital Stay (HOSPITAL_COMMUNITY): Payer: Medicare Other

## 2017-05-16 DIAGNOSIS — I360 Nonrheumatic tricuspid (valve) stenosis: Secondary | ICD-10-CM

## 2017-05-16 DIAGNOSIS — I2510 Atherosclerotic heart disease of native coronary artery without angina pectoris: Secondary | ICD-10-CM

## 2017-05-16 DIAGNOSIS — I481 Persistent atrial fibrillation: Secondary | ICD-10-CM

## 2017-05-16 LAB — CBC WITH DIFFERENTIAL/PLATELET
BASOS PCT: 0 %
Basophils Absolute: 0 10*3/uL (ref 0.0–0.1)
Eosinophils Absolute: 0 10*3/uL (ref 0.0–0.7)
Eosinophils Relative: 0 %
HEMATOCRIT: 37.3 % — AB (ref 39.0–52.0)
Hemoglobin: 12.2 g/dL — ABNORMAL LOW (ref 13.0–17.0)
LYMPHS ABS: 0.7 10*3/uL (ref 0.7–4.0)
Lymphocytes Relative: 5 %
MCH: 27.3 pg (ref 26.0–34.0)
MCHC: 32.7 g/dL (ref 30.0–36.0)
MCV: 83.4 fL (ref 78.0–100.0)
MONO ABS: 0.8 10*3/uL (ref 0.1–1.0)
MONOS PCT: 5 %
NEUTROS ABS: 14 10*3/uL — AB (ref 1.7–7.7)
Neutrophils Relative %: 90 %
Platelets: 180 10*3/uL (ref 150–400)
RBC: 4.47 MIL/uL (ref 4.22–5.81)
RDW: 16.3 % — AB (ref 11.5–15.5)
WBC: 15.5 10*3/uL — ABNORMAL HIGH (ref 4.0–10.5)

## 2017-05-16 LAB — COMPREHENSIVE METABOLIC PANEL
ALBUMIN: 2.2 g/dL — AB (ref 3.5–5.0)
ALT: 58 U/L (ref 17–63)
ANION GAP: 8 (ref 5–15)
AST: 24 U/L (ref 15–41)
Alkaline Phosphatase: 207 U/L — ABNORMAL HIGH (ref 38–126)
BUN: 39 mg/dL — ABNORMAL HIGH (ref 6–20)
CALCIUM: 8.2 mg/dL — AB (ref 8.9–10.3)
CO2: 22 mmol/L (ref 22–32)
Chloride: 105 mmol/L (ref 101–111)
Creatinine, Ser: 1.23 mg/dL (ref 0.61–1.24)
GFR, EST AFRICAN AMERICAN: 56 mL/min — AB (ref >60)
GFR, EST NON AFRICAN AMERICAN: 48 mL/min — AB (ref >60)
Glucose, Bld: 131 mg/dL — ABNORMAL HIGH (ref 65–99)
Potassium: 4 mmol/L (ref 3.5–5.1)
Sodium: 135 mmol/L (ref 135–145)
TOTAL PROTEIN: 5 g/dL — AB (ref 6.5–8.1)
Total Bilirubin: 2.1 mg/dL — ABNORMAL HIGH (ref 0.3–1.2)

## 2017-05-16 LAB — GLUCOSE, CAPILLARY
GLUCOSE-CAPILLARY: 115 mg/dL — AB (ref 65–99)
GLUCOSE-CAPILLARY: 143 mg/dL — AB (ref 65–99)
GLUCOSE-CAPILLARY: 121 mg/dL — AB (ref 65–99)
GLUCOSE-CAPILLARY: 171 mg/dL — AB (ref 65–99)

## 2017-05-16 LAB — ECHOCARDIOGRAM COMPLETE
Height: 70 in
Weight: 2492.8 oz

## 2017-05-16 MED ORDER — GUAIFENESIN ER 600 MG PO TB12
600.0000 mg | ORAL_TABLET | Freq: Two times a day (BID) | ORAL | Status: DC
  Administered 2017-05-16 – 2017-05-21 (×10): 600 mg via ORAL
  Filled 2017-05-16 (×10): qty 1

## 2017-05-16 MED ORDER — AMIODARONE HCL IN DEXTROSE 360-4.14 MG/200ML-% IV SOLN
30.0000 mg/h | INTRAVENOUS | Status: DC
  Administered 2017-05-16 – 2017-05-20 (×7): 30 mg/h via INTRAVENOUS
  Filled 2017-05-16 (×6): qty 200

## 2017-05-16 MED ORDER — GADOBENATE DIMEGLUMINE 529 MG/ML IV SOLN
15.0000 mL | Freq: Once | INTRAVENOUS | Status: AC | PRN
  Administered 2017-05-16: 15 mL via INTRAVENOUS

## 2017-05-16 MED ORDER — CARVEDILOL 3.125 MG PO TABS
3.1250 mg | ORAL_TABLET | Freq: Two times a day (BID) | ORAL | Status: DC
  Administered 2017-05-16 – 2017-05-21 (×9): 3.125 mg via ORAL
  Filled 2017-05-16 (×13): qty 1

## 2017-05-16 MED ORDER — AMIODARONE HCL IN DEXTROSE 360-4.14 MG/200ML-% IV SOLN
60.0000 mg/h | INTRAVENOUS | Status: AC
  Administered 2017-05-16: 60 mg/h via INTRAVENOUS
  Filled 2017-05-16 (×2): qty 200

## 2017-05-16 NOTE — Progress Notes (Signed)
Subjective: Some shortness of breath. No abdominal pain.  Objective: Vital signs in last 24 hours: Temp:  [97.5 F (36.4 C)-98.2 F (36.8 C)] 97.5 F (36.4 C) (06/23 0831) Pulse Rate:  [45-148] 70 (06/23 0649) Resp:  [18-30] 27 (06/23 0649) BP: (86-115)/(59-85) 97/59 (06/23 0831) SpO2:  [96 %-99 %] 97 % (06/23 0831) Weight:  [70.7 kg (155 lb 12.8 oz)] 70.7 kg (155 lb 12.8 oz) (06/23 0444) Weight change:  Last BM Date: 05/14/17  PE: GEN:  Alert, mild respiratory distress at rest, younger-appearing than stated age ABD:  Soft, non-tender SKIN:  Jaundiced  Lab Results: CBC    Component Value Date/Time   WBC 15.5 (H) 05/16/2017 0333   RBC 4.47 05/16/2017 0333   HGB 12.2 (L) 05/16/2017 0333   HCT 37.3 (L) 05/16/2017 0333   PLT 180 05/16/2017 0333   MCV 83.4 05/16/2017 0333   MCH 27.3 05/16/2017 0333   MCHC 32.7 05/16/2017 0333   RDW 16.3 (H) 05/16/2017 0333   LYMPHSABS 0.7 05/16/2017 0333   MONOABS 0.8 05/16/2017 0333   EOSABS 0.0 05/16/2017 0333   BASOSABS 0.0 05/16/2017 0333   CMP     Component Value Date/Time   NA 135 05/16/2017 0333   K 4.0 05/16/2017 0333   CL 105 05/16/2017 0333   CO2 22 05/16/2017 0333   GLUCOSE 131 (H) 05/16/2017 0333   BUN 39 (H) 05/16/2017 0333   CREATININE 1.23 05/16/2017 0333   CREATININE 1.28 (H) 10/09/2016 0920   CALCIUM 8.2 (L) 05/16/2017 0333   PROT 5.0 (L) 05/16/2017 0333   ALBUMIN 2.2 (L) 05/16/2017 0333   AST 24 05/16/2017 0333   ALT 58 05/16/2017 0333   ALKPHOS 207 (H) 05/16/2017 0333   BILITOT 2.1 (H) 05/16/2017 0333   GFRNONAA 48 (L) 05/16/2017 0333   GFRNONAA 69 05/17/2014 1015   GFRAA 56 (L) 05/16/2017 0333   GFRAA 80 05/17/2014 1015   Assessment:  1.  Elevated LFTs with dilated bile duct.  No cholangitis clinically apparent.  MRCP read pending but by my read appears he appears to have several CBD stones and periampullary diverticulum. 2.  Respiratory distress (rapid A fib and pneumonia). 3.  Rapid atrial  fibrillation.  Plan:  1.  Clear liquids, continue antibiotics, await MRCP findings. 2.  If my interpretation is confirmed on MRCP, patient will need significant cardiorespiratory tune-up prior to consideration of ERCP. 3.  [facility] will follow.   Bruce Green, Bruce Green 05/16/2017, 9:42 AM

## 2017-05-16 NOTE — Plan of Care (Signed)
Problem: Safety: Goal: Ability to remain free from injury will improve Outcome: Progressing Patient has made several attempts to get out of bed. Educated and encouraged to utilize the call bell to call for assistance. Call bell within reach.

## 2017-05-16 NOTE — Progress Notes (Signed)
[facility] Progress Note  Patient: Bruce Green MWN:[id number]   PCP: Kathyrn Lass, MD DOB: [birth year]   DOA: 05/13/2017   DOS: 05/16/2017   Date of Service: the patient was seen and examined on 05/16/2017  Subjective: No acute complaint. No Abdominal pain, no nausea no vomiting. Mild shortness of breath as well as dizziness when ambulation. No diarrhea no constipation. Shoulder pain is totally resolved.  Brief hospital course: Pt. with PMH of hypertension, chronic kidney disease, coronary artery disease, GERD, recent pneumonia; admitted on 05/13/2017, presented with complaint of abnormal lab, was found to have abnormal LFT as well as ongoing pneumonia. MRCP is suggesting CBD stone, GI consulted for ERCP but will require cardiac clearance and stabilization. Currently further plan is to stabilize and consider ERCP for further workup.  Assessment and Plan: 1. CBD stone with obstruction. Transaminitis AST ALT elevated as well as bilirubin. Currently LFT are trending downward Ultrasound abdomen shows presence of a liver lesion and MRI negative for any liver mass or pancreatic mass. GI currently following, consider ERCP but request cardio pulmonary stabilization prior to the procedure. Hepatitis panel negative.  2. Community acquired pneumonia. Continue ceftriaxone and azithromycin. Severe leukocytosis, blood cultures no growth to now. X-ray shows mild evidence of pneumonia on the right side.  3. Left shoulder pain. X-ray shows no evidence of fracture likely muscular sprain. Physical therapy consulted. Continue pain control.  4. Renal cyst. MRI suggest small simple parapelvic renal cyst in the left kidneys as well as right kidney. No further workup. No suspicious mass.  5. C/o Gross hematuria. On urinalysis here in the hospital no evidence of hematuria, hemoglobin undetectable. Likely hyperbilirubinemia causing bilirubinemia looking like hematuria. No further workup at  present.  6. Coronary artery disease. Resume aspirin Hold statin. Monitor.  7. Orthostatic hypotension. Stop midodrine due to A. fib with RVR  8. A. fib with RVR.  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  Patient was given IV Lopressor as well as by mouth Lopressor every 6 hours which did not work for rate control. Cardizem drip was also started that the patient was not a to tolerate due to hypotension and also not indicated due to low EF. Cardiology was consulted, transitioning to amiodarone for rate control. Currently holding anticoagulation until GI workup is completed. 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 Starting on low-dose Coreg. May also require diuresis. Appreciate cardiology input  Diet: cardiac diet DVT Prophylaxis: subcutaneous Heparin  Advance goals of care discussion: full code  Family Communication: family was present at bedside, at the time of interview. The pt provided permission to discuss medical plan with the family. Opportunity was given to ask question and all questions were answered satisfactorily.   Disposition:  Discharge to home.  Consultants: gastroenterology, cardiology Procedures: Echocardiogram  Antibiotics: Anti-infectives    Start     Dose/Rate Route Frequency Ordered Stop   05/15/17 2100  azithromycin (ZITHROMAX) tablet 500 mg     500 mg Oral Daily 05/15/17 1958     05/14/17 1000  azithromycin (ZITHROMAX) tablet 250 mg    Comments:  Take 2 tablets on day 1 then take 1 tablet a day thereafter.     250 mg Oral Daily 05/13/17 2012 05/15/17 0957   05/14/17 0130  cefTRIAXone (ROCEPHIN) 1 g in dextrose 5 % 50 mL IVPB     1 g 100 mL/hr over 30 Minutes Intravenous Every 24 hours 05/14/17 0121  Objective: Physical Exam: Vitals:   05/16/17 0649 05/16/17 0831 05/16/17 1227 05/16/17 1713  BP: 108/85 (!) 97/59 101/65 116/78  Pulse:  70     Resp: (!) 27     Temp:  97.5 F (36.4 C) 98.6 F (37 C) 97.4 F (36.3 C)  TempSrc:  Oral Oral Oral  SpO2: 99% 97% 94% 98%  Weight:      Height:        Intake/Output Summary (Last 24 hours) at 05/16/17 1753 Last data filed at 05/16/17 0940  Gross per 24 hour  Intake           362.17 ml  Output              100 ml  Net           262.17 ml   Filed Weights   05/14/17 0700 05/15/17 0809 05/16/17 0444  Weight: 68.8 kg (151 lb 9.6 oz) 72.8 kg (160 lb 8 oz) 70.7 kg (155 lb 12.8 oz)   General: Alert, Awake and Oriented to Time, Place and Person. Appear in mild distress, affect appropriate Eyes: PERRL, Conjunctiva normal ENT: Oral Mucosa clear moist. Neck: no JVD, no Abnormal Mass Or lumps Cardiovascular: S1 and S2 Present, no Murmur, Peripheral Pulses Present Respiratory: normal respiratory effort, Bilateral Air entry equal and Decreased, no use of accessory muscle, bilateral Crackles, no wheezes Abdomen: Bowel Sound present, Soft and no tenderness, no hernia Skin: no redness, no Rash, no induration Extremities: no Pedal edema, no calf tenderness Neurologic: Grossly no focal neuro deficit. Bilaterally Equal motor strength  Data Reviewed: CBC:  Recent Labs Lab 05/14/17 0629 05/15/17 0506 05/16/17 0333  WBC 24.0* 17.3* 15.5*  NEUTROABS 22.6* 16.2* 14.0*  HGB 14.5 13.2 12.2*  HCT 43.8 40.1 37.3*  MCV 85.4 84.2 83.4  PLT 168 163 361   Basic Metabolic Panel:  Recent Labs Lab 05/14/17 0629 05/15/17 0506 05/16/17 0333  NA 139 136 135  K 4.4 4.0 4.0  CL 106 106 105  CO2 21* 23 22  GLUCOSE 120* 131* 131*  BUN 32* 29* 39*  CREATININE 1.17 1.19 1.23  CALCIUM 8.6* 8.4* 8.2*  MG  --  1.9  --     Liver Function Tests:  Recent Labs Lab 05/14/17 0629 05/15/17 0506 05/16/17 0333  AST 86* 44* 24  ALT 123* 81* 58  ALKPHOS 306* 244* 207*  BILITOT 6.1* 3.1* 2.1*  PROT 5.4* 5.0* 5.0*  ALBUMIN 2.8* 2.3* 2.2*   No results for input(s): LIPASE, AMYLASE in the  last 168 hours. No results for input(s): AMMONIA in the last 168 hours. Coagulation Profile:  Recent Labs Lab 05/13/17 1942 05/15/17 0506  INR 1.50 1.25   Cardiac Enzymes: No results for input(s): CKTOTAL, CKMB, CKMBINDEX, TROPONINI in the last 168 hours. BNP (last 3 results) No results for input(s): PROBNP in the last 8760 hours. CBG:  Recent Labs Lab 05/15/17 1624 05/15/17 2144 05/16/17 0724 05/16/17 1127 05/16/17 1615  GLUCAP 130* 156* 115* 143* 121*   Studies: Mr 3d Recon At Scanner  Result Date: 05/16/2017 CLINICAL DATA:  Inpatient. Jaundice. Dilated common bile duct and echogenic liver lesion on recent sonogram. Prior cholecystectomy. EXAM: MRI ABDOMEN WITHOUT AND WITH CONTRAST (INCLUDING MRCP) TECHNIQUE: Multiplanar multisequence MR imaging of the abdomen was performed both before and after the administration of intravenous contrast. Heavily T2-weighted images of the biliary and pancreatic ducts were obtained, and three-dimensional MRCP images were rendered by post processing. CONTRAST:  43mL MULTIHANCE GADOBENATE DIMEGLUMINE  529 MG/ML IV SOLN COMPARISON:  05/13/2017 abdominal sonogram. FINDINGS: Motion degraded scan. Lower chest: Patchy consolidation at the left lung base (series 5/image 5). Cardiomegaly. Hepatobiliary: Normal liver size and configuration. No hepatic steatosis. No liver mass. Cholecystectomy. Mild to moderate diffuse intrahepatic biliary ductal dilatation. Dilated common bile duct (17 mm diameter). At least 4 stones are seen in the lower third of the common bile duct, including a 9 mm stone lodged in the ampulla. Small posterior periampullary duodenal diverticulum. No evidence of enhancing biliary or ampullary mass. Pancreas: No gross pancreatic mass or duct dilation. Pancreatic ductal system is poorly evaluated on these motion degraded images. Cannot accurately assess for pancreas divisum. Spleen: Normal size. No mass. Adrenals/Urinary Tract: No discrete adrenal  nodules. No hydronephrosis. Small simple parapelvic renal cysts in the left greater than right kidneys. Subcentimeter simple renal cortical cysts in both kidneys. No suspicious renal masses. Stomach/Bowel: Small hiatal hernia. Otherwise collapsed and grossly normal stomach. Visualized small and large bowel is normal caliber, with no bowel wall thickening. Vascular/Lymphatic: Atherosclerotic abdominal aorta with 3.9 cm infrarenal abdominal aortic aneurysm. Patent portal, splenic, hepatic and renal veins. Contrast reflux into the IVC and hepatic veins on the arterial phase sequence. No pathologically enlarged lymph nodes in the abdomen. Other: No abdominal ascites or focal fluid collection. Musculoskeletal: No aggressive appearing focal osseous lesions. IMPRESSION: 1. Cholecystectomy. Choledocholithiasis in the lower third of the common bile duct, including a 9 mm stone lodged in the ampulla. Dilated common bile duct (17 mm diameter). Mild-to-moderate diffuse intrahepatic biliary ductal dilatation. Small periampullary duodenal diverticulum. 2. Patchy left lung base consolidation, poorly evaluated by MRI. Pneumonia not excluded. Recommend either short-term follow-up chest radiographs or dedicated chest CT. 3. Aortic atherosclerosis. Infrarenal 3.9 cm abdominal aortic aneurysm. Recommend followup by ultrasound in 2 years. This recommendation follows ACR consensus guidelines: White Paper of the ACR Incidental Findings Committee II on Vascular Findings. J Am Coll Radiol 2013; 10:789-794. 4. Cardiomegaly. Contrast reflux into the IVC and hepatic veins, suggesting elevated central venous pressures/right heart failure. 5. Small hiatal hernia. Electronically Signed   By: Ilona Sorrel M.D.   On: 05/16/2017 10:25   Mr Abdomen Mrcp Moise Boring Contast  Result Date: 05/16/2017 CLINICAL DATA:  Inpatient. Jaundice. Dilated common bile duct and echogenic liver lesion on recent sonogram. Prior cholecystectomy. EXAM: MRI ABDOMEN WITHOUT  AND WITH CONTRAST (INCLUDING MRCP) TECHNIQUE: Multiplanar multisequence MR imaging of the abdomen was performed both before and after the administration of intravenous contrast. Heavily T2-weighted images of the biliary and pancreatic ducts were obtained, and three-dimensional MRCP images were rendered by post processing. CONTRAST:  72mL MULTIHANCE GADOBENATE DIMEGLUMINE 529 MG/ML IV SOLN COMPARISON:  05/13/2017 abdominal sonogram. FINDINGS: Motion degraded scan. Lower chest: Patchy consolidation at the left lung base (series 5/image 5). Cardiomegaly. Hepatobiliary: Normal liver size and configuration. No hepatic steatosis. No liver mass. Cholecystectomy. Mild to moderate diffuse intrahepatic biliary ductal dilatation. Dilated common bile duct (17 mm diameter). At least 4 stones are seen in the lower third of the common bile duct, including a 9 mm stone lodged in the ampulla. Small posterior periampullary duodenal diverticulum. No evidence of enhancing biliary or ampullary mass. Pancreas: No gross pancreatic mass or duct dilation. Pancreatic ductal system is poorly evaluated on these motion degraded images. Cannot accurately assess for pancreas divisum. Spleen: Normal size. No mass. Adrenals/Urinary Tract: No discrete adrenal nodules. No hydronephrosis. Small simple parapelvic renal cysts in the left greater than right kidneys. Subcentimeter simple renal cortical cysts in  both kidneys. No suspicious renal masses. Stomach/Bowel: Small hiatal hernia. Otherwise collapsed and grossly normal stomach. Visualized small and large bowel is normal caliber, with no bowel wall thickening. Vascular/Lymphatic: Atherosclerotic abdominal aorta with 3.9 cm infrarenal abdominal aortic aneurysm. Patent portal, splenic, hepatic and renal veins. Contrast reflux into the IVC and hepatic veins on the arterial phase sequence. No pathologically enlarged lymph nodes in the abdomen. Other: No abdominal ascites or focal fluid collection.  Musculoskeletal: No aggressive appearing focal osseous lesions. IMPRESSION: 1. Cholecystectomy. Choledocholithiasis in the lower third of the common bile duct, including a 9 mm stone lodged in the ampulla. Dilated common bile duct (17 mm diameter). Mild-to-moderate diffuse intrahepatic biliary ductal dilatation. Small periampullary duodenal diverticulum. 2. Patchy left lung base consolidation, poorly evaluated by MRI. Pneumonia not excluded. Recommend either short-term follow-up chest radiographs or dedicated chest CT. 3. Aortic atherosclerosis. Infrarenal 3.9 cm abdominal aortic aneurysm. Recommend followup by ultrasound in 2 years. This recommendation follows ACR consensus guidelines: White Paper of the ACR Incidental Findings Committee II on Vascular Findings. J Am Coll Radiol 2013; 10:789-794. 4. Cardiomegaly. Contrast reflux into the IVC and hepatic veins, suggesting elevated central venous pressures/right heart failure. 5. Small hiatal hernia. Electronically Signed   By: Ilona Sorrel M.D.   On: 05/16/2017 10:25    Scheduled Meds: . azithromycin  500 mg Oral Daily  . B-complex with vitamin C  1 tablet Oral Daily  . calcium carbonate  1 tablet Oral Daily  . carvedilol  3.125 mg Oral BID WC  . docusate sodium  100 mg Oral Daily  . folic acid  1 mg Oral Daily  . guaiFENesin  600 mg Oral BID  . insulin aspart  0-9 Units Subcutaneous TID WC  . loratadine  10 mg Oral Daily  . magnesium oxide  200 mg Oral Daily  . pantoprazole  40 mg Oral Q1200  . tamsulosin  0.4 mg Oral QPC supper   Continuous Infusions: . amiodarone 60 mg/hr (05/16/17 1440)  . amiodarone    . cefTRIAXone (ROCEPHIN)  IV Stopped (05/16/17 0109)   PRN Meds: acetaminophen **OR** acetaminophen, bisacodyl, HYDROcodone-acetaminophen, LORazepam, ondansetron **OR** ondansetron (ZOFRAN) IV, polyethylene glycol  Time spent: 35 minutes  Author: Berle Mull, MD [facility] Pager: [phone] 05/16/2017 5:53 PM  If 7PM-7AM,  please contact night-coverage at [email or web address], password [email or web address]

## 2017-05-16 NOTE — Progress Notes (Signed)
Attempted to give pt lopressor IV 5 mg but BP currently at 97/59. Will update cardiologist and address accordingly.

## 2017-05-16 NOTE — Consult Note (Signed)
Cardiology Consultation:   Patient ID: Bruce Green; [id number]; January 20, 1919   Admit date: 05/13/2017 Date of Consult: 05/16/2017  Primary Care Provider: Kathyrn Lass, MD Primary Cardiologist: [place] Primary Electrophysiologist:  None    History of Present Illness:   Bruce Green is a 83 y.o. male with a hx of CAD/CABG who is being seen today for the evaluation of PAF with rapid rate  at the request of Dr Posey Pronto. History of CAD/CABG 2016 with improved EF to 45% BP runs low with postural symptoms on midrin Admitted with ? Hematuria but turned out to be elevated bilirubin with abnormal LFT;s and liver lesion Just had MRERCP. Denies dyspnea, palpitations syncope or chest pain Found to be in rapid afib with rate hard to control Currently not on anticoagulation  This patients CHA2DS2-VASc Score and unadjusted Ischemic Stroke Rate (% per year) is equal to 4.8 % stroke rate/year from a score of 4  Above score calculated as 1 point each if present [CHF, HTN, DM, Vascular=MI/PAD/Aortic Plaque, Age if 65-74, or Male] Above score calculated as 2 points each if present [Age > 75, or Stroke/TIA/TE]   Past Medical History:  Diagnosis Date  . Anxiety   . Cataract   . CKD (chronic kidney disease)   . Colon polyp   . Coronary artery disease 04/21/2014     severe triple vessel  . Dyslipidemia   . GERD (gastroesophageal reflux disease)   . Hiatal hernia    6cm  . Ischemic cardiomyopathy 03/2014   EF 25-30%  . Pneumonia   . Stricture esophagus    distal  . TIA (transient ischemic attack)   . Vitamin D deficiency     Past Surgical History:  Procedure Laterality Date  . APPENDECTOMY    . CARDIAC SURGERY    . CHOLECYSTECTOMY    . CORONARY ARTERY BYPASS GRAFT N/A 04/24/2014   Procedure: CORONARY ARTERY BYPASS GRAFTING (CABG);  Surgeon: Gaye Pollack, MD;  Location: [facility];  Service: Open Heart Surgery;  Laterality: N/A;  Times 3 using left internal mammary artery and endoscopically  harvested right saphenous vein  . ESOPHAGOGASTRODUODENOSCOPY  04/09/2012   Procedure: ESOPHAGOGASTRODUODENOSCOPY (EGD);  Surgeon: Gatha Mayer, MD;  Location: [facility];  Service: Endoscopy;  Laterality: N/A;  . INTRAOPERATIVE TRANSESOPHAGEAL ECHOCARDIOGRAM N/A 04/24/2014   Procedure: INTRAOPERATIVE TRANSESOPHAGEAL ECHOCARDIOGRAM;  Surgeon: Gaye Pollack, MD;  Location: [facility];  Service: Open Heart Surgery;  Laterality: N/A;  . LEFT AND RIGHT HEART CATHETERIZATION WITH CORONARY ANGIOGRAM N/A 04/21/2014   Procedure: LEFT AND RIGHT HEART CATHETERIZATION WITH CORONARY ANGIOGRAM;  Surgeon: Burnell Blanks, MD;  Location: [facility] CATH LAB;  Service: Cardiovascular;  Laterality: N/A;  . SHOULDER SURGERY       Inpatient Medications: Scheduled Meds: . azithromycin  500 mg Oral Daily  . B-complex with vitamin C  1 tablet Oral Daily  . calcium carbonate  1 tablet Oral Daily  . docusate sodium  100 mg Oral Daily  . folic acid  1 mg Oral Daily  . guaiFENesin  600 mg Oral BID  . insulin aspart  0-9 Units Subcutaneous TID WC  . loratadine  10 mg Oral Daily  . magnesium oxide  200 mg Oral Daily  . pantoprazole  40 mg Oral Q1200  . tamsulosin  0.4 mg Oral QPC supper   Continuous Infusions: . cefTRIAXone (ROCEPHIN)  IV Stopped (05/16/17 0109)  . diltiazem (CARDIZEM) infusion 5 mg/hr (05/16/17 0035)   PRN Meds: acetaminophen **OR** acetaminophen,  bisacodyl, fentaNYL (SUBLIMAZE) injection, HYDROcodone-acetaminophen, LORazepam, metoprolol tartrate, ondansetron **OR** ondansetron (ZOFRAN) IV, polyethylene glycol  Allergies:    Allergies  Allergen Reactions  . Sulfa Antibiotics Rash    Social History:   Social History   Social History  . Marital status: Widowed    Spouse name: N/A  . Number of children: 2  . Years of education: N/A   Occupational History  . retired    Social History Main Topics  . Smoking status: Never Smoker  . Smokeless tobacco: Never Used  . Alcohol use 0.6  oz/week    1 Standard drinks or equivalent per week     Comment: rare  . Drug use: No  . Sexual activity: Not on file   Other Topics Concern  . Not on file   Social History Narrative   Retired Conservation officer, historic buildings. Widowed.          Family History:   The patient's family history includes Aneurysm in his brother; Arthritis in his sister; Breast cancer in his sister; Colon cancer in his brother; Dementia in his brother; Heart attack in his mother; Kidney disease in his father; Osteoporosis in his sister.  ROS:  Please see the history of present illness.  ROS  All other ROS reviewed and negative.     Physical Exam/Data:   Vitals:   05/16/17 0451 05/16/17 0549 05/16/17 0649 05/16/17 0831  BP: 115/78 100/72 108/85 (!) 97/59  Pulse: 96 (!) 45 70   Resp: (!) 26 (!) 24 (!) 27   Temp:    97.5 F (36.4 C)  TempSrc:    Oral  SpO2: 98% 98% 99% 97%  Weight:      Height:        Intake/Output Summary (Last 24 hours) at 05/16/17 0932 Last data filed at 05/16/17 0830  Gross per 24 hour  Intake           612.17 ml  Output                0 ml  Net           612.17 ml   Filed Weights   05/14/17 0700 05/15/17 0809 05/16/17 0444  Weight: 68.8 kg (151 lb 9.6 oz) 72.8 kg (160 lb 8 oz) 70.7 kg (155 lb 12.8 oz)   Body mass index is 22.35 kg/m.  General:  Well nourished, well developed, in no acute distress  HEENT: normal Lymph: no adenopathy Neck: no JVD Endocrine:  No thryomegaly Vascular: No carotid bruits; FA pulses 2+ bilaterally without bruits  Cardiac:  normal S1, S2; RRR; no murmur   Lungs:  clear to auscultation bilaterally, no wheezing, rhonchi or rales  Abd: soft, nontender, no hepatomegaly  Ext: no edema Musculoskeletal:  No deformities, BUE and BLE strength normal and equal Skin: warm and dry  Neuro:  CNs 2-12 intact, no focal abnormalities noted Psych:  Normal affect   EKG:  The EKG was personally reviewed and demonstrates:  Afib nonspecific ST changes  Telemetry:   Telemetry was personally reviewed and demonstrates:  Afib rates 110-155   Relevant CV Studies: Echo EF 45%   Laboratory Data:  Chemistry Recent Labs Lab 05/14/17 0629 05/15/17 0506 05/16/17 0333  NA 139 136 135  K 4.4 4.0 4.0  CL 106 106 105  CO2 21* 23 22  GLUCOSE 120* 131* 131*  BUN 32* 29* 39*  CREATININE 1.17 1.19 1.23  CALCIUM 8.6* 8.4* 8.2*  GFRNONAA 51* 50* 48*  GFRAA 59* 58*  56*  ANIONGAP 12 7 8      Recent Labs Lab 05/14/17 0629 05/15/17 0506 05/16/17 0333  PROT 5.4* 5.0* 5.0*  ALBUMIN 2.8* 2.3* 2.2*  AST 86* 44* 24  ALT 123* 81* 58  ALKPHOS 306* 244* 207*  BILITOT 6.1* 3.1* 2.1*   Hematology Recent Labs Lab 05/14/17 0629 05/15/17 0506 05/16/17 0333  WBC 24.0* 17.3* 15.5*  RBC 5.13 4.76 4.47  HGB 14.5 13.2 12.2*  HCT 43.8 40.1 37.3*  MCV 85.4 84.2 83.4  MCH 28.3 27.7 27.3  MCHC 33.1 32.9 32.7  RDW 16.6* 16.5* 16.3*  PLT 168 163 180   Cardiac EnzymesNo results for input(s): TROPONINI in the last 168 hours. No results for input(s): TROPIPOC in the last 168 hours.  BNP Recent Labs Lab 05/14/17 1133  BNP 850.4*    DDimer No results for input(s): DDIMER in the last 168 hours.  Radiology/Studies:  Dg Chest 2 View  Result Date: 05/14/2017 CLINICAL DATA:  Pain in the left shoulder and productive cough EXAM: CHEST  2 VIEW COMPARISON:  May 12, 2017 FINDINGS: The mediastinal contour is normal. Patient status post prior median sternotomy and CABG. The heart size is enlarged. Mild patchy opacity is identified in the right mid and lung base. There is no pulmonary edema. There is minimal left pleural effusion. The visualized skeletal structures are stable. IMPRESSION: Mild patchy opacity of the right mid and lung base, developing pneumonia is not excluded. Electronically Signed   By: Abelardo Diesel M.D.   On: 05/14/2017 16:52   US Abdomen Complete  Result Date: 05/13/2017 CLINICAL DATA:  83 year old male with elevated LFT and hyperbilirubinemia and gross  hematuria. EXAM: ABDOMEN ULTRASOUND COMPLETE COMPARISON:  None. FINDINGS: Gallbladder: Cholecystectomy. Common bile duct: Diameter: 15 mm. Liver: There is an 8 x 8 x 8 mm echogenic lesion in the right lobe of the liver which is not well characterized but may represent a hemangioma. Other etiologies are not excluded. MRI may provide better characterization if clinically indicated. The liver is otherwise unremarkable as visualized. IVC: No abnormality visualized. Pancreas: Poorly visualized and obscured by bowel gas. Spleen: Size and appearance within normal limits. Right Kidney: Length: 12.3 cm. Echogenicity within normal limits. No hydronephrosis visualized. Left Kidney: Length: 12.3 cm. Echogenicity within normal limits. No hydronephrosis visualized. There is a 1.7 x 1.2 x 1.3 cm interpolar cyst. Abdominal aorta: There is atherosclerotic calcification of the aorta. There is aneurysmal dilatation of the midportion of the abdominal aorta measuring up to 3.7 cm. Other findings: None. IMPRESSION: 1. Subcentimeter right liver echogenic lesion, incompletely characterized, possibly a hemangioma. MRI may provide better characterization if clinically indicated. 2. Cholecystectomy. 3. No hydronephrosis or echogenic stone. 4. Abdominal aortic aneurysm measures 3.7 cm. Recommend followup by ultrasound in 2 years. This recommendation follows ACR consensus guidelines: White Paper of the ACR Incidental Findings Committee II on Vascular Findings. J Am Coll Radiol 2013; 90:240-973. 5.  Aortic Atherosclerosis (ICD10-I70.0). Electronically Signed   By: Anner Crete M.D.   On: 05/13/2017 22:51   Dg Shoulder Left  Result Date: 05/14/2017 CLINICAL DATA:  Left shoulder pain EXAM: LEFT SHOULDER - 2+ VIEW COMPARISON:  None. FINDINGS: There is no evidence of fracture or dislocation. Degenerative joint changes of the left acromioclavicular joint is noted. Soft tissues are unremarkable. IMPRESSION: No acute fracture or dislocation.  Electronically Signed   By: Abelardo Diesel M.D.   On: 05/14/2017 16:55    Assessment and Plan:   1. PAF:  Rate control limited  by low BP add amiodarone would not start anticoagulation until LFT abnormality and liver lesion explored more. May be a candidate for low dose xarelto 2. CAD/CABG:  Stable no chest pain 3. LFT;s  W/u primary MRERCP read pending  4. Pneumonia  On ceftriaxone CXR mild patchy opacity right mid and lung base    Signed, Jenkins Rouge, MD  05/16/2017 9:32 AM

## 2017-05-16 NOTE — Progress Notes (Signed)
Echocardiogram 2D Echocardiogram has been performed.  Joelene Millin 05/16/2017, 2:08 PM

## 2017-05-17 ENCOUNTER — Inpatient Hospital Stay (HOSPITAL_COMMUNITY): Payer: Medicare Other

## 2017-05-17 DIAGNOSIS — I5021 Acute systolic (congestive) heart failure: Secondary | ICD-10-CM

## 2017-05-17 LAB — COMPREHENSIVE METABOLIC PANEL
ALT: 47 U/L (ref 17–63)
AST: 19 U/L (ref 15–41)
Albumin: 2.3 g/dL — ABNORMAL LOW (ref 3.5–5.0)
Alkaline Phosphatase: 177 U/L — ABNORMAL HIGH (ref 38–126)
Anion gap: 9 (ref 5–15)
BUN: 43 mg/dL — ABNORMAL HIGH (ref 6–20)
CHLORIDE: 104 mmol/L (ref 101–111)
CO2: 23 mmol/L (ref 22–32)
CREATININE: 1.26 mg/dL — AB (ref 0.61–1.24)
Calcium: 8.5 mg/dL — ABNORMAL LOW (ref 8.9–10.3)
GFR, EST AFRICAN AMERICAN: 54 mL/min — AB (ref >60)
GFR, EST NON AFRICAN AMERICAN: 47 mL/min — AB (ref >60)
Glucose, Bld: 154 mg/dL — ABNORMAL HIGH (ref 65–99)
Potassium: 4.3 mmol/L (ref 3.5–5.1)
Sodium: 136 mmol/L (ref 135–145)
Total Bilirubin: 1.8 mg/dL — ABNORMAL HIGH (ref 0.3–1.2)
Total Protein: 5.1 g/dL — ABNORMAL LOW (ref 6.5–8.1)

## 2017-05-17 LAB — HEPARIN LEVEL (UNFRACTIONATED): Heparin Unfractionated: 0.14 IU/mL — ABNORMAL LOW (ref 0.30–0.70)

## 2017-05-17 LAB — GLUCOSE, CAPILLARY
GLUCOSE-CAPILLARY: 113 mg/dL — AB (ref 65–99)
GLUCOSE-CAPILLARY: 152 mg/dL — AB (ref 65–99)
GLUCOSE-CAPILLARY: 113 mg/dL — AB (ref 65–99)
GLUCOSE-CAPILLARY: 151 mg/dL — AB (ref 65–99)

## 2017-05-17 MED ORDER — FUROSEMIDE 10 MG/ML IJ SOLN
40.0000 mg | Freq: Two times a day (BID) | INTRAMUSCULAR | Status: DC
  Administered 2017-05-17 – 2017-05-18 (×3): 40 mg via INTRAVENOUS
  Filled 2017-05-17 (×5): qty 4

## 2017-05-17 MED ORDER — HEPARIN (PORCINE) IN NACL 100-0.45 UNIT/ML-% IJ SOLN
1250.0000 [IU]/h | INTRAMUSCULAR | Status: DC
  Administered 2017-05-17: 1000 [IU]/h via INTRAVENOUS
  Administered 2017-05-18 – 2017-05-20 (×3): 1250 [IU]/h via INTRAVENOUS
  Filled 2017-05-17 (×4): qty 250

## 2017-05-17 NOTE — Progress Notes (Signed)
ANTICOAGULATION CONSULT NOTE - Initial Consult  Pharmacy Consult for Heparin  Indication: atrial fibrillation  Allergies  Allergen Reactions  . Sulfa Antibiotics Rash    Patient Measurements: Height: 5\' 10"  (177.8 cm) Weight: 156 lb (70.8 kg) IBW/kg (Calculated) : 73 Heparin Dosing Weight: 68 kg  Vital Signs: Temp: 97.8 F (36.6 C) (06/24 0744) Temp Source: Oral (06/24 0744) BP: 104/80 (06/24 0744) Pulse Rate: 127 (06/24 0744)  Labs:  Recent Labs  05/15/17 0506 05/16/17 0333 05/17/17 0248  HGB 13.2 12.2*  --   HCT 40.1 37.3*  --   PLT 163 180  --   LABPROT 15.7*  --   --   INR 1.25  --   --   CREATININE 1.19 1.23 1.26*    Estimated Creatinine Clearance: 35.1 mL/min (A) (by C-G formula based on SCr of 1.26 mg/dL (H)).   Medical History: Past Medical History:  Diagnosis Date  . Anxiety   . Cataract   . CKD (chronic kidney disease)   . Colon polyp   . Coronary artery disease 04/21/2014     severe triple vessel  . Dyslipidemia   . GERD (gastroesophageal reflux disease)   . Hiatal hernia    6cm  . Ischemic cardiomyopathy 03/2014   EF 25-30%  . Pneumonia   . Stricture esophagus    distal  . TIA (transient ischemic attack)   . Vitamin D deficiency    Medications:  Prescriptions Prior to Admission  Medication Sig Dispense Refill Last Dose  . aspirin 81 MG tablet Take 81 mg by mouth daily.   05/13/2017 at Unknown time  . atorvastatin (LIPITOR) 80 MG tablet Take 40 mg by mouth daily.   05/13/2017 at Unknown time  . azithromycin (ZITHROMAX) 250 MG tablet Take 250-500 mg by mouth daily. Take 2 tablets on day 1 then take 1 tablet a day thereafter.   05/13/2017 at Unknown time  . B Complex-C (B-COMPLEX WITH VITAMIN C) tablet Take 1 tablet by mouth daily.   05/12/2017 at Unknown time  . Cholecalciferol (VITAMIN D3) 5000 units TABS Take 1 tablet by mouth every other day.   05/12/2017 at Unknown time  . docusate sodium (COLACE) 100 MG capsule Take 100 mg by mouth daily.    05/13/2017 at Unknown time  . folic acid (FOLVITE) 1 MG tablet Take 400 mcg by mouth daily.    05/13/2017 at Unknown time  . Glucosamine-Chondroit-Vit C-Mn (GLUCOSAMINE 1500 COMPLEX PO) Take 1,500 mg by mouth.   05/13/2017 at Unknown time  . GUAIFENESIN 1200 PO Take 1,200 mg by mouth.   05/13/2017 at Unknown time  . isosorbide mononitrate (ISMO,MONOKET) 10 MG tablet Take 10 mg by mouth daily as needed (Anxiety).    Past Week at Unknown time  . loratadine (CLARITIN) 10 MG tablet Take 1 tablet by mouth daily.   05/13/2017 at Unknown time  . Magnesium 400 MG CAPS Take 400 mg by mouth daily.   05/13/2017 at Unknown time  . midodrine (PROAMATINE) 2.5 MG tablet Take 1 tablet (2.5 mg total) by mouth daily. 90 tablet 2 05/13/2017 at Unknown time  . Multiple Vitamins-Minerals (MULTIVITAMIN WITH MINERALS) tablet Take 1 tablet by mouth daily.   05/13/2017 at Unknown time  . pantoprazole (PROTONIX) 40 MG tablet TAKE 1 TABLET (40 MG TOTAL) BY MOUTH DAILY BEFORE BREAKFAST. 90 tablet 0 05/13/2017 at Unknown time  . Probiotic Product (PROBIOTIC PO) Take 1 tablet by mouth daily.   Past Month at Unknown time  . acetaminophen (  TYLENOL) 325 MG tablet Take 650 mg by mouth every 6 (six) hours as needed for moderate pain.   PRN  . Calcium-Magnesium 500-250 MG TABS Take 1 tablet by mouth daily.   Not Taking at Unknown time   Scheduled:  . azithromycin  500 mg Oral Daily  . B-complex with vitamin C  1 tablet Oral Daily  . calcium carbonate  1 tablet Oral Daily  . carvedilol  3.125 mg Oral BID WC  . docusate sodium  100 mg Oral Daily  . folic acid  1 mg Oral Daily  . furosemide  40 mg Intravenous BID  . guaiFENesin  600 mg Oral BID  . insulin aspart  0-9 Units Subcutaneous TID WC  . loratadine  10 mg Oral Daily  . magnesium oxide  200 mg Oral Daily  . pantoprazole  40 mg Oral Q1200  . tamsulosin  0.4 mg Oral QPC supper   Infusions:  . amiodarone 30 mg/hr (05/17/17 0731)  . cefTRIAXone (ROCEPHIN)  IV Stopped (05/17/17  8242)   PRN: acetaminophen **OR** acetaminophen, bisacodyl, HYDROcodone-acetaminophen, LORazepam, ondansetron **OR** ondansetron (ZOFRAN) IV, polyethylene glycol  Assessment: Patient is a 83 year old male admitted 6/20 with elevated LFTs, jaundice found to be in A Fib with RVR. He was not on anticoagulation PTA. CHADS VASc at least 81 (age, CHF). Pharmacy was consulted to start heparin for A Fib with plans for semi-emergent DCCV in order to stabilize patient for further GI workup.   Baseline CBC shows hemoglobin with slight trend down. Concern for hematuria on admit is now thought to be due to hyperbilirubinemia, with no overt bleeding noted. Baseline INR slightly elevated at 1.25. Serum creatinine slowing increasing, and LFTs elevated but trending down. Will not bolus due to age and organ dysfunction.  Goal of Therapy:  Heparin level 0.3-0.7 units/ml Monitor platelets by anticoagulation protocol: Yes   Plan:  Start heparin at 1000 units/hr IV Follow up heparin level in 8 hours Daily heparin level and CBC  F/U plans for DCCV and oral anticoag (liver and renal dysfunction)  Demetrius Charity, PharmD Acute Care Pharmacy Resident  Pager: [phone] 05/17/2017

## 2017-05-17 NOTE — Progress Notes (Signed)
Pharmacist Heart Failure Core Measure Documentation  Assessment: [name] has an EF documented as 25-30% on 6/23.  Rationale: Heart failure patients with left ventricular systolic dysfunction (LVSD) and an EF < 40% should be prescribed an angiotensin converting enzyme inhibitor (ACEI) or angiotensin receptor blocker (ARB) at discharge unless a contraindication is documented in the medical record.  This patient is not currently on an ACEI or ARB for HF.  This note is being placed in the record in order to provide documentation that a contraindication to the use of these agents is present for this encounter.  ACE Inhibitor or Angiotensin Receptor Blocker is contraindicated (specify all that apply)  []   ACEI allergy AND ARB allergy []   Angioedema []   Moderate or severe aortic stenosis []   Hyperkalemia [x]   Hypotension - on midodrine []   Renal artery stenosis []   Worsening renal function, preexisting renal disease or dysfunction  Demetrius Charity, PharmD Acute Care Pharmacy Resident  Pager: [phone] 05/17/2017

## 2017-05-17 NOTE — Progress Notes (Signed)
Progress Note  Patient Name: Bruce Green Date of Encounter: 05/17/2017  Primary Cardiologist: Croitoru  Subjective   Much more dyspnea this am and congestion with mucous  No chest pain afib rate continues to be elevated   Inpatient Medications    Scheduled Meds: . azithromycin  500 mg Oral Daily  . B-complex with vitamin C  1 tablet Oral Daily  . calcium carbonate  1 tablet Oral Daily  . carvedilol  3.125 mg Oral BID WC  . docusate sodium  100 mg Oral Daily  . folic acid  1 mg Oral Daily  . guaiFENesin  600 mg Oral BID  . insulin aspart  0-9 Units Subcutaneous TID WC  . loratadine  10 mg Oral Daily  . magnesium oxide  200 mg Oral Daily  . pantoprazole  40 mg Oral Q1200  . tamsulosin  0.4 mg Oral QPC supper   Continuous Infusions: . amiodarone 30 mg/hr (05/17/17 0731)  . cefTRIAXone (ROCEPHIN)  IV Stopped (05/17/17 0212)   PRN Meds: acetaminophen **OR** acetaminophen, bisacodyl, HYDROcodone-acetaminophen, LORazepam, ondansetron **OR** ondansetron (ZOFRAN) IV, polyethylene glycol   Vital Signs    Vitals:   05/16/17 2115 05/16/17 2346 05/17/17 0504 05/17/17 0744  BP: 114/75 104/84 112/89 104/80  Pulse: (!) 133 (!) 127 (!) 114 (!) 127  Resp:  (!) 33 (!) 30 (!) 34  Temp:  97.8 F (36.6 C) 97.7 F (36.5 C) 97.8 F (36.6 C)  TempSrc:    Oral  SpO2:  100% 99% 99%  Weight:   70.8 kg (156 lb)   Height:        Intake/Output Summary (Last 24 hours) at 05/17/17 0930 Last data filed at 05/17/17 0786  Gross per 24 hour  Intake           406.01 ml  Output              225 ml  Net           181.01 ml   Filed Weights   05/15/17 0809 05/16/17 0444 05/17/17 0504  Weight: 72.8 kg (160 lb 8 oz) 70.7 kg (155 lb 12.8 oz) 70.8 kg (156 lb)    Telemetry    AFib rate 120-130- Personally Reviewed  ECG    Afib rate 157  Personally Reviewed  Physical Exam  Using accessory muscles to breath GEN: No acute distress.   Neck: No JVD Cardiac: RRR, SEM murmurs, rubs, or  gallops.  Respiratory: course rhonchi and wheezes througout  GI: Soft, nontender, non-distended  MS: No edema; No deformity. Neuro:  Nonfocal  Psych: Normal affect   Labs    Chemistry Recent Labs Lab 05/15/17 0506 05/16/17 0333 05/17/17 0248  NA 136 135 136  K 4.0 4.0 4.3  CL 106 105 104  CO2 23 22 23   GLUCOSE 131* 131* 154*  BUN 29* 39* 43*  CREATININE 1.19 1.23 1.26*  CALCIUM 8.4* 8.2* 8.5*  PROT 5.0* 5.0* 5.1*  ALBUMIN 2.3* 2.2* 2.3*  AST 44* 24 19  ALT 81* 58 47  ALKPHOS 244* 207* 177*  BILITOT 3.1* 2.1* 1.8*  GFRNONAA 50* 48* 47*  GFRAA 58* 56* 54*  ANIONGAP 7 8 9      Hematology Recent Labs Lab 05/14/17 0629 05/15/17 0506 05/16/17 0333  WBC 24.0* 17.3* 15.5*  RBC 5.13 4.76 4.47  HGB 14.5 13.2 12.2*  HCT 43.8 40.1 37.3*  MCV 85.4 84.2 83.4  MCH 28.3 27.7 27.3  MCHC 33.1 32.9 32.7  RDW 16.6*  16.5* 16.3*  PLT 168 163 180    Cardiac EnzymesNo results for input(s): TROPONINI in the last 168 hours. No results for input(s): TROPIPOC in the last 168 hours.   BNP Recent Labs Lab 05/14/17 1133  BNP 850.4*     DDimer No results for input(s): DDIMER in the last 168 hours.   Radiology    Mr 3d Recon At Scanner  Result Date: 05/16/2017 CLINICAL DATA:  Inpatient. Jaundice. Dilated common bile duct and echogenic liver lesion on recent sonogram. Prior cholecystectomy. EXAM: MRI ABDOMEN WITHOUT AND WITH CONTRAST (INCLUDING MRCP) TECHNIQUE: Multiplanar multisequence MR imaging of the abdomen was performed both before and after the administration of intravenous contrast. Heavily T2-weighted images of the biliary and pancreatic ducts were obtained, and three-dimensional MRCP images were rendered by post processing. CONTRAST:  56mL MULTIHANCE GADOBENATE DIMEGLUMINE 529 MG/ML IV SOLN COMPARISON:  05/13/2017 abdominal sonogram. FINDINGS: Motion degraded scan. Lower chest: Patchy consolidation at the left lung base (series 5/image 5). Cardiomegaly. Hepatobiliary: Normal  liver size and configuration. No hepatic steatosis. No liver mass. Cholecystectomy. Mild to moderate diffuse intrahepatic biliary ductal dilatation. Dilated common bile duct (17 mm diameter). At least 4 stones are seen in the lower third of the common bile duct, including a 9 mm stone lodged in the ampulla. Small posterior periampullary duodenal diverticulum. No evidence of enhancing biliary or ampullary mass. Pancreas: No gross pancreatic mass or duct dilation. Pancreatic ductal system is poorly evaluated on these motion degraded images. Cannot accurately assess for pancreas divisum. Spleen: Normal size. No mass. Adrenals/Urinary Tract: No discrete adrenal nodules. No hydronephrosis. Small simple parapelvic renal cysts in the left greater than right kidneys. Subcentimeter simple renal cortical cysts in both kidneys. No suspicious renal masses. Stomach/Bowel: Small hiatal hernia. Otherwise collapsed and grossly normal stomach. Visualized small and large bowel is normal caliber, with no bowel wall thickening. Vascular/Lymphatic: Atherosclerotic abdominal aorta with 3.9 cm infrarenal abdominal aortic aneurysm. Patent portal, splenic, hepatic and renal veins. Contrast reflux into the IVC and hepatic veins on the arterial phase sequence. No pathologically enlarged lymph nodes in the abdomen. Other: No abdominal ascites or focal fluid collection. Musculoskeletal: No aggressive appearing focal osseous lesions. IMPRESSION: 1. Cholecystectomy. Choledocholithiasis in the lower third of the common bile duct, including a 9 mm stone lodged in the ampulla. Dilated common bile duct (17 mm diameter). Mild-to-moderate diffuse intrahepatic biliary ductal dilatation. Small periampullary duodenal diverticulum. 2. Patchy left lung base consolidation, poorly evaluated by MRI. Pneumonia not excluded. Recommend either short-term follow-up chest radiographs or dedicated chest CT. 3. Aortic atherosclerosis. Infrarenal 3.9 cm abdominal  aortic aneurysm. Recommend followup by ultrasound in 2 years. This recommendation follows ACR consensus guidelines: White Paper of the ACR Incidental Findings Committee II on Vascular Findings. J Am Coll Radiol 2013; 10:789-794. 4. Cardiomegaly. Contrast reflux into the IVC and hepatic veins, suggesting elevated central venous pressures/right heart failure. 5. Small hiatal hernia. Electronically Signed   By: Ilona Sorrel M.D.   On: 05/16/2017 10:25   Mr Abdomen Mrcp Moise Boring Contast  Result Date: 05/16/2017 CLINICAL DATA:  Inpatient. Jaundice. Dilated common bile duct and echogenic liver lesion on recent sonogram. Prior cholecystectomy. EXAM: MRI ABDOMEN WITHOUT AND WITH CONTRAST (INCLUDING MRCP) TECHNIQUE: Multiplanar multisequence MR imaging of the abdomen was performed both before and after the administration of intravenous contrast. Heavily T2-weighted images of the biliary and pancreatic ducts were obtained, and three-dimensional MRCP images were rendered by post processing. CONTRAST:  23mL MULTIHANCE GADOBENATE DIMEGLUMINE 529 MG/ML IV  SOLN COMPARISON:  05/13/2017 abdominal sonogram. FINDINGS: Motion degraded scan. Lower chest: Patchy consolidation at the left lung base (series 5/image 5). Cardiomegaly. Hepatobiliary: Normal liver size and configuration. No hepatic steatosis. No liver mass. Cholecystectomy. Mild to moderate diffuse intrahepatic biliary ductal dilatation. Dilated common bile duct (17 mm diameter). At least 4 stones are seen in the lower third of the common bile duct, including a 9 mm stone lodged in the ampulla. Small posterior periampullary duodenal diverticulum. No evidence of enhancing biliary or ampullary mass. Pancreas: No gross pancreatic mass or duct dilation. Pancreatic ductal system is poorly evaluated on these motion degraded images. Cannot accurately assess for pancreas divisum. Spleen: Normal size. No mass. Adrenals/Urinary Tract: No discrete adrenal nodules. No hydronephrosis.  Small simple parapelvic renal cysts in the left greater than right kidneys. Subcentimeter simple renal cortical cysts in both kidneys. No suspicious renal masses. Stomach/Bowel: Small hiatal hernia. Otherwise collapsed and grossly normal stomach. Visualized small and large bowel is normal caliber, with no bowel wall thickening. Vascular/Lymphatic: Atherosclerotic abdominal aorta with 3.9 cm infrarenal abdominal aortic aneurysm. Patent portal, splenic, hepatic and renal veins. Contrast reflux into the IVC and hepatic veins on the arterial phase sequence. No pathologically enlarged lymph nodes in the abdomen. Other: No abdominal ascites or focal fluid collection. Musculoskeletal: No aggressive appearing focal osseous lesions. IMPRESSION: 1. Cholecystectomy. Choledocholithiasis in the lower third of the common bile duct, including a 9 mm stone lodged in the ampulla. Dilated common bile duct (17 mm diameter). Mild-to-moderate diffuse intrahepatic biliary ductal dilatation. Small periampullary duodenal diverticulum. 2. Patchy left lung base consolidation, poorly evaluated by MRI. Pneumonia not excluded. Recommend either short-term follow-up chest radiographs or dedicated chest CT. 3. Aortic atherosclerosis. Infrarenal 3.9 cm abdominal aortic aneurysm. Recommend followup by ultrasound in 2 years. This recommendation follows ACR consensus guidelines: White Paper of the ACR Incidental Findings Committee II on Vascular Findings. J Am Coll Radiol 2013; 10:789-794. 4. Cardiomegaly. Contrast reflux into the IVC and hepatic veins, suggesting elevated central venous pressures/right heart failure. 5. Small hiatal hernia. Electronically Signed   By: Ilona Sorrel M.D.   On: 05/16/2017 10:25    Cardiac Studies   Study Conclusions  - Left ventricle: EF hard to judge due to rapid afib. Inferior and   septal akinesis The cavity size was moderately dilated. Wall   thickness was normal. Systolic function was severely reduced.  The   estimated ejection fraction was in the range of 25% to 30%. The   study is not technically sufficient to allow evaluation of LV   diastolic function. - Aortic valve: There was mild regurgitation. - Mitral valve: There was moderate regurgitation. - Left atrium: The atrium was moderately dilated. - Right atrium: The atrium was mildly dilated. - Atrial septum: No defect or patent foramen ovale was identified. - Tricuspid valve: There was moderate regurgitation. - Pulmonary arteries: PA peak pressure: 31 mm Hg (S).  Patient Profile    83 y.o. CAD/CABG ischemic DCM PAF with rapid rate. Low BP with postural symptoms on midrin prior to admission. Admitted with LFT abnormalities and retained CBD stone. Needs ERCP   Assessment & Plan    1) PAF:  Rate control is difficult day 2 amiodarone No good answer here but will start heparin as I suspect he will need semi emergent [facility] to keep him out of CHF. Will not be ideal to stop heparin after [facility] for ERCP but I don't think he will tolerate ERCP unless he is out of afib  2) CHF:  CXR 05/14/17 ? RLL patchy pneumonia BNP 850 on 6/21/  Will add bid lasix need to watch BP as he runs low to begin with 3) Pneumonia:  On zithromax and rocephin f/u CXR  Prognosis guarded discussed with daughter she and patient indicate wish to be intubated if needed    Signed, Jenkins Rouge, MD  05/17/2017, 9:30 AM

## 2017-05-17 NOTE — Progress Notes (Signed)
ANTICOAGULATION CONSULT NOTE - Initial Consult  Pharmacy Consult for Heparin  Indication: atrial fibrillation   Assessment: Patient is a 83 year old male admitted 6/20 with elevated LFTs, jaundice found to be in A Fib with RVR. No anticoagulation PTA. CHADS VASc at least 42 (age, CHF). Pharmacy consulted to start heparin for A Fib with plans for semi-emergent DCCV in order to stabilize patient for further GI workup.   Baseline CBC shows hemoglobin with slight trend down. Concern for hematuria on admit is now thought to be due to hyperbilirubinemia, with no overt bleeding noted. Baseline INR slightly elevated at 1.25. Creatinine increasing slightly. LFTs elevated but trending down. No bolus given. Initial heparin level low.  Goal of Therapy:  Heparin level 0.3-0.7 units/ml Monitor platelets by anticoagulation protocol: Yes   Plan:  Increase heparin to 1250 units/hr IV Follow up heparin level in 8 hours Daily heparin level and CBC  F/U plans for DCCV and oral anticoag (liver and renal dysfunction)   Allergies  Allergen Reactions  . Sulfa Antibiotics Rash    Patient Measurements: Height: 5\' 10"  (177.8 cm) Weight: 156 lb (70.8 kg) IBW/kg (Calculated) : 73 Heparin Dosing Weight: 68 kg  Vital Signs: Temp: 97.7 F (36.5 C) (06/24 1150) Temp Source: Oral (06/24 1150) BP: 102/80 (06/24 1651) Pulse Rate: 117 (06/24 1651)  Labs:  Recent Labs  05/15/17 0506 05/16/17 0333 05/17/17 0248 05/17/17 1705  HGB 13.2 12.2*  --   --   HCT 40.1 37.3*  --   --   PLT 163 180  --   --   LABPROT 15.7*  --   --   --   INR 1.25  --   --   --   HEPARINUNFRC  --   --   --  0.14*  CREATININE 1.19 1.23 1.26*  --     Estimated Creatinine Clearance: 35.1 mL/min (A) (by C-G formula based on SCr of 1.26 mg/dL (H)).  Medical History: Past Medical History:  Diagnosis Date  . Anxiety   . Cataract   . CKD (chronic kidney disease)   . Colon polyp   . Coronary artery disease 04/21/2014    severe triple vessel  . Dyslipidemia   . GERD (gastroesophageal reflux disease)   . Hiatal hernia    6cm  . Ischemic cardiomyopathy 03/2014   EF 25-30%  . Pneumonia   . Stricture esophagus    distal  . TIA (transient ischemic attack)   . Vitamin D deficiency    Dierdre Harness, BS, PharmD Clinical Pharmacy Resident [phone] (Pager) 05/17/2017 5:50 PM

## 2017-05-17 NOTE — Progress Notes (Signed)
[facility] Progress Note  Patient: Bruce Green:[id number]   PCP: Kathyrn Lass, MD DOB: 1919/07/16   DOA: 05/13/2017   DOS: 05/17/2017   Date of Service: the patient was seen and examined on 05/17/2017  Subjective: Appear short of breath. No nausea no vomiting. Oral intake is limited. No diarrhea no constipation.  Brief hospital course: Pt. with PMH of hypertension, chronic kidney disease, coronary artery disease, GERD, recent pneumonia; admitted on 05/13/2017, presented with complaint of abnormal lab, was found to have abnormal LFT as well as ongoing pneumonia. MRCP is suggesting CBD stone, GI consulted for ERCP but will require cardiac clearance and stabilization. Currently further plan is to stabilize and consider ERCP for further workup.  Assessment and Plan: 1. CBD stone with obstruction. Transaminitis AST ALT elevated as well as bilirubin. Currently LFT are trending downward Ultrasound abdomen shows presence of a liver lesion and MRI negative for any liver mass or pancreatic mass. GI currently following, highly appreciate their input. Currently the recommend to prioritize stabilization of cardiorespiratory status. If the patient requires anticoagulation and cardioversion they would defer the ERCP until the patient is able to come off of the anticoagulation. Since the patient is asymptomatic and LFTs are trending down this approach can be taken. Should the patient develop signs of cholangitis or recurrent obstruction, at that point if the patient is on anticoagulation, ERCP with stent placement without sphincterotomy can be performed while on anticoagulation as a temporizing measure until the patient can stop anticoagulation.  Hepatitis panel negative.  2. Community acquired pneumonia. Continue ceftriaxone. Severe leukocytosis, blood cultures no growth to now. X-ray shows mild evidence of pneumonia on the right side.  3. Left shoulder pain. X-ray shows no evidence of  fracture likely muscular sprain. Physical therapy consulted. Continue pain control.  4. Renal cyst. MRI suggest small simple parapelvic renal cyst in the left kidneys as well as right kidney. No further workup. No suspicious mass.  5. C/o Gross hematuria. On urinalysis here in the hospital no evidence of hematuria, hemoglobin undetectable. Likely hyperbilirubinemia causing bilirubinemia looking like hematuria. No further workup at present.  6. Coronary artery disease. Resume aspirin Hold statin. Monitor.  7. Orthostatic hypotension. Stop midodrine due to A. fib with RVR  8. A. fib with RVR.  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  Patient was given IV Lopressor as well as by mouth Lopressor every 6 hours which did not work for rate control. Cardizem drip was also started that the patient was not a to tolerate due to hypotension and also not indicated due to low EF. Cardiology was consulted, transitioning to amiodarone for rate control. Currently holding anticoagulation until GI workup is completed. 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 Starting on low-dose Coreg. Starting on Lasix. On amiodarone. Also started on heparin.  Diet: cardiac diet DVT Prophylaxis: subcutaneous Heparin  Advance goals of care discussion: full code  Family Communication: family was present at bedside, at the time of interview. The pt provided permission to discuss medical plan with the family. Opportunity was given to ask question and all questions were answered satisfactorily.   Disposition:  Discharge to home.  Consultants: gastroenterology, cardiology Procedures: Echocardiogram  Antibiotics: Anti-infectives    Start     Dose/Rate Route Frequency Ordered Stop   05/15/17 2100  azithromycin (ZITHROMAX) tablet 500 mg     500 mg Oral Daily 05/15/17 1958     05/14/17 1000  azithromycin  (ZITHROMAX) tablet 250 mg    Comments:  Take 2 tablets on day 1 then take 1 tablet a day thereafter.     250 mg Oral Daily 05/13/17 2012 05/15/17 0957   05/14/17 0130  cefTRIAXone (ROCEPHIN) 1 g in dextrose 5 % 50 mL IVPB     1 g 100 mL/hr over 30 Minutes Intravenous Every 24 hours 05/14/17 0121         Objective: Physical Exam: Vitals:   05/17/17 0504 05/17/17 0744 05/17/17 1150 05/17/17 1651  BP: 112/89 104/80 109/86 102/80  Pulse: (!) 114 (!) 127 (!) 117 (!) 117  Resp: (!) 30 (!) 34 (!) 39 (!) 23  Temp: 97.7 F (36.5 C) 97.8 F (36.6 C) 97.7 F (36.5 C)   TempSrc:  Oral Oral   SpO2: 99% 99% 99% 98%  Weight: 70.8 kg (156 lb)     Height:        Intake/Output Summary (Last 24 hours) at 05/17/17 1820 Last data filed at 05/17/17 2536  Gross per 24 hour  Intake           406.01 ml  Output              125 ml  Net           281.01 ml   Filed Weights   05/15/17 0809 05/16/17 0444 05/17/17 0504  Weight: 72.8 kg (160 lb 8 oz) 70.7 kg (155 lb 12.8 oz) 70.8 kg (156 lb)   General: Alert, Awake and Oriented to Time, Place and Person. Appear in mild distress, affect appropriate Eyes: PERRL, Conjunctiva normal ENT: Oral Mucosa clear moist. Neck: no JVD, no Abnormal Mass Or lumps Cardiovascular: S1 and S2 Present, no Murmur, Peripheral Pulses Present Respiratory: normal respiratory effort, Bilateral Air entry equal and Decreased, no use of accessory muscle, bilateral Crackles, no wheezes Abdomen: Bowel Sound present, Soft and no tenderness, no hernia Skin: no redness, no Rash, no induration Extremities: no Pedal edema, no calf tenderness Neurologic: Grossly no focal neuro deficit. Bilaterally Equal motor strength  Data Reviewed: CBC:  Recent Labs Lab 05/14/17 0629 05/15/17 0506 05/16/17 0333  WBC 24.0* 17.3* 15.5*  NEUTROABS 22.6* 16.2* 14.0*  HGB 14.5 13.2 12.2*  HCT 43.8 40.1 37.3*  MCV 85.4 84.2 83.4  PLT 168 163 644   Basic Metabolic Panel:  Recent Labs Lab  05/14/17 0629 05/15/17 0506 05/16/17 0333 05/17/17 0248  NA 139 136 135 136  K 4.4 4.0 4.0 4.3  CL 106 106 105 104  CO2 21* 23 22 23   GLUCOSE 120* 131* 131* 154*  BUN 32* 29* 39* 43*  CREATININE 1.17 1.19 1.23 1.26*  CALCIUM 8.6* 8.4* 8.2* 8.5*  MG  --  1.9  --   --     Liver Function Tests:  Recent Labs Lab 05/14/17 0629 05/15/17 0506 05/16/17 0333 05/17/17 0248  AST 86* 44* 24 19  ALT 123* 81* 58 47  ALKPHOS 306* 244* 207* 177*  BILITOT 6.1* 3.1* 2.1* 1.8*  PROT 5.4* 5.0* 5.0* 5.1*  ALBUMIN 2.8* 2.3* 2.2* 2.3*   No results for input(s): LIPASE, AMYLASE in the last 168 hours. No results for input(s): AMMONIA in the last 168 hours. Coagulation Profile:  Recent Labs Lab 05/13/17 1942 05/15/17 0506  INR 1.50 1.25   Cardiac Enzymes: No results for input(s): CKTOTAL, CKMB, CKMBINDEX, TROPONINI in the last 168 hours. BNP (last 3 results) No results for input(s): PROBNP in the last 8760 hours. CBG:  Recent Labs Lab 05/16/17 1615 05/16/17 2056 05/17/17 0742 05/17/17 1145 05/17/17 1651  GLUCAP 121* 171* 113* 152* 113*   Studies: Dg Chest [place] 1v Same Day  Result Date: 05/17/2017 CLINICAL DATA:  CHF. EXAM: PORTABLE CHEST 1 VIEW COMPARISON:  May 14, 2017 FINDINGS: No pneumothorax. Persistent patchy opacity in the right mid and lower lung and in the left base. Cardiomegaly. No other interval changes. IMPRESSION: Persistent patchy opacities in the right mid and lower lung in addition to the left base. Electronically Signed   By: Dorise Bullion III M.D   On: 05/17/2017 14:09    Scheduled Meds: . azithromycin  500 mg Oral Daily  . B-complex with vitamin C  1 tablet Oral Daily  . calcium carbonate  1 tablet Oral Daily  . carvedilol  3.125 mg Oral BID WC  . docusate sodium  100 mg Oral Daily  . folic acid  1 mg Oral Daily  . furosemide  40 mg Intravenous BID  . guaiFENesin  600 mg Oral BID  . insulin aspart  0-9 Units Subcutaneous TID WC  . loratadine  10 mg  Oral Daily  . magnesium oxide  200 mg Oral Daily  . pantoprazole  40 mg Oral Q1200  . tamsulosin  0.4 mg Oral QPC supper   Continuous Infusions: . amiodarone 30 mg/hr (05/17/17 0731)  . cefTRIAXone (ROCEPHIN)  IV Stopped (05/17/17 6004)  . heparin 1,000 Units/hr (05/17/17 1125)   PRN Meds: acetaminophen **OR** acetaminophen, bisacodyl, HYDROcodone-acetaminophen, LORazepam, ondansetron **OR** ondansetron (ZOFRAN) IV, polyethylene glycol  Time spent: 35 minutes  Author: Berle Mull, MD [facility] Pager: [phone] 05/17/2017 6:20 PM  If 7PM-7AM, please contact night-coverage at [email or web address], password [facility]

## 2017-05-17 NOTE — Progress Notes (Signed)
Subjective: No abdominal pain. Significant increase in shortness of breath.  Objective: Vital signs in last 24 hours: Temp:  [97.4 F (36.3 C)-98.6 F (37 C)] 97.8 F (36.6 C) (06/24 0744) Pulse Rate:  [54-138] 127 (06/24 0744) Resp:  [25-34] 34 (06/24 0744) BP: (87-116)/(65-89) 104/80 (06/24 0744) SpO2:  [91 %-100 %] 99 % (06/24 0744) Weight:  [70.8 kg (156 lb)] 70.8 kg (156 lb) (06/24 0504) Weight change: -2.041 kg (-4 lb 8 oz) Last BM Date: 05/14/17  PE: GEN:  Tachypnea significantly worsened cf. Yesterday LUNGS:  Coarse airway sounds ABD:  Soft, non-tender  Lab Results: CBC    Component Value Date/Time   WBC 15.5 (H) 05/16/2017 0333   RBC 4.47 05/16/2017 0333   HGB 12.2 (L) 05/16/2017 0333   HCT 37.3 (L) 05/16/2017 0333   PLT 180 05/16/2017 0333   MCV 83.4 05/16/2017 0333   MCH 27.3 05/16/2017 0333   MCHC 32.7 05/16/2017 0333   RDW 16.3 (H) 05/16/2017 0333   LYMPHSABS 0.7 05/16/2017 0333   MONOABS 0.8 05/16/2017 0333   EOSABS 0.0 05/16/2017 0333   BASOSABS 0.0 05/16/2017 0333   CMP     Component Value Date/Time   NA 136 05/17/2017 0248   K 4.3 05/17/2017 0248   CL 104 05/17/2017 0248   CO2 23 05/17/2017 0248   GLUCOSE 154 (H) 05/17/2017 0248   BUN 43 (H) 05/17/2017 0248   CREATININE 1.26 (H) 05/17/2017 0248   CREATININE 1.28 (H) 10/09/2016 0920   CALCIUM 8.5 (L) 05/17/2017 0248   PROT 5.1 (L) 05/17/2017 0248   ALBUMIN 2.3 (L) 05/17/2017 0248   AST 19 05/17/2017 0248   ALT 47 05/17/2017 0248   ALKPHOS 177 (H) 05/17/2017 0248   BILITOT 1.8 (H) 05/17/2017 0248   GFRNONAA 47 (L) 05/17/2017 0248   GFRNONAA 69 05/17/2014 1015   GFRAA 54 (L) 05/17/2017 0248   GFRAA 80 05/17/2014 1015   Assessment:  1.  Dilated bile duct with choledocholithiasis.  Fevers on presentation could be cholangitis, but patient had no abdominal pain; fevers could also have been from pneumonia. 2.  Elevated LFTs, downtrending. 3.  Shortness of breath, worsening; rapid a. Fib and  pneumonia contributing.  Plan:  1.  Patient's LFTs are improving and he has no abdominal pain.  Do not feel he has cholangitis.  Continue antibiotics. 2.  Most pressing matter is patient's cardiorespiratory distress; would not do ERCP until this has resolved; I talked with Dr. Posey Pronto, and patient may end up being put on anticoagulation with hope for cardioversion down-the-road.  If this is the case, would defer therapeutic ERCP with sphincterotomy and stone extraction until patient can go off anticoagulation.  If patient develops cholangitis while on anticoagulation, we could do ERCP with stent placement alone as bridge to later therapeutic intervention once off anticoagulation. 3.  [facility] will follow.   THADDUS, MCDOWELL 05/17/2017, 10:35 AM   Pager [phone] If no answer or after 5 PM call [phone]

## 2017-05-18 DIAGNOSIS — R310 Gross hematuria: Secondary | ICD-10-CM

## 2017-05-18 LAB — COMPREHENSIVE METABOLIC PANEL
ALT: 40 U/L (ref 17–63)
ANION GAP: 10 (ref 5–15)
AST: 27 U/L (ref 15–41)
Albumin: 2.3 g/dL — ABNORMAL LOW (ref 3.5–5.0)
Alkaline Phosphatase: 157 U/L — ABNORMAL HIGH (ref 38–126)
BUN: 43 mg/dL — ABNORMAL HIGH (ref 6–20)
CALCIUM: 8.1 mg/dL — AB (ref 8.9–10.3)
CHLORIDE: 100 mmol/L — AB (ref 101–111)
CO2: 24 mmol/L (ref 22–32)
CREATININE: 1.36 mg/dL — AB (ref 0.61–1.24)
GFR, EST AFRICAN AMERICAN: 49 mL/min — AB (ref >60)
GFR, EST NON AFRICAN AMERICAN: 43 mL/min — AB (ref >60)
Glucose, Bld: 132 mg/dL — ABNORMAL HIGH (ref 65–99)
Potassium: 3.3 mmol/L — ABNORMAL LOW (ref 3.5–5.1)
SODIUM: 134 mmol/L — AB (ref 135–145)
Total Bilirubin: 1.4 mg/dL — ABNORMAL HIGH (ref 0.3–1.2)
Total Protein: 4.8 g/dL — ABNORMAL LOW (ref 6.5–8.1)

## 2017-05-18 LAB — CBC
HCT: 36.9 % — ABNORMAL LOW (ref 39.0–52.0)
HEMOGLOBIN: 12.3 g/dL — AB (ref 13.0–17.0)
MCH: 27.5 pg (ref 26.0–34.0)
MCHC: 33.3 g/dL (ref 30.0–36.0)
MCV: 82.6 fL (ref 78.0–100.0)
PLATELETS: 223 10*3/uL (ref 150–400)
RBC: 4.47 MIL/uL (ref 4.22–5.81)
RDW: 16.2 % — ABNORMAL HIGH (ref 11.5–15.5)
WBC: 11.6 10*3/uL — ABNORMAL HIGH (ref 4.0–10.5)

## 2017-05-18 LAB — GLUCOSE, CAPILLARY
GLUCOSE-CAPILLARY: 129 mg/dL — AB (ref 65–99)
Glucose-Capillary: 110 mg/dL — ABNORMAL HIGH (ref 65–99)
Glucose-Capillary: 118 mg/dL — ABNORMAL HIGH (ref 65–99)
Glucose-Capillary: 138 mg/dL — ABNORMAL HIGH (ref 65–99)

## 2017-05-18 LAB — HEPARIN LEVEL (UNFRACTIONATED): HEPARIN UNFRACTIONATED: 0.43 [IU]/mL (ref 0.30–0.70)

## 2017-05-18 NOTE — Progress Notes (Signed)
[place] for Heparin  Indication: atrial fibrillation  Allergies  Allergen Reactions  . Sulfa Antibiotics Rash    Patient Measurements: Height: 5\' 10"  (177.8 cm) Weight: 156 lb (70.8 kg) IBW/kg (Calculated) : 73 Heparin Dosing Weight: 68 kg  Vital Signs: Temp: 97.5 F (36.4 C) (06/25 0011) Temp Source: Axillary (06/24 2004) BP: 113/70 (06/25 0011) Pulse Rate: 124 (06/25 0011)  Labs:  Recent Labs  05/15/17 0506 05/16/17 0333 05/17/17 0248 05/17/17 1705 05/18/17 0208  HGB 13.2 12.2*  --   --  12.3*  HCT 40.1 37.3*  --   --  36.9*  PLT 163 180  --   --  223  LABPROT 15.7*  --   --   --   --   INR 1.25  --   --   --   --   HEPARINUNFRC  --   --   --  0.14* 0.43  CREATININE 1.19 1.23 1.26*  --   --     Estimated Creatinine Clearance: 35.1 mL/min (A) (by C-G formula based on SCr of 1.26 mg/dL (H)).   Assessment: 83 y.o. male with Afib for heparin.  Goal of Therapy:  Heparin level 0.3-0.7 units/ml Monitor platelets by anticoagulation protocol: Yes   Plan:  Continue Heparin at current rate   Phillis Knack, PharmD, BCPS  05/18/2017

## 2017-05-18 NOTE — Progress Notes (Signed)
Progress Note  Patient Name: Bruce Green   Date of Encounter: 05/18/2017  Primary Cardiologist: Dr. Sallyanne Kuster   Subjective   He can feel his heart racing but no dizziness. Main issue is frequent coughing fits. No CP.   Inpatient Medications    Scheduled Meds: . azithromycin  500 mg Oral Daily  . B-complex with vitamin C  1 tablet Oral Daily  . calcium carbonate  1 tablet Oral Daily  . carvedilol  3.125 mg Oral BID WC  . docusate sodium  100 mg Oral Daily  . folic acid  1 mg Oral Daily  . furosemide  40 mg Intravenous BID  . guaiFENesin  600 mg Oral BID  . insulin aspart  0-9 Units Subcutaneous TID WC  . loratadine  10 mg Oral Daily  . magnesium oxide  200 mg Oral Daily  . pantoprazole  40 mg Oral Q1200  . tamsulosin  0.4 mg Oral QPC supper   Continuous Infusions: . amiodarone 30 mg/hr (05/17/17 2123)  . cefTRIAXone (ROCEPHIN)  IV Stopped (05/18/17 0141)  . heparin 1,250 Units/hr (05/17/17 1800)   PRN Meds: acetaminophen **OR** acetaminophen, bisacodyl, HYDROcodone-acetaminophen, LORazepam, ondansetron **OR** ondansetron (ZOFRAN) IV, polyethylene glycol   Vital Signs    Vitals:   05/17/17 2004 05/18/17 0011 05/18/17 0612 05/18/17 0744  BP: 96/76 113/70 114/79 109/73  Pulse: (!) 119 (!) 124 (!) 131 (!) 112  Resp: (!) 29 (!) 26 (!) 29 (!) 24  Temp: 97.3 F (36.3 C) 97.5 F (36.4 C) 97.4 F (36.3 C) 97.6 F (36.4 C)  TempSrc: Axillary Axillary Axillary Oral  SpO2: 98% 97% 98% 95%  Weight:   151 lb 14.4 oz (68.9 kg)   Height:        Intake/Output Summary (Last 24 hours) at 05/18/17 0819 Last data filed at 05/18/17 0120  Gross per 24 hour  Intake           255.65 ml  Output              825 ml  Net          -569.35 ml   Filed Weights   05/16/17 0444 05/17/17 0504 05/18/17 0612  Weight: 155 lb 12.8 oz (70.7 kg) 156 lb (70.8 kg) 151 lb 14.4 oz (68.9 kg)    Telemetry    Atrial fibrillation w/ RVR  - Personally Reviewed  ECG    Atrial fibrillation  w/ RVR - Personally Reviewed  Physical Exam   GEN: No acute distress.   Neck: No JVD Cardiac: irregularlly irregular, tachy rate, no murmurs, rubs, or gallops.  Respiratory: decreased BS at the bases, diffuse rhonchi bilaterally GI: Soft, nontender, non-distended  MS: No edema; No deformity. Neuro:  Nonfocal  Psych: Normal affect   Labs    Chemistry Recent Labs Lab 05/16/17 0333 05/17/17 0248 05/18/17 0208  NA 135 136 134*  K 4.0 4.3 3.3*  CL 105 104 100*  CO2 22 23 24   GLUCOSE 131* 154* 132*  BUN 39* 43* 43*  CREATININE 1.23 1.26* 1.36*  CALCIUM 8.2* 8.5* 8.1*  PROT 5.0* 5.1* 4.8*  ALBUMIN 2.2* 2.3* 2.3*  AST 24 19 27   ALT 58 47 40  ALKPHOS 207* 177* 157*  BILITOT 2.1* 1.8* 1.4*  GFRNONAA 48* 47* 43*  GFRAA 56* 54* 49*  ANIONGAP 8 9 10      Hematology Recent Labs Lab 05/15/17 0506 05/16/17 0333 05/18/17 0208  WBC 17.3* 15.5* 11.6*  RBC 4.76 4.47 4.47  HGB 13.2 12.2* 12.3*  HCT 40.1 37.3* 36.9*  MCV 84.2 83.4 82.6  MCH 27.7 27.3 27.5  MCHC 32.9 32.7 33.3  RDW 16.5* 16.3* 16.2*  PLT 163 180 223    Cardiac EnzymesNo results for input(s): TROPONINI in the last 168 hours. No results for input(s): TROPIPOC in the last 168 hours.   BNP Recent Labs Lab 05/14/17 1133  BNP 850.4*     DDimer No results for input(s): DDIMER in the last 168 hours.   Radiology    Dg Chest [place] 1v Same Day  Result Date: 05/17/2017 CLINICAL DATA:  CHF. EXAM: PORTABLE CHEST 1 VIEW COMPARISON:  May 14, 2017 FINDINGS: No pneumothorax. Persistent patchy opacity in the right mid and lower lung and in the left base. Cardiomegaly. No other interval changes. IMPRESSION: Persistent patchy opacities in the right mid and lower lung in addition to the left base. Electronically Signed   By: Dorise Bullion III M.D   On: 05/17/2017 14:09    Cardiac Studies   2D Echo 05/16/17 Study Conclusions  - Left ventricle: EF hard to judge due to rapid afib. Inferior and   septal akinesis The  cavity size was moderately dilated. Wall   thickness was normal. Systolic function was severely reduced. The   estimated ejection fraction was in the range of 25% to 30%. The   study is not technically sufficient to allow evaluation of LV   diastolic function. - Aortic valve: There was mild regurgitation. - Mitral valve: There was moderate regurgitation. - Left atrium: The atrium was moderately dilated. - Right atrium: The atrium was mildly dilated. - Atrial septum: No defect or patent foramen ovale was identified. - Tricuspid valve: There was moderate regurgitation. - Pulmonary arteries: PA peak pressure: 31 mm Hg (S).   Patient Profile     83 y.o. 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 Admitted 05/13/17 with LFT abnormalities and retained CBD stone (GI following) as well as ongoing PNA. Cardiology consulted for atrial fibrillation w/ RVR.   Assessment & Plan    1. Common Bile Duct Stone w/ Obstruction: improving with antibiotics. LFTs downtrending. Pt's symptoms improved. GI has been following. GI recommends stabilization of patient's cardiorespiratory status prior to ERCP. Per notes from GI and IM, "If the patient requires anticoagulation and cardioversion they would defer the ERCP until the patient is able to come off of the anticoagulation.Since the patient is asymptomatic and LFTs are trending down this approach can be taken.Should the patient develop signs of cholangitis or recurrent obstruction, at that point if the patient is on anticoagulation, ERCP with stent placement without sphincterotomy can be performed while on anticoagulation as a temporizing measure until the patient can stop anticoagulation".  2. CAP: on antibiotics. Management per IM.   3. Atrial Fibrillation w/ RVR: in the setting of CBD obstruction and PNA. On antibiotics with clinical improvement. Also with mild renal insufficiency and hypokalemia. Replete  K and continue treatment of underlying conditions, which are all likely driving his arrhthymia. Continue IV amiodarone for rate control, given limited ability to tolerate high doses of BB, given h/o orthostatic hypotension. His BP is tolerating low dose Coreg. We are avoiding CCB given h/o hypotension and chronic systolic HF with EF of 16-10%. He continues to have rapid ventricular response, despite IV amiodarone and BB. He will likely need TEE DCCV. He is not currently NPO and has eaten breakfast. Will discuss with MD  possible DCCV later this afternoon. Will make NPO after breakfast. Continue IV heparin for a/c. This patients CHA2DS2-VASc Score and unadjusted Ischemic Stroke Rate (% per year) is equal to 4.8 % stroke rate/year from a score of 4.   Signed, Lyda Jester, PA-C  05/18/2017, 8:19 AM    History and all data above reviewed.  Patient examined.  I agree with the findings as above.  He denies any pain or SOB this morning. The patient exam reveals [id number]  ,  Lungs: Decreased breath sounds without wheezing or crackles  ,  Abd: Positive bowel sounds, no rebound no guarding, Ext No edema  .  All available labs, radiology testing, previous records reviewed. Agree with documented assessment and plan. Atrial fib:  Will plan TEE/DCCV in the AM.  Continue amiodarone and heparin for now.   Chronic systolic HF:  He seems to be euvolemic.  Problems with hypotension preclude aggressive med titration.      Jeneen Rinks Kalem Rockwell  9:08 AM  05/18/2017

## 2017-05-18 NOTE — Progress Notes (Signed)
[facility] Gastroenterology Progress Note  Bruce Green 83 y.o. [birth year]  CC:  Choledocholithiasis   Subjective: Patient is alert but somewhat confused. He remains in A. fib with heart rate up to 150. Daughter at bedside. Patient denied any nausea or vomiting. He denies abdominal pain today. Afebrile this morning  ROS : Positive for mild shortness of breath. Negative for chest pain   Objective: Vital signs in last 24 hours: Vitals:   05/18/17 0612 05/18/17 0744  BP: 114/79 109/73  Pulse: (!) 131 (!) 112  Resp: (!) 29 (!) 24  Temp: 97.4 F (36.3 C) 97.6 F (36.4 C)    Physical Exam:  General:  Alert But appears to be somewhat confused.   Head:  Normocephalic, without obvious abnormality, atraumatic  Eyes:  , EOM's intact,   Lungs:   Bilateral crackles in the lower aspect. Not in apparent distress   Heart:  Irregularly irregular heart rate with tachycardia   Abdomen:   Soft, non-tender, bowel sounds active all four quadrants,  no masses, No peritoneal signs   Extremities: Extremities normal, atraumatic, no  edema  Pulses: 2+ and symmetric    Lab Results:  Recent Labs  05/17/17 0248 05/18/17 0208  NA 136 134*  K 4.3 3.3*  CL 104 100*  CO2 23 24  GLUCOSE 154* 132*  BUN 43* 43*  CREATININE 1.26* 1.36*  CALCIUM 8.5* 8.1*    Recent Labs  05/17/17 0248 05/18/17 0208  AST 19 27  ALT 47 40  ALKPHOS 177* 157*  BILITOT 1.8* 1.4*  PROT 5.1* 4.8*  ALBUMIN 2.3* 2.3*    Recent Labs  05/16/17 0333 05/18/17 0208  WBC 15.5* 11.6*  NEUTROABS 14.0*  --   HGB 12.2* 12.3*  HCT 37.3* 36.9*  MCV 83.4 82.6  PLT 180 223   No results for input(s): LABPROT, INR in the last 72 hours.    Assessment/Plan: - Choledocholithiasis with the multiple stones within the CBD as well as a dilated CBD up to 17 mm - Abnormal LFTs. Trending down. - A. fib with RVR. Currently on heparin drip - SOB  probably from community acquired  pneumonia.  Recommendations ----------------------- - Patient's LFTs are trending down. Leukocytosis improving. He is afebrile. - Patient is alert but somewhat confused this morning which is baseline according to daughter. - Patient appears to be in A. fib with RVR and currently on heparin drip. Recommend conservative management at this time. Recommend antibiotics for total 2 weeks. If patient has evidence of acute ascending cholangitis, consider ERCP with stent placement. If no evidence of acute ascending cholangitis, recommend elective ERCP with sphincterotomy and stone extractions once A. fib has been stabilized and patient is off anticoagulation. - Plan was discussed with daughter in detail. She is in agreement. GI will follow  Otis Brace MD, [place] 05/18/2017, 8:37 AM  Pager [phone]  If no answer or after 5 PM call [phone]

## 2017-05-18 NOTE — Care Management Important Message (Signed)
Important Message  Patient Details  Name: Bruce Green MRN: [id number] Date of Birth: 10/02/1919   Medicare Important Message Given:  Yes    [name] 05/18/2017, 11:01 AM

## 2017-05-18 NOTE — Progress Notes (Signed)
PT Cancellation Note  Patient Details Name: Bruce Green MRN: [id number] DOB: [birth year]   Cancelled Treatment:    Reason Eval/Treat Not Completed: Medical issues which prohibited therapy. Pt with resting HR of 140's.   Shary Decamp [place] 05/18/2017, 12:28 PM [employer] PT [phone]

## 2017-05-18 NOTE — Progress Notes (Signed)
[facility] Progress Note  Patient: Bruce Green:[id number]   PCP: Kathyrn Lass, MD DOB: 1919-04-26   DOA: 05/13/2017   DOS: 05/18/2017   Date of Service: the patient was seen and examined on 05/18/2017  Subjective: Remains short of breath. No chest pain and abdominal pain no nausea no vomiting or diarrhea.  Brief hospital course: Pt. with PMH of hypertension, chronic kidney disease, coronary artery disease, GERD, recent pneumonia; admitted on 05/13/2017, presented with complaint of abnormal lab, was found to have abnormal LFT as well as ongoing pneumonia. MRCP is suggesting CBD stone, GI consulted for ERCP but will require cardiac clearance and stabilization. Currently further plan is to stabilize A. fib with RVR  Assessment and Plan: 1. CBD stone with obstruction. Transaminitis AST ALT elevated as well as bilirubin. Currently LFT are trending downward Ultrasound abdomen shows presence of a liver lesion and MRI negative for any liver mass or pancreatic mass. GI currently following, highly appreciate their input. Currently the recommend to prioritize stabilization of cardiorespiratory status. If the patient requires anticoagulation and cardioversion they would defer the ERCP until the patient is able to come off of the anticoagulation. Since the patient is asymptomatic and LFTs are trending down this approach can be taken. Should the patient develop signs of cholangitis or recurrent obstruction, at that point if the patient is on anticoagulation, ERCP with stent placement without sphincterotomy can be performed while on anticoagulation as a temporizing measure until the patient can stop anticoagulation.  Hepatitis panel negative.  2. Community acquired pneumonia. Continue ceftriaxone. Severe leukocytosis, blood cultures no growth to now. X-ray shows mild evidence of pneumonia on the right side.  3. Left shoulder pain. X-ray shows no evidence of fracture likely muscular  sprain. Physical therapy consulted. Continue pain control.  4. Renal cyst. MRI suggest small simple parapelvic renal cyst in the left kidneys as well as right kidney. No further workup. No suspicious mass.  5. C/o Gross hematuria. On urinalysis here in the hospital no evidence of hematuria, hemoglobin undetectable. Likely hyperbilirubinemia causing bilirubinemia looking like hematuria. No further workup at present.  6. Coronary artery disease. Resume aspirin Hold statin. Monitor.  7. Orthostatic hypotension. Stop midodrine due to A. fib with RVR  8. A. fib with RVR.  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  Patient was given IV Lopressor as well as by mouth Lopressor every 6 hours which did not work for rate control. Cardizem drip was also started that the patient was not a to tolerate due to hypotension and also not indicated due to low EF. Cardiology was consulted, transitioning to amiodarone for rate control. Currently holding anticoagulation until GI workup is completed. 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 On amiodarone. Likely related cardioversion tomorrow.  Diet: cardiac diet DVT Prophylaxis: Heparin  Advance goals of care discussion: full code  Family Communication: family was present at bedside, at the time of interview. The pt provided permission to discuss medical plan with the family. Opportunity was given to ask question and all questions were answered satisfactorily.   Disposition:  Discharge to home.  Consultants: gastroenterology, cardiology Procedures: Echocardiogram  Antibiotics: Anti-infectives    Start     Dose/Rate Route Frequency Ordered Stop   05/15/17 2100  azithromycin (ZITHROMAX) tablet 500 mg     500 mg Oral Daily 05/15/17 1958     05/14/17 1000  azithromycin (ZITHROMAX) tablet 250 mg    Comments:  Take 2 tablets on day 1 then take 1  tablet a day thereafter.     250 mg Oral Daily 05/13/17 2012 05/15/17 0957   05/14/17 0130  cefTRIAXone (ROCEPHIN) 1 g in dextrose 5 % 50 mL IVPB     1 g 100 mL/hr over 30 Minutes Intravenous Every 24 hours 05/14/17 0121         Objective: Physical Exam: Vitals:   05/18/17 0612 05/18/17 0744 05/18/17 1157 05/18/17 1624  BP: 114/79 109/73 105/80 (!) 86/68  Pulse: (!) 131 (!) 112 (!) 130 (!) 135  Resp: (!) 29 (!) 24 (!) 24 20  Temp: 97.4 F (36.3 C) 97.6 F (36.4 C) 98.2 F (36.8 C)   TempSrc: Axillary Oral Oral   SpO2: 98% 95% 99% 95%  Weight: 68.9 kg (151 lb 14.4 oz)     Height:        Intake/Output Summary (Last 24 hours) at 05/18/17 1653 Last data filed at 05/18/17 0900  Gross per 24 hour  Intake           375.65 ml  Output              825 ml  Net          -449.35 ml   Filed Weights   05/16/17 0444 05/17/17 0504 05/18/17 0612  Weight: 70.7 kg (155 lb 12.8 oz) 70.8 kg (156 lb) 68.9 kg (151 lb 14.4 oz)   General: Alert, Awake and Oriented to Time, Place and Person. Appear in mild distress, affect appropriate Eyes: PERRL, Conjunctiva normal ENT: Oral Mucosa clear moist. Neck: no JVD, no Abnormal Mass Or lumps Cardiovascular: S1 and S2 Present, no Murmur, Peripheral Pulses Present Respiratory: normal respiratory effort, Bilateral Air entry equal and Decreased, no use of accessory muscle, bilateral Crackles, no wheezes Abdomen: Bowel Sound present, Soft and no tenderness, no hernia Skin: no redness, no Rash, no induration Extremities: no Pedal edema, no calf tenderness Neurologic: Grossly no focal neuro deficit. Bilaterally Equal motor strength  Data Reviewed: CBC:  Recent Labs Lab 05/14/17 0629 05/15/17 0506 05/16/17 0333 05/18/17 0208  WBC 24.0* 17.3* 15.5* 11.6*  NEUTROABS 22.6* 16.2* 14.0*  --   HGB 14.5 13.2 12.2* 12.3*  HCT 43.8 40.1 37.3* 36.9*  MCV 85.4 84.2 83.4 82.6  PLT 168 163 180 431   Basic Metabolic Panel:  Recent Labs Lab 05/14/17 0629  05/15/17 0506 05/16/17 0333 05/17/17 0248 05/18/17 0208  NA 139 136 135 136 134*  K 4.4 4.0 4.0 4.3 3.3*  CL 106 106 105 104 100*  CO2 21* 23 22 23 24   GLUCOSE 120* 131* 131* 154* 132*  BUN 32* 29* 39* 43* 43*  CREATININE 1.17 1.19 1.23 1.26* 1.36*  CALCIUM 8.6* 8.4* 8.2* 8.5* 8.1*  MG  --  1.9  --   --   --     Liver Function Tests:  Recent Labs Lab 05/14/17 0629 05/15/17 0506 05/16/17 0333 05/17/17 0248 05/18/17 0208  AST 86* 44* 24 19 27   ALT 123* 81* 58 47 40  ALKPHOS 306* 244* 207* 177* 157*  BILITOT 6.1* 3.1* 2.1* 1.8* 1.4*  PROT 5.4* 5.0* 5.0* 5.1* 4.8*  ALBUMIN 2.8* 2.3* 2.2* 2.3* 2.3*   No results for input(s): LIPASE, AMYLASE in the last 168 hours. No results for input(s): AMMONIA in the last 168 hours. Coagulation Profile:  Recent Labs Lab 05/13/17 1942 05/15/17 0506  INR 1.50 1.25   Cardiac Enzymes: No results for input(s): CKTOTAL, CKMB, CKMBINDEX,  TROPONINI in the last 168 hours. BNP (last 3 results) No results for input(s): PROBNP in the last 8760 hours. CBG:  Recent Labs Lab 05/17/17 1651 05/17/17 2056 05/18/17 0752 05/18/17 1156 05/18/17 1621  GLUCAP 113* 151* 110* 129* 118*   Studies: No results found.  Scheduled Meds: . azithromycin  500 mg Oral Daily  . B-complex with vitamin C  1 tablet Oral Daily  . calcium carbonate  1 tablet Oral Daily  . carvedilol  3.125 mg Oral BID WC  . docusate sodium  100 mg Oral Daily  . folic acid  1 mg Oral Daily  . furosemide  40 mg Intravenous BID  . guaiFENesin  600 mg Oral BID  . insulin aspart  0-9 Units Subcutaneous TID WC  . loratadine  10 mg Oral Daily  . magnesium oxide  200 mg Oral Daily  . pantoprazole  40 mg Oral Q1200  . tamsulosin  0.4 mg Oral QPC supper   Continuous Infusions: . amiodarone 30 mg/hr (05/18/17 1104)  . cefTRIAXone (ROCEPHIN)  IV Stopped (05/18/17 0141)  . heparin 1,250 Units/hr (05/18/17 0901)   PRN Meds: acetaminophen **OR** acetaminophen, bisacodyl,  HYDROcodone-acetaminophen, LORazepam, ondansetron **OR** ondansetron (ZOFRAN) IV, polyethylene glycol  Time spent: 35 minutes  Author: Berle Mull, MD [facility] Pager: [phone] 05/18/2017 4:53 PM  If 7PM-7AM, please contact night-coverage at [email or web address], password [facility]

## 2017-05-19 ENCOUNTER — Inpatient Hospital Stay (HOSPITAL_COMMUNITY): Payer: Medicare Other | Admitting: Anesthesiology

## 2017-05-19 ENCOUNTER — Inpatient Hospital Stay (HOSPITAL_COMMUNITY): Payer: Medicare Other

## 2017-05-19 ENCOUNTER — Encounter (HOSPITAL_COMMUNITY)
Admission: AD | Disposition: A | Discharge status: Home / Self Care | Payer: Self-pay | Source: Ambulatory Visit | Attending: Internal Medicine

## 2017-05-19 ENCOUNTER — Encounter (HOSPITAL_COMMUNITY): Payer: Self-pay | Admitting: *Deleted

## 2017-05-19 DIAGNOSIS — I340 Nonrheumatic mitral (valve) insufficiency: Secondary | ICD-10-CM

## 2017-05-19 HISTORY — PX: CARDIOVERSION: SHX1299

## 2017-05-19 HISTORY — PX: TEE WITHOUT CARDIOVERSION: SHX5443

## 2017-05-19 LAB — CULTURE, BLOOD (ROUTINE X 2)
CULTURE: NO GROWTH
CULTURE: NO GROWTH
Special Requests: ADEQUATE
Special Requests: ADEQUATE

## 2017-05-19 LAB — CBC
HEMATOCRIT: 36.6 % — AB (ref 39.0–52.0)
Hemoglobin: 12.4 g/dL — ABNORMAL LOW (ref 13.0–17.0)
MCH: 27.6 pg (ref 26.0–34.0)
MCHC: 33.9 g/dL (ref 30.0–36.0)
MCV: 81.3 fL (ref 78.0–100.0)
Platelets: 292 10*3/uL (ref 150–400)
RBC: 4.5 MIL/uL (ref 4.22–5.81)
RDW: 16.2 % — AB (ref 11.5–15.5)
WBC: 11.9 10*3/uL — ABNORMAL HIGH (ref 4.0–10.5)

## 2017-05-19 LAB — COMPREHENSIVE METABOLIC PANEL
ALBUMIN: 2.3 g/dL — AB (ref 3.5–5.0)
ALK PHOS: 143 U/L — AB (ref 38–126)
ALT: 33 U/L (ref 17–63)
AST: 24 U/L (ref 15–41)
Anion gap: 9 (ref 5–15)
BILIRUBIN TOTAL: 1.6 mg/dL — AB (ref 0.3–1.2)
BUN: 37 mg/dL — AB (ref 6–20)
CO2: 25 mmol/L (ref 22–32)
Calcium: 8 mg/dL — ABNORMAL LOW (ref 8.9–10.3)
Chloride: 97 mmol/L — ABNORMAL LOW (ref 101–111)
Creatinine, Ser: 1.36 mg/dL — ABNORMAL HIGH (ref 0.61–1.24)
GFR calc Af Amer: 49 mL/min — ABNORMAL LOW (ref >60)
GFR calc non Af Amer: 43 mL/min — ABNORMAL LOW (ref >60)
GLUCOSE: 117 mg/dL — AB (ref 65–99)
POTASSIUM: 2.8 mmol/L — AB (ref 3.5–5.1)
SODIUM: 131 mmol/L — AB (ref 135–145)
Total Protein: 4.8 g/dL — ABNORMAL LOW (ref 6.5–8.1)

## 2017-05-19 LAB — BASIC METABOLIC PANEL
ANION GAP: 9 (ref 5–15)
BUN: 36 mg/dL — ABNORMAL HIGH (ref 6–20)
CHLORIDE: 97 mmol/L — AB (ref 101–111)
CO2: 26 mmol/L (ref 22–32)
Calcium: 8.1 mg/dL — ABNORMAL LOW (ref 8.9–10.3)
Creatinine, Ser: 1.36 mg/dL — ABNORMAL HIGH (ref 0.61–1.24)
GFR calc non Af Amer: 43 mL/min — ABNORMAL LOW (ref >60)
GFR, EST AFRICAN AMERICAN: 49 mL/min — AB (ref >60)
Glucose, Bld: 125 mg/dL — ABNORMAL HIGH (ref 65–99)
POTASSIUM: 3.1 mmol/L — AB (ref 3.5–5.1)
Sodium: 132 mmol/L — ABNORMAL LOW (ref 135–145)

## 2017-05-19 LAB — MAGNESIUM: MAGNESIUM: 1.8 mg/dL (ref 1.7–2.4)

## 2017-05-19 LAB — GLUCOSE, CAPILLARY
GLUCOSE-CAPILLARY: 106 mg/dL — AB (ref 65–99)
GLUCOSE-CAPILLARY: 119 mg/dL — AB (ref 65–99)
Glucose-Capillary: 125 mg/dL — ABNORMAL HIGH (ref 65–99)
Glucose-Capillary: 141 mg/dL — ABNORMAL HIGH (ref 65–99)

## 2017-05-19 LAB — PROTIME-INR
INR: 1.25
Prothrombin Time: 15.8 seconds — ABNORMAL HIGH (ref 11.4–15.2)

## 2017-05-19 LAB — HEPARIN LEVEL (UNFRACTIONATED): Heparin Unfractionated: 0.45 IU/mL (ref 0.30–0.70)

## 2017-05-19 SURGERY — ECHOCARDIOGRAM, TRANSESOPHAGEAL
Anesthesia: General

## 2017-05-19 MED ORDER — POTASSIUM CHLORIDE 10 MEQ/100ML IV SOLN
10.0000 meq | INTRAVENOUS | Status: AC
  Administered 2017-05-19 (×3): 10 meq via INTRAVENOUS
  Filled 2017-05-19 (×3): qty 100

## 2017-05-19 MED ORDER — FUROSEMIDE 40 MG PO TABS
40.0000 mg | ORAL_TABLET | Freq: Two times a day (BID) | ORAL | Status: DC
  Administered 2017-05-19 – 2017-05-21 (×4): 40 mg via ORAL
  Filled 2017-05-19: qty 1
  Filled 2017-05-19: qty 2
  Filled 2017-05-19 (×3): qty 1

## 2017-05-19 MED ORDER — PROPOFOL 500 MG/50ML IV EMUL
INTRAVENOUS | Status: DC | PRN
  Administered 2017-05-19: 50 ug/kg/min via INTRAVENOUS

## 2017-05-19 MED ORDER — LIDOCAINE HCL (CARDIAC) 20 MG/ML IV SOLN
INTRAVENOUS | Status: DC | PRN
  Administered 2017-05-19: 50 mg via INTRATRACHEAL

## 2017-05-19 MED ORDER — SODIUM CHLORIDE 0.9 % IV SOLN: 250.0000 mL | INTRAVENOUS | Status: DC

## 2017-05-19 MED ORDER — SODIUM CHLORIDE 0.9% FLUSH: 3.0000 mL | INTRAVENOUS | Status: DC | PRN

## 2017-05-19 MED ORDER — POTASSIUM CHLORIDE 10 MEQ/100ML IV SOLN
10.0000 meq | INTRAVENOUS | Status: AC
  Administered 2017-05-19: 10 meq via INTRAVENOUS
  Filled 2017-05-19: qty 100

## 2017-05-19 MED ORDER — POTASSIUM CHLORIDE 10 MEQ/100ML IV SOLN
10.0000 meq | Freq: Once | INTRAVENOUS | Status: AC
  Administered 2017-05-19: 10 meq via INTRAVENOUS
  Filled 2017-05-19: qty 100

## 2017-05-19 MED ORDER — SODIUM CHLORIDE 0.9 % IV SOLN
INTRAVENOUS | Status: DC
  Administered 2017-05-19: 13:00:00 via INTRAVENOUS

## 2017-05-19 MED ORDER — PROPOFOL 10 MG/ML IV BOLUS
INTRAVENOUS | Status: DC | PRN
  Administered 2017-05-19: 10 mg via INTRAVENOUS

## 2017-05-19 MED ORDER — SODIUM CHLORIDE 0.9% FLUSH
3.0000 mL | Freq: Two times a day (BID) | INTRAVENOUS | Status: DC
  Administered 2017-05-20 – 2017-05-21 (×2): 3 mL via INTRAVENOUS

## 2017-05-19 MED ORDER — PHENYLEPHRINE HCL 10 MG/ML IJ SOLN
INTRAMUSCULAR | Status: DC | PRN
  Administered 2017-05-19 (×3): 80 ug via INTRAVENOUS

## 2017-05-19 MED ORDER — BUTAMBEN-TETRACAINE-BENZOCAINE 2-2-14 % EX AERO
INHALATION_SPRAY | CUTANEOUS | Status: DC | PRN
  Administered 2017-05-19: 1 via TOPICAL

## 2017-05-19 NOTE — Progress Notes (Signed)
[place] for Heparin  Indication: atrial fibrillation  Allergies  Allergen Reactions  . Sulfa Antibiotics Rash    Patient Measurements: Height: 5\' 10"  (177.8 cm) Weight: 160 lb 4.8 oz (72.7 kg) IBW/kg (Calculated) : 73 Heparin Dosing Weight: 68 kg  Vital Signs: Temp: 98.2 F (36.8 C) (06/26 0855) Temp Source: Oral (06/26 0855) BP: 107/75 (06/26 0855) Pulse Rate: 136 (06/26 0429)  Labs:  Recent Labs  05/17/17 0248 05/17/17 1705 05/18/17 0208 05/19/17 0417  HGB  --   --  12.3* 12.4*  HCT  --   --  36.9* 36.6*  PLT  --   --  223 292  HEPARINUNFRC  --  0.14* 0.43 0.45  CREATININE 1.26*  --  1.36* 1.36*    Estimated Creatinine Clearance: 33.4 mL/min (A) (by C-G formula based on SCr of 1.36 mg/dL (H)).   Assessment: 83 y.o. male continues on heparin for Afib Planning DCCV Heparin level therapeutic this AM, CBC stable  Goal of Therapy:  Heparin level 0.3-0.7 units/ml Monitor platelets by anticoagulation protocol: Yes   Plan:  Continue heparin at 1250 units / hr Follow up AM labs  Thank you Anette Guarneri, PharmD [phone]   05/19/2017

## 2017-05-19 NOTE — Interval H&P Note (Signed)
History and Physical Interval Note:  05/19/2017 1:37 PM  Bruce Green  has presented today for surgery, with the diagnosis of a fib  The various methods of treatment have been discussed with the patient and family. After consideration of risks, benefits and other options for treatment, the patient has consented to  Procedure(s): TRANSESOPHAGEAL ECHOCARDIOGRAM (TEE) (N/A) CARDIOVERSION (N/A) as a surgical intervention .  The patient's history has been reviewed, patient examined, no change in status, stable for surgery.  I have reviewed the patient's chart and labs.  Questions were answered to the patient's satisfaction.     Berdie Malter [employer]

## 2017-05-19 NOTE — CV Procedure (Signed)
Procedure: TEE  Indication: Atrial fibrillation  Sedation: Per anesthesiology.  Findings: Please see echo section for complete report.  Mildly dilated left ventricle with severe diffuse hypokinesis, EF 25-30%.  Mildly dilated right ventricle with moderately decreased systolic function.  Moderate left atrial enlargement, no LA appendage thrombus.  Mild right atrial enlargement.  Small PFO noted.  There was severe mitral regurgitation, likely functional the leaflets did not appear to completely coapt.  PISA ERO 0.68 cm^2.  Flattened/mildly reversed systolic flow on pulmonary vein doppler pattern.  Trileaflet aortic valve with mild calcification.  No aortic stenosis, trivial regurgitation.   Mild tricuspid regurgitation.  Normal caliber thoracic aorta with grade IV atherosclerosis in the arch and descending thoracic aorta.   Impression:  1. EF 25-30% with mildly dilated LV.  2. Mildly dilated RV with moderately decreased systolic function.  3. Severe, probably functional MR.  4. No LA appendage thrombus, proceed with DCCV.   Loralie Champagne 05/19/2017 1:56 PM

## 2017-05-19 NOTE — Plan of Care (Signed)
Problem: Safety: Goal: Ability to remain free from injury will improve Outcome: Progressing No falls during this admission. Call bell within reach. Bed in low and locked position. Bed alarm on. 3/4 siderails in place. Nonskid footwear being utilized. Clean and clear environment maintained. Patient verbalized understanding of safety instruction.  Problem: Pain Management: Goal: Expressions of feelings of enhanced comfort will increase Outcome: Progressing Patient denies any pain at this time. Vital signs are stable. No facial grimacing or moaning evident.

## 2017-05-19 NOTE — Anesthesia Preprocedure Evaluation (Addendum)
Anesthesia Evaluation  Patient identified by MRN, date of birth, ID band Patient awake    Reviewed: Allergy & Precautions, NPO status , Patient's Chart, lab work & pertinent test results  Airway Mallampati: II  TM Distance: >3 FB     Dental  (+) Teeth Intact, Dental Advisory Given   Pulmonary    breath sounds clear to auscultation       Cardiovascular + CAD, + CABG, + Peripheral Vascular Disease and +CHF  + dysrhythmias  Rhythm:Regular Rate:Normal     Neuro/Psych Anxiety TIA Neuromuscular disease    GI/Hepatic Neg liver ROS, hiatal hernia, GERD  Medicated,  Endo/Other  negative endocrine ROS  Renal/GU CRFRenal disease  negative genitourinary   Musculoskeletal negative musculoskeletal ROS (+)   Abdominal   Peds negative pediatric ROS (+)  Hematology negative hematology ROS (+)   Anesthesia Other Findings   Reproductive/Obstetrics negative OB ROS                            Lab Results  Component Value Date   WBC 11.9 (H) 05/19/2017   HGB 12.4 (L) 05/19/2017   HCT 36.6 (L) 05/19/2017   MCV 81.3 05/19/2017   PLT 292 05/19/2017   Lab Results  Component Value Date   CREATININE 1.36 (H) 05/19/2017   BUN 36 (H) 05/19/2017   NA 132 (L) 05/19/2017   K 3.1 (L) 05/19/2017   CL 97 (L) 05/19/2017   CO2 26 05/19/2017   Lab Results  Component Value Date   INR 1.25 05/19/2017   INR 1.25 05/15/2017   INR 1.50 05/13/2017   04/2017 EKG: atrial fibrillation.  04/2017 Echo - Left ventricle: EF hard to judge due to rapid afib. Inferior and   septal akinesis The cavity size was moderately dilated. Wall   thickness was normal. Systolic function was severely reduced. The   estimated ejection fraction was in the range of 25% to 30%. The   study is not technically sufficient to allow evaluation of LV   diastolic function. - Aortic valve: There was mild regurgitation. - Mitral valve: There was  moderate regurgitation. - Left atrium: The atrium was moderately dilated. - Right atrium: The atrium was mildly dilated. - Atrial septum: No defect or patent foramen ovale was identified. - Tricuspid valve: There was moderate regurgitation. - Pulmonary arteries: PA peak pressure: 31 mm Hg (S).   Anesthesia Physical Anesthesia Plan  ASA: III  Anesthesia Plan: General   Post-op Pain Management:    Induction: Intravenous  PONV Risk Score and Plan: Propofol  Airway Management Planned: Natural Airway and Nasal Cannula  Additional Equipment:   Intra-op Plan:   Post-operative Plan:   Informed Consent: I have reviewed the patients History and Physical, chart, labs and discussed the procedure including the risks, benefits and alternatives for the proposed anesthesia with the patient or authorized representative who has indicated his/her understanding and acceptance.     Plan Discussed with: CRNA and Anesthesiologist  Anesthesia Plan Comments:        Anesthesia Quick Evaluation

## 2017-05-19 NOTE — Progress Notes (Signed)
  Echocardiogram Echocardiogram Transesophageal has been performed.  Jennette Dubin 05/19/2017, 2:03 PM

## 2017-05-19 NOTE — Progress Notes (Signed)
12 lead EKG obtained.  Long Qtc >500, critical result per EKG tech.  Notified Dr. Aundra Dubin, no intervention as pt on amiodarone per Dr. Aundra Dubin.

## 2017-05-19 NOTE — Transfer of Care (Signed)
Immediate Anesthesia Transfer of Care Note  Patient: Bruce Green  Procedure(s) Performed: Procedure(s): TRANSESOPHAGEAL ECHOCARDIOGRAM (TEE) (N/A) CARDIOVERSION (N/A)  Patient Location: Endoscopy Unit  Anesthesia Type:MAC  Level of Consciousness: awake, alert  and oriented  Airway & Oxygen Therapy: Patient Spontanous Breathing and Patient connected to nasal cannula oxygen  Post-op Assessment: Report given to RN, Post -op Vital signs reviewed and stable and Patient moving all extremities X 4  Post vital signs: Reviewed and stable  Last Vitals:  Vitals:   05/19/17 1420 05/19/17 1430  BP: 102/70 121/77  Pulse: 72 74  Resp: (!) 22 (!) 22  Temp:      Last Pain:  Vitals:   05/19/17 1414  TempSrc: Oral  PainSc:       Patients Stated Pain Goal: 3 (32/67/12 4580)  Complications: No apparent anesthesia complications

## 2017-05-19 NOTE — Progress Notes (Signed)
OT Cancellation Note  Patient Details Name: Bruce Green MRN: [id number] DOB: 26-Jun-1919   Cancelled Treatment:    Reason Eval/Treat Not Completed: Medical issues which prohibited therapy Pt continues to be in A-fib with RVR with HRs in 130s-140s at rest. Plan for cardioversion this AM. Will continue to follow.  Malka So 05/19/2017, 10:15 AM  [phone]

## 2017-05-19 NOTE — Care Management Note (Addendum)
Case Management Note  Patient Details  Name: Bruce Green MRN: [id number] Date of Birth: 16-Apr-1919  Subjective/Objective: Pt presented for Gross Hematuria. Increased LFT's- Abdominal Ultrasound revealed a liver lesion. MRCP completed 05-16-17 question CBD stones. Plan for [facility] outpatient. Post MRCP pt went into Afib- initiated on IV Amio gtt. On IV Rocephin for Pneumonia. Pt is from home alone and has support of daughter that lives 10 miles away. Pt does not drive anymore, but is active. Per daughter he is very careful at home. Daughter prepares meals and takes him to appointments. Daughter would like for patient to go for short term rehab before home. Pt has several canes and RW's at home, however he does not use them. CM did make CSW aware. PT/OT consult placed.                     Action/Plan: CM will continue to monitor for disposition needs.   Expected Discharge Date:                  Expected Discharge Plan:  [place]  In-House Referral:  Clinical Social Work (Daughter would like for pt to have some rehab once d/c. PT to evaluate. SW aware. )  Discharge planning Services  CM Consult  Post Acute Care Choice:    Choice offered to:     DME Arranged:    DME Agency:     HH Arranged:    HH Agency:     Status of Service:  In process, will continue to follow  If discussed at Long Length of Stay Meetings, dates discussed:    Additional Comments: 1212 05-20-17 Jacqlyn Krauss, RN, BSN [phone] Benefits Check Completed:  Eliquis 2.5mg  PO BID is covered. Patient can pick up prescription at pharmacy he uses which [facility]. 30 day supply is a copay of $45 and 90 day supply is $125.    05-20-17 0948 Jacqlyn Krauss, RN,BSN [phone] Benefits Check in process for Eliquis. CM will make pt aware of cost once completed.  Bethena Roys, RN 05/19/2017, 12:28 PM

## 2017-05-19 NOTE — Procedures (Signed)
Electrical Cardioversion Procedure Note CORNEL WERBER [id number] June 04, 1919  Procedure: Electrical Cardioversion Indications:  Atrial Fibrillation  Procedure Details Consent: Risks of procedure as well as the alternatives and risks of each were explained to the (patient/caregiver).  Consent for procedure obtained. Time Out: Verified patient identification, verified procedure, site/side was marked, verified correct patient position, special equipment/implants available, medications/allergies/relevent history reviewed, required imaging and test results available.  Performed  Patient placed on cardiac monitor, pulse oximetry, supplemental oxygen as necessary.  Sedation given: Propofol per anesthesiology. Pacer pads placed anterior and posterior chest.  Cardioverted 1 time(s).  Cardioverted at [place].  Evaluation Findings: Post procedure EKG shows: NSR Complications: None Patient did tolerate procedure well.   Loralie Champagne 05/19/2017, 1:57 PM

## 2017-05-19 NOTE — Progress Notes (Signed)
[facility] Progress Note  Bruce Green 83 y.o. [birth year]  CC:  Choledocholithiasis   Subjective: Patient resting comfortably in the bed. Daughter at bedside. Denied any acute events since yesterday. Remains in A. fib with RVR. Some improvement in blood pressure noted.  ROS : Positive for mild shortness of breath. Negative for chest pain   Objective: Vital signs in last 24 hours: Vitals:   05/19/17 0429 05/19/17 0855  BP: 92/74 107/75  Pulse: (!) 136   Resp: (!) 22   Temp: 98.1 F (36.7 C) 98.2 F (36.8 C)    Physical Exam:  General:  Resting comfortably in the bed         Lungs:   Bilateral crackles in the lower aspect. Not in apparent distress . Anterior exam only   Heart:  Irregularly irregular heart rate with tachycardia   Abdomen:   Soft, non-tender, bowel sounds active all four quadrants,  no masses, No peritoneal signs   Extremities: Extremities normal, atraumatic, no  edema       Lab Results:  Recent Labs  05/18/17 0208 05/19/17 0417  NA 134* 131*  K 3.3* 2.8*  CL 100* 97*  CO2 24 25  GLUCOSE 132* 117*  BUN 43* 37*  CREATININE 1.36* 1.36*  CALCIUM 8.1* 8.0*  MG  --  1.8    Recent Labs  05/18/17 0208 05/19/17 0417  AST 27 24  ALT 40 33  ALKPHOS 157* 143*  BILITOT 1.4* 1.6*  PROT 4.8* 4.8*  ALBUMIN 2.3* 2.3*    Recent Labs  05/18/17 0208 05/19/17 0417  WBC 11.6* 11.9*  HGB 12.3* 12.4*  HCT 36.9* 36.6*  MCV 82.6 81.3  PLT 223 292   No results for input(s): LABPROT, INR in the last 72 hours.    Assessment/Plan: - Choledocholithiasis with the multiple stones within the CBD as well as a dilated CBD up to 17 mm - Abnormal LFTs. Trending down. - A. fib with RVR. Currently on heparin And amiodarone drip - SOB  probably from community acquired pneumonia.  Recommendations ----------------------- - Patient's LFTs are stable. According to daughter, he is scheduled for cardioversion today. Currently remains on heparin and  amiodarone drip. - recommend  ERCP with sphincterotomy and stone extractions once A. fib has been stabilized and patient is off anticoagulation. - GI will follow  Otis Brace MD, [place] 05/19/2017, 9:48 AM  Pager [phone]  If no answer or after 5 PM call [phone]

## 2017-05-19 NOTE — Progress Notes (Signed)
Progress Note  Patient Name: Bruce Green Date of Encounter: 05/19/2017  Primary Cardiologist: Dr. Sallyanne Kuster   Subjective   Feels fine today. No complaints.   Inpatient Medications    Scheduled Meds: . azithromycin  500 mg Oral Daily  . B-complex with vitamin C  1 tablet Oral Daily  . calcium carbonate  1 tablet Oral Daily  . carvedilol  3.125 mg Oral BID WC  . docusate sodium  100 mg Oral Daily  . folic acid  1 mg Oral Daily  . furosemide  40 mg Intravenous BID  . guaiFENesin  600 mg Oral BID  . insulin aspart  0-9 Units Subcutaneous TID WC  . loratadine  10 mg Oral Daily  . magnesium oxide  200 mg Oral Daily  . pantoprazole  40 mg Oral Q1200  . sodium chloride flush  3 mL Intravenous Q12H  . tamsulosin  0.4 mg Oral QPC supper   Continuous Infusions: . sodium chloride    . sodium chloride    . amiodarone 30 mg/hr (05/19/17 0100)  . cefTRIAXone (ROCEPHIN)  IV Stopped (05/19/17 0131)  . heparin 1,250 Units/hr (05/19/17 0606)  . potassium chloride 10 mEq (05/19/17 0651)   PRN Meds: acetaminophen **OR** acetaminophen, bisacodyl, HYDROcodone-acetaminophen, LORazepam, ondansetron **OR** ondansetron (ZOFRAN) IV, polyethylene glycol, sodium chloride flush   Vital Signs    Vitals:   05/18/17 2004 05/18/17 2356 05/19/17 0429 05/19/17 0855  BP: 92/77 105/79 92/74 107/75  Pulse: (!) 131 (!) 125 (!) 136   Resp: (!) 24 (!) 27 (!) 22   Temp:   98.1 F (36.7 C) 98.2 F (36.8 C)  TempSrc:   Oral Oral  SpO2: 97% 94% 95% 97%  Weight:   160 lb 4.8 oz (72.7 kg)   Height:        Intake/Output Summary (Last 24 hours) at 05/19/17 1039 Last data filed at 05/19/17 0606  Gross per 24 hour  Intake            591.2 ml  Output              850 ml  Net           -258.8 ml   Filed Weights   05/17/17 0504 05/18/17 0612 05/19/17 0429  Weight: 156 lb (70.8 kg) 151 lb 14.4 oz (68.9 kg) 160 lb 4.8 oz (72.7 kg)    Telemetry    Atrial Fibrillation/Flutter in the 110s-120s -  Personally Reviewed  ECG    Atrial flutter w/ RVR - Personally Reviewed  Physical Exam   GEN: No acute distress.  Elderly  Neck: No JVD Cardiac: irregular rhythm, tachy rate, no murmurs, rubs, or gallops.  Respiratory: Clear to auscultation bilaterally. GI: Soft, nontender, non-distended  MS: No edema; No deformity. Neuro:  Nonfocal  Psych: Normal affect   Labs    Chemistry Recent Labs Lab 05/17/17 0248 05/18/17 0208 05/19/17 0417  NA 136 134* 131*  K 4.3 3.3* 2.8*  CL 104 100* 97*  CO2 23 24 25   GLUCOSE 154* 132* 117*  BUN 43* 43* 37*  CREATININE 1.26* 1.36* 1.36*  CALCIUM 8.5* 8.1* 8.0*  PROT 5.1* 4.8* 4.8*  ALBUMIN 2.3* 2.3* 2.3*  AST 19 27 24   ALT 47 40 33  ALKPHOS 177* 157* 143*  BILITOT 1.8* 1.4* 1.6*  GFRNONAA 47* 43* 43*  GFRAA 54* 49* 49*  ANIONGAP 9 10 9      Hematology Recent Labs Lab 05/16/17 4332 05/18/17 9518 05/19/17 0417  WBC 15.5* 11.6* 11.9*  RBC 4.47 4.47 4.50  HGB 12.2* 12.3* 12.4*  HCT 37.3* 36.9* 36.6*  MCV 83.4 82.6 81.3  MCH 27.3 27.5 27.6  MCHC 32.7 33.3 33.9  RDW 16.3* 16.2* 16.2*  PLT 180 223 292    Cardiac EnzymesNo results for input(s): TROPONINI in the last 168 hours. No results for input(s): TROPIPOC in the last 168 hours.   BNP Recent Labs Lab 05/14/17 1133  BNP 850.4*     DDimer No results for input(s): DDIMER in the last 168 hours.   Radiology    Dg Chest [place] 1v Same Day  Result Date: 05/17/2017 CLINICAL DATA:  CHF. EXAM: PORTABLE CHEST 1 VIEW COMPARISON:  May 14, 2017 FINDINGS: No pneumothorax. Persistent patchy opacity in the right mid and lower lung and in the left base. Cardiomegaly. No other interval changes. IMPRESSION: Persistent patchy opacities in the right mid and lower lung in addition to the left base. Electronically Signed   By: Dorise Bullion III M.D   On: 05/17/2017 14:09    Cardiac Studies   2D Echo 05/16/17 Study Conclusions  - Left ventricle: EF hard to judge due to rapid afib.  Inferior and septal akinesis The cavity size was moderately dilated. Wall thickness was normal. Systolic function was severely reduced. The estimated ejection fraction was in the range of 25% to 30%. The study is not technically sufficient to allow evaluation of LV diastolic function. - Aortic valve: There was mild regurgitation. - Mitral valve: There was moderate regurgitation. - Left atrium: The atrium was moderately dilated. - Right atrium: The atrium was mildly dilated. - Atrial septum: No defect or patent foramen ovale was identified. - Tricuspid valve: There was moderate regurgitation. - Pulmonary arteries: PA peak pressure: 31 mm Hg (S).  TEE/DCCV- Pending     Patient Profile     83 y.o.male with a h/o CAD s/p CABG, ischemic DCM w/ EF of 25-30% and orthostatic hypotension, on midodrine. Admitted 05/13/17 with LFT abnormalities and retained CBD stone (GI following) as well as ongoing PNA. Cardiology consulted for atrial fibrillation w/ RVR.   Assessment & Plan    1. Common Bile Duct Stone w/ Obstruction: improving with antibiotics. LFTs downtrending. Pt's symptoms improved. GI has been following. GI recommends stabilization of patient's cardiorespiratory status prior to ERCP. Per notes from GI and IM, "If the patient requires anticoagulation and cardioversion they would defer the ERCP until the patient is able to come off of the anticoagulation.Since the patient is asymptomatic and LFTs are trending down this approach can be taken.Should the patient develop signs of cholangitis or recurrent obstruction, at that point if the patient is on anticoagulation, ERCP with stent placement without sphincterotomy can be performed while on anticoagulation as a temporizing measure until the patient can stop anticoagulation".  2. CAP: on antibiotics. Management per IM.   3. Atrial Fibrillation/Flutter w/ RVR: in the setting of CBD obstruction and PNA. On antibiotics with clinical  improvement. HFTs improved. Also with mild renal insufficiency and hypokalemia, 2.8. Mg 1.8. He is currently receiving IV K supplementation. Continue treatment of underlying conditions, which are all likely driving his arrhthymia. Continue IV amiodarone for rate control, given limited ability to tolerate high doses of BB, given h/o orthostatic hypotension. His BP is tolerating low dose Coreg. We are avoiding CCB given h/o hypotension and chronic systolic HF with EF of 40-98%. He continues to have rapid ventricular response, despite IV amiodarone and BB. We will plan for TEE DCCV  today. Continue IV heparin for a/c. This patients CHA2DS2-VASc Score and unadjusted Ischemic Stroke Rate (% per year) is equal to 4.8 % stroke rate/year from a score of 4. He will need DOAC post cardioversion, to be continued for a minimum of 4 weeks.   4. Hypokalemia: K 2.8. Receiving IV K supplementation. Mg stable at 1.8. Morning Lasix dose is currently on hold. Repeat BMP later today.   5. Renal Insufficiency: SCr stable over the past 24 hrs, still at 1.36. Continue to monitor.   6. Ischemic, Dilated CM: EF 25-30%. Denies any significant dyspnea. He has been receiving lasix, however morning dose on hold given hypokalemia at 2.8. Monitor volume status.    Signed, Lyda Jester, PA-C  05/19/2017, 10:39 AM    History and all data above reviewed.  Patient examined.  I agree with the findings as above.  He denies any pain or SOB.  The patient exam reveals COR:RRR (flutter)  ,  Lungs: Left greater than right basilar crackles  ,  Abd: Positive bowel sounds, no rebound no guarding, Ext No edema  .  All available labs, radiology testing, previous records reviewed. Agree with documented assessment and plan. Atrial flutter:  TEE/DCCV today.  Dilated cardiomyopathy/chronic systolic HF:  Seems to be relatively euvolemic.  I will change to PO Lasix.   Jeneen Rinks Heavin Sebree  11:22 AM  05/19/2017

## 2017-05-19 NOTE — Progress Notes (Signed)
PT Cancellation Note  Patient Details Name: Bruce Green MRN: [id number] DOB: 10/24/1919   Cancelled Treatment:    Reason Eval/Treat Not Completed: Medical issues which prohibited therapy Pt continues to be in A-fib with RVR with HRs in 130s-140s at rest. Plan for cardioversion this AM. Will follow up in PM as time allows.   Marguarite Arbour A Damione Robideau 05/19/2017, 9:53 AM Wray Kearns, PT, DPT [phone]

## 2017-05-19 NOTE — H&P (View-Only) (Signed)
Progress Note  Patient Name: Bruce Green Date of Encounter: 05/19/2017  Primary Cardiologist: Dr. Sallyanne Kuster   Subjective   Feels fine today. No complaints.   Inpatient Medications    Scheduled Meds: . azithromycin  500 mg Oral Daily  . B-complex with vitamin C  1 tablet Oral Daily  . calcium carbonate  1 tablet Oral Daily  . carvedilol  3.125 mg Oral BID WC  . docusate sodium  100 mg Oral Daily  . folic acid  1 mg Oral Daily  . furosemide  40 mg Intravenous BID  . guaiFENesin  600 mg Oral BID  . insulin aspart  0-9 Units Subcutaneous TID WC  . loratadine  10 mg Oral Daily  . magnesium oxide  200 mg Oral Daily  . pantoprazole  40 mg Oral Q1200  . sodium chloride flush  3 mL Intravenous Q12H  . tamsulosin  0.4 mg Oral QPC supper   Continuous Infusions: . sodium chloride    . sodium chloride    . amiodarone 30 mg/hr (05/19/17 0100)  . cefTRIAXone (ROCEPHIN)  IV Stopped (05/19/17 0131)  . heparin 1,250 Units/hr (05/19/17 0606)  . potassium chloride 10 mEq (05/19/17 0651)   PRN Meds: acetaminophen **OR** acetaminophen, bisacodyl, HYDROcodone-acetaminophen, LORazepam, ondansetron **OR** ondansetron (ZOFRAN) IV, polyethylene glycol, sodium chloride flush   Vital Signs    Vitals:   05/18/17 2004 05/18/17 2356 05/19/17 0429 05/19/17 0855  BP: 92/77 105/79 92/74 107/75  Pulse: (!) 131 (!) 125 (!) 136   Resp: (!) 24 (!) 27 (!) 22   Temp:   98.1 F (36.7 C) 98.2 F (36.8 C)  TempSrc:   Oral Oral  SpO2: 97% 94% 95% 97%  Weight:   160 lb 4.8 oz (72.7 kg)   Height:        Intake/Output Summary (Last 24 hours) at 05/19/17 1039 Last data filed at 05/19/17 0606  Gross per 24 hour  Intake            591.2 ml  Output              850 ml  Net           -258.8 ml   Filed Weights   05/17/17 0504 05/18/17 0612 05/19/17 0429  Weight: 156 lb (70.8 kg) 151 lb 14.4 oz (68.9 kg) 160 lb 4.8 oz (72.7 kg)    Telemetry    Atrial Fibrillation/Flutter in the 110s-120s -  Personally Reviewed  ECG    Atrial flutter w/ RVR - Personally Reviewed  Physical Exam   GEN: No acute distress.  Elderly  Neck: No JVD Cardiac: irregular rhythm, tachy rate, no murmurs, rubs, or gallops.  Respiratory: Clear to auscultation bilaterally. GI: Soft, nontender, non-distended  MS: No edema; No deformity. Neuro:  Nonfocal  Psych: Normal affect   Labs    Chemistry Recent Labs Lab 05/17/17 0248 05/18/17 0208 05/19/17 0417  NA 136 134* 131*  K 4.3 3.3* 2.8*  CL 104 100* 97*  CO2 23 24 25   GLUCOSE 154* 132* 117*  BUN 43* 43* 37*  CREATININE 1.26* 1.36* 1.36*  CALCIUM 8.5* 8.1* 8.0*  PROT 5.1* 4.8* 4.8*  ALBUMIN 2.3* 2.3* 2.3*  AST 19 27 24   ALT 47 40 33  ALKPHOS 177* 157* 143*  BILITOT 1.8* 1.4* 1.6*  GFRNONAA 47* 43* 43*  GFRAA 54* 49* 49*  ANIONGAP 9 10 9      Hematology Recent Labs Lab 05/16/17 6269 05/18/17 4854 05/19/17 0417  WBC 15.5* 11.6* 11.9*  RBC 4.47 4.47 4.50  HGB 12.2* 12.3* 12.4*  HCT 37.3* 36.9* 36.6*  MCV 83.4 82.6 81.3  MCH 27.3 27.5 27.6  MCHC 32.7 33.3 33.9  RDW 16.3* 16.2* 16.2*  PLT 180 223 292    Cardiac EnzymesNo results for input(s): TROPONINI in the last 168 hours. No results for input(s): TROPIPOC in the last 168 hours.   BNP Recent Labs Lab 05/14/17 1133  BNP 850.4*     DDimer No results for input(s): DDIMER in the last 168 hours.   Radiology    Dg Chest [place] 1v Same Day  Result Date: 05/17/2017 CLINICAL DATA:  CHF. EXAM: PORTABLE CHEST 1 VIEW COMPARISON:  May 14, 2017 FINDINGS: No pneumothorax. Persistent patchy opacity in the right mid and lower lung and in the left base. Cardiomegaly. No other interval changes. IMPRESSION: Persistent patchy opacities in the right mid and lower lung in addition to the left base. Electronically Signed   By: Dorise Bullion III M.D   On: 05/17/2017 14:09    Cardiac Studies   2D Echo 05/16/17 Study Conclusions  - Left ventricle: EF hard to judge due to rapid afib.  Inferior and septal akinesis The cavity size was moderately dilated. Wall thickness was normal. Systolic function was severely reduced. The estimated ejection fraction was in the range of 25% to 30%. The study is not technically sufficient to allow evaluation of LV diastolic function. - Aortic valve: There was mild regurgitation. - Mitral valve: There was moderate regurgitation. - Left atrium: The atrium was moderately dilated. - Right atrium: The atrium was mildly dilated. - Atrial septum: No defect or patent foramen ovale was identified. - Tricuspid valve: There was moderate regurgitation. - Pulmonary arteries: PA peak pressure: 31 mm Hg (S).  TEE/DCCV- Pending     Patient Profile     83 y.o.male with a h/o CAD s/p CABG, ischemic DCM w/ EF of 25-30% and orthostatic hypotension, on midodrine. Admitted 05/13/17 with LFT abnormalities and retained CBD stone (GI following) as well as ongoing PNA. Cardiology consulted for atrial fibrillation w/ RVR.   Assessment & Plan    1. Common Bile Duct Stone w/ Obstruction: improving with antibiotics. LFTs downtrending. Pt's symptoms improved. GI has been following. GI recommends stabilization of patient's cardiorespiratory status prior to ERCP. Per notes from GI and IM, "If the patient requires anticoagulation and cardioversion they would defer the ERCP until the patient is able to come off of the anticoagulation.Since the patient is asymptomatic and LFTs are trending down this approach can be taken.Should the patient develop signs of cholangitis or recurrent obstruction, at that point if the patient is on anticoagulation, ERCP with stent placement without sphincterotomy can be performed while on anticoagulation as a temporizing measure until the patient can stop anticoagulation".  2. CAP: on antibiotics. Management per IM.   3. Atrial Fibrillation/Flutter w/ RVR: in the setting of CBD obstruction and PNA. On antibiotics with clinical  improvement. HFTs improved. Also with mild renal insufficiency and hypokalemia, 2.8. Mg 1.8. He is currently receiving IV K supplementation. Continue treatment of underlying conditions, which are all likely driving his arrhthymia. Continue IV amiodarone for rate control, given limited ability to tolerate high doses of BB, given h/o orthostatic hypotension. His BP is tolerating low dose Coreg. We are avoiding CCB given h/o hypotension and chronic systolic HF with EF of 77-41%. He continues to have rapid ventricular response, despite IV amiodarone and BB. We will plan for TEE DCCV  today. Continue IV heparin for a/c. This patients CHA2DS2-VASc Score and unadjusted Ischemic Stroke Rate (% per year) is equal to 4.8 % stroke rate/year from a score of 4. He will need DOAC post cardioversion, to be continued for a minimum of 4 weeks.   4. Hypokalemia: K 2.8. Receiving IV K supplementation. Mg stable at 1.8. Morning Lasix dose is currently on hold. Repeat BMP later today.   5. Renal Insufficiency: SCr stable over the past 24 hrs, still at 1.36. Continue to monitor.   6. Ischemic, Dilated CM: EF 25-30%. Denies any significant dyspnea. He has been receiving lasix, however morning dose on hold given hypokalemia at 2.8. Monitor volume status.    Signed, Lyda Jester, PA-C  05/19/2017, 10:39 AM    History and all data above reviewed.  Patient examined.  I agree with the findings as above.  He denies any pain or SOB.  The patient exam reveals COR:RRR (flutter)  ,  Lungs: Left greater than right basilar crackles  ,  Abd: Positive bowel sounds, no rebound no guarding, Ext No edema  .  All available labs, radiology testing, previous records reviewed. Agree with documented assessment and plan. Atrial flutter:  TEE/DCCV today.  Dilated cardiomyopathy/chronic systolic HF:  Seems to be relatively euvolemic.  I will change to PO Lasix.   Jeneen Rinks Zaray Gatchel  11:22 AM  05/19/2017

## 2017-05-19 NOTE — Progress Notes (Addendum)
[facility] Progress Note  Patient: Bruce Green [id number]:[id number]   PCP: Kathyrn Lass, MD DOB: 10/04/1919   DOA: 05/13/2017   DOS: 05/19/2017   Date of Service: the patient was seen and examined on 05/19/2017  Subjective: Patient continues to mention that he is feeling great but is visibly short of breath and fatigue. No chest pain and abdominal pain and nausea and vomiting. Remains constipated.  Brief hospital course: Pt. with PMH of hypertension, chronic kidney disease, coronary artery disease, GERD, recent pneumonia; admitted on 05/13/2017, presented with complaint of abnormal lab, was found to have abnormal LFT as well as ongoing pneumonia. MRCP is suggesting CBD stone, GI consulted for ERCP but will require cardiac clearance and stabilization. Currently further plan is to stabilize A. fib with RVR  Assessment and Plan: 1. CBD stone with obstruction. Transaminitis AST ALT elevated as well as bilirubin. Currently LFT are trending downward. Ultrasound abdomen shows presence of a liver lesion and MRI negative for any liver mass or pancreatic mass. GI currently following, highly appreciate their input. Currently, recommend to prioritize stabilization of cardiorespiratory status. If the patient requires anticoagulation and cardioversion they would defer the ERCP until the patient is able to come off of the anticoagulation. Since the patient is asymptomatic and LFTs are trending down this approach can be taken. Should the patient develop signs of cholangitis or recurrent obstruction, at that point if the patient is on anticoagulation, ERCP with stent placement without sphincterotomy can be performed while on anticoagulation as a temporizing measure until the patient can stop anticoagulation. Hepatitis panel negative.  2. Community acquired pneumonia. Continue ceftriaxone. Completed azithromycin Severe leukocytosis, blood cultures no growth to now. Likely transition to oral tomorrow.  Incentive spirometry provided. X-ray shows mild evidence of pneumonia on the right side.  3. Left shoulder pain. X-ray shows no evidence of fracture likely muscular sprain. Physical therapy consulted. Continue pain control.  4. Renal cyst. Liver lesion. MRI suggest small simple parapelvic renal cyst in the left kidneys as well as right kidney. No further workup. No other suspicious mass.  5. C/o Gross hematuria. On urinalysis here in the hospital no evidence of hematuria, hemoglobin undetectable. Likely hyperbilirubinemia causing bilirubinemia looking like hematuria. No further workup at present.  6. Coronary artery disease. Resume aspirin Hold statin. Monitor.  7. Orthostatic hypotension. Stop midodrine due to A. fib with RVR  8. A. fib with RVR. 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 Cardizem drip was also started that the patient was not able to tolerate due to hypotension and also not indicated due to low EF. Cardiology was consulted,given amiodarone for rate control. 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 Still remains in RVR, underwent DCCV on 05/19/2017. Normal sinus rhythm following that. Mild elongation of QTC.  Continue monitoring on telemetry.  9. Hypokalemia. Potassium 2.8 this morning. Replace. Recheck tomorrow.  Diet: Cardiac diet DVT Prophylaxis: on therapeutic anticoagulation.  Advance goals of care discussion: full code  Family Communication: family was present at bedside, at the time of interview. The pt provided permission to discuss medical plan with the family. Opportunity was given to ask question and all questions were answered satisfactorily.   Disposition:  Discharge to likely SNF. Unable to complete PT evaluation due to  RVR.Marland Kitchen  Consultants: cardiology, gastroenterology Procedures: Echocardiogram, TEE,  DC CV  Antibiotics: Anti-infectives    Start     Dose/Rate Route Frequency Ordered Stop   05/15/17 2100  azithromycin (ZITHROMAX) tablet 500 mg     500 mg Oral Daily 05/15/17 1958     05/14/17 1000  azithromycin (ZITHROMAX) tablet 250 mg    Comments:  Take 2 tablets on day 1 then take 1 tablet a day thereafter.     250 mg Oral Daily 05/13/17 2012 05/15/17 0957   05/14/17 0130  cefTRIAXone (ROCEPHIN) 1 g in dextrose 5 % 50 mL IVPB     1 g 100 mL/hr over 30 Minutes Intravenous Every 24 hours 05/14/17 0121         Objective: Physical Exam: Vitals:   05/19/17 1420 05/19/17 1430 05/19/17 1502 05/19/17 1621  BP: 102/70 121/77 (!) 90/55   Pulse: 72 74    Resp: (!) 22 (!) 22    Temp:    98.1 F (36.7 C)  TempSrc:    Oral  SpO2: 96% 97%  100%  Weight:      Height:        Intake/Output Summary (Last 24 hours) at 05/19/17 1851 Last data filed at 05/19/17 1355  Gross per 24 hour  Intake            671.2 ml  Output                0 ml  Net            671.2 ml   Filed Weights   05/17/17 0504 05/18/17 0612 05/19/17 0429  Weight: 70.8 kg (156 lb) 68.9 kg (151 lb 14.4 oz) 72.7 kg (160 lb 4.8 oz)   General: Alert, Awake and Oriented to Time, Place and Person. Appear in mild distress, affect appropriate Eyes: PERRL, Conjunctiva normal ENT: Oral Mucosa clear moist. Neck: no JVD, no Abnormal Mass Or lumps Cardiovascular: S1 and S2 Present, no Murmur, Peripheral Pulses Present Respiratory: increased respiratory effort, Bilateral Air entry equal and Decreased, no use of accessory muscle, right basal Crackles, no wheezes Abdomen: Bowel Sound present, Soft and no tenderness, no hernia Skin: no redness, no Rash, no induration Extremities: no Pedal edema, no calf tenderness Neurologic: Grossly no focal neuro deficit. Bilaterally Equal motor strength  Data Reviewed: CBC:  Recent Labs Lab 05/14/17 0629  05/15/17 0506 05/16/17 0333 05/18/17 0208 05/19/17 0417  WBC 24.0* 17.3* 15.5* 11.6* 11.9*  NEUTROABS 22.6* 16.2* 14.0*  --   --   HGB 14.5 13.2 12.2* 12.3* 12.4*  HCT 43.8 40.1 37.3* 36.9* 36.6*  MCV 85.4 84.2 83.4 82.6 81.3  PLT 168 163 180 223 767   Basic Metabolic Panel:  Recent Labs Lab 05/15/17 0506 05/16/17 0333 05/17/17 0248 05/18/17 0208 05/19/17 0417 05/19/17 0954  NA 136 135 136 134* 131* 132*  K 4.0 4.0 4.3 3.3* 2.8* 3.1*  CL 106 105 104 100* 97* 97*  CO2 23 22 23 24 25 26   GLUCOSE 131* 131* 154* 132* 117* 125*  BUN 29* 39* 43* 43* 37* 36*  CREATININE 1.19 1.23 1.26* 1.36* 1.36* 1.36*  CALCIUM 8.4* 8.2* 8.5* 8.1* 8.0* 8.1*  MG 1.9  --   --   --  1.8  --     Liver Function Tests:  Recent Labs Lab 05/15/17 0506 05/16/17 0333 05/17/17 0248 05/18/17 0208 05/19/17 0417  AST 44* 24 19 27 24   ALT 81* 58 47 40 33  ALKPHOS 244* 207* 177* 157* 143*  BILITOT 3.1* 2.1*  1.8* 1.4* 1.6*  PROT 5.0* 5.0* 5.1* 4.8* 4.8*  ALBUMIN 2.3* 2.2* 2.3* 2.3* 2.3*   No results for input(s): LIPASE, AMYLASE in the last 168 hours. No results for input(s): AMMONIA in the last 168 hours. Coagulation Profile:  Recent Labs Lab 05/13/17 1942 05/15/17 0506 05/19/17 0954  INR 1.50 1.25 1.25   Cardiac Enzymes: No results for input(s): CKTOTAL, CKMB, CKMBINDEX, TROPONINI in the last 168 hours. BNP (last 3 results) No results for input(s): PROBNP in the last 8760 hours. CBG:  Recent Labs Lab 05/18/17 1621 05/18/17 2046 05/19/17 0740 05/19/17 1136 05/19/17 1619  GLUCAP 118* 138* 125* 106* 119*   Studies: No results found.  Scheduled Meds: . azithromycin  500 mg Oral Daily  . B-complex with vitamin C  1 tablet Oral Daily  . calcium carbonate  1 tablet Oral Daily  . carvedilol  3.125 mg Oral BID WC  . docusate sodium  100 mg Oral Daily  . folic acid  1 mg Oral Daily  . furosemide  40 mg Oral BID  . guaiFENesin  600 mg Oral BID  . insulin aspart  0-9 Units  Subcutaneous TID WC  . loratadine  10 mg Oral Daily  . magnesium oxide  200 mg Oral Daily  . pantoprazole  40 mg Oral Q1200  . sodium chloride flush  3 mL Intravenous Q12H  . tamsulosin  0.4 mg Oral QPC supper   Continuous Infusions: . sodium chloride    . amiodarone 30 mg/hr (05/19/17 1517)  . cefTRIAXone (ROCEPHIN)  IV Stopped (05/19/17 0131)  . heparin 1,250 Units/hr (05/19/17 1319)   PRN Meds: acetaminophen **OR** acetaminophen, bisacodyl, HYDROcodone-acetaminophen, LORazepam, ondansetron **OR** ondansetron (ZOFRAN) IV, polyethylene glycol, sodium chloride flush  Time spent: 35 minutes  Author: Berle Mull, MD [facility] Pager: [phone] 05/19/2017 6:51 PM  If 7PM-7AM, please contact night-coverage at [email or web address], password [facility]

## 2017-05-20 ENCOUNTER — Inpatient Hospital Stay (HOSPITAL_COMMUNITY): Payer: Medicare Other

## 2017-05-20 DIAGNOSIS — I5041 Acute combined systolic (congestive) and diastolic (congestive) heart failure: Secondary | ICD-10-CM

## 2017-05-20 DIAGNOSIS — K8050 Calculus of bile duct without cholangitis or cholecystitis without obstruction: Secondary | ICD-10-CM

## 2017-05-20 DIAGNOSIS — J189 Pneumonia, unspecified organism: Secondary | ICD-10-CM

## 2017-05-20 LAB — COMPREHENSIVE METABOLIC PANEL
ALT: 25 U/L (ref 17–63)
ANION GAP: 8 (ref 5–15)
AST: 17 U/L (ref 15–41)
Albumin: 2.1 g/dL — ABNORMAL LOW (ref 3.5–5.0)
Alkaline Phosphatase: 125 U/L (ref 38–126)
BILIRUBIN TOTAL: 1.2 mg/dL (ref 0.3–1.2)
BUN: 33 mg/dL — ABNORMAL HIGH (ref 6–20)
CO2: 28 mmol/L (ref 22–32)
Calcium: 8 mg/dL — ABNORMAL LOW (ref 8.9–10.3)
Chloride: 98 mmol/L — ABNORMAL LOW (ref 101–111)
Creatinine, Ser: 1.52 mg/dL — ABNORMAL HIGH (ref 0.61–1.24)
GFR calc Af Amer: 43 mL/min — ABNORMAL LOW (ref >60)
GFR, EST NON AFRICAN AMERICAN: 37 mL/min — AB (ref >60)
Glucose, Bld: 122 mg/dL — ABNORMAL HIGH (ref 65–99)
POTASSIUM: 3.8 mmol/L (ref 3.5–5.1)
Sodium: 134 mmol/L — ABNORMAL LOW (ref 135–145)
TOTAL PROTEIN: 4.5 g/dL — AB (ref 6.5–8.1)

## 2017-05-20 LAB — CBC
HEMATOCRIT: 35.2 % — AB (ref 39.0–52.0)
HEMOGLOBIN: 11.7 g/dL — AB (ref 13.0–17.0)
MCH: 27.6 pg (ref 26.0–34.0)
MCHC: 33.2 g/dL (ref 30.0–36.0)
MCV: 83 fL (ref 78.0–100.0)
Platelets: 301 10*3/uL (ref 150–400)
RBC: 4.24 MIL/uL (ref 4.22–5.81)
RDW: 16.5 % — AB (ref 11.5–15.5)
WBC: 13.6 10*3/uL — AB (ref 4.0–10.5)

## 2017-05-20 LAB — GLUCOSE, CAPILLARY
GLUCOSE-CAPILLARY: 128 mg/dL — AB (ref 65–99)
GLUCOSE-CAPILLARY: 102 mg/dL — AB (ref 65–99)
Glucose-Capillary: 115 mg/dL — ABNORMAL HIGH (ref 65–99)
Glucose-Capillary: 143 mg/dL — ABNORMAL HIGH (ref 65–99)

## 2017-05-20 LAB — HEPARIN LEVEL (UNFRACTIONATED): HEPARIN UNFRACTIONATED: 0.55 [IU]/mL (ref 0.30–0.70)

## 2017-05-20 MED ORDER — AMIODARONE HCL 200 MG PO TABS
400.0000 mg | ORAL_TABLET | Freq: Two times a day (BID) | ORAL | Status: DC
  Administered 2017-05-20 – 2017-05-21 (×3): 400 mg via ORAL
  Filled 2017-05-20 (×3): qty 2

## 2017-05-20 MED ORDER — APIXABAN 2.5 MG PO TABS
2.5000 mg | ORAL_TABLET | Freq: Two times a day (BID) | ORAL | Status: DC
  Administered 2017-05-20 – 2017-05-21 (×3): 2.5 mg via ORAL
  Filled 2017-05-20 (×3): qty 1

## 2017-05-20 MED ORDER — CEFPODOXIME PROXETIL 200 MG PO TABS
200.0000 mg | ORAL_TABLET | Freq: Two times a day (BID) | ORAL | Status: DC
  Administered 2017-05-20 – 2017-05-21 (×3): 200 mg via ORAL
  Filled 2017-05-20 (×4): qty 1

## 2017-05-20 NOTE — Progress Notes (Signed)
[facility] Gastroenterology Progress Note  Bruce Green 83 y.o. 05/15/1919  CC:  Choledocholithiasis   Subjective: Patient feeling much better today. He is status post cardioversion yesterday. Denied abdominal pain, nausea or vomiting.  ROS : . Negative for chest pain and shortness of breath   Objective: Vital signs in last 24 hours: Vitals:   05/20/17 1128 05/20/17 1226  BP: 92/67 (!) 102/55  Pulse: 70   Resp: (!) 24   Temp: 98 F (36.7 C)     Physical Exam:  General:  Alert and oriented 3. Cooperative. Not in acute distress.         Lungs:   Bilateral crackles in the lower aspect. Not in apparent distress . Anterior exam only   Heart:  Irregularly irregular heart rate with tachycardia   Abdomen:   Soft, non-tender, bowel sounds active all four quadrants,  no masses, No peritoneal signs   Extremities: Extremities normal, atraumatic, no  edema       Lab Results:  Recent Labs  05/19/17 0417 05/19/17 0954 05/20/17 0542  NA 131* 132* 134*  K 2.8* 3.1* 3.8  CL 97* 97* 98*  CO2 25 26 28   GLUCOSE 117* 125* 122*  BUN 37* 36* 33*  CREATININE 1.36* 1.36* 1.52*  CALCIUM 8.0* 8.1* 8.0*  MG 1.8  --   --     Recent Labs  05/19/17 0417 05/20/17 0542  AST 24 17  ALT 33 25  ALKPHOS 143* 125  BILITOT 1.6* 1.2  PROT 4.8* 4.5*  ALBUMIN 2.3* 2.1*    Recent Labs  05/19/17 0417 05/20/17 0542  WBC 11.9* 13.6*  HGB 12.4* 11.7*  HCT 36.6* 35.2*  MCV 81.3 83.0  PLT 292 301    Recent Labs  05/19/17 0954  LABPROT 15.8*  INR 1.25      Assessment/Plan: - Choledocholithiasis with the multiple stones within the CBD as well as a dilated CBD up to 17 mm - Abnormal LFTs. Resolved. LFTs normal now. - A. fib with RVR. Status post cardioversion. - SOB  probably from community acquired pneumonia. Improving  Recommendations ----------------------- - Patient is status post cardioversion yesterday. He is being switched to oral anticoagulation with Eliquis.  - His  LFTs are normal now. No abdominal pain. No signs of ascending cholangitis. - Follow-up with Dr. Paulita Fujita in  2-4 weeks after discharge to arrange for elective outpatient ERCP for CBD stones removal . ERCP needs to be done after holding anticoagulation for 2-3 days. - GI will sign off. Call us back if needed  Otis Brace MD, [place] 05/20/2017, 1:14 PM  Pager [phone]  If no answer or after 5 PM call [phone]

## 2017-05-20 NOTE — Anesthesia Postprocedure Evaluation (Signed)
Anesthesia Post Note  Patient: Bruce Green  Procedure(s) Performed: Procedure(s) (LRB): TRANSESOPHAGEAL ECHOCARDIOGRAM (TEE) (N/A) CARDIOVERSION (N/A)     Patient location during evaluation: Endoscopy Anesthesia Type: MAC Level of consciousness: awake, awake and alert and oriented Pain management: pain level controlled Vital Signs Assessment: post-procedure vital signs reviewed and stable Respiratory status: spontaneous breathing, nonlabored ventilation and respiratory function stable Cardiovascular status: blood pressure returned to baseline Anesthetic complications: no    Last Vitals:  Vitals:   05/20/17 0350 05/20/17 0500  BP: 108/62 102/67  Pulse: 69 (!) 41  Resp: (!) 22 18  Temp:  36.6 C    Last Pain:  Vitals:   05/20/17 0500  TempSrc: Oral  PainSc:                  [name] COKER

## 2017-05-20 NOTE — Progress Notes (Signed)
[facility] PROGRESS NOTE  Bruce Green VQM:[id number] DOB: 1918-12-13 DOA: 05/13/2017  PCP: Kathyrn Lass, MD  Brief History/Interval Summary: 83 year old Caucasian male with a past medical history of hypertension, coronary artery disease, GERD, recent pneumonia, presented with shortness of breath and abnormal LFTs. Patient was seen by gastroenterology and underwent MRCP which suggested a CBD stone.  Reason for Visit: Abnormal LFTs. Pneumonia, atrial fibrillation with RVR.  Consultants: Cardiology. Gastroenterology  Procedures:   Transthoracic echocardiogram Study Conclusions  - Left ventricle: EF hard to judge due to rapid afib. Inferior and   septal akinesis The cavity size was moderately dilated. Wall   thickness was normal. Systolic function was severely reduced. The   estimated ejection fraction was in the range of 25% to 30%. The   study is not technically sufficient to allow evaluation of LV   diastolic function. - Aortic valve: There was mild regurgitation. - Mitral valve: There was moderate regurgitation. - Left atrium: The atrium was moderately dilated. - Right atrium: The atrium was mildly dilated. - Atrial septum: No defect or patent foramen ovale was identified. - Tricuspid valve: There was moderate regurgitation. - Pulmonary arteries: PA peak pressure: 31 mm Hg (S).  TEE Impression:  1. EF 25-30% with mildly dilated LV.  2. Mildly dilated RV with moderately decreased systolic function.  3. Severe, probably functional MR.  4. No LA appendage thrombus, proceed with DCCV.   DC cardioversion 6/26  Antibiotics: Completed a course of azithromycin. Ceftriaxone to be changed to [place] today.  Subjective/Interval History: Patient states that he is feeling better this morning. His daughter thinks that he appears to be more short of breath today compared to yesterday. He denies any chest pain. No cough. No nausea, vomiting  ROS: Denies  headaches  Objective:  Vital Signs  Vitals:   05/20/17 0240 05/20/17 0350 05/20/17 0500 05/20/17 0810  BP: 96/67 108/62 102/67 (!) 105/57  Pulse: 71 69 (!) 41 76  Resp: 20 (!) 22 18   Temp:   97.9 F (36.6 C)   TempSrc:   Oral   SpO2: 96% 95% 98%   Weight:   71.7 kg (158 lb 1.6 oz)   Height:        Intake/Output Summary (Last 24 hours) at 05/20/17 1127 Last data filed at 05/20/17 0600  Gross per 24 hour  Intake          1073.92 ml  Output              900 ml  Net           173.92 ml   Filed Weights   05/18/17 0612 05/19/17 0429 05/20/17 0500  Weight: 68.9 kg (151 lb 14.4 oz) 72.7 kg (160 lb 4.8 oz) 71.7 kg (158 lb 1.6 oz)    General appearance: alert, cooperative, appears stated age and no distress Resp: Crackles heard bilateral bases. Some dullness to percussion. No wheezing. Mild tachypnea noted. Cardio: regular rate and rhythm, S1, S2 normal, no murmur, click, rub or gallop GI: soft, non-tender; bowel sounds normal; no masses,  no organomegaly Extremities: extremities normal, atraumatic, no cyanosis or edema Pulses: 2+ and symmetric Neurologic: Awake and alert. Oriented 3. No focal neurological deficits.  Lab Results:  Data Reviewed: I have personally reviewed following labs and imaging studies  CBC:  Recent Labs Lab 05/14/17 0629 05/15/17 0506 05/16/17 0333 05/18/17 0208 05/19/17 0417 05/20/17 0542  WBC 24.0* 17.3* 15.5* 11.6* 11.9* 13.6*  NEUTROABS 22.6* 16.2* 14.0*  --   --   --  HGB 14.5 13.2 12.2* 12.3* 12.4* 11.7*  HCT 43.8 40.1 37.3* 36.9* 36.6* 35.2*  MCV 85.4 84.2 83.4 82.6 81.3 83.0  PLT 168 163 180 223 292 846    Basic Metabolic Panel:  Recent Labs Lab 05/15/17 0506  05/17/17 0248 05/18/17 0208 05/19/17 0417 05/19/17 0954 05/20/17 0542  NA 136  < > 136 134* 131* 132* 134*  K 4.0  < > 4.3 3.3* 2.8* 3.1* 3.8  CL 106  < > 104 100* 97* 97* 98*  CO2 23  < > 23 24 25 26 28   GLUCOSE 131*  < > 154* 132* 117* 125* 122*  BUN 29*  < >  43* 43* 37* 36* 33*  CREATININE 1.19  < > 1.26* 1.36* 1.36* 1.36* 1.52*  CALCIUM 8.4*  < > 8.5* 8.1* 8.0* 8.1* 8.0*  MG 1.9  --   --   --  1.8  --   --   < > = values in this interval not displayed.  GFR: Estimated Creatinine Clearance: 29.5 mL/min (A) (by C-G formula based on SCr of 1.52 mg/dL (H)).  Liver Function Tests:  Recent Labs Lab 05/16/17 0333 05/17/17 0248 05/18/17 0208 05/19/17 0417 05/20/17 0542  AST 24 19 27 24 17   ALT 58 47 40 33 25  ALKPHOS 207* 177* 157* 143* 125  BILITOT 2.1* 1.8* 1.4* 1.6* 1.2  PROT 5.0* 5.1* 4.8* 4.8* 4.5*  ALBUMIN 2.2* 2.3* 2.3* 2.3* 2.1*    Coagulation Profile:  Recent Labs Lab 05/13/17 1942 05/15/17 0506 05/19/17 0954  INR 1.50 1.25 1.25    CBG:  Recent Labs Lab 05/19/17 0740 05/19/17 1136 05/19/17 1619 05/19/17 2057 05/20/17 0805  GLUCAP 125* 106* 119* 141* 115*     Recent Results (from the past 240 hour(s))  Urine culture     Status: None   Collection Time: 05/13/17  2:03 AM  Result Value Ref Range Status   Specimen Description URINE, RANDOM  Final   Special Requests NONE  Final   Culture NO GROWTH  Final   Report Status 05/15/2017 FINAL  Final  Culture, blood (routine x 2)     Status: None   Collection Time: 05/14/17 11:33 AM  Result Value Ref Range Status   Specimen Description BLOOD LEFT HAND  Final   Special Requests IN PEDIATRIC BOTTLE Blood Culture adequate volume  Final   Culture NO GROWTH 5 DAYS  Final   Report Status 05/19/2017 FINAL  Final  Culture, blood (routine x 2)     Status: None   Collection Time: 05/14/17 11:48 AM  Result Value Ref Range Status   Specimen Description BLOOD LEFT ANTECUBITAL  Final   Special Requests   Final    BOTTLES DRAWN AEROBIC AND ANAEROBIC Blood Culture adequate volume   Culture NO GROWTH 5 DAYS  Final   Report Status 05/19/2017 FINAL  Final      Radiology Studies: Dg Chest Port 1 View  Result Date: 05/20/2017 CLINICAL DATA:  Shortness of breath . EXAM:  PORTABLE CHEST 1 VIEW COMPARISON:  05/17/2017 . FINDINGS: Prior CABG. Cardiomegaly with mild bilateral from interstitial prominence and small bilateral pleural effusions. Findings consistent with CHF. Mild pneumonia in the lung bases cannot be excluded. Similar findings noted on prior exam. IMPRESSION: Prior CABG. Cardiomegaly with mild bilateral pulmonary interstitial prominence and bilateral pleural effusions consistent with mild CHF. Mild pneumonia in the lung bases cannot be excluded. Electronically Signed   By: Marcello Moores  Register   On: 05/20/2017 10:03  Medications:  Scheduled: . amiodarone  400 mg Oral BID  . apixaban  2.5 mg Oral BID  . B-complex with vitamin C  1 tablet Oral Daily  . calcium carbonate  1 tablet Oral Daily  . carvedilol  3.125 mg Oral BID WC  . cefpodoxime  200 mg Oral Q12H  . docusate sodium  100 mg Oral Daily  . folic acid  1 mg Oral Daily  . furosemide  40 mg Oral BID  . guaiFENesin  600 mg Oral BID  . insulin aspart  0-9 Units Subcutaneous TID WC  . loratadine  10 mg Oral Daily  . magnesium oxide  200 mg Oral Daily  . pantoprazole  40 mg Oral Q1200  . sodium chloride flush  3 mL Intravenous Q12H  . tamsulosin  0.4 mg Oral QPC supper   Continuous: . sodium chloride     QIW:[id number] **OR** acetaminophen, bisacodyl, HYDROcodone-acetaminophen, LORazepam, ondansetron **OR** ondansetron (ZOFRAN) IV, polyethylene glycol, sodium chloride flush  Assessment/Plan:  Principal Problem:   Gross hematuria Active Problems:   GERD with stricture   CAD (coronary artery disease)   Orthostatic hypotension   CAP (community acquired pneumonia)   Elevated LFTs   Hyperbilirubinemia   Pain in joint of left shoulder   AAA (abdominal aortic aneurysm) without rupture (HCC)    Choledocholithiasis/transaminitis. Patient had an elevated AST, ALT and bilirubin. These have been trending down. MRI did not show any liver mass or pancreatic mass, however, did suggest  choledocholithiasis. Gastroenterology was consulted. They're following. They're waiting for stabilization of patient's cardiorespiratory status. They would defer ERCP until  the patient is able to come off of anticoagulation. If the patient develops signs of cholangitis or recurrent obstruction and patient is on anticoagulation, then ERCP with stent placement without sphincterotomy to be considered. Hepatitis panel negative.  Community-acquired pneumonia. Patient has completed course of azithromycin. Change ceftriaxone to [facility] for a few more days. WBC has been better. Blood cultures without any growth. Chest x-ray repeated today which shows bilateral pleural effusions. No obvious infiltrate is noted.  Atrial fibrillation with RVR/acute on chronic combined systolic and diastolic dysfunction/long QTC due to amiodarone. Cardiology is following. She underwent DC cardioversion on 6/26. Echocardiogram shows EF to be 25%. Patient has been on heparin infusion and amiodarone. Management per cardiology. Patient is on diuretics. His weight today is less than yesterday. However, does not appear to be diuresing much. Continue Lasix. Strict ins and outs, daily weights.  Elevated creatinine Unclear if the patient has chronic kidney disease or not. Mildly elevated creatinine, possibly due to diuretics. Continue to monitor urine output.  Hypokalemia. Corrected.  Left shoulder pain. X-ray did not show any fractures. Thought to be a muscular sprain. Therapy. Pain control.  Bilateral renal cysts Noted on MRI. No further workup needed.  History of gross hematuria. UA in the hospital did not show any hematuria. Could have misconstrued dark urine due to bilirubin in the urine. No further workup is needed.  History of coronary artery disease. Stable.  History of orthostatic hypotension. Midodrine on hold due to atrial fibrillation.  DVT Prophylaxis: Patient was on IV heparin. Plan is to change to oral  anticoagulation    Code Status: Full code  Family Communication: Discussed with the patient and his daughter  Disposition Plan: Patient may need short-term rehabilitation versus home health. Waiting for physical and occupational therapy input.    LOS: 7 days   [facility] Pager [phone] 05/20/2017, 11:27 AM  If  7PM-7AM, please contact night-coverage at [email or web address], password [facility]

## 2017-05-20 NOTE — Progress Notes (Signed)
Gray for Heparin to Eliquis Indication: atrial fibrillation  Allergies  Allergen Reactions  . Sulfa Antibiotics Rash    Patient Measurements: Height: 5\' 10"  (177.8 cm) Weight: 158 lb 1.6 oz (71.7 kg) IBW/kg (Calculated) : 73 Heparin Dosing Weight: 68 kg  Vital Signs: Temp: 97.9 F (36.6 C) (06/27 0500) Temp Source: Oral (06/27 0500) BP: 105/57 (06/27 0810) Pulse Rate: 76 (06/27 0810)  Labs:  Recent Labs  05/18/17 0208 05/19/17 0417 05/19/17 0954 05/20/17 0542  HGB 12.3* 12.4*  --  11.7*  HCT 36.9* 36.6*  --  35.2*  PLT 223 292  --  301  LABPROT  --   --  15.8*  --   INR  --   --  1.25  --   HEPARINUNFRC 0.43 0.45  --  0.55  CREATININE 1.36* 1.36* 1.36* 1.52*    Estimated Creatinine Clearance: 29.5 mL/min (A) (by C-G formula based on SCr of 1.52 mg/dL (H)).   Assessment: 83 y.o. male transitioning to Eliquis for Afib  Goal of Therapy:   Monitor platelets by anticoagulation protocol: Yes   Plan:  DC heparin and heparin labs Eliquis 2.5 mg po BID  Thank you Anette Guarneri, PharmD [phone]   05/20/2017

## 2017-05-20 NOTE — Evaluation (Addendum)
Physical Therapy Evaluation Patient Details Name: Bruce Green MRN: [id number] DOB: 1919-10-02 Today's Date: 05/20/2017   History of Present Illness  Patient is a 83 y/o male who presents with gross hematuria. Found to have elevated LFT's, leukocytosis, early PNA and 3.7 cm AAA noted incidentally on abd [place]. CXR-mild evidence of pneumonia on the right side. MRI suggest small simple parapelvic renal cyst in the left kidneys as well as right kidney. MRCP is suggesting CBD stone. Also with A-fib with RVR s/p cardioversion 6/26. PMH includes TIA, CAD, CKD, ischemic cardiomyopathy.  Clinical Impression  Patient presents with deconditioning, weakness, dyspnea on exertion, decreased endurance, impaired balance and decreased mobility s/p above. Tolerated short distance ambulation with mod A for balance/safety. Eager to get OOB and walk as pt active and independent PTA. Might benefit from RW next session. Pt high fall risk. Limited today by dizziness, fatigue and SOB. Would benefit from [facility] SNF to maximize independence and mobility prior to return home. Will follow acutely. Supine BP 99/65 dizzy Sitting BP 106/58 Sitting BP post ambulation 120/60 with dizziness that took 2 mins to resolve. HR remained in 70s in NSR.    Follow Up Recommendations SNF;Supervision for mobility/OOB;Supervision/Assistance - 24 hour    Equipment Recommendations  None recommended by PT    Recommendations for Other Services       Precautions / Restrictions Precautions Precautions: Fall Restrictions Weight Bearing Restrictions: No      Mobility  Bed Mobility Overal bed mobility: Needs Assistance Bed Mobility: Rolling;Sidelying to Sit Rolling: Supervision Sidelying to sit: Supervision;HOB elevated       General bed mobility comments: Increased time to get to EOB with use of rail. No assist needed. + dizziness.   Transfers Overall transfer level: Needs assistance Equipment used: 1 person hand held  assist Transfers: Sit to/from Stand Sit to Stand: Mod assist         General transfer comment: Assist to power to standing with multiple attempts and use of momentum. Heavy posterior lean initially. + dizziness. See assessment for BP details. Transferred to chair post ambulation.  Ambulation/Gait Ambulation/Gait assistance: Mod assist Ambulation Distance (Feet): 24 Feet (+ 12') Assistive device: 1 person hand held assist Gait Pattern/deviations: Step-through pattern;Decreased stride length;Decreased step length - right;Decreased step length - left;Narrow base of support;Trunk flexed;Staggering right;Staggering left Gait velocity: decreased Gait velocity interpretation: <1.8 ft/sec, indicative of risk for recurrent falls General Gait Details: Slow, unsteady gait with bil knee instability noted with narrow BoS. Mod A for balance and pt reaching for counter for support. 3/4 DOE. Sp02 90% on RA. HR stable.  Stairs            Wheelchair Mobility    Modified Rankin (Stroke Patients Only)       Balance Overall balance assessment: Needs assistance Sitting-balance support: Feet supported;Single extremity supported Sitting balance-Leahy Scale: Fair     Standing balance support: During functional activity Standing balance-Leahy Scale: Poor Standing balance comment: pt with posterior lean in standing and requires Min-Mod A for static balance as well as dynamic standing.                              Pertinent Vitals/Pain Pain Assessment: Faces Faces Pain Scale: Hurts little more Pain Location: bottom Pain Descriptors / Indicators: Sore Pain Intervention(s): Monitored during session;Repositioned    Home Living Family/patient expects to be discharged to:: Skilled nursing facility Living Arrangements: Alone Available Help at Discharge: Family;Available  PRN/intermittently Type of Home: House Home Access: Stairs to enter Entrance Stairs-Rails: Counsellor of Steps: 3 Home Layout: Laundry or work area in basement;Able to live on main level with bedroom/bathroom Home Equipment: Environmental consultant - 2 wheels;Cane - single point      Prior Function Level of Independence: Independent         Comments: Active and still drives.      Hand Dominance        Extremity/Trunk Assessment   Upper Extremity Assessment Upper Extremity Assessment: Defer to OT evaluation    Lower Extremity Assessment Lower Extremity Assessment: Generalized weakness    Cervical / Trunk Assessment Cervical / Trunk Assessment: Kyphotic  Communication   Communication: No difficulties  Cognition Arousal/Alertness: Awake/alert Behavior During Therapy: WFL for tasks assessed/performed Overall Cognitive Status: Within Functional Limits for tasks assessed                                 General Comments: for basic tasks. Difficulty with month. HOH as well.       General Comments General comments (skin integrity, edema, etc.): Daughter present during session. Supine BP 99/65, sitting BP 106/58, Sitting post ambulation 120/60. Dizziness reported which resolved after a few mins.    Exercises     Assessment/Plan    PT Assessment Patient needs continued PT services  PT Problem List Decreased strength;Decreased mobility;Cardiopulmonary status limiting activity;Pain;Decreased balance;Decreased activity tolerance;Decreased cognition       PT Treatment Interventions Therapeutic activities;Gait training;Therapeutic exercise;Patient/family education;Balance training;Functional mobility training;DME instruction    PT Goals (Current goals can be found in the Care Plan section)  Acute Rehab PT Goals Patient Stated Goal: to get stronger and go home PT Goal Formulation: With patient Time For Goal Achievement: 06/03/17 Potential to Achieve Goals: Good    Frequency Min 2X/week   Barriers to discharge Decreased caregiver support lives alone     Co-evaluation               AM-PAC PT "6 Clicks" Daily Activity  Outcome Measure Difficulty turning over in bed (including adjusting bedclothes, sheets and blankets)?: None Difficulty moving from lying on back to sitting on the side of the bed? : None Difficulty sitting down on and standing up from a chair with arms (e.g., wheelchair, bedside commode, etc,.)?: Total Help needed moving to and from a bed to chair (including a wheelchair)?: A Lot Help needed walking in hospital room?: A Lot Help needed climbing 3-5 steps with a railing? : Total 6 Click Score: 14    End of Session Equipment Utilized During Treatment: Gait belt Activity Tolerance: Patient limited by fatigue;Other (comment) (SOB) Patient left: in chair;with call bell/phone within reach;with family/visitor present Nurse Communication: Mobility status PT Visit Diagnosis: Unsteadiness on feet (R26.81);Muscle weakness (generalized) (M62.81);Other abnormalities of gait and mobility (R26.89)    Time: 2831-5176 PT Time Calculation (min) (ACUTE ONLY): 27 min   Charges:   PT Evaluation $PT Eval Moderate Complexity: 1 Procedure PT Treatments $Gait Training: 8-22 mins   PT G Codes:        Wray Kearns, PT, DPT [phone]    Marguarite Arbour A Makenlee Mckeag 05/20/2017, 3:26 PM

## 2017-05-20 NOTE — Progress Notes (Signed)
Progress Note  Patient Name: Bruce Green Date of Encounter: 05/20/2017  Primary Cardiologist:   Dr. Sallyanne Kuster.    Subjective   Feels better today.  Weak.  No pain or SOB  Inpatient Medications    Scheduled Meds: . B-complex with vitamin C  1 tablet Oral Daily  . calcium carbonate  1 tablet Oral Daily  . carvedilol  3.125 mg Oral BID WC  . cefpodoxime  200 mg Oral Q12H  . docusate sodium  100 mg Oral Daily  . folic acid  1 mg Oral Daily  . furosemide  40 mg Oral BID  . guaiFENesin  600 mg Oral BID  . insulin aspart  0-9 Units Subcutaneous TID WC  . loratadine  10 mg Oral Daily  . magnesium oxide  200 mg Oral Daily  . pantoprazole  40 mg Oral Q1200  . sodium chloride flush  3 mL Intravenous Q12H  . tamsulosin  0.4 mg Oral QPC supper   Continuous Infusions: . sodium chloride    . amiodarone 30 mg/hr (05/20/17 0302)  . heparin 1,250 Units/hr (05/20/17 0302)   PRN Meds: acetaminophen **OR** acetaminophen, bisacodyl, HYDROcodone-acetaminophen, LORazepam, ondansetron **OR** ondansetron (ZOFRAN) IV, polyethylene glycol, sodium chloride flush   Vital Signs    Vitals:   05/20/17 0240 05/20/17 0350 05/20/17 0500 05/20/17 0810  BP: 96/67 108/62 102/67 (!) 105/57  Pulse: 71 69 (!) 41 76  Resp: 20 (!) 22 18   Temp:   97.9 F (36.6 C)   TempSrc:   Oral   SpO2: 96% 95% 98%   Weight:   158 lb 1.6 oz (71.7 kg)   Height:        Intake/Output Summary (Last 24 hours) at 05/20/17 0923 Last data filed at 05/20/17 0600  Gross per 24 hour  Intake          1073.92 ml  Output              900 ml  Net           173.92 ml   Filed Weights   05/18/17 0612 05/19/17 0429 05/20/17 0500  Weight: 151 lb 14.4 oz (68.9 kg) 160 lb 4.8 oz (72.7 kg) 158 lb 1.6 oz (71.7 kg)    Telemetry    NSR with PVCs, PACs - Personally Reviewed  ECG    NA - Personally Reviewed  Physical Exam   GEN: No acute distress.   Neck: No  JVD Cardiac: RRR, no murmurs, rubs, or gallops.    Respiratory: Few basilar crackles. GI: Soft, nontender, non-distended  MS: No no edema; No deformity. Neuro:  Nonfocal  Psych: Normal affect   Labs    Chemistry Recent Labs Lab 05/18/17 0208 05/19/17 0417 05/19/17 0954 05/20/17 0542  NA 134* 131* 132* 134*  K 3.3* 2.8* 3.1* 3.8  CL 100* 97* 97* 98*  CO2 24 25 26 28   GLUCOSE 132* 117* 125* 122*  BUN 43* 37* 36* 33*  CREATININE 1.36* 1.36* 1.36* 1.52*  CALCIUM 8.1* 8.0* 8.1* 8.0*  PROT 4.8* 4.8*  --  4.5*  ALBUMIN 2.3* 2.3*  --  2.1*  AST 27 24  --  17  ALT 40 33  --  25  ALKPHOS 157* 143*  --  125  BILITOT 1.4* 1.6*  --  1.2  GFRNONAA 43* 43* 43* 37*  GFRAA 49* 49* 49* 43*  ANIONGAP 10 9 9 8      Hematology Recent Labs Lab 05/18/17 [phone] 05/19/17 0417  05/20/17 0542  WBC 11.6* 11.9* 13.6*  RBC 4.47 4.50 4.24  HGB 12.3* 12.4* 11.7*  HCT 36.9* 36.6* 35.2*  MCV 82.6 81.3 83.0  MCH 27.5 27.6 27.6  MCHC 33.3 33.9 33.2  RDW 16.2* 16.2* 16.5*  PLT 223 292 301    Cardiac EnzymesNo results for input(s): TROPONINI in the last 168 hours. No results for input(s): TROPIPOC in the last 168 hours.   BNP Recent Labs Lab 05/14/17 1133  BNP 850.4*     DDimer No results for input(s): DDIMER in the last 168 hours.   Radiology    No results found.  Cardiac Studies   Procedure: Electrical Cardioversion Indications:  Atrial Fibrillation  Procedure Details Consent: Risks of procedure as well as the alternatives and risks of each were explained to the (patient/caregiver).  Consent for procedure obtained. Time Out: Verified patient identification, verified procedure, site/side was marked, verified correct patient position, special equipment/implants available, medications/allergies/relevent history reviewed, required imaging and test results available.  Performed  Patient placed on cardiac monitor, pulse oximetry, supplemental oxygen as necessary.  Sedation given: Propofol per anesthesiology. Pacer pads placed  anterior and posterior chest.  Cardioverted 1 time(s).  Cardioverted at [place].  Evaluation Findings: Post procedure EKG shows: NSR Complications: None Patient did tolerate procedure well.   Patient Profile     83 y.o. male with a h/o CAD s/p CABG,ischemic DCM w/ EF of 25-30% and orthostatic hypotension, on midodrine. Admitted 6/20/18with LFT abnormalities and retained CBD stone (GI following) as well as ongoing PNA. Cardiology consulted for atrial fibrillation w/ RVR.   Assessment & Plan    ATRIAL FIB WITH RVR:  Cardioversion yesterday.  Will consult pharmacy to transition from heparin to Eliquis.   Change to PO amiodarone.    ISCHEMIC DILATED CM:    Potassium was replaced.  Creat has crept up.  Lasix dose has been decreased.  Need to keep a close eye on the BMET.  BP will not allow med titration at this point.       Signed, Minus Breeding, MD  05/20/2017, 9:23 AM

## 2017-05-20 NOTE — Discharge Instructions (Addendum)
Atrial Fibrillation Atrial fibrillation is a type of heartbeat that is irregular or fast (rapid). If you have this condition, your heart keeps quivering in a weird (chaotic) way. This condition can make it so your heart cannot pump blood normally. Having this condition gives a person more risk for stroke, heart failure, and other heart problems. There are different types of atrial fibrillation. Talk with your doctor to learn about the type that you have. Follow these instructions at home:  Take over-the-counter and prescription medicines only as told by your doctor.  If your doctor prescribed a blood-thinning medicine, take it exactly as told. Taking too much of it can cause bleeding. If you do not take enough of it, you will not have the protection that you need against stroke and other problems.  Do not use any tobacco products. These include cigarettes, chewing tobacco, and e-cigarettes. If you need help quitting, ask your doctor.  If you have apnea (obstructive sleep apnea), manage it as told by your doctor.  Do not drink alcohol.  Do not drink beverages that have caffeine. These include coffee, soda, and tea.  Maintain a healthy weight. Do not use diet pills unless your doctor says they are safe for you. Diet pills may make heart problems worse.  Follow diet instructions as told by your doctor.  Exercise regularly as told by your doctor.  Keep all follow-up visits as told by your doctor. This is important. Contact a doctor if:  You notice a change in the speed, rhythm, or strength of your heartbeat.  You are taking a blood-thinning medicine and you notice more bruising.  You get tired more easily when you move or exercise. Get help right away if:  You have pain in your chest or your belly (abdomen).  You have sweating or weakness.  You feel sick to your stomach (nauseous).  You notice blood in your throw up (vomit), poop (stool), or pee (urine).  You are short of  breath.  You suddenly have swollen feet and ankles.  You feel dizzy.  Your suddenly get weak or numb in your face, arms, or legs, especially if it happens on one side of your body.  You have trouble talking, trouble understanding, or both.  Your face or your eyelid droops on one side. These symptoms may be an emergency. Do not wait to see if the symptoms will go away. Get medical help right away. Call your local emergency services (911 in the [place] Do not drive yourself to the hospital. This information is not intended to replace advice given to you by your health care provider. Make sure you discuss any questions you have with your health care provider. Document Released: 08/19/2008 Document Revised: 04/17/2016 Document Reviewed: 03/07/2015 Elsevier Interactive Patient Education  2018 [place] on my medicine - ELIQUIS (apixaban)  This medication education was reviewed with me or my healthcare representative as part of my discharge preparation.  The pharmacist that spoke with me during my hospital stay was:  Tad Moore, [facility]  Why was Eliquis prescribed for you? Eliquis was prescribed for you to reduce the risk of a blood clot forming that can cause a stroke if you have a medical condition called atrial fibrillation (a type of irregular heartbeat).  What do You need to know about Eliquis ? Take your Eliquis TWICE DAILY - one tablet in the morning and one tablet in the evening with or without food. If you have difficulty swallowing the tablet  whole please discuss with your pharmacist how to take the medication safely.  Take Eliquis exactly as prescribed by your doctor and DO NOT stop taking Eliquis without talking to the doctor who prescribed the medication.  Stopping may increase your risk of developing a stroke.  Refill your prescription before you run out.  After discharge, you should have regular check-up appointments with your healthcare provider that  is prescribing your Eliquis.  In the future your dose may need to be changed if your kidney function or weight changes by a significant amount or as you get older.  What do you do if you miss a dose? If you miss a dose, take it as soon as you remember on the same day and resume taking twice daily.  Do not take more than one dose of ELIQUIS at the same time to make up a missed dose.  Important Safety Information A possible side effect of Eliquis is bleeding. You should call your healthcare provider right away if you experience any of the following: ? Bleeding from an injury or your nose that does not stop. ? Unusual colored urine (red or dark brown) or unusual colored stools (red or black). ? Unusual bruising for unknown reasons. ? A serious fall or if you hit your head (even if there is no bleeding).  Some medicines may interact with Eliquis and might increase your risk of bleeding or clotting while on Eliquis. To help avoid this, consult your healthcare provider or pharmacist prior to using any new prescription or non-prescription medications, including herbals, vitamins, non-steroidal anti-inflammatory drugs (NSAIDs) and supplements.  This website has more information on Eliquis (apixaban): http://www.eliquis.com/eliquis/home

## 2017-05-21 LAB — COMPREHENSIVE METABOLIC PANEL
ALBUMIN: 2.2 g/dL — AB (ref 3.5–5.0)
ALK PHOS: 120 U/L (ref 38–126)
ALT: 22 U/L (ref 17–63)
ANION GAP: 14 (ref 5–15)
AST: 16 U/L (ref 15–41)
BUN: 30 mg/dL — ABNORMAL HIGH (ref 6–20)
CALCIUM: 8 mg/dL — AB (ref 8.9–10.3)
CO2: 23 mmol/L (ref 22–32)
Chloride: 99 mmol/L — ABNORMAL LOW (ref 101–111)
Creatinine, Ser: 1.38 mg/dL — ABNORMAL HIGH (ref 0.61–1.24)
GFR calc non Af Amer: 42 mL/min — ABNORMAL LOW (ref >60)
GFR, EST AFRICAN AMERICAN: 48 mL/min — AB (ref >60)
GLUCOSE: 113 mg/dL — AB (ref 65–99)
POTASSIUM: 2.9 mmol/L — AB (ref 3.5–5.1)
SODIUM: 136 mmol/L (ref 135–145)
Total Bilirubin: 1.7 mg/dL — ABNORMAL HIGH (ref 0.3–1.2)
Total Protein: 4.5 g/dL — ABNORMAL LOW (ref 6.5–8.1)

## 2017-05-21 LAB — GLUCOSE, CAPILLARY
Glucose-Capillary: 103 mg/dL — ABNORMAL HIGH (ref 65–99)
Glucose-Capillary: 148 mg/dL — ABNORMAL HIGH (ref 65–99)

## 2017-05-21 LAB — CBC
HEMATOCRIT: 35.9 % — AB (ref 39.0–52.0)
HEMOGLOBIN: 11.9 g/dL — AB (ref 13.0–17.0)
MCH: 27.5 pg (ref 26.0–34.0)
MCHC: 33.1 g/dL (ref 30.0–36.0)
MCV: 83.1 fL (ref 78.0–100.0)
Platelets: 303 10*3/uL (ref 150–400)
RBC: 4.32 MIL/uL (ref 4.22–5.81)
RDW: 16.4 % — ABNORMAL HIGH (ref 11.5–15.5)
WBC: 12 10*3/uL — ABNORMAL HIGH (ref 4.0–10.5)

## 2017-05-21 LAB — MAGNESIUM: MAGNESIUM: 1.8 mg/dL (ref 1.7–2.4)

## 2017-05-21 MED ORDER — CARVEDILOL 3.125 MG PO TABS: 3.1250 mg | ORAL_TABLET | Freq: Two times a day (BID) | ORAL | Status: DC

## 2017-05-21 MED ORDER — POTASSIUM CHLORIDE CRYS ER 20 MEQ PO TBCR: 40.0000 meq | EXTENDED_RELEASE_TABLET | Freq: Every day | ORAL | 0 refills | Status: DC

## 2017-05-21 MED ORDER — TAMSULOSIN HCL 0.4 MG PO CAPS: 0.4000 mg | ORAL_CAPSULE | Freq: Every day | ORAL | Status: DC

## 2017-05-21 MED ORDER — CEFPODOXIME PROXETIL 200 MG PO TABS: 200.0000 mg | ORAL_TABLET | Freq: Two times a day (BID) | ORAL | Status: AC

## 2017-05-21 MED ORDER — AMIODARONE HCL 400 MG PO TABS: 400.0000 mg | ORAL_TABLET | Freq: Two times a day (BID) | ORAL | Status: DC

## 2017-05-21 MED ORDER — MAGNESIUM OXIDE 400 (241.3 MG) MG PO TABS: 200.0000 mg | ORAL_TABLET | Freq: Two times a day (BID) | ORAL | Status: DC

## 2017-05-21 MED ORDER — POTASSIUM CHLORIDE CRYS ER 20 MEQ PO TBCR
20.0000 meq | EXTENDED_RELEASE_TABLET | Freq: Once | ORAL | Status: AC
  Administered 2017-05-21: 20 meq via ORAL
  Filled 2017-05-21: qty 1

## 2017-05-21 MED ORDER — APIXABAN 2.5 MG PO TABS: 2.5000 mg | ORAL_TABLET | Freq: Two times a day (BID) | ORAL | Status: DC

## 2017-05-21 MED ORDER — POTASSIUM CHLORIDE CRYS ER 20 MEQ PO TBCR
40.0000 meq | EXTENDED_RELEASE_TABLET | Freq: Once | ORAL | Status: AC
  Administered 2017-05-21: 40 meq via ORAL
  Filled 2017-05-21: qty 2

## 2017-05-21 MED ORDER — POLYETHYLENE GLYCOL 3350 17 G PO PACK: 17.0000 g | PACK | Freq: Every day | ORAL | 0 refills | Status: AC | PRN

## 2017-05-21 MED ORDER — FUROSEMIDE 40 MG PO TABS: 40.0000 mg | ORAL_TABLET | Freq: Two times a day (BID) | ORAL | Status: DC

## 2017-05-21 NOTE — Care Management Important Message (Signed)
Important Message  Patient Details  Name: Bruce Green MRN: [id number] Date of Birth: 02-19-1919   Medicare Important Message Given:  Yes    Natacha Jepsen Abena 05/21/2017, 10:08 AM

## 2017-05-21 NOTE — Progress Notes (Signed)
Progress Note  Patient Name: Bruce Green Date of Encounter: 05/21/2017  Primary Cardiologist:   Dr. Sallyanne Kuster  Subjective   Ambulated in the hallway today.  He denies pain or SOB.   Inpatient Medications    Scheduled Meds: . amiodarone  400 mg Oral BID  . apixaban  2.5 mg Oral BID  . B-complex with vitamin C  1 tablet Oral Daily  . calcium carbonate  1 tablet Oral Daily  . carvedilol  3.125 mg Oral BID WC  . cefpodoxime  200 mg Oral Q12H  . docusate sodium  100 mg Oral Daily  . folic acid  1 mg Oral Daily  . furosemide  40 mg Oral BID  . guaiFENesin  600 mg Oral BID  . insulin aspart  0-9 Units Subcutaneous TID WC  . loratadine  10 mg Oral Daily  . magnesium oxide  200 mg Oral Daily  . pantoprazole  40 mg Oral Q1200  . potassium chloride  20 mEq Oral Once  . sodium chloride flush  3 mL Intravenous Q12H  . tamsulosin  0.4 mg Oral QPC supper   Continuous Infusions: . sodium chloride     PRN Meds: acetaminophen **OR** acetaminophen, bisacodyl, HYDROcodone-acetaminophen, LORazepam, ondansetron **OR** ondansetron (ZOFRAN) IV, polyethylene glycol, sodium chloride flush   Vital Signs    Vitals:   05/20/17 1714 05/20/17 2122 05/21/17 0450 05/21/17 0941  BP: (!) 112/58 107/64 118/66   Pulse: 68 74 72 96  Resp:  20 18   Temp:  98.3 F (36.8 C) 98 F (36.7 C)   TempSrc:  Oral Oral   SpO2:   92%   Weight:   153 lb 9.6 oz (69.7 kg)   Height:        Intake/Output Summary (Last 24 hours) at 05/21/17 1051 Last data filed at 05/21/17 0540  Gross per 24 hour  Intake              145 ml  Output             2200 ml  Net            -2055 ml   Filed Weights   05/19/17 0429 05/20/17 0500 05/21/17 0450  Weight: 160 lb 4.8 oz (72.7 kg) 158 lb 1.6 oz (71.7 kg) 153 lb 9.6 oz (69.7 kg)    Telemetry    NSR, PACs - Personally Reviewed  ECG    NA - Personally Reviewed  Physical Exam   GEN: No acute distress.   Neck: No  JVD Cardiac: RRR, no murmurs, rubs, or  gallops.  Respiratory: Clear  to auscultation bilaterally. GI: Soft, nontender, non-distended  MS: No  edema; No deformity. Neuro:  Nonfocal  Psych: Normal affect   Labs    Chemistry Recent Labs Lab 05/19/17 0417 05/19/17 0954 05/20/17 0542 05/21/17 0458  NA 131* 132* 134* 136  K 2.8* 3.1* 3.8 2.9*  CL 97* 97* 98* 99*  CO2 25 26 28 23   GLUCOSE 117* 125* 122* 113*  BUN 37* 36* 33* 30*  CREATININE 1.36* 1.36* 1.52* 1.38*  CALCIUM 8.0* 8.1* 8.0* 8.0*  PROT 4.8*  --  4.5* 4.5*  ALBUMIN 2.3*  --  2.1* 2.2*  AST 24  --  17 16  ALT 33  --  25 22  ALKPHOS 143*  --  125 120  BILITOT 1.6*  --  1.2 1.7*  GFRNONAA 43* 43* 37* 42*  GFRAA 49* 49* 43* 48*  ANIONGAP 9  9 8 14      Hematology Recent Labs Lab 05/19/17 0417 05/20/17 0542 05/21/17 0458  WBC 11.9* 13.6* 12.0*  RBC 4.50 4.24 4.32  HGB 12.4* 11.7* 11.9*  HCT 36.6* 35.2* 35.9*  MCV 81.3 83.0 83.1  MCH 27.6 27.6 27.5  MCHC 33.9 33.2 33.1  RDW 16.2* 16.5* 16.4*  PLT 292 301 303    Cardiac EnzymesNo results for input(s): TROPONINI in the last 168 hours. No results for input(s): TROPIPOC in the last 168 hours.   BNP Recent Labs Lab 05/14/17 1133  BNP 850.4*     DDimer No results for input(s): DDIMER in the last 168 hours.   Radiology    Dg Chest Port 1 View  Result Date: 05/20/2017 CLINICAL DATA:  Shortness of breath . EXAM: PORTABLE CHEST 1 VIEW COMPARISON:  05/17/2017 . FINDINGS: Prior CABG. Cardiomegaly with mild bilateral from interstitial prominence and small bilateral pleural effusions. Findings consistent with CHF. Mild pneumonia in the lung bases cannot be excluded. Similar findings noted on prior exam. IMPRESSION: Prior CABG. Cardiomegaly with mild bilateral pulmonary interstitial prominence and bilateral pleural effusions consistent with mild CHF. Mild pneumonia in the lung bases cannot be excluded. Electronically Signed   By: Marcello Moores  Register   On: 05/20/2017 10:03    Cardiac Studies   Procedure:  Electrical Cardioversion Indications:Atrial Fibrillation  Procedure Details Consent: Risks of procedure as well as the alternatives and risks of each were explained to the (patient/caregiver). Consent for procedure obtained. Time RAQ:[id number] patient identification, verified procedure, site/side was marked, verified correct patient position, special equipment/implants available, medications/allergies/relevent history reviewed, required imaging and test results available. Performed  Patient placed on cardiac monitor, pulse oximetry, supplemental oxygen as necessary.  Sedation given: Propofol per anesthesiology. Pacer pads placed anterior and posterior chest.  Cardioverted 1time(s).  Cardioverted at [place].  Evaluation Findings: Post procedure EKG shows: NSR Complications: None Patient didtolerate procedure well.  Patient Profile     83 y.o. male with a h/o CAD s/p CABG,ischemic DCM w/ EF of 25-30% and orthostatic hypotension, on midodrine. Admitted 6/20/18with LFT abnormalities and retained CBD stone (GI following) as well as ongoing PNA. Cardiology consulted for atrial fibrillation w/ RVR.   Assessment & Plan    ATRIAL FIB:  Now on PO amio and Eliquis.  Maintaining NSR.  ISCHEMIC CM:   Seems to be euvolemic.  I will continue current therapy.    HYPOKALEMIA:  He received 40 meq KDur this morning and is scheduled for another 20.  I will add another 40 meq for a total of 100 meq today.  Follow BMET in AM.    Signed, Minus Breeding, MD  05/21/2017, 10:51 AM

## 2017-05-21 NOTE — Progress Notes (Signed)
Physical Therapy Treatment Patient Details Name: Bruce Green MRN: [id number] DOB: August 14, 1919 Today's Date: 05/21/2017    History of Present Illness Patient is a 83 y/o male who presents with gross hematuria. Found to have elevated LFT's, leukocytosis, early PNA and 3.7 cm AAA noted incidentally on abd [place]. CXR-mild evidence of pneumonia on the right side. MRI suggest small simple parapelvic renal cyst in the left kidneys as well as right kidney. MRCP is suggesting CBD stone. Also with A-fib with RVR s/p cardioversion 6/26. PMH includes TIA, CAD, CKD, ischemic cardiomyopathy.    PT Comments    Patient progressing well towards PT goals. Eager to get up and ambulate. Improved ambulation distance with use of RW and min A for balance/safety. Cues to decrease speed and for rest breaks. Sp02 dropped to mid 80s on RA, recovered with rest. Fatigues easily but highly motivated to improve strength/mobility. Encouraged OOB to chair and ambulate to bathroom with RN. Will follow.   Follow Up Recommendations  SNF;Supervision for mobility/OOB;Supervision/Assistance - 24 hour     Equipment Recommendations  None recommended by PT    Recommendations for Other Services       Precautions / Restrictions Precautions Precautions: Fall Precaution Comments: watch 02 Restrictions Weight Bearing Restrictions: No    Mobility  Bed Mobility Overal bed mobility: Needs Assistance Bed Mobility: Rolling;Sidelying to Sit Rolling: Supervision Sidelying to sit: Min assist;HOB elevated       General bed mobility comments: Cues for sequencing and technique to get to EOB; use of rail for support and Min A for trunk. No dizziness.   Transfers Overall transfer level: Needs assistance Equipment used: Rolling walker (2 wheeled) Transfers: Sit to/from Stand Sit to Stand: Min assist;Min guard         General transfer comment: Assist to power to standing with cues for hand placement/technique. Stood from [employer], from chair x2. Dizziness on/off. transferred to chair post ambulation.  Ambulation/Gait Ambulation/Gait assistance: Min assist Ambulation Distance (Feet): 100 Feet (+50') Assistive device: Rolling walker (2 wheeled) Gait Pattern/deviations: Step-through pattern;Decreased stride length;Trunk flexed;Drifts right/left Gait velocity: too fast- unsafe at times   General Gait Details: Initially with too fast gait speed with pt not clearing bil feet very well; cues to slow down and for upright posture. 2/4 DOE. Sp02 dropped to mid 80s on RA. Cues for pursed ;lip breathing. HR stable. 1 longer seated rest break.   Stairs            Wheelchair Mobility    Modified Rankin (Stroke Patients Only)       Balance Overall balance assessment: Needs assistance Sitting-balance support: Feet supported;Single extremity supported Sitting balance-Leahy Scale: Fair     Standing balance support: During functional activity Standing balance-Leahy Scale: Poor Standing balance comment: Reliant on BUEs for support in standing. Convinced to use RW.                             Cognition Arousal/Alertness: Awake/alert Behavior During Therapy: WFL for tasks assessed/performed Overall Cognitive Status: Within Functional Limits for tasks assessed                                        Exercises      General Comments General comments (skin integrity, edema, etc.): Daughter present during session, provided chair follow. BP 117/85.  Pertinent Vitals/Pain Pain Assessment: No/denies pain    Home Living                      Prior Function            PT Goals (current goals can now be found in the care plan section) Progress towards PT goals: Progressing toward goals    Frequency    Min 2X/week      PT Plan Current plan remains appropriate    Co-evaluation              AM-PAC PT "6 Clicks" Daily Activity  Outcome Measure  Difficulty  turning over in bed (including adjusting bedclothes, sheets and blankets)?: None Difficulty moving from lying on back to sitting on the side of the bed? : Total Difficulty sitting down on and standing up from a chair with arms (e.g., wheelchair, bedside commode, etc,.)?: Total Help needed moving to and from a bed to chair (including a wheelchair)?: A Little Help needed walking in hospital room?: A Little Help needed climbing 3-5 steps with a railing? : A Lot 6 Click Score: 14    End of Session Equipment Utilized During Treatment: Gait belt Activity Tolerance: Treatment limited secondary to medical complications (Comment) (drop in Sp02) Patient left: in chair;with call bell/phone within reach;with family/visitor present Nurse Communication: Mobility status PT Visit Diagnosis: Unsteadiness on feet (R26.81);Muscle weakness (generalized) (M62.81);Other abnormalities of gait and mobility (R26.89)     Time: 1438-8875 PT Time Calculation (min) (ACUTE ONLY): 26 min  Charges:  $Gait Training: 23-37 mins                    G Codes:       Wray Kearns, PT, DPT [phone]     Marguarite Arbour A Kaysea Raya 05/21/2017, 10:28 AM

## 2017-05-21 NOTE — NC FL2 (Signed)
[place] LEVEL OF CARE SCREENING TOOL     IDENTIFICATION  Patient Name: Bruce Green Birthdate: Aug 14, 1919 Sex: male Admission Date (Current Location): 05/13/2017  [facility] [place] and [place] Number:  Herbalist and Address:  [facility], [place] [address] [place], [place] [address]      Provider Number: [id number]  Attending Physician Name and Address:  Bonnielee Haff, MD  Relative Name and Phone Number:  [name], Daughter, [phone]     Current Level of Care: Hospital Recommended Level of Care: [place] Prior Approval Number:    Date Approved/Denied:   PASRR Number: [id number]  Discharge Plan: SNF    Current Diagnoses: Patient Active Problem List   Diagnosis Date Noted  . AAA (abdominal aortic aneurysm) without rupture ([place]) 05/14/2017  . CAP (community acquired pneumonia) 05/13/2017  . Gross hematuria 05/13/2017  . Elevated LFTs 05/13/2017  . Hyperbilirubinemia 05/13/2017  . Pain in joint of left shoulder 05/13/2017  . S/P CABG (coronary artery bypass graft) 06/08/2014  . Orthostatic hypotension 06/08/2014  . SOB (shortness of breath) 05/17/2014  . Eye infection 05/17/2014  . CAD (coronary artery disease) 04/28/2014  . Cardiomyopathy, ischemic 04/25/2014  . S/P CABG x 3 04/24/2014  . Cardiomyopathy of undetermined type ([place]) 04/19/2014  . Systolic CHF with reduced left ventricular function, NYHA class 2 ([place]) 04/19/2014  . Dyspnea on exertion 04/10/2014  . Murmur 04/10/2014  . Cardiomegaly 04/10/2014  . Coronary artery calcification seen on CAT scan 04/10/2014  . GERD with stricture 04/09/2012    Orientation RESPIRATION BLADDER Height & Weight     Self, Situation, Place  O2 (nasal cannula 2L) Incontinent Weight: 153 lb 9.6 oz (69.7 kg) Height:  5\' 10"  (177.8 cm)  BEHAVIORAL SYMPTOMS/MOOD NEUROLOGICAL BOWEL NUTRITION STATUS      Continent Diet (heart healthy/carb modified)  AMBULATORY  STATUS COMMUNICATION OF NEEDS Skin   Limited Assist Verbally Normal                       Personal Care Assistance Level of Assistance  Dressing, Feeding, Bathing Bathing Assistance: Limited assistance Feeding assistance: Independent Dressing Assistance: Limited assistance     Functional Limitations Info  Sight, Hearing, Speech Sight Info: Adequate Hearing Info: Adequate Speech Info: Adequate    SPECIAL CARE FACTORS FREQUENCY  PT (By licensed PT)     PT Frequency: 5x/week              Contractures      Additional Factors Info  Code Status, Allergies Code Status Info: Full Allergies Info: sulfa antibiotics           Current Medications (05/21/2017):  This is the current hospital active medication list Current Facility-Administered Medications  Medication Dose Route Frequency Provider Last Rate Last Dose  . 0.9 %  sodium chloride infusion  250 mL Intravenous Continuous Simmons, Brittainy M, PA-C      . acetaminophen (TYLENOL) tablet 650 mg  650 mg Oral Q6H PRN Opyd, Ilene Qua, MD       Or  . acetaminophen (TYLENOL) suppository 650 mg  650 mg Rectal Q6H PRN Opyd, Ilene Qua, MD      . amiodarone (PACERONE) tablet 400 mg  400 mg Oral BID Minus Breeding, MD   400 mg at 05/21/17 0943  . apixaban (ELIQUIS) tablet 2.5 mg  2.5 mg Oral BID Minus Breeding, MD   2.5 mg at 05/21/17 0942  . B-complex with vitamin  C tablet 1 tablet  1 tablet Oral Daily Opyd, Ilene Qua, MD   1 tablet at 05/21/17 0940  . bisacodyl (DULCOLAX) EC tablet 5 mg  5 mg Oral Daily PRN Opyd, Ilene Qua, MD      . calcium carbonate (OS-CAL - dosed in mg of elemental calcium) tablet 500 mg of elemental calcium  1 tablet Oral Daily Opyd, Ilene Qua, MD   500 mg of elemental calcium at 05/21/17 0942  . carvedilol (COREG) tablet 3.125 mg  3.125 mg Oral BID WC Lavina Hamman, MD   3.125 mg at 05/21/17 0941  . cefpodoxime (VANTIN) tablet 200 mg  200 mg Oral Q12H Bonnielee Haff, MD   200 mg at 05/21/17 0941   . docusate sodium (COLACE) capsule 100 mg  100 mg Oral Daily Opyd, Ilene Qua, MD   100 mg at 05/21/17 0941  . folic acid (FOLVITE) tablet 1 mg  1 mg Oral Daily Opyd, Ilene Qua, MD   1 mg at 05/21/17 0943  . furosemide (LASIX) tablet 40 mg  40 mg Oral BID Minus Breeding, MD   40 mg at 05/21/17 0940  . guaiFENesin (MUCINEX) 12 hr tablet 600 mg  600 mg Oral BID Lavina Hamman, MD   600 mg at 05/21/17 0941  . HYDROcodone-acetaminophen (NORCO/VICODIN) 5-325 MG per tablet 1-2 tablet  1-2 tablet Oral Q4H PRN Opyd, Ilene Qua, MD   1 tablet at 05/14/17 0203  . insulin aspart (novoLOG) injection 0-9 Units  0-9 Units Subcutaneous TID WC Opyd, Ilene Qua, MD   1 Units at 05/20/17 1200  . loratadine (CLARITIN) tablet 10 mg  10 mg Oral Daily Opyd, Ilene Qua, MD   10 mg at 05/21/17 0942  . LORazepam (ATIVAN) tablet 0.5 mg  0.5 mg Oral Q6H PRN Opyd, Ilene Qua, MD   0.5 mg at 05/17/17 2126  . magnesium oxide (MAG-OX) tablet 200 mg  200 mg Oral Daily Opyd, Ilene Qua, MD   200 mg at 05/21/17 0942  . ondansetron (ZOFRAN) tablet 4 mg  4 mg Oral Q6H PRN Opyd, Ilene Qua, MD       Or  . ondansetron (ZOFRAN) injection 4 mg  4 mg Intravenous Q6H PRN Opyd, Ilene Qua, MD      . pantoprazole (PROTONIX) EC tablet 40 mg  40 mg Oral Q1200 Opyd, Ilene Qua, MD   40 mg at 05/20/17 1200  . polyethylene glycol (MIRALAX / GLYCOLAX) packet 17 g  17 g Oral Daily PRN Opyd, Ilene Qua, MD      . potassium chloride SA (K-DUR,KLOR-CON) CR tablet 20 mEq  20 mEq Oral Once Bonnielee Haff, MD      . sodium chloride flush (NS) 0.9 % injection 3 mL  3 mL Intravenous Q12H Simmons, Brittainy M, PA-C   3 mL at 05/21/17 1000  . sodium chloride flush (NS) 0.9 % injection 3 mL  3 mL Intravenous PRN Lyda Jester M, PA-C      . tamsulosin (FLOMAX) capsule 0.4 mg  0.4 mg Oral QPC supper Lavina Hamman, MD   0.4 mg at 05/20/17 1714     Discharge Medications: Current Discharge Medication List        START taking these medications    Details  amiodarone (PACERONE) 400 MG tablet Take 1 tablet (400 mg total) by mouth 2 (two) times daily.    apixaban (ELIQUIS) 2.5 MG TABS tablet Take 1 tablet (2.5 mg total) by mouth 2 (two) times daily. Qty: 60  tablet    carvedilol (COREG) 3.125 MG tablet Take 1 tablet (3.125 mg total) by mouth 2 (two) times daily with a meal.    cefpodoxime (VANTIN) 200 MG tablet Take 1 tablet (200 mg total) by mouth every 12 (twelve) hours. For 2 more days    furosemide (LASIX) 40 MG tablet Take 1 tablet (40 mg total) by mouth 2 (two) times daily. Qty: 30 tablet    polyethylene glycol (MIRALAX / GLYCOLAX) packet Take 17 g by mouth daily as needed for mild constipation. Qty: 14 each, Refills: 0    potassium chloride SA (K-DUR,KLOR-CON) 20 MEQ tablet Take 2 tablets (40 mEq total) by mouth daily. Starting tonight (6/28) Qty: 1 tablet, Refills: 0    tamsulosin (FLOMAX) 0.4 MG CAPS capsule Take 1 capsule (0.4 mg total) by mouth daily after supper. Qty: 30 capsule          CONTINUE these medications which have NOT CHANGED   Details  atorvastatin (LIPITOR) 80 MG tablet Take 40 mg by mouth daily.    B Complex-C (B-COMPLEX WITH VITAMIN C) tablet Take 1 tablet by mouth daily.    Cholecalciferol (VITAMIN D3) 5000 units TABS Take 1 tablet by mouth every other day.    docusate sodium (COLACE) 100 MG capsule Take 100 mg by mouth daily.    folic acid (FOLVITE) 1 MG tablet Take 400 mcg by mouth daily.     GUAIFENESIN 1200 PO Take 1,200 mg by mouth.    loratadine (CLARITIN) 10 MG tablet Take 1 tablet by mouth daily.    Magnesium 400 MG CAPS Take 400 mg by mouth daily.    Multiple Vitamins-Minerals (MULTIVITAMIN WITH MINERALS) tablet Take 1 tablet by mouth daily.    pantoprazole (PROTONIX) 40 MG tablet TAKE 1 TABLET (40 MG TOTAL) BY MOUTH DAILY BEFORE BREAKFAST. Qty: 90 tablet, Refills: 0    Probiotic Product (PROBIOTIC PO) Take 1 tablet by mouth daily.    acetaminophen  (TYLENOL) 325 MG tablet Take 650 mg by mouth every 6 (six) hours as needed for moderate pain.    Calcium-Magnesium 500-250 MG TABS Take 1 tablet by mouth daily.         STOP taking these medications     aspirin 81 MG tablet      azithromycin (ZITHROMAX) 250 MG tablet      Glucosamine-Chondroit-Vit C-Mn (GLUCOSAMINE 1500 COMPLEX PO)      isosorbide mononitrate (ISMO,MONOKET) 10 MG tablet      midodrine (PROAMATINE) 2.5 MG tablet       Relevant Imaging Results:  Relevant Lab Results:   Additional Information SSN: [id number]  Estanislado Emms, LCSW

## 2017-05-21 NOTE — Evaluation (Signed)
Occupational Therapy Evaluation Patient Details Name: Bruce Green MRN: [id number] DOB: 24-Apr-1919 Today's Date: 05/21/2017    History of Present Illness Patient is a 83 y/o male who presents with gross hematuria. Found to have elevated LFT's, leukocytosis, early PNA and 3.7 cm AAA noted incidentally on abd [place]. CXR-mild evidence of pneumonia on the right side. MRI suggest small simple parapelvic renal cyst in the left kidneys as well as right kidney. MRCP is suggesting CBD stone. Also with A-fib with RVR s/p cardioversion 6/26. PMH includes TIA, CAD, CKD, ischemic cardiomyopathy.   Clinical Impression   Pt was independent in self care, but dependent in all IADL prior to admission. Daughter checked on him multiple times during the day and reports pt has baseline memory deficits and poor awareness of deficits and safety. Pt currently requires min assist for mobility and ADL. Recommending ST rehab in SNF. Will follow.   Follow Up Recommendations  SNF;Supervision/Assistance - 24 hour    Equipment Recommendations       Recommendations for Other Services       Precautions / Restrictions Precautions Precautions: Fall Precaution Comments: watch 02 Restrictions Weight Bearing Restrictions: No      Mobility Bed Mobility Overal bed mobility: Needs Assistance Bed Mobility: Rolling;Sidelying to Sit;Sit to Sidelying Rolling: Supervision Sidelying to sit: Min assist;HOB elevated     Sit to sidelying: Min assist General bed mobility comments: Cues for sequencing and technique to get to EOB; use of rail for support and Min A for trunk. No dizziness.   Transfers Overall transfer level: Needs assistance Equipment used: Rolling walker (2 wheeled) Transfers: Sit to/from Stand Sit to Stand: Min assist         General transfer comment: cues for hand placement, assist to stand and steady    Balance Overall balance assessment: Needs assistance Sitting-balance support: Feet  supported;Single extremity supported Sitting balance-Leahy Scale: Fair     Standing balance support: During functional activity Standing balance-Leahy Scale: Poor Standing balance comment: can release walker in static standing, dependent on B UE support to walk                           ADL either performed or assessed with clinical judgement   ADL Overall ADL's : Needs assistance/impaired Eating/Feeding: Independent;Sitting   Grooming: Wash/dry hands;Standing;Minimal assistance   Upper Body Bathing: Minimal assistance;Sitting   Lower Body Bathing: Minimal assistance;Sit to/from stand   Upper Body Dressing : Minimal assistance;Sitting   Lower Body Dressing: Minimal assistance;Sit to/from stand   Toilet Transfer: Minimal assistance;Ambulation;RW   Toileting- Clothing Manipulation and Hygiene: Minimal assistance;Sit to/from stand       Functional mobility during ADLs: Minimal assistance;Rolling walker       Vision Patient Visual Report: No change from baseline Vision Assessment?: No apparent visual deficits     Perception     Praxis      Pertinent Vitals/Pain Pain Assessment: No/denies pain     Hand Dominance Right   Extremity/Trunk Assessment Upper Extremity Assessment Upper Extremity Assessment: Overall WFL for tasks assessed   Lower Extremity Assessment Lower Extremity Assessment: Defer to PT evaluation   Cervical / Trunk Assessment Cervical / Trunk Assessment: Kyphotic   Communication Communication Communication: HOH   Cognition Arousal/Alertness: Awake/alert Behavior During Therapy: WFL for tasks assessed/performed Overall Cognitive Status: History of cognitive impairments - at baseline  General Comments: pt with poor memory at baseline, highly dependent on daughter   General Comments  Daughter present during session, provided chair follow. BP 117/85.    Exercises     Shoulder  Instructions      Home Living Family/patient expects to be discharged to:: Skilled nursing facility Living Arrangements: Alone Available Help at Discharge: Family;Available PRN/intermittently Type of Home: House Home Access: Stairs to enter [employer] of Steps: 3 Entrance Stairs-Rails: Right;Left Home Layout: Laundry or work area in basement;Able to live on main level with bedroom/bathroom     Bathroom Shower/Tub: Occupational psychologist: Standard     Home Equipment: Environmental consultant - 2 wheels;Cane - single point;Grab bars - toilet          Prior Functioning/Environment Level of Independence: Needs assistance  Gait / Transfers Assistance Needed: refuses use of cane or walker ADL's / Homemaking Assistance Needed: assisted for for meals and housekeeping   Comments: does not drive and is sedentary, daughter is concerned with his safety at home        OT Problem List: Decreased strength;Decreased activity tolerance;Impaired balance (sitting and/or standing);Decreased safety awareness;Decreased knowledge of use of DME or AE;Cardiopulmonary status limiting activity      OT Treatment/Interventions: Self-care/ADL training;DME and/or AE instruction;Patient/family education;Balance training    OT Goals(Current goals can be found in the care plan section) Acute Rehab OT Goals Patient Stated Goal: to get stronger and go home OT Goal Formulation: With patient Time For Goal Achievement: 06/04/17 Potential to Achieve Goals: Good ADL Goals Pt Will Perform Grooming: with supervision;standing Pt Will Perform Lower Body Bathing: with supervision;sit to/from stand Pt Will Perform Lower Body Dressing: with supervision;sit to/from stand Pt Will Transfer to Toilet: with supervision;ambulating;regular height toilet Pt Will Perform Toileting - Clothing Manipulation and hygiene: with supervision;sit to/from stand Additional ADL Goal #1: pt will perform bed mobility modified  independently.  OT Frequency: Min 2X/week   Barriers to D/C:            Co-evaluation              AM-PAC PT "6 Clicks" Daily Activity     Outcome Measure Help from another person eating meals?: None Help from another person taking care of personal grooming?: A Little Help from another person toileting, which includes using toliet, bedpan, or urinal?: A Little Help from another person bathing (including washing, rinsing, drying)?: A Little Help from another person to put on and taking off regular upper body clothing?: A Little Help from another person to put on and taking off regular lower body clothing?: A Little 6 Click Score: 19   End of Session Equipment Utilized During Treatment: Gait belt;Rolling walker  Activity Tolerance: Patient tolerated treatment well Patient left: in bed;with call bell/phone within reach;with family/visitor present  OT Visit Diagnosis: Unsteadiness on feet (R26.81);Muscle weakness (generalized) (M62.81);Other symptoms and signs involving cognitive function                Time: 1610-9604 OT Time Calculation (min): 42 min Charges:  OT General Charges $OT Visit: 1 Procedure OT Evaluation $OT Eval Moderate Complexity: 1 Procedure OT Treatments $Self Care/Home Management : 23-37 mins G-Codes:      Malka So 05/21/2017, 12:06 PM  [phone]

## 2017-05-21 NOTE — Clinical Social Work Placement (Signed)
   CLINICAL SOCIAL WORK PLACEMENT  NOTE  Date:  05/21/2017  Patient Details  Name: Bruce Green MRN: [id number] Date of Birth: [birth year]  Clinical Social Work is seeking post-discharge placement for this patient at the [place] level of care (*CSW will initial, date and re-position this form in  chart as items are completed):  Yes   Patient/family provided with [facility] Work Department's list of facilities offering this level of care within the geographic area requested by the patient (or if unable, by the patient's family).  Yes   Patient/family informed of their freedom to choose among providers that offer the needed level of care, that participate in Medicare, Medicaid or managed care program needed by the patient, have an available bed and are willing to accept the patient.  Yes   Patient/family informed of [facility]'s ownership interest in [facility] and [facility], as well as of the fact that they are under no obligation to receive care at these facilities.  PASRR submitted to EDS on 05/21/17     PASRR number received on 05/21/17     Existing PASRR number confirmed on       FL2 transmitted to all facilities in geographic area requested by pt/family on 05/21/17     FL2 transmitted to all facilities within larger geographic area on       Patient informed that his/her managed care company has contracts with or will negotiate with certain facilities, including the following:  [employer]     Yes   Patient/family informed of bed offers received.  Patient chooses bed at [facility]     Physician recommends and patient chooses bed at      Patient to be transferred to [facility] on 05/21/17.  Patient to be transferred to facility by [facility]     Patient family notified on 05/21/17 of transfer.  Name of family member notified:  [name], daughter     PHYSICIAN Please sign FL2     Additional Comment:     _______________________________________________ Estanislado Emms, LCSW 05/21/2017, 2:55 PM
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
## 2017-05-21 NOTE — Discharge Summary (Signed)
[facility]  Physician Discharge Summary   Patient ID: Bruce Green MRN: [id number] DOB/AGE: Aug 23, 1919 83 y.o.  Admit date: 05/13/2017 Discharge date: 05/21/2017  PCP: Kathyrn Lass, MD  DISCHARGE DIAGNOSES:  Principal Problem:   Gross hematuria Active Problems:   GERD with stricture   CAD (coronary artery disease)   Orthostatic hypotension   CAP (community acquired pneumonia)   Elevated LFTs   Hyperbilirubinemia   Pain in joint of left shoulder   AAA (abdominal aortic aneurysm) without rupture (HCC)   RECOMMENDATIONS FOR OUTPATIENT FOLLOW UP: 1. Please check basic metabolic panel tomorrow, 1/30 to check potassium level and then check weekly for the initial few weeks to monitor potassium and creatinine. 2. Cardiology to arrange outpatient follow-up 3. Please schedule outpatient follow-up with gastroenterologist, Dr. Paulita Fujita  DISCHARGE CONDITION: fair  Diet recommendation: Heart healthy  Filed Weights   05/19/17 0429 05/20/17 0500 05/21/17 0450  Weight: 72.7 kg (160 lb 4.8 oz) 71.7 kg (158 lb 1.6 oz) 69.7 kg (153 lb 9.6 oz)    INITIAL HISTORY: 83 year old Caucasian male with a past medical history of hypertension, coronary artery disease, GERD, recent pneumonia, presented with shortness of breath and abnormal LFTs. Patient was seen by gastroenterology and underwent MRCP which suggested a CBD stone.  Consultants: Cardiology. Gastroenterology  Procedures:   Transthoracic echocardiogram Study Conclusions  - Left ventricle: EF hard to judge due to rapid afib. Inferior and septal akinesis The cavity size was moderately dilated. Wall thickness was normal. Systolic function was severely reduced. The estimated ejection fraction was in the range of 25% to 30%. The study is not technically sufficient to allow evaluation of LV diastolic function. - Aortic valve: There was mild regurgitation. - Mitral valve: There was moderate regurgitation. - Left  atrium: The atrium was moderately dilated. - Right atrium: The atrium was mildly dilated. - Atrial septum: No defect or patent foramen ovale was identified. - Tricuspid valve: There was moderate regurgitation. - Pulmonary arteries: PA peak pressure: 31 mm Hg (S).  TEE Impression:  1. EF 25-30% with mildly dilated LV.  2. Mildly dilated RV with moderately decreased systolic function.  3. Severe, probably functional MR.  4. No LA appendage thrombus, proceed with DCCV.   DC cardioversion 6/26   HOSPITAL COURSE:   Choledocholithiasis/transaminitis. Patient had an elevated AST, ALT and bilirubin. These have been trending down. MRI did not show any liver mass or pancreatic mass, however, did suggest choledocholithiasis. Gastroenterology was consulted. They would defer ERCP until  the patient is able to come off of anticoagulation. If the patient develops signs of cholangitis or recurrent obstruction and patient is on anticoagulation, then ERCP with stent placement without sphincterotomy to be considered. Hepatitis panel negative. GI recommends follow-up in 2-4 weeks. No need for any procedures at this time. Elective ERCP can be considered as an outpatient. Anticoagulation will need to be held for 2-3 days for the ERCP to be performed.  Community-acquired pneumonia. Patient has completed course of azithromycin. Changed ceftriaxone to [facility] for a few more days. WBC has been better. Blood cultures without any growth. Chest x-ray was repeated and shows bilateral pleural effusions. No obvious infiltrate is noted.  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 Cardiology was consulted. Patient underwent DC cardioversion on 6/26. Echocardiogram shows EF to be 25%. Patient was initially placed on heparin infusion and amiodarone. Changed over to oral anticoagulation in the form of Eliquis. Changed over to oral amiodarone. Lasix  was  changed over to oral form. Patient is doing well. Continue to monitor as outpatient. His potassium is noted to be low today. This will be repleted aggressively. Recommend rechecking labs tomorrow at the skilled nursing facility. Discussed with cardiologist today. Okay for discharge from their perspective.  Elevated creatinine Unclear if the patient has chronic kidney disease or not. Mildly elevated creatinine, possibly due to diuretics. Creatinine is better today. Needs continued outpatient monitoring.  Hypokalemia. Being repleted. Will need monitoring at the skilled nursing facility. Magnesium was 1.8.  Left shoulder pain. X-ray did not show any fractures. Thought to be a muscular sprain.  Bilateral renal cysts Noted on MRI. No further workup needed.  History of gross hematuria. UA in the hospital did not show any hematuria. Could have misconstrued dark urine due to bilirubin in the urine. No further workup is needed.  History of coronary artery disease. Stable.  History of orthostatic hypotension. Midodrine has been stopped due to atrial fibrillation. Patient seems to be doing well.  Infrarenal aortic aneurysm noted on MRI. Will need outpatient monitoring if indicated.  Overall improved. Discussed in detail with patient and his daughter. Okay for discharge to skilled nursing facility today.    PERTINENT LABS:  The results of significant diagnostics from this hospitalization (including imaging, microbiology, ancillary and laboratory) are listed below for reference.    Microbiology: Recent Results (from the past 240 hour(s))  Urine culture     Status: None   Collection Time: 05/13/17  2:03 AM  Result Value Ref Range Status   Specimen Description URINE, RANDOM  Final   Special Requests NONE  Final   Culture NO GROWTH  Final   Report Status 05/15/2017 FINAL  Final  Culture, blood (routine x 2)     Status: None   Collection Time: 05/14/17 11:33 AM  Result Value Ref  Range Status   Specimen Description BLOOD LEFT HAND  Final   Special Requests IN PEDIATRIC BOTTLE Blood Culture adequate volume  Final   Culture NO GROWTH 5 DAYS  Final   Report Status 05/19/2017 FINAL  Final  Culture, blood (routine x 2)     Status: None   Collection Time: 05/14/17 11:48 AM  Result Value Ref Range Status   Specimen Description BLOOD LEFT ANTECUBITAL  Final   Special Requests   Final    BOTTLES DRAWN AEROBIC AND ANAEROBIC Blood Culture adequate volume   Culture NO GROWTH 5 DAYS  Final   Report Status 05/19/2017 FINAL  Final     Labs: Basic Metabolic Panel:  Recent Labs Lab 05/15/17 0506  05/18/17 0208 05/19/17 0417 05/19/17 0954 05/20/17 0542 05/21/17 0458 05/21/17 0730  NA 136  < > 134* 131* 132* 134* 136  --   K 4.0  < > 3.3* 2.8* 3.1* 3.8 2.9*  --   CL 106  < > 100* 97* 97* 98* 99*  --   CO2 23  < > 24 25 26 28 23   --   GLUCOSE 131*  < > 132* 117* 125* 122* 113*  --   BUN 29*  < > 43* 37* 36* 33* 30*  --   CREATININE 1.19  < > 1.36* 1.36* 1.36* 1.52* 1.38*  --   CALCIUM 8.4*  < > 8.1* 8.0* 8.1* 8.0* 8.0*  --   MG 1.9  --   --  1.8  --   --   --  1.8  < > = values in this interval not displayed. Liver  Function Tests:  Recent Labs Lab 05/17/17 0248 05/18/17 0208 05/19/17 0417 05/20/17 0542 05/21/17 0458  AST 19 27 24 17 16   ALT 47 40 33 25 22  ALKPHOS 177* 157* 143* 125 120  BILITOT 1.8* 1.4* 1.6* 1.2 1.7*  PROT 5.1* 4.8* 4.8* 4.5* 4.5*  ALBUMIN 2.3* 2.3* 2.3* 2.1* 2.2*   CBC:  Recent Labs Lab 05/15/17 0506 05/16/17 0333 05/18/17 0208 05/19/17 0417 05/20/17 0542 05/21/17 0458  WBC 17.3* 15.5* 11.6* 11.9* 13.6* 12.0*  NEUTROABS 16.2* 14.0*  --   --   --   --   HGB 13.2 12.2* 12.3* 12.4* 11.7* 11.9*  HCT 40.1 37.3* 36.9* 36.6* 35.2* 35.9*  MCV 84.2 83.4 82.6 81.3 83.0 83.1  PLT 163 180 223 292 301 303   BNP: BNP (last 3 results)  Recent Labs  05/14/17 1133  BNP 850.4*     CBG:  Recent Labs Lab 05/20/17 1221  05/20/17 1641 05/20/17 2119 05/21/17 0801 05/21/17 1114  GLUCAP 128* 102* 143* 103* 148*     IMAGING STUDIES Dg Chest 2 View  Result Date: 05/14/2017 CLINICAL DATA:  Pain in the left shoulder and productive cough EXAM: CHEST  2 VIEW COMPARISON:  May 12, 2017 FINDINGS: The mediastinal contour is normal. Patient status post prior median sternotomy and CABG. The heart size is enlarged. Mild patchy opacity is identified in the right mid and lung base. There is no pulmonary edema. There is minimal left pleural effusion. The visualized skeletal structures are stable. IMPRESSION: Mild patchy opacity of the right mid and lung base, developing pneumonia is not excluded. Electronically Signed   By: Abelardo Diesel M.D.   On: 05/14/2017 16:52   US Abdomen Complete  Result Date: 05/13/2017 CLINICAL DATA:  83 year old male with elevated LFT and hyperbilirubinemia and gross hematuria. EXAM: ABDOMEN ULTRASOUND COMPLETE COMPARISON:  None. FINDINGS: Gallbladder: Cholecystectomy. Common bile duct: Diameter: 15 mm. Liver: There is an 8 x 8 x 8 mm echogenic lesion in the right lobe of the liver which is not well characterized but may represent a hemangioma. Other etiologies are not excluded. MRI may provide better characterization if clinically indicated. The liver is otherwise unremarkable as visualized. IVC: No abnormality visualized. Pancreas: Poorly visualized and obscured by bowel gas. Spleen: Size and appearance within normal limits. Right Kidney: Length: 12.3 cm. Echogenicity within normal limits. No hydronephrosis visualized. Left Kidney: Length: 12.3 cm. Echogenicity within normal limits. No hydronephrosis visualized. There is a 1.7 x 1.2 x 1.3 cm interpolar cyst. Abdominal aorta: There is atherosclerotic calcification of the aorta. There is aneurysmal dilatation of the midportion of the abdominal aorta measuring up to 3.7 cm. Other findings: None. IMPRESSION: 1. Subcentimeter right liver echogenic lesion,  incompletely characterized, possibly a hemangioma. MRI may provide better characterization if clinically indicated. 2. Cholecystectomy. 3. No hydronephrosis or echogenic stone. 4. Abdominal aortic aneurysm measures 3.7 cm. Recommend followup by ultrasound in 2 years. This recommendation follows ACR consensus guidelines: White Paper of the ACR Incidental Findings Committee II on Vascular Findings. J Am Coll Radiol 2013; 67:619-509. 5.  Aortic Atherosclerosis (ICD10-I70.0). Electronically Signed   By: Anner Crete M.D.   On: 05/13/2017 22:51   Mr 3d Recon At Scanner  Result Date: 05/16/2017 CLINICAL DATA:  Inpatient. Jaundice. Dilated common bile duct and echogenic liver lesion on recent sonogram. Prior cholecystectomy. EXAM: MRI ABDOMEN WITHOUT AND WITH CONTRAST (INCLUDING MRCP) TECHNIQUE: Multiplanar multisequence MR imaging of the abdomen was performed both before and after the administration of intravenous  contrast. Heavily T2-weighted images of the biliary and pancreatic ducts were obtained, and three-dimensional MRCP images were rendered by post processing. CONTRAST:  2mL MULTIHANCE GADOBENATE DIMEGLUMINE 529 MG/ML IV SOLN COMPARISON:  05/13/2017 abdominal sonogram. FINDINGS: Motion degraded scan. Lower chest: Patchy consolidation at the left lung base (series 5/image 5). Cardiomegaly. Hepatobiliary: Normal liver size and configuration. No hepatic steatosis. No liver mass. Cholecystectomy. Mild to moderate diffuse intrahepatic biliary ductal dilatation. Dilated common bile duct (17 mm diameter). At least 4 stones are seen in the lower third of the common bile duct, including a 9 mm stone lodged in the ampulla. Small posterior periampullary duodenal diverticulum. No evidence of enhancing biliary or ampullary mass. Pancreas: No gross pancreatic mass or duct dilation. Pancreatic ductal system is poorly evaluated on these motion degraded images. Cannot accurately assess for pancreas divisum. Spleen: Normal  size. No mass. Adrenals/Urinary Tract: No discrete adrenal nodules. No hydronephrosis. Small simple parapelvic renal cysts in the left greater than right kidneys. Subcentimeter simple renal cortical cysts in both kidneys. No suspicious renal masses. Stomach/Bowel: Small hiatal hernia. Otherwise collapsed and grossly normal stomach. Visualized small and large bowel is normal caliber, with no bowel wall thickening. Vascular/Lymphatic: Atherosclerotic abdominal aorta with 3.9 cm infrarenal abdominal aortic aneurysm. Patent portal, splenic, hepatic and renal veins. Contrast reflux into the IVC and hepatic veins on the arterial phase sequence. No pathologically enlarged lymph nodes in the abdomen. Other: No abdominal ascites or focal fluid collection. Musculoskeletal: No aggressive appearing focal osseous lesions. IMPRESSION: 1. Cholecystectomy. Choledocholithiasis in the lower third of the common bile duct, including a 9 mm stone lodged in the ampulla. Dilated common bile duct (17 mm diameter). Mild-to-moderate diffuse intrahepatic biliary ductal dilatation. Small periampullary duodenal diverticulum. 2. Patchy left lung base consolidation, poorly evaluated by MRI. Pneumonia not excluded. Recommend either short-term follow-up chest radiographs or dedicated chest CT. 3. Aortic atherosclerosis. Infrarenal 3.9 cm abdominal aortic aneurysm. Recommend followup by ultrasound in 2 years. This recommendation follows ACR consensus guidelines: White Paper of the ACR Incidental Findings Committee II on Vascular Findings. J Am Coll Radiol 2013; 10:789-794. 4. Cardiomegaly. Contrast reflux into the IVC and hepatic veins, suggesting elevated central venous pressures/right heart failure. 5. Small hiatal hernia. Electronically Signed   By: Ilona Sorrel M.D.   On: 05/16/2017 10:25   Dg Chest Port 1 View  Result Date: 05/20/2017 CLINICAL DATA:  Shortness of breath . EXAM: PORTABLE CHEST 1 VIEW COMPARISON:  05/17/2017 . FINDINGS: Prior  CABG. Cardiomegaly with mild bilateral from interstitial prominence and small bilateral pleural effusions. Findings consistent with CHF. Mild pneumonia in the lung bases cannot be excluded. Similar findings noted on prior exam. IMPRESSION: Prior CABG. Cardiomegaly with mild bilateral pulmonary interstitial prominence and bilateral pleural effusions consistent with mild CHF. Mild pneumonia in the lung bases cannot be excluded. Electronically Signed   By: Marcello Moores  Register   On: 05/20/2017 10:03   Dg Chest Port 1v Same Day  Result Date: 05/17/2017 CLINICAL DATA:  CHF. EXAM: PORTABLE CHEST 1 VIEW COMPARISON:  May 14, 2017 FINDINGS: No pneumothorax. Persistent patchy opacity in the right mid and lower lung and in the left base. Cardiomegaly. No other interval changes. IMPRESSION: Persistent patchy opacities in the right mid and lower lung in addition to the left base. Electronically Signed   By: Dorise Bullion III M.D   On: 05/17/2017 14:09   Dg Shoulder Left  Result Date: 05/14/2017 CLINICAL DATA:  Left shoulder pain EXAM: LEFT SHOULDER - 2+ VIEW COMPARISON:  None. FINDINGS: There is no evidence of fracture or dislocation. Degenerative joint changes of the left acromioclavicular joint is noted. Soft tissues are unremarkable. IMPRESSION: No acute fracture or dislocation. Electronically Signed   By: Abelardo Diesel M.D.   On: 05/14/2017 16:55   Mr Abdomen Mrcp Moise Boring Contast  Result Date: 05/16/2017 CLINICAL DATA:  Inpatient. Jaundice. Dilated common bile duct and echogenic liver lesion on recent sonogram. Prior cholecystectomy. EXAM: MRI ABDOMEN WITHOUT AND WITH CONTRAST (INCLUDING MRCP) TECHNIQUE: Multiplanar multisequence MR imaging of the abdomen was performed both before and after the administration of intravenous contrast. Heavily T2-weighted images of the biliary and pancreatic ducts were obtained, and three-dimensional MRCP images were rendered by post processing. CONTRAST:  46mL MULTIHANCE GADOBENATE  DIMEGLUMINE 529 MG/ML IV SOLN COMPARISON:  05/13/2017 abdominal sonogram. FINDINGS: Motion degraded scan. Lower chest: Patchy consolidation at the left lung base (series 5/image 5). Cardiomegaly. Hepatobiliary: Normal liver size and configuration. No hepatic steatosis. No liver mass. Cholecystectomy. Mild to moderate diffuse intrahepatic biliary ductal dilatation. Dilated common bile duct (17 mm diameter). At least 4 stones are seen in the lower third of the common bile duct, including a 9 mm stone lodged in the ampulla. Small posterior periampullary duodenal diverticulum. No evidence of enhancing biliary or ampullary mass. Pancreas: No gross pancreatic mass or duct dilation. Pancreatic ductal system is poorly evaluated on these motion degraded images. Cannot accurately assess for pancreas divisum. Spleen: Normal size. No mass. Adrenals/Urinary Tract: No discrete adrenal nodules. No hydronephrosis. Small simple parapelvic renal cysts in the left greater than right kidneys. Subcentimeter simple renal cortical cysts in both kidneys. No suspicious renal masses. Stomach/Bowel: Small hiatal hernia. Otherwise collapsed and grossly normal stomach. Visualized small and large bowel is normal caliber, with no bowel wall thickening. Vascular/Lymphatic: Atherosclerotic abdominal aorta with 3.9 cm infrarenal abdominal aortic aneurysm. Patent portal, splenic, hepatic and renal veins. Contrast reflux into the IVC and hepatic veins on the arterial phase sequence. No pathologically enlarged lymph nodes in the abdomen. Other: No abdominal ascites or focal fluid collection. Musculoskeletal: No aggressive appearing focal osseous lesions. IMPRESSION: 1. Cholecystectomy. Choledocholithiasis in the lower third of the common bile duct, including a 9 mm stone lodged in the ampulla. Dilated common bile duct (17 mm diameter). Mild-to-moderate diffuse intrahepatic biliary ductal dilatation. Small periampullary duodenal diverticulum. 2. Patchy  left lung base consolidation, poorly evaluated by MRI. Pneumonia not excluded. Recommend either short-term follow-up chest radiographs or dedicated chest CT. 3. Aortic atherosclerosis. Infrarenal 3.9 cm abdominal aortic aneurysm. Recommend followup by ultrasound in 2 years. This recommendation follows ACR consensus guidelines: White Paper of the ACR Incidental Findings Committee II on Vascular Findings. J Am Coll Radiol 2013; 10:789-794. 4. Cardiomegaly. Contrast reflux into the IVC and hepatic veins, suggesting elevated central venous pressures/right heart failure. 5. Small hiatal hernia. Electronically Signed   By: Ilona Sorrel M.D.   On: 05/16/2017 10:25    DISCHARGE EXAMINATION: Vitals:   05/20/17 2122 05/21/17 0450 05/21/17 0941 05/21/17 1000  BP: 107/64 118/66  117/85  Pulse: 74 72 96 85  Resp: 20 18  (!) 25  Temp: 98.3 F (36.8 C) 98 F (36.7 C)    TempSrc: Oral Oral    SpO2:  92%  97%  Weight:  69.7 kg (153 lb 9.6 oz)    Height:       General appearance: alert, cooperative, appears stated age and no distress Resp: Coarse breath sounds bilaterally. Few crackles at the bases. No wheezing Cardio: regular rate  and rhythm, S1, S2 normal, no murmur, click, rub or gallop GI: soft, non-tender; bowel sounds normal; no masses,  no organomegaly  DISPOSITION: SNF  Discharge Instructions    (HEART FAILURE PATIENTS) Call MD:  Anytime you have any of the following symptoms: 1) 3 pound weight gain in 24 hours or 5 pounds in 1 week 2) shortness of breath, with or without a dry hacking cough 3) swelling in the hands, feet or stomach 4) if you have to sleep on extra pillows at night in order to breathe.    Complete by:  As directed    Call MD for:  difficulty breathing, headache or visual disturbances    Complete by:  As directed    Call MD for:  extreme fatigue    Complete by:  As directed    Call MD for:  persistant dizziness or light-headedness    Complete by:  As directed    Call MD for:   persistant nausea and vomiting    Complete by:  As directed    Call MD for:  severe uncontrolled pain    Complete by:  As directed    Call MD for:  temperature >100.4    Complete by:  As directed    Discharge instructions    Complete by:  As directed    Please see instructions on the discharge summary.  You were cared for by a hospitalist during your hospital stay. If you have any questions about your discharge medications or the care you received while you were in the hospital after you are discharged, you can call the unit and asked to speak with the hospitalist on call if the hospitalist that took care of you is not available. Once you are discharged, your primary care physician will handle any further medical issues. Please note that NO REFILLS for any discharge medications will be authorized once you are discharged, as it is imperative that you return to your primary care physician (or establish a relationship with a primary care physician if you do not have one) for your aftercare needs so that they can reassess your need for medications and monitor your lab values. If you do not have a primary care physician, you can call [phone] for a physician referral.   Increase activity slowly    Complete by:  As directed       ALLERGIES:  Allergies  Allergen Reactions  . Sulfa Antibiotics Rash     Current Discharge Medication List    START taking these medications   Details  amiodarone (PACERONE) 400 MG tablet Take 1 tablet (400 mg total) by mouth 2 (two) times daily.    apixaban (ELIQUIS) 2.5 MG TABS tablet Take 1 tablet (2.5 mg total) by mouth 2 (two) times daily. Qty: 60 tablet    carvedilol (COREG) 3.125 MG tablet Take 1 tablet (3.125 mg total) by mouth 2 (two) times daily with a meal.    cefpodoxime (VANTIN) 200 MG tablet Take 1 tablet (200 mg total) by mouth every 12 (twelve) hours. For 2 more days    furosemide (LASIX) 40 MG tablet Take 1 tablet (40 mg total) by mouth 2 (two)  times daily. Qty: 30 tablet    polyethylene glycol (MIRALAX / GLYCOLAX) packet Take 17 g by mouth daily as needed for mild constipation. Qty: 14 each, Refills: 0    potassium chloride SA (K-DUR,KLOR-CON) 20 MEQ tablet Take 2 tablets (40 mEq total) by mouth daily. Starting tonight (6/28) Qty: 1 tablet,  Refills: 0    tamsulosin (FLOMAX) 0.4 MG CAPS capsule Take 1 capsule (0.4 mg total) by mouth daily after supper. Qty: 30 capsule      CONTINUE these medications which have NOT CHANGED   Details  atorvastatin (LIPITOR) 80 MG tablet Take 40 mg by mouth daily.    B Complex-C (B-COMPLEX WITH VITAMIN C) tablet Take 1 tablet by mouth daily.    Cholecalciferol (VITAMIN D3) 5000 units TABS Take 1 tablet by mouth every other day.    docusate sodium (COLACE) 100 MG capsule Take 100 mg by mouth daily.    folic acid (FOLVITE) 1 MG tablet Take 400 mcg by mouth daily.     GUAIFENESIN 1200 PO Take 1,200 mg by mouth.    loratadine (CLARITIN) 10 MG tablet Take 1 tablet by mouth daily.    Magnesium 400 MG CAPS Take 400 mg by mouth daily.    Multiple Vitamins-Minerals (MULTIVITAMIN WITH MINERALS) tablet Take 1 tablet by mouth daily.    pantoprazole (PROTONIX) 40 MG tablet TAKE 1 TABLET (40 MG TOTAL) BY MOUTH DAILY BEFORE BREAKFAST. Qty: 90 tablet, Refills: 0    Probiotic Product (PROBIOTIC PO) Take 1 tablet by mouth daily.    acetaminophen (TYLENOL) 325 MG tablet Take 650 mg by mouth every 6 (six) hours as needed for moderate pain.    Calcium-Magnesium 500-250 MG TABS Take 1 tablet by mouth daily.      STOP taking these medications     aspirin 81 MG tablet      azithromycin (ZITHROMAX) 250 MG tablet      Glucosamine-Chondroit-Vit C-Mn (GLUCOSAMINE 1500 COMPLEX PO)      isosorbide mononitrate (ISMO,MONOKET) 10 MG tablet      midodrine (PROAMATINE) 2.5 MG tablet          Follow-up Information    Kathyrn Lass, MD. Schedule an appointment as soon as possible for a visit in 3  week(s).   Specialty:  Family Medicine Contact information: [place] [address] [phone]        Arta Silence, MD. Schedule an appointment as soon as possible for a visit in 3 week(s).   Specialty:  Gastroenterology Contact information: [address] [place] [address] [phone]           TOTAL DISCHARGE TIME: 35 mins  [facility] Pager [phone]  05/21/2017, 12:48 PM

## 2017-05-21 NOTE — Progress Notes (Signed)
Patient will discharge to [facility] Anticipated discharge date: 05/21/17 Family notified: [name], daughter Transportation by: Corey Harold   CSW signing off.  Estanislado Emms, [place]  Clinical Social Worker

## 2017-06-01 ENCOUNTER — Telehealth: Payer: Self-pay | Admitting: Cardiovascular Disease

## 2017-06-01 NOTE — Telephone Encounter (Signed)
Closed Encounter  °

## 2017-06-04 ENCOUNTER — Ambulatory Visit: Payer: Medicare Other | Admitting: Cardiovascular Disease

## 2017-06-22 ENCOUNTER — Ambulatory Visit (INDEPENDENT_AMBULATORY_CARE_PROVIDER_SITE_OTHER): Payer: Medicare Other | Admitting: Physician Assistant

## 2017-06-22 ENCOUNTER — Encounter: Payer: Self-pay | Admitting: Physician Assistant

## 2017-06-22 VITALS — BP 120/50 | HR 55 | Ht 70.0 in | Wt 143.0 lb

## 2017-06-22 DIAGNOSIS — Z7901 Long term (current) use of anticoagulants: Secondary | ICD-10-CM | POA: Diagnosis not present

## 2017-06-22 DIAGNOSIS — I481 Persistent atrial fibrillation: Secondary | ICD-10-CM

## 2017-06-22 DIAGNOSIS — R278 Other lack of coordination: Secondary | ICD-10-CM | POA: Diagnosis not present

## 2017-06-22 DIAGNOSIS — I951 Orthostatic hypotension: Secondary | ICD-10-CM | POA: Diagnosis not present

## 2017-06-22 DIAGNOSIS — I4819 Other persistent atrial fibrillation: Secondary | ICD-10-CM

## 2017-06-22 MED ORDER — POTASSIUM CHLORIDE CRYS ER 20 MEQ PO TBCR: 20.0000 meq | EXTENDED_RELEASE_TABLET | Freq: Every day | ORAL | 5 refills | Status: DC

## 2017-06-22 MED ORDER — FUROSEMIDE 40 MG PO TABS: 40.0000 mg | ORAL_TABLET | Freq: Every day | ORAL | Status: DC

## 2017-06-22 MED ORDER — AMIODARONE HCL 200 MG PO TABS: 200.0000 mg | ORAL_TABLET | Freq: Two times a day (BID) | ORAL | 0 refills | Status: DC

## 2017-06-22 NOTE — Patient Instructions (Addendum)
Medication Instructions:  DECREASE AMIODARONE 200MG  TWICE DAILY FOR 2 WEEKS THEN 200MG  DAILY HOLD LASIX TOMORROW THEN RESTART Wednesday LASIX 40MG  DAILY DECREASE POTASSIUM TO 20MEQ DAILY  If you need a refill on your cardiac medications before your next appointment, please call your pharmacy.  Labwork: BMET AND MAG TODAY HERE IN OUR OFFICE AT [facility]  Follow-Up: Your physician wants you to follow-up in: 1ST AVAILABLE WITH DR [id number].   Special Instructions: DAILY WEIGHTS CALL IF >3LB IN ONE DAY -OR- 5LB IN ONE WEEK  Thank you for choosing [facility] at [facility]!!

## 2017-06-22 NOTE — Progress Notes (Signed)
Cardiology Office Note   Date:  06/22/2017   ID:  Bruce Green, Bruce Green 1919-04-09, MRN [id number]  PCP:  Kathyrn Lass, MD  Cardiologist:  Dr Juanetta Snow, PA-C   Chief Complaint  Patient presents with  . Follow-up    cardioversion    History of Present Illness: Bruce Green is a 83 y.o. male with a history of CAD/CABG 2016 with improved EF to 45%, HTN, GERD, PNA, abnl LFTs.  Admit 06/20-06/28 with hematuria, rapid atrial fib, CBD stone (ERCP deferred until he can come off anticoagulation), PNA. Long QTc felt 2nd amio. Pt had TEE/DCCV 05/19/2017.  Bruce Green presents for cardiology follow up. He is here today with his daughter, [name].  He is not having any bleeding issues on the Eliquis. He is having no GI issues. He never had the ERCP. He had LFTs a few days ago, was told they are ok. He is compliant with his medications.   He is getting light-headed and dizzy when he stands and walks. He cannot get 5 feet without having to stop. He hangs over the walker, catching his breath and getting his head cleared.   He was taken off his midodrine because it was thought that would make him more likely to have atrial fib. He is on the amiodarone at 400 mg bid.   He is upset because he cannot sleep. He has problems with involuntary jerking, this happens at night when he is trying to sleep. It also happens when he is walking or just sitting still. There is no pain involved, it is bilateral and in both his arms and legs.   He has been losing weight. He has lost 10 lbs in the last month. He is not eating very well. He is not on potassium with the Lasix. He is on magnesium supplement.    Past Medical History:  Diagnosis Date  . Anxiety   . Cataract   . CKD (chronic kidney disease)   . Colon polyp   . Coronary artery disease 04/21/2014     severe triple vessel  . Dyslipidemia   . GERD (gastroesophageal reflux disease)   . Hiatal hernia    6cm  . Ischemic  cardiomyopathy 03/2014   EF 25-30%  . Pneumonia   . Stricture esophagus    distal  . TIA (transient ischemic attack)   . Vitamin D deficiency     Past Surgical History:  Procedure Laterality Date  . APPENDECTOMY    . CARDIAC SURGERY    . CARDIOVERSION N/A 05/19/2017   Procedure: CARDIOVERSION;  Surgeon: Larey Dresser, MD;  Location: [facility] ENDOSCOPY;  Service: Cardiovascular;  Laterality: N/A;  . CHOLECYSTECTOMY    . CORONARY ARTERY BYPASS GRAFT N/A 04/24/2014   Procedure: CORONARY ARTERY BYPASS GRAFTING (CABG);  Surgeon: Gaye Pollack, MD;  Location: [place];  Service: Open Heart Surgery;  Laterality: N/A;  Times 3 using left internal mammary artery and endoscopically harvested right saphenous vein  . ESOPHAGOGASTRODUODENOSCOPY  04/09/2012   Procedure: ESOPHAGOGASTRODUODENOSCOPY (EGD);  Surgeon: Gatha Mayer, MD;  Location: [facility] ENDOSCOPY;  Service: Endoscopy;  Laterality: N/A;  . INTRAOPERATIVE TRANSESOPHAGEAL ECHOCARDIOGRAM N/A 04/24/2014   Procedure: INTRAOPERATIVE TRANSESOPHAGEAL ECHOCARDIOGRAM;  Surgeon: Gaye Pollack, MD;  Location: [facility] OR;  Service: Open Heart Surgery;  Laterality: N/A;  . LEFT AND RIGHT HEART CATHETERIZATION WITH CORONARY ANGIOGRAM N/A 04/21/2014   Procedure: LEFT AND RIGHT HEART CATHETERIZATION WITH CORONARY ANGIOGRAM;  Surgeon: Burnell Blanks,  MD;  Location: [place] CATH LAB;  Service: Cardiovascular;  Laterality: N/A;  . SHOULDER SURGERY    . TEE WITHOUT CARDIOVERSION N/A 05/19/2017   Procedure: TRANSESOPHAGEAL ECHOCARDIOGRAM (TEE);  Surgeon: Larey Dresser, MD;  Location: [facility] ENDOSCOPY;  Service: Cardiovascular;  Laterality: N/A;    Medication Sig  . acetaminophen (TYLENOL) 325 MG tablet Take 650 mg by mouth every 6 (six) hours as needed for moderate pain.  Marland Kitchen amiodarone (PACERONE) 400 MG tablet Take 1 tablet (400 mg total) by mouth 2 (two) times daily.  Marland Kitchen apixaban (ELIQUIS) 2.5 MG TABS tablet Take 1 tablet (2.5 mg total) by mouth 2 (two) times daily.  Marland Kitchen  atorvastatin (LIPITOR) 80 MG tablet Take 40 mg by mouth daily.  . B Complex-C (B-COMPLEX WITH VITAMIN C) tablet Take 1 tablet by mouth daily.  . Calcium-Magnesium 500-250 MG TABS Take 1 tablet by mouth daily.  . carvedilol (COREG) 3.125 MG tablet Take 1 tablet (3.125 mg total) by mouth 2 (two) times daily with a meal.  . Cholecalciferol (VITAMIN D3) 5000 units TABS Take 1 tablet by mouth every other day.  . docusate sodium (COLACE) 100 MG capsule Take 100 mg by mouth daily.  . folic acid (FOLVITE) 1 MG tablet Take 400 mcg by mouth daily.   . furosemide (LASIX) 40 MG tablet Take 1 tablet (40 mg total) by mouth 2 (two) times daily.  . GUAIFENESIN 1200 PO Take 1,200 mg by mouth.  . loratadine (CLARITIN) 10 MG tablet Take 1 tablet by mouth daily.  . Magnesium 400 MG CAPS Take 400 mg by mouth daily.  . Multiple Vitamins-Minerals (MULTIVITAMIN WITH MINERALS) tablet Take 1 tablet by mouth daily.  . pantoprazole (PROTONIX) 40 MG tablet TAKE 1 TABLET (40 MG TOTAL) BY MOUTH DAILY BEFORE BREAKFAST.  Marland Kitchen polyethylene glycol (MIRALAX / GLYCOLAX) packet Take 17 g by mouth daily as needed for mild constipation.  . potassium chloride SA (K-DUR,KLOR-CON) 20 MEQ tablet Take 2 tablets (40 mEq total) by mouth daily. Starting tonight (6/28)  . Probiotic Product (PROBIOTIC PO) Take 1 tablet by mouth daily.  . tamsulosin (FLOMAX) 0.4 MG CAPS capsule Take 1 capsule (0.4 mg total) by mouth daily after supper.   No current facility-administered medications for this visit.     Allergies:   Sulfa antibiotics    Social History:  The patient  reports that he has never smoked. He has never used smokeless tobacco. He reports that he drinks about 0.6 oz of alcohol per week . He reports that he does not use drugs.   Family History:  The patient's family history includes Aneurysm in his brother; Arthritis in his sister; Breast cancer in his sister; Colon cancer in his brother; Dementia in his brother; Heart attack in his  mother; Kidney disease in his father; Osteoporosis in his sister.    ROS:  Please see the history of present illness. All other systems are reviewed and negative.    PHYSICAL EXAM: VS:  BP (!) 120/50   Pulse (!) 55   Ht 5\' 10"  (1.778 m)   Wt 143 lb (64.9 kg)   BMI 20.52 kg/m  , BMI Body mass index is 20.52 kg/m. GEN: Well nourished, thin, elderly, male in no acute distress  HEENT: normal for age  Neck: no JVD, no carotid bruit, no masses Cardiac: RRR; soft murmur, no rubs, or gallops Respiratory:  Decreased BS bases bilaterally, normal work of breathing GI: soft, nontender, nondistended, + BS MS: no deformity or atrophy; no  edema; distal pulses are 2+ in all 4 extremities; when he holds his arm straight out and dorsiflexes his hand, his arm starts jerking uncontrollably Skin: warm and dry, no rash Neuro:  Strength and sensation are intact Psych: euthymic mood, full affect   EKG:  EKG is ordered today. The ekg ordered today demonstrates sinus brady, HR 55, wavy baseline, no acute changes.  Transthoracic echocardiogram Study Conclusions - Left ventricle: EF hard to judge due to rapid afib. Inferior and septal akinesis The cavity size was moderately dilated. Wall thickness was normal. Systolic function was severely reduced. The estimated ejection fraction was in the range of 25% to 30%. The study is not technically sufficient to allow evaluation of LV diastolic function. - Aortic valve: There was mild regurgitation. - Mitral valve: There was moderate regurgitation. - Left atrium: The atrium was moderately dilated. - Right atrium: The atrium was mildly dilated. - Atrial septum: No defect or patent foramen ovale was identified. - Tricuspid valve: There was moderate regurgitation. - Pulmonary arteries: PA peak pressure: 31 mm Hg (S).  TEE Impression:  1. EF 25-30% with mildly dilated LV.  2. Mildly dilated RV with moderately decreased systolic function.  3.  Severe, probably functional MR.  4. No LA appendage thrombus, proceed with DCCV.   Recent Labs: 05/14/2017: B Natriuretic Peptide 850.4; TSH 0.790 05/21/2017: ALT 22; BUN 30; Creatinine, Ser 1.38; Hemoglobin 11.9; Magnesium 1.8; Platelets 303; Potassium 2.9; Sodium 136    Lipid Panel    Component Value Date/Time   CHOL 147 10/09/2016 0920   TRIG 234 (H) 10/09/2016 0920   HDL 39 (L) 10/09/2016 0920   CHOLHDL 3.8 10/09/2016 0920   VLDL 47 (H) 10/09/2016 0920   LDLCALC 61 10/09/2016 0920     Wt Readings from Last 3 Encounters:  06/22/17 143 lb (64.9 kg)  05/21/17 153 lb 9.6 oz (69.7 kg)  09/17/16 156 lb (70.8 kg)     Other studies Reviewed: Additional studies/ records that were reviewed today include: office notes, hospital records and testing.  ASSESSMENT AND PLAN:  1.  Orthostatic hypotension: he has a history of this, previously on midodrine. However, he may be over-diuresed. Will hold the Lasix for a day and restart at a lower dose. Add Kdur 20 meq qd. Check a BMET and Mg today. Once volume status improved, discuss putting pt back on midodrine or florinef.  2. Chronic anticoagulation: no bleeding issues  3. Hx CBD stone: no sx at this time.   4. Persistent atrial fib, s/p TEE/DCCV: Maintaining SR. Decrease amio gradually.   5. Asterixis: recent symptom. No meds that obviously cause this (ex. Reglan or lithium). Will ck BMET/Mg, if all is normal, may need to see neurology  6. Chronic systolic CHF: Daily weights, no volume overload by exam today. Decrease Lasix and follow.   Current medicines are reviewed at length with the patient today.  The patient does not have concerns regarding medicines.  The following changes have been made:  Decrease Lasix and amio  Labs/ tests ordered today include:   Orders Placed This Encounter  Procedures  . Basic metabolic panel  . Magnesium  . EKG 12-Lead     Disposition:   FU with Dr Sallyanne Kuster  Signed, Rosaria Ferries, PA-C    06/22/2017 11:58 PM    [place] Phone: [phone]; Fax: [phone]  This note was written with the assistance of speech recognition software. Please excuse any transcriptional errors.

## 2017-06-23 LAB — MAGNESIUM: MAGNESIUM: 2.4 mg/dL — AB (ref 1.6–2.3)

## 2017-06-23 LAB — BASIC METABOLIC PANEL
BUN/Creatinine Ratio: 20 (ref 10–24)
BUN: 31 mg/dL (ref 10–36)
CALCIUM: 8.7 mg/dL (ref 8.6–10.2)
CHLORIDE: 99 mmol/L (ref 96–106)
CO2: 27 mmol/L (ref 20–29)
CREATININE: 1.53 mg/dL — AB (ref 0.76–1.27)
GFR calc Af Amer: 44 mL/min/{1.73_m2} — ABNORMAL LOW (ref >59)
GFR calc non Af Amer: 38 mL/min/{1.73_m2} — ABNORMAL LOW (ref >59)
GLUCOSE: 118 mg/dL — AB (ref 65–99)
Potassium: 4 mmol/L (ref 3.5–5.2)
Sodium: 142 mmol/L (ref 134–144)

## 2017-06-23 NOTE — Progress Notes (Signed)
Thank you, Bruce Green. I think it's okay to restart his Midodrine. I don't think this has anything to do with the atrial fibrillation.  Do not give him Florinef since this will likely put him in heart failure. I agree it's time to decrease his amiodarone to 400 mg once daily, down to 200 mg daily in one more week. MCr

## 2017-06-25 ENCOUNTER — Telehealth: Payer: Self-pay

## 2017-06-25 MED ORDER — MAGNESIUM 400 MG PO CAPS
400.0000 mg | ORAL_CAPSULE | ORAL | Status: DC
Start: 2017-06-25 — End: 2018-06-18

## 2017-06-25 MED ORDER — FUROSEMIDE 40 MG PO TABS: 40.0000 mg | ORAL_TABLET | Freq: Every day | ORAL | 3 refills | Status: DC

## 2017-06-25 MED ORDER — APIXABAN 2.5 MG PO TABS: 2.5000 mg | ORAL_TABLET | Freq: Two times a day (BID) | ORAL | 1 refills | Status: DC

## 2017-06-25 MED ORDER — CARVEDILOL 3.125 MG PO TABS: 3.1250 mg | ORAL_TABLET | Freq: Two times a day (BID) | ORAL | 3 refills | Status: DC

## 2017-06-25 NOTE — Telephone Encounter (Signed)
Notes recorded by Diana Eves, CMA on 06/25/2017 at 1:53 PM EDT Results reviewed with patient's daughter, [name]. Okay per DPR. Reviewed medications/supplements. Refilled medications per [name]'s request to the preferred pharmacy. Recommendations reviewed. Daughter verbalized understanding and agreed with plan.

## 2017-06-25 NOTE — Telephone Encounter (Signed)
[facility] home health nurse calling for pt to get refill of Eliquis and Coreg 30 day supply to [facility] , pt almost out

## 2017-06-26 ENCOUNTER — Telehealth: Payer: Self-pay | Admitting: Cardiovascular Disease

## 2017-06-26 NOTE — Telephone Encounter (Signed)
Received a call from patient's daughter [name].Spoke to [employer] PA she advised jerking is called asterixis.Advised to decrease amiodarone as ordered.Advised to keep appointment with Dr.Croitoru as planned.Advised to call back if needed.

## 2017-06-26 NOTE — Telephone Encounter (Signed)
Bruce Green is calling because she is wanting to know the medical term for the jerking in which Bruce Green is having .  Please call

## 2017-06-26 NOTE — Telephone Encounter (Signed)
Returned call to [place] no answer.[place].

## 2017-07-13 ENCOUNTER — Observation Stay (HOSPITAL_COMMUNITY)
Admission: EM | Admit: 2017-07-13 | Discharge: 2017-07-15 | Disposition: A | Discharge status: Home / Self Care | Payer: Medicare Other | Attending: Family Medicine | Admitting: Family Medicine

## 2017-07-13 ENCOUNTER — Emergency Department (HOSPITAL_COMMUNITY): Payer: Medicare Other

## 2017-07-13 DIAGNOSIS — Z791 Long term (current) use of non-steroidal anti-inflammatories (NSAID): Secondary | ICD-10-CM | POA: Insufficient documentation

## 2017-07-13 DIAGNOSIS — I480 Paroxysmal atrial fibrillation: Secondary | ICD-10-CM | POA: Insufficient documentation

## 2017-07-13 DIAGNOSIS — I714 Abdominal aortic aneurysm, without rupture: Secondary | ICD-10-CM | POA: Insufficient documentation

## 2017-07-13 DIAGNOSIS — K449 Diaphragmatic hernia without obstruction or gangrene: Secondary | ICD-10-CM | POA: Diagnosis not present

## 2017-07-13 DIAGNOSIS — I5022 Chronic systolic (congestive) heart failure: Secondary | ICD-10-CM | POA: Diagnosis not present

## 2017-07-13 DIAGNOSIS — F419 Anxiety disorder, unspecified: Secondary | ICD-10-CM | POA: Diagnosis not present

## 2017-07-13 DIAGNOSIS — Z882 Allergy status to sulfonamides status: Secondary | ICD-10-CM | POA: Diagnosis not present

## 2017-07-13 DIAGNOSIS — Z951 Presence of aortocoronary bypass graft: Secondary | ICD-10-CM | POA: Insufficient documentation

## 2017-07-13 DIAGNOSIS — Z79899 Other long term (current) drug therapy: Secondary | ICD-10-CM | POA: Diagnosis not present

## 2017-07-13 DIAGNOSIS — E785 Hyperlipidemia, unspecified: Secondary | ICD-10-CM | POA: Insufficient documentation

## 2017-07-13 DIAGNOSIS — N390 Urinary tract infection, site not specified: Secondary | ICD-10-CM | POA: Diagnosis present

## 2017-07-13 DIAGNOSIS — R55 Syncope and collapse: Principal | ICD-10-CM | POA: Diagnosis present

## 2017-07-13 DIAGNOSIS — E559 Vitamin D deficiency, unspecified: Secondary | ICD-10-CM | POA: Diagnosis not present

## 2017-07-13 DIAGNOSIS — K222 Esophageal obstruction: Secondary | ICD-10-CM | POA: Diagnosis not present

## 2017-07-13 DIAGNOSIS — K219 Gastro-esophageal reflux disease without esophagitis: Secondary | ICD-10-CM | POA: Insufficient documentation

## 2017-07-13 DIAGNOSIS — N183 Chronic kidney disease, stage 3 (moderate): Secondary | ICD-10-CM | POA: Insufficient documentation

## 2017-07-13 DIAGNOSIS — I255 Ischemic cardiomyopathy: Secondary | ICD-10-CM | POA: Diagnosis present

## 2017-07-13 DIAGNOSIS — I340 Nonrheumatic mitral (valve) insufficiency: Secondary | ICD-10-CM | POA: Diagnosis not present

## 2017-07-13 DIAGNOSIS — Z8673 Personal history of transient ischemic attack (TIA), and cerebral infarction without residual deficits: Secondary | ICD-10-CM | POA: Diagnosis not present

## 2017-07-13 LAB — BASIC METABOLIC PANEL
Anion gap: 7 (ref 5–15)
BUN: 31 mg/dL — ABNORMAL HIGH (ref 6–20)
CHLORIDE: 101 mmol/L (ref 101–111)
CO2: 31 mmol/L (ref 22–32)
Calcium: 8.7 mg/dL — ABNORMAL LOW (ref 8.9–10.3)
Creatinine, Ser: 1.57 mg/dL — ABNORMAL HIGH (ref 0.61–1.24)
GFR calc non Af Amer: 36 mL/min — ABNORMAL LOW (ref >60)
GFR, EST AFRICAN AMERICAN: 41 mL/min — AB (ref >60)
Glucose, Bld: 112 mg/dL — ABNORMAL HIGH (ref 65–99)
POTASSIUM: 4 mmol/L (ref 3.5–5.1)
SODIUM: 139 mmol/L (ref 135–145)

## 2017-07-13 LAB — URINALYSIS, ROUTINE W REFLEX MICROSCOPIC
BILIRUBIN URINE: NEGATIVE
Glucose, UA: NEGATIVE mg/dL
HGB URINE DIPSTICK: NEGATIVE
KETONES UR: NEGATIVE mg/dL
Nitrite: NEGATIVE
PROTEIN: NEGATIVE mg/dL
Specific Gravity, Urine: 1.008 (ref 1.005–1.030)
pH: 6 (ref 5.0–8.0)

## 2017-07-13 LAB — CBG MONITORING, ED: GLUCOSE-CAPILLARY: 111 mg/dL — AB (ref 65–99)

## 2017-07-13 LAB — HEPATIC FUNCTION PANEL
ALBUMIN: 3.4 g/dL — AB (ref 3.5–5.0)
ALK PHOS: 64 U/L (ref 38–126)
ALT: 18 U/L (ref 17–63)
AST: 25 U/L (ref 15–41)
BILIRUBIN TOTAL: 0.8 mg/dL (ref 0.3–1.2)
Bilirubin, Direct: 0.2 mg/dL (ref 0.1–0.5)
Indirect Bilirubin: 0.6 mg/dL (ref 0.3–0.9)
TOTAL PROTEIN: 6 g/dL — AB (ref 6.5–8.1)

## 2017-07-13 LAB — CBC
HEMATOCRIT: 43.7 % (ref 39.0–52.0)
Hemoglobin: 14.3 g/dL (ref 13.0–17.0)
MCH: 28.2 pg (ref 26.0–34.0)
MCHC: 32.7 g/dL (ref 30.0–36.0)
MCV: 86.2 fL (ref 78.0–100.0)
Platelets: 192 10*3/uL (ref 150–400)
RBC: 5.07 MIL/uL (ref 4.22–5.81)
RDW: 16.6 % — ABNORMAL HIGH (ref 11.5–15.5)
WBC: 9.8 10*3/uL (ref 4.0–10.5)

## 2017-07-13 LAB — TROPONIN I: Troponin I: <0.03 ng/mL (ref <0.03)

## 2017-07-13 MED ORDER — POLYETHYLENE GLYCOL 3350 17 G PO PACK: 17.0000 g | PACK | Freq: Every day | ORAL | Status: DC | PRN

## 2017-07-13 MED ORDER — B COMPLEX-C PO TABS
1.0000 | ORAL_TABLET | Freq: Every day | ORAL | Status: DC
  Administered 2017-07-14 – 2017-07-15 (×2): 1 via ORAL
  Filled 2017-07-13 (×3): qty 1

## 2017-07-13 MED ORDER — LORATADINE 10 MG PO TABS
10.0000 mg | ORAL_TABLET | Freq: Every day | ORAL | Status: DC
  Administered 2017-07-14 – 2017-07-15 (×2): 10 mg via ORAL
  Filled 2017-07-13 (×2): qty 1

## 2017-07-13 MED ORDER — APIXABAN 2.5 MG PO TABS
2.5000 mg | ORAL_TABLET | Freq: Every day | ORAL | Status: DC
  Administered 2017-07-14: 2.5 mg via ORAL
  Filled 2017-07-13: qty 1

## 2017-07-13 MED ORDER — DOCUSATE SODIUM 100 MG PO CAPS
100.0000 mg | ORAL_CAPSULE | Freq: Every day | ORAL | Status: DC
  Administered 2017-07-14 – 2017-07-15 (×2): 100 mg via ORAL
  Filled 2017-07-13 (×2): qty 1

## 2017-07-13 MED ORDER — FUROSEMIDE 40 MG PO TABS
40.0000 mg | ORAL_TABLET | Freq: Every day | ORAL | Status: DC
  Administered 2017-07-14 – 2017-07-15 (×2): 40 mg via ORAL
  Filled 2017-07-13 (×2): qty 1

## 2017-07-13 MED ORDER — MAGNESIUM OXIDE 400 (241.3 MG) MG PO TABS
400.0000 mg | ORAL_TABLET | ORAL | Status: DC
  Administered 2017-07-14: 400 mg via ORAL
  Filled 2017-07-13 (×2): qty 1

## 2017-07-13 MED ORDER — LEVOFLOXACIN 750 MG PO TABS: 750.0000 mg | ORAL_TABLET | Freq: Once | ORAL | Status: DC

## 2017-07-13 MED ORDER — SODIUM CHLORIDE 0.9% FLUSH
3.0000 mL | Freq: Two times a day (BID) | INTRAVENOUS | Status: DC
  Administered 2017-07-14 – 2017-07-15 (×2): 3 mL via INTRAVENOUS

## 2017-07-13 MED ORDER — ATORVASTATIN CALCIUM 40 MG PO TABS
40.0000 mg | ORAL_TABLET | Freq: Every day | ORAL | Status: DC
  Administered 2017-07-14 – 2017-07-15 (×2): 40 mg via ORAL
  Filled 2017-07-13 (×3): qty 1

## 2017-07-13 MED ORDER — ADULT MULTIVITAMIN W/MINERALS CH
1.0000 | ORAL_TABLET | Freq: Every day | ORAL | Status: DC
  Administered 2017-07-14 – 2017-07-15 (×2): 1 via ORAL
  Filled 2017-07-13 (×2): qty 1

## 2017-07-13 MED ORDER — ACETAMINOPHEN 500 MG PO TABS: 1000.0000 mg | ORAL_TABLET | Freq: Four times a day (QID) | ORAL | Status: DC | PRN

## 2017-07-13 MED ORDER — TRAZODONE HCL 50 MG PO TABS
50.0000 mg | ORAL_TABLET | ORAL | Status: DC | PRN
  Administered 2017-07-14 – 2017-07-15 (×2): 50 mg via ORAL
  Filled 2017-07-13 (×2): qty 1

## 2017-07-13 MED ORDER — VITAMIN D 1000 UNITS PO TABS
5000.0000 [IU] | ORAL_TABLET | ORAL | Status: DC
  Administered 2017-07-15: 5000 [IU] via ORAL
  Filled 2017-07-13: qty 5

## 2017-07-13 MED ORDER — PANTOPRAZOLE SODIUM 40 MG PO TBEC
40.0000 mg | DELAYED_RELEASE_TABLET | Freq: Every day | ORAL | Status: DC
  Administered 2017-07-14 – 2017-07-15 (×2): 40 mg via ORAL
  Filled 2017-07-13 (×2): qty 1

## 2017-07-13 MED ORDER — FOLIC ACID 1 MG PO TABS
1.0000 mg | ORAL_TABLET | Freq: Every day | ORAL | Status: DC
  Administered 2017-07-14 – 2017-07-15 (×2): 1 mg via ORAL
  Filled 2017-07-13 (×2): qty 1

## 2017-07-13 MED ORDER — CARVEDILOL 3.125 MG PO TABS
3.1250 mg | ORAL_TABLET | Freq: Two times a day (BID) | ORAL | Status: DC
  Administered 2017-07-14: 3.125 mg via ORAL
  Filled 2017-07-13 (×2): qty 1

## 2017-07-13 MED ORDER — POTASSIUM CHLORIDE CRYS ER 20 MEQ PO TBCR
20.0000 meq | EXTENDED_RELEASE_TABLET | Freq: Every day | ORAL | Status: DC
  Administered 2017-07-14 – 2017-07-15 (×2): 20 meq via ORAL
  Filled 2017-07-13 (×2): qty 1

## 2017-07-13 MED ORDER — TAMSULOSIN HCL 0.4 MG PO CAPS
0.4000 mg | ORAL_CAPSULE | Freq: Every day | ORAL | Status: DC
  Filled 2017-07-13: qty 1

## 2017-07-13 MED ORDER — AMIODARONE HCL 200 MG PO TABS
200.0000 mg | ORAL_TABLET | Freq: Every day | ORAL | Status: DC
  Administered 2017-07-14 – 2017-07-15 (×2): 200 mg via ORAL
  Filled 2017-07-13 (×2): qty 1

## 2017-07-13 MED ORDER — DEXTROSE 5 % IV SOLN
1.0000 g | INTRAVENOUS | Status: DC
  Administered 2017-07-13 – 2017-07-14 (×2): 1 g via INTRAVENOUS
  Filled 2017-07-13 (×3): qty 10

## 2017-07-13 NOTE — ED Triage Notes (Signed)
Patient comes in per [facility] with syncope. Fm home. HH aid. Walking with walker and +LOC. Caught by aid so did not fall. Aid states pt became cyanotic. LOC for 30 secs. Happened before. Hx afib, hypotension. 102/44 bp. Not orthostatic. HR 60, SR on ekg. No c/o dizziness. 20 LH. A/ox4. Pt on amiodarone and Cardizem that have been adjusted recently. cbg 98.

## 2017-07-13 NOTE — ED Provider Notes (Signed)
[facility] DEPT Provider Note   CSN: [id number] Arrival date & time: 07/13/17  1532     History   Chief Complaint Chief Complaint  Patient presents with  . Loss of Consciousness    HPI Bruce Green is a 83 y.o. male.  83yo M w/ extensive PMH including CHF, A fib on Eliquis, CKD, TIA, CABG who p/w syncope. This afternoon, caregiver reports that she was walking with him to the restroom when he had a sudden syncopal episode. She was able to lower him down to the ground with assistance and he did not strike his head. She reports approximately 32nd loss of consciousness and that he turned blue. He then spontaneously regained consciousness. Daughter notes that he had a near syncopal episode 2 days ago also while walking. He had a normal morning this morning, normal eating and drinking. No vomiting, fevers, or recent illness. He has had a mild cough recently but no significant cough. He denies any chest pain or shortness of breath. They follow his daily weights and he has not had any recent weight gain. He saw his cardiologist recently and they decreased his dose of amiodarone, held his Lasix for one day and then resumed as usual.   The history is provided by the patient, a relative and a caregiver.  Loss of Consciousness      Past Medical History:  Diagnosis Date  . Anxiety   . Cataract   . CKD (chronic kidney disease)   . Colon polyp   . Coronary artery disease 04/21/2014     severe triple vessel  . Dyslipidemia   . GERD (gastroesophageal reflux disease)   . Hiatal hernia    6cm  . Ischemic cardiomyopathy 03/2014   EF 25-30%  . Pneumonia   . Stricture esophagus    distal  . TIA (transient ischemic attack)   . Vitamin D deficiency     Patient Active Problem List   Diagnosis Date Noted  . AAA (abdominal aortic aneurysm) without rupture ([place]) 05/14/2017  . CAP (community acquired pneumonia) 05/13/2017  . Gross hematuria 05/13/2017  . Elevated LFTs 05/13/2017  .  Hyperbilirubinemia 05/13/2017  . Pain in joint of left shoulder 05/13/2017  . S/P CABG (coronary artery bypass graft) 06/08/2014  . Orthostatic hypotension 06/08/2014  . SOB (shortness of breath) 05/17/2014  . Eye infection 05/17/2014  . CAD (coronary artery disease) 04/28/2014  . Cardiomyopathy, ischemic 04/25/2014  . S/P CABG x 3 04/24/2014  . Cardiomyopathy of undetermined type ([place]) 04/19/2014  . Systolic CHF with reduced left ventricular function, NYHA class 2 ([place]) 04/19/2014  . Dyspnea on exertion 04/10/2014  . Murmur 04/10/2014  . Cardiomegaly 04/10/2014  . Coronary artery calcification seen on CAT scan 04/10/2014  . GERD with stricture 04/09/2012    Past Surgical History:  Procedure Laterality Date  . APPENDECTOMY    . CARDIAC SURGERY    . CARDIOVERSION N/A 05/19/2017   Procedure: CARDIOVERSION;  Surgeon: Larey Dresser, MD;  Location: [facility] ENDOSCOPY;  Service: Cardiovascular;  Laterality: N/A;  . CHOLECYSTECTOMY    . CORONARY ARTERY BYPASS GRAFT N/A 04/24/2014   Procedure: CORONARY ARTERY BYPASS GRAFTING (CABG);  Surgeon: Gaye Pollack, MD;  Location: [place];  Service: Open Heart Surgery;  Laterality: N/A;  Times 3 using left internal mammary artery and endoscopically harvested right saphenous vein  . ESOPHAGOGASTRODUODENOSCOPY  04/09/2012   Procedure: ESOPHAGOGASTRODUODENOSCOPY (EGD);  Surgeon: Gatha Mayer, MD;  Location: [facility] ENDOSCOPY;  Service: Endoscopy;  Laterality: N/A;  .  INTRAOPERATIVE TRANSESOPHAGEAL ECHOCARDIOGRAM N/A 04/24/2014   Procedure: INTRAOPERATIVE TRANSESOPHAGEAL ECHOCARDIOGRAM;  Surgeon: Gaye Pollack, MD;  Location: [facility] OR;  Service: Open Heart Surgery;  Laterality: N/A;  . LEFT AND RIGHT HEART CATHETERIZATION WITH CORONARY ANGIOGRAM N/A 04/21/2014   Procedure: LEFT AND RIGHT HEART CATHETERIZATION WITH CORONARY ANGIOGRAM;  Surgeon: Burnell Blanks, MD;  Location: [facility] CATH LAB;  Service: Cardiovascular;  Laterality: N/A;  . SHOULDER SURGERY    . TEE  WITHOUT CARDIOVERSION N/A 05/19/2017   Procedure: TRANSESOPHAGEAL ECHOCARDIOGRAM (TEE);  Surgeon: Larey Dresser, MD;  Location: [facility] ENDOSCOPY;  Service: Cardiovascular;  Laterality: N/A;       Home Medications    Prior to Admission medications   Medication Sig Start Date End Date Taking? Authorizing Provider  acetaminophen (TYLENOL) 325 MG tablet Take 650 mg by mouth every 6 (six) hours as needed for moderate pain.    [provider]  amiodarone (PACERONE) 200 MG tablet Take 1 tablet (200 mg total) by mouth 2 (two) times daily. 200MG  BID X2 WEEKS THEN DAILY 06/22/17   Croitoru, Mihai, MD  apixaban (ELIQUIS) 2.5 MG TABS tablet Take 1 tablet (2.5 mg total) by mouth 2 (two) times daily. 06/25/17   Croitoru, Mihai, MD  atorvastatin (LIPITOR) 80 MG tablet Take 40 mg by mouth daily.    [provider]  B Complex-C (B-COMPLEX WITH VITAMIN C) tablet Take 1 tablet by mouth daily.    [provider]  carvedilol (COREG) 3.125 MG tablet Take 1 tablet (3.125 mg total) by mouth 2 (two) times daily with a meal. 06/25/17   Croitoru, Mihai, MD  Cholecalciferol (VITAMIN D3) 5000 units TABS Take 1 tablet by mouth every other day.    [provider]  docusate sodium (COLACE) 100 MG capsule Take 100 mg by mouth daily.    [provider]  folic acid (FOLVITE) 1 MG tablet Take 400 mcg by mouth daily.  04/28/14   Nani Skillern, PA-C  furosemide (LASIX) 40 MG tablet Take 1 tablet (40 mg total) by mouth daily. 06/25/17   Croitoru, Mihai, MD  GUAIFENESIN 1200 PO Take 1,200 mg by mouth.    [provider]  loratadine (CLARITIN) 10 MG tablet Take 1 tablet by mouth daily.    [provider]  Magnesium 400 MG CAPS Take 400 mg by mouth every other day. 06/25/17   Croitoru, Mihai, MD  Multiple Vitamins-Minerals (MULTIVITAMIN WITH MINERALS) tablet Take 1 tablet by mouth daily.    [provider]  pantoprazole (PROTONIX) 40 MG tablet TAKE 1 TABLET (40 MG  TOTAL) BY MOUTH DAILY BEFORE BREAKFAST. 05/11/17   Gatha Mayer, MD  polyethylene glycol [facility] / Floria Raveling) packet Take 17 g by mouth daily as needed for mild constipation. 05/21/17   Bonnielee Haff, MD  potassium chloride SA (K-DUR,KLOR-CON) 20 MEQ tablet Take 1 tablet (20 mEq total) by mouth daily. TAKE WHEN TAKING YOUR LASIX 06/22/17   Croitoru, Mihai, MD  Probiotic Product (PROBIOTIC PO) Take 1 tablet by mouth daily.    [provider]  tamsulosin (FLOMAX) 0.4 MG CAPS capsule Take 1 capsule (0.4 mg total) by mouth daily after supper. 05/21/17   Bonnielee Haff, MD    Family History Family History  Problem Relation Age of Onset  . Heart attack Mother   . Kidney disease Father   . Colon cancer Brother   . Dementia Brother   . Aneurysm Brother   . Breast cancer Sister  x 2  . Arthritis Sister   . Osteoporosis Sister     Social History Social History  Substance Use Topics  . Smoking status: Never Smoker  . Smokeless tobacco: Never Used  . Alcohol use 0.6 oz/week    1 Standard drinks or equivalent per week     Comment: rare     Allergies   Sulfa antibiotics   Review of Systems Review of Systems  Cardiovascular: Positive for syncope.   All other systems reviewed and are negative except that which was mentioned in HPI   Physical Exam Updated Vital Signs BP (!) 147/61   Pulse (!) 55   Temp (!) 97.5 F (36.4 C) (Oral)   Resp 16   Ht 5\' 10"  (1.778 m)   Wt 64.9 kg (143 lb)   SpO2 99%   BMI 20.52 kg/m   Physical Exam  Constitutional: He appears well-developed and well-nourished. No distress.  HENT:  Head: Normocephalic and atraumatic.  Mouth/Throat: Oropharynx is clear and moist.  Moist mucous membranes  Eyes: Conjunctivae are normal.  Neck: Neck supple.  Cardiovascular: Normal rate, regular rhythm and normal heart sounds.   No murmur heard. Pulmonary/Chest: Effort normal and breath sounds normal.  Abdominal: Soft. Bowel sounds are normal.  He exhibits no distension. There is no tenderness.  Musculoskeletal: He exhibits no edema.  Neurological: He is alert.  Fluent speech, oriented to person and place, occasionally confused during conversation  Skin: Skin is warm and dry.  Psychiatric: He has a normal mood and affect.  Nursing note and vitals reviewed.    ED Treatments / Results  Labs (all labs ordered are listed, but only abnormal results are displayed) Labs Reviewed  BASIC METABOLIC PANEL - Abnormal; Notable for the following:       Result Value   Glucose, Bld 112 (*)    BUN 31 (*)    Creatinine, Ser 1.57 (*)    Calcium 8.7 (*)    GFR calc non Af Amer 36 (*)    GFR calc Af Amer 41 (*)    All other components within normal limits  CBC - Abnormal; Notable for the following:    RDW 16.6 (*)    All other components within normal limits  URINALYSIS, ROUTINE W REFLEX MICROSCOPIC - Abnormal; Notable for the following:    APPearance HAZY (*)    Leukocytes, UA LARGE (*)    Bacteria, UA MANY (*)    Squamous Epithelial / LPF 0-5 (*)    All other components within normal limits  CBG MONITORING, ED - Abnormal; Notable for the following:    Glucose-Capillary 111 (*)    All other components within normal limits  TROPONIN I  HEPATIC FUNCTION PANEL    EKG  EKG Interpretation  Date/Time:  Monday July 13 2017 15:44:16 EDT Ventricular Rate:  56 PR Interval:    QRS Duration: 155 QT Interval:  539 QTC Calculation: 521 R Axis:   16 Text Interpretation:  Sinus rhythm Borderline prolonged PR interval IVCD, consider atypical LBBB Artifact T wave abnormality Abnormal ekg Confirmed by Carmin Muskrat [phone]) on 07/13/2017 3:48:44 PM Also confirmed by Carmin Muskrat ([id number]), editor Laurena Spies [phone])  on 07/13/2017 3:56:56 PM       Radiology Dg Chest 2 View  Result Date: 07/13/2017 CLINICAL DATA:  Syncope with brief loss of consciousness for 30 seconds. Cyanosis. EXAM: CHEST  2 VIEW COMPARISON:  05/20/2017  FINDINGS: Status post CABG with stable cardiomegaly and aortic atherosclerosis. Upper lobe predominant  emphysematous hyperinflation of the lungs with crowding of lower lobe interstitial lung markings. Slightly more confluent airspace opacity abutting the minor fissure involving the right upper lobe. A small focus of pneumonia is not excluded versus atelectasis (favored). Osteoarthritis of both glenohumeral and AC joints. Thoracic spondylosis. IMPRESSION: 1. Cardiomegaly with upper lobe predominant emphysematous hyperinflation of the lungs. 2. Slightly more confluent airspace opacity involving the right upper lobe adjacent to the minor fissure cannot exclude a small focus of pneumonia versus atelectasis. Electronically Signed   By: Ashley Royalty M.D.   On: 07/13/2017 18:14    Procedures Procedures (including critical care time)  Medications Ordered in ED Medications - No data to display   Initial Impression / Assessment and Plan / ED Course  I have reviewed the triage vital signs and the nursing notes.  Pertinent labs & imaging results that were available during my care of the patient were reviewed by me and considered in my medical decision making (see chart for details).    PT w/ witnessed syncopal episode while walking today, no complaints on exam. Reassuring VS on arrival. EKG shows sinus Rhythm with no ischemic changes. He occasionally became confused during exam but was awake and alert the entire time. Daughter notes that he is usually pretty lucid. Lab work today shows negative troponin, creatinine 1.57 similar to previous, normal CBC, UA with large leukocytes, many wbc's and bacteria. His chest x-ray has a questionable small area of pneumonia versus atelectasis in right upper lobe. I discussed admission for syncope workup with Dr. Alcario Drought, who will give dose of CTX to cover both UTI and questionable pneumonia. PT admitted for further treatment.  Final Clinical Impressions(s) / ED Diagnoses    Final diagnoses:  None    New Prescriptions New Prescriptions   No medications on file     Madoline Bhatt, Wenda Overland, MD 07/13/17 2015

## 2017-07-13 NOTE — ED Notes (Signed)
Patient to xray.

## 2017-07-13 NOTE — H&P (Signed)
History and Physical    Bruce Green ZSW:[id number] DOB: 01-05-1919 DOA: 07/13/2017  PCP: Kathyrn Lass, MD  Patient coming from: Home  I have personally briefly reviewed patient's old medical records in [facility]  Chief Complaint: Syncope  HPI: Bruce Green is a 83 y.o. male with medical history significant of ICM, CABG in 2015, CHF with EF 25-30%, Severe MR (most recent echo was TEE in June).  Patient presents to the ED after syncopal episode at home today.  Was walking with walker, had syncope, caught by aid so didn't have fall.  Aid states patient became cyanotic.  This also happened on either Friday or Sat over the weekend.  Patient on amiodarone and cardizem both of which were adjusted recently for A.Fib.  Also had cardioversion recently.  No fever, has productive cough that is chronic and unchanged, no SOB, no CP, no dysuria.  ED Course: CXR suspicious for PNA but no WBC, no fever, satting 97% on RA.  UA is suspicious for UTI.   Review of Systems: As per HPI otherwise 10 point review of systems negative.   Past Medical History:  Diagnosis Date  . Anxiety   . Cataract   . CKD (chronic kidney disease)   . Colon polyp   . Coronary artery disease 04/21/2014     severe triple vessel  . Dyslipidemia   . GERD (gastroesophageal reflux disease)   . Hiatal hernia    6cm  . Ischemic cardiomyopathy 03/2014   EF 25-30%  . Pneumonia   . Stricture esophagus    distal  . TIA (transient ischemic attack)   . Vitamin D deficiency     Past Surgical History:  Procedure Laterality Date  . APPENDECTOMY    . CARDIAC SURGERY    . CARDIOVERSION N/A 05/19/2017   Procedure: CARDIOVERSION;  Surgeon: Larey Dresser, MD;  Location: [facility] ENDOSCOPY;  Service: Cardiovascular;  Laterality: N/A;  . CHOLECYSTECTOMY    . CORONARY ARTERY BYPASS GRAFT N/A 04/24/2014   Procedure: CORONARY ARTERY BYPASS GRAFTING (CABG);  Surgeon: Gaye Pollack, MD;  Location: [place];  Service: Open Heart  Surgery;  Laterality: N/A;  Times 3 using left internal mammary artery and endoscopically harvested right saphenous vein  . ESOPHAGOGASTRODUODENOSCOPY  04/09/2012   Procedure: ESOPHAGOGASTRODUODENOSCOPY (EGD);  Surgeon: Gatha Mayer, MD;  Location: [facility] ENDOSCOPY;  Service: Endoscopy;  Laterality: N/A;  . INTRAOPERATIVE TRANSESOPHAGEAL ECHOCARDIOGRAM N/A 04/24/2014   Procedure: INTRAOPERATIVE TRANSESOPHAGEAL ECHOCARDIOGRAM;  Surgeon: Gaye Pollack, MD;  Location: [facility] OR;  Service: Open Heart Surgery;  Laterality: N/A;  . LEFT AND RIGHT HEART CATHETERIZATION WITH CORONARY ANGIOGRAM N/A 04/21/2014   Procedure: LEFT AND RIGHT HEART CATHETERIZATION WITH CORONARY ANGIOGRAM;  Surgeon: Burnell Blanks, MD;  Location: [facility] CATH LAB;  Service: Cardiovascular;  Laterality: N/A;  . SHOULDER SURGERY    . TEE WITHOUT CARDIOVERSION N/A 05/19/2017   Procedure: TRANSESOPHAGEAL ECHOCARDIOGRAM (TEE);  Surgeon: Larey Dresser, MD;  Location: [facility] ENDOSCOPY;  Service: Cardiovascular;  Laterality: N/A;     reports that he has never smoked. He has never used smokeless tobacco. He reports that he drinks about 0.6 oz of alcohol per week . He reports that he does not use drugs.  Allergies  Allergen Reactions  . Sulfa Antibiotics Rash    Family History  Problem Relation Age of Onset  . Heart attack Mother   . Kidney disease Father   . Colon cancer Brother   . Dementia Brother   .  Aneurysm Brother   . Breast cancer Sister        x 2  . Arthritis Sister   . Osteoporosis Sister      Prior to Admission medications   Medication Sig Start Date End Date Taking? Authorizing Provider  acetaminophen (TYLENOL) 500 MG tablet Take 1,000 mg by mouth every 6 (six) hours as needed for moderate pain.    Yes [provider]  amiodarone (PACERONE) 200 MG tablet Take 1 tablet (200 mg total) by mouth 2 (two) times daily. 200MG  BID X2 WEEKS THEN DAILY Patient taking differently: Take 200 mg by mouth daily.  06/22/17   Yes Croitoru, Mihai, MD  apixaban (ELIQUIS) 2.5 MG TABS tablet Take 1 tablet (2.5 mg total) by mouth 2 (two) times daily. Patient taking differently: Take 2.5 mg by mouth daily.  06/25/17  Yes Croitoru, Mihai, MD  atorvastatin (LIPITOR) 80 MG tablet Take 40 mg by mouth daily.   Yes [provider]  B Complex-C (B-COMPLEX WITH VITAMIN C) tablet Take 1 tablet by mouth daily.   Yes [provider]  carvedilol (COREG) 3.125 MG tablet Take 1 tablet (3.125 mg total) by mouth 2 (two) times daily with a meal. 06/25/17  Yes Croitoru, Mihai, MD  Cholecalciferol (VITAMIN D3) 5000 units TABS Take 1 tablet by mouth every other day.   Yes [provider]  docusate sodium (COLACE) 100 MG capsule Take 100 mg by mouth daily.   Yes [provider]  folic acid (FOLVITE) 1 MG tablet Take 400 mcg by mouth daily.  04/28/14  Yes Lars Pinks M, PA-C  furosemide (LASIX) 40 MG tablet Take 1 tablet (40 mg total) by mouth daily. 06/25/17  Yes Croitoru, Mihai, MD  loratadine (CLARITIN) 10 MG tablet Take 1 tablet by mouth daily.   Yes [provider]  Magnesium 400 MG CAPS Take 400 mg by mouth every other day. 06/25/17  Yes Croitoru, Mihai, MD  Melatonin 5 MG TABS Take 5-10 mg by mouth as needed (sleep).   Yes [provider]  Multiple Vitamins-Minerals (MULTIVITAMIN WITH MINERALS) tablet Take 1 tablet by mouth daily.   Yes [provider]  pantoprazole (PROTONIX) 40 MG tablet TAKE 1 TABLET (40 MG TOTAL) BY MOUTH DAILY BEFORE BREAKFAST. 05/11/17  Yes Gatha Mayer, MD  polyethylene glycol [facility] / Floria Raveling) packet Take 17 g by mouth daily as needed for mild constipation. 05/21/17  Yes Bonnielee Haff, MD  potassium chloride SA (K-DUR,KLOR-CON) 20 MEQ tablet Take 1 tablet (20 mEq total) by mouth daily. TAKE WHEN TAKING YOUR LASIX 06/22/17  Yes Croitoru, Mihai, MD  Probiotic Product (PROBIOTIC PO) Take 1 tablet by mouth daily.   Yes [provider]  tamsulosin  (FLOMAX) 0.4 MG CAPS capsule Take 1 capsule (0.4 mg total) by mouth daily after supper. 05/21/17  Yes Bonnielee Haff, MD  traZODone (DESYREL) 50 MG tablet Take 50-100 mg by mouth as needed for sleep. 07/03/17  Yes [provider]    Physical Exam: Vitals:   07/13/17 1830 07/13/17 1845 07/13/17 1900 07/13/17 1915  BP: (!) 132/51 (!) 133/53 (!) 117/53 (!) 133/56  Pulse: (!) 51 (!) 53 (!) 53 (!) 54  Resp: 19 (!) 26 18 20   Temp:      TempSrc:      SpO2: 97% 97% 99% 96%  Weight:      Height:        Constitutional: NAD, calm, comfortable Eyes: PERRL, lids and conjunctivae normal ENMT: Mucous membranes are  moist. Posterior pharynx clear of any exudate or lesions.Normal dentition.  Neck: normal, supple, no masses, no thyromegaly Respiratory: Wet cough sound. Cardiovascular: Murmur present Abdomen: no tenderness, no masses palpated. No hepatosplenomegaly. Bowel sounds positive.  Musculoskeletal: no clubbing / cyanosis. No joint deformity upper and lower extremities. Good ROM, no contractures. Normal muscle tone.  Skin: no rashes, lesions, ulcers. No induration Neurologic: CN 2-12 grossly intact. Sensation intact, DTR normal. Strength 5/5 in all 4.  Psychiatric: Normal judgment and insight. Alert and oriented x 3. Normal mood.    Labs on Admission: I have personally reviewed following labs and imaging studies  CBC:  Recent Labs Lab 07/13/17 1605  WBC 9.8  HGB 14.3  HCT 43.7  MCV 86.2  PLT 409   Basic Metabolic Panel:  Recent Labs Lab 07/13/17 1605  NA 139  K 4.0  CL 101  CO2 31  GLUCOSE 112*  BUN 31*  CREATININE 1.57*  CALCIUM 8.7*   GFR: Estimated Creatinine Clearance: 25.8 mL/min (A) (by C-G formula based on SCr of 1.57 mg/dL (H)). Liver Function Tests: No results for input(s): AST, ALT, ALKPHOS, BILITOT, PROT, ALBUMIN in the last 168 hours. No results for input(s): LIPASE, AMYLASE in the last 168 hours. No results for input(s): AMMONIA in the last 168  hours. Coagulation Profile: No results for input(s): INR, PROTIME in the last 168 hours. Cardiac Enzymes:  Recent Labs Lab 07/13/17 1659  TROPONINI <0.03   BNP (last 3 results) No results for input(s): PROBNP in the last 8760 hours. HbA1C: No results for input(s): HGBA1C in the last 72 hours. CBG:  Recent Labs Lab 07/13/17 1609  GLUCAP 111*   Lipid Profile: No results for input(s): CHOL, HDL, LDLCALC, TRIG, CHOLHDL, LDLDIRECT in the last 72 hours. Thyroid Function Tests: No results for input(s): TSH, T4TOTAL, FREET4, T3FREE, THYROIDAB in the last 72 hours. Anemia Panel: No results for input(s): VITAMINB12, FOLATE, FERRITIN, TIBC, IRON, RETICCTPCT in the last 72 hours. Urine analysis:    Component Value Date/Time   COLORURINE YELLOW 07/13/2017 1740   APPEARANCEUR HAZY (A) 07/13/2017 1740   LABSPEC 1.008 07/13/2017 1740   PHURINE 6.0 07/13/2017 1740   GLUCOSEU NEGATIVE 07/13/2017 1740   HGBUR NEGATIVE 07/13/2017 1740   BILIRUBINUR NEGATIVE 07/13/2017 1740   KETONESUR NEGATIVE 07/13/2017 1740   PROTEINUR NEGATIVE 07/13/2017 1740   UROBILINOGEN 1.0 04/24/2014 0108   NITRITE NEGATIVE 07/13/2017 1740   LEUKOCYTESUR LARGE (A) 07/13/2017 1740    Radiological Exams on Admission: Dg Chest 2 View  Result Date: 07/13/2017 CLINICAL DATA:  Syncope with brief loss of consciousness for 30 seconds. Cyanosis. EXAM: CHEST  2 VIEW COMPARISON:  05/20/2017 FINDINGS: Status post CABG with stable cardiomegaly and aortic atherosclerosis. Upper lobe predominant emphysematous hyperinflation of the lungs with crowding of lower lobe interstitial lung markings. Slightly more confluent airspace opacity abutting the minor fissure involving the right upper lobe. A small focus of pneumonia is not excluded versus atelectasis (favored). Osteoarthritis of both glenohumeral and AC joints. Thoracic spondylosis. IMPRESSION: 1. Cardiomegaly with upper lobe predominant emphysematous hyperinflation of the lungs.  2. Slightly more confluent airspace opacity involving the right upper lobe adjacent to the minor fissure cannot exclude a small focus of pneumonia versus atelectasis. Electronically Signed   By: Ashley Royalty M.D.   On: 07/13/2017 18:14    EKG: Independently reviewed.  Assessment/Plan Principal Problem:   Syncope Active Problems:   Cardiomyopathy, ischemic   Mitral regurgitation   Acute lower UTI    1. Syncope -  1. Syncope pathway 2. Doubt PNA given nl O2 sats at rest, no fever, no WBC 3. Has numerous cardiac risk factors (see below) including severe MR and ICM 4. Call cards in AM 2. UTI - 1. Rocephin 2. Cultures pending 3. MR - severe on recent echo 4. ICM - with EF of 25-30% 1. Continue home meds  DVT prophylaxis: Eliquis Code Status: Full Family Communication: Family at bedside Disposition Plan: Home after admit Consults called: None Admission status: Place in [place], [place] Hospitalists Pager [phone]  If 7AM-7PM, please contact day team taking care of patient [email or web address] Password [email or web address]  07/13/2017, 8:07 PM

## 2017-07-14 ENCOUNTER — Encounter (HOSPITAL_COMMUNITY): Payer: Self-pay

## 2017-07-14 DIAGNOSIS — N390 Urinary tract infection, site not specified: Secondary | ICD-10-CM | POA: Diagnosis not present

## 2017-07-14 DIAGNOSIS — I951 Orthostatic hypotension: Secondary | ICD-10-CM | POA: Diagnosis not present

## 2017-07-14 DIAGNOSIS — R55 Syncope and collapse: Secondary | ICD-10-CM | POA: Diagnosis present

## 2017-07-14 DIAGNOSIS — I340 Nonrheumatic mitral (valve) insufficiency: Secondary | ICD-10-CM | POA: Diagnosis not present

## 2017-07-14 DIAGNOSIS — I255 Ischemic cardiomyopathy: Secondary | ICD-10-CM | POA: Diagnosis not present

## 2017-07-14 LAB — BASIC METABOLIC PANEL
Anion gap: 7 (ref 5–15)
BUN: 28 mg/dL — AB (ref 6–20)
CALCIUM: 8.6 mg/dL — AB (ref 8.9–10.3)
CHLORIDE: 103 mmol/L (ref 101–111)
CO2: 30 mmol/L (ref 22–32)
CREATININE: 1.45 mg/dL — AB (ref 0.61–1.24)
GFR, EST AFRICAN AMERICAN: 46 mL/min — AB (ref >60)
GFR, EST NON AFRICAN AMERICAN: 39 mL/min — AB (ref >60)
Glucose, Bld: 112 mg/dL — ABNORMAL HIGH (ref 65–99)
Potassium: 3.9 mmol/L (ref 3.5–5.1)
SODIUM: 140 mmol/L (ref 135–145)

## 2017-07-14 LAB — CBC
HCT: 41.1 % (ref 39.0–52.0)
HEMOGLOBIN: 13.2 g/dL (ref 13.0–17.0)
MCH: 27.8 pg (ref 26.0–34.0)
MCHC: 32.1 g/dL (ref 30.0–36.0)
MCV: 86.5 fL (ref 78.0–100.0)
PLATELETS: 188 10*3/uL (ref 150–400)
RBC: 4.75 MIL/uL (ref 4.22–5.81)
RDW: 16.5 % — AB (ref 11.5–15.5)
WBC: 9.6 10*3/uL (ref 4.0–10.5)

## 2017-07-14 MED ORDER — MIDODRINE HCL 5 MG PO TABS
5.0000 mg | ORAL_TABLET | Freq: Three times a day (TID) | ORAL | Status: DC
  Administered 2017-07-15 (×2): 5 mg via ORAL
  Filled 2017-07-14 (×3): qty 1

## 2017-07-14 MED ORDER — APIXABAN 2.5 MG PO TABS
2.5000 mg | ORAL_TABLET | Freq: Two times a day (BID) | ORAL | Status: DC
  Administered 2017-07-14 – 2017-07-15 (×2): 2.5 mg via ORAL
  Filled 2017-07-14 (×2): qty 1

## 2017-07-14 NOTE — Progress Notes (Signed)
Pt admitted to 4E04. VSS, no c/o SOB or pain. Hooked to tele, verified x2. Bed alarm on. Call bell within reach. Pt updated on plan of care and report given to next shift.

## 2017-07-14 NOTE — Care Management Obs Status (Signed)
[facility] NOTIFICATION   Patient Details  Name: Bruce Green MRN: [id number] Date of Birth: 11-Jul-1919   Medicare Observation Status Notification Given:  Yes    Dawayne Patricia, RN 07/14/2017, 4:31 PM

## 2017-07-14 NOTE — ED Notes (Signed)
Pt's home health aide came to desk to see if this RN could check his breathing.  The patient is laying on his side, attempting to sleep.  Lung sounds are unchanged, fine crackles on the Right, consistent with chest x-ray.  Pt's crackles have become audible to family due to the position change.  Spoke with family about this.

## 2017-07-14 NOTE — Care Management CC44 (Signed)
Condition Code 44 Documentation Completed  Patient Details  Name: GRAYLIN SPERLING MRN: [id number] Date of Birth: 1919/08/14   Condition Code 44 given:  Yes Patient signature on Condition Code 44 notice:  Yes Documentation of 2 MD's agreement:  Yes Code 44 added to claim:  Yes    Dawayne Patricia, RN 07/14/2017, 4:31 PM

## 2017-07-14 NOTE — ED Notes (Addendum)
Pt changed and repositioned.  Offered something for sleep.

## 2017-07-14 NOTE — Consult Note (Signed)
Cardiology Consultation:   Patient ID: Bruce Green; [id number]; August 14, 1919   Admit date: 07/13/2017 Date of Consult: 07/14/2017  Primary Care Provider: Kathyrn Lass, MD Primary Cardiologist: Dr. Sallyanne Kuster  Patient Profile:   Bruce Green is a 83 y.o. male with a hx of CAD s/p CABG in 2016 with improved EF 45%, HTN, GERD, PNA abnormal LFTswho is being seen today for the evaluation of syncope at the request of Dr. Wynetta Emery.  History of Present Illness:   Mr. Cutting presented to the hospital yesterday after syncopal episode at home. He was walking with a walker, had near syncope and was caught by an aid so didn't fall. The patient tells me that he has been dizzy with standing up from his rocking chair within the last 2 weeks. He thinks that he tends to move too fast. He also has dizziness upon standing more so in the morning upon first rising. He denies any chest pain, shortness of breath, orthopnea, PND.   The patient was recently admitted to the hospital 05/13/17-05/21/17 with hematuria, rapid afib, CBD stone (ERCP deferred until can come off anticoagulation), PNA. Long QTC was felt to be secondary to amiodarone.  A TEE/DCCV was done on 05/19/17. He was seen in follow up on 06/22/17 by Rosaria Ferries, PA at which time he was getting light headed and dizzy with standing and walking. He could only walk 5 feet without having to stop. He had previously been taken off midodrine as was thought to contribute to his afib with rapid rate. In setting of orthostatic hypotension his lasix was held for a day and restarted at a lowe dose. KDur was added. He was also restarted on Midodrine and his amiodarone dose was decreased to 200 mg bid from 400 mg bid.   The patient does not know his medications as his daughter handles his meds. I will check with her to see if any of the prescribed changes were initiated. I called the daughter who did decrease his lasix to 40 mg daily with extra dose as needed for  swelling. She was unaware or adding the midodrine back. The patient is now tapered down to amiodarone 20 mg daily. She says that pt sways when he rises too quickly and after 10-15 feet he almost falls. She says that he has never actually fallen.   Past Medical History:  Diagnosis Date  . Anxiety   . Cataract   . CKD (chronic kidney disease)   . Colon polyp   . Coronary artery disease 04/21/2014     severe triple vessel  . Dyslipidemia   . GERD (gastroesophageal reflux disease)   . Hiatal hernia    6cm  . Ischemic cardiomyopathy 03/2014   EF 25-30%  . Pneumonia   . Stricture esophagus    distal  . TIA (transient ischemic attack)   . Vitamin D deficiency     Past Surgical History:  Procedure Laterality Date  . APPENDECTOMY    . CARDIAC SURGERY    . CARDIOVERSION N/A 05/19/2017   Procedure: CARDIOVERSION;  Surgeon: Larey Dresser, MD;  Location[facility] ENDOSCOPY;  Service: Cardiovascular;  Laterality: N/A;  . CHOLECYSTECTOMY    . CORONARY ARTERY BYPASS GRAFT N/A 04/24/2014   Procedure: CORONARY ARTERY BYPASS GRAFTING (CABG);  Surgeon: Gaye Pollack, MD;  Location: [place];  Service: Open Heart Surgery;  Laterality: N/A;  Times 3 using left internal mammary artery and endoscopically harvested right saphenous vein  . ESOPHAGOGASTRODUODENOSCOPY  04/09/2012  Procedure: ESOPHAGOGASTRODUODENOSCOPY (EGD);  Surgeon: Gatha Mayer, MD;  Location: [facility] ENDOSCOPY;  Service: Endoscopy;  Laterality: N/A;  . INTRAOPERATIVE TRANSESOPHAGEAL ECHOCARDIOGRAM N/A 04/24/2014   Procedure: INTRAOPERATIVE TRANSESOPHAGEAL ECHOCARDIOGRAM;  Surgeon: Gaye Pollack, MD;  Location: [place] OR;  Service: Open Heart Surgery;  Laterality: N/A;  . LEFT AND RIGHT HEART CATHETERIZATION WITH CORONARY ANGIOGRAM N/A 04/21/2014   Procedure: LEFT AND RIGHT HEART CATHETERIZATION WITH CORONARY ANGIOGRAM;  Surgeon: Burnell Blanks, MD;  Location: [facility] CATH LAB;  Service: Cardiovascular;  Laterality: N/A;  . SHOULDER SURGERY    . TEE  WITHOUT CARDIOVERSION N/A 05/19/2017   Procedure: TRANSESOPHAGEAL ECHOCARDIOGRAM (TEE);  Surgeon: Larey Dresser, MD;  Location: [facility] ENDOSCOPY;  Service: Cardiovascular;  Laterality: N/A;     Home Medications:  Prior to Admission medications   Medication Sig Start Date End Date Taking? Authorizing Provider  acetaminophen (TYLENOL) 500 MG tablet Take 1,000 mg by mouth every 6 (six) hours as needed for moderate pain.    Yes [provider]  amiodarone (PACERONE) 200 MG tablet Take 1 tablet (200 mg total) by mouth 2 (two) times daily. 200MG  BID X2 WEEKS THEN DAILY Patient taking differently: Take 200 mg by mouth daily.  06/22/17  Yes Croitoru, Mihai, MD  apixaban (ELIQUIS) 2.5 MG TABS tablet Take 1 tablet (2.5 mg total) by mouth 2 (two) times daily. Patient taking differently: Take 2.5 mg by mouth daily.  06/25/17  Yes Croitoru, Mihai, MD  atorvastatin (LIPITOR) 80 MG tablet Take 40 mg by mouth daily.   Yes [provider]  B Complex-C (B-COMPLEX WITH VITAMIN C) tablet Take 1 tablet by mouth daily.   Yes [provider]  carvedilol (COREG) 3.125 MG tablet Take 1 tablet (3.125 mg total) by mouth 2 (two) times daily with a meal. 06/25/17  Yes Croitoru, Mihai, MD  Cholecalciferol (VITAMIN D3) 5000 units TABS Take 1 tablet by mouth every other day.   Yes [provider]  docusate sodium (COLACE) 100 MG capsule Take 100 mg by mouth daily.   Yes [provider]  folic acid (FOLVITE) 1 MG tablet Take 400 mcg by mouth daily.  04/28/14  Yes Lars Pinks M, PA-C  furosemide (LASIX) 40 MG tablet Take 1 tablet (40 mg total) by mouth daily. 06/25/17  Yes Croitoru, Mihai, MD  loratadine (CLARITIN) 10 MG tablet Take 1 tablet by mouth daily.   Yes [provider]  Magnesium 400 MG CAPS Take 400 mg by mouth every other day. 06/25/17  Yes Croitoru, Mihai, MD  Melatonin 5 MG TABS Take 5-10 mg by mouth as needed (sleep).   Yes [provider]  Multiple  Vitamins-Minerals (MULTIVITAMIN WITH MINERALS) tablet Take 1 tablet by mouth daily.   Yes [provider]  pantoprazole (PROTONIX) 40 MG tablet TAKE 1 TABLET (40 MG TOTAL) BY MOUTH DAILY BEFORE BREAKFAST. 05/11/17  Yes Gatha Mayer, MD  polyethylene glycol [facility] / Floria Raveling) packet Take 17 g by mouth daily as needed for mild constipation. 05/21/17  Yes Bonnielee Haff, MD  potassium chloride SA (K-DUR,KLOR-CON) 20 MEQ tablet Take 1 tablet (20 mEq total) by mouth daily. TAKE WHEN TAKING YOUR LASIX 06/22/17  Yes Croitoru, Mihai, MD  Probiotic Product (PROBIOTIC PO) Take 1 tablet by mouth daily.   Yes [provider]  tamsulosin (FLOMAX) 0.4 MG CAPS capsule Take 1 capsule (0.4 mg total) by mouth daily after supper. 05/21/17  Yes Bonnielee Haff, MD  traZODone (DESYREL) 50 MG tablet Take 50-100 mg by  mouth as needed for sleep. 07/03/17  Yes [provider]    Inpatient Medications: Scheduled Meds: . amiodarone  200 mg Oral Daily  . apixaban  2.5 mg Oral BID  . atorvastatin  40 mg Oral Daily  . B-complex with vitamin C  1 tablet Oral Daily  . carvedilol  3.125 mg Oral BID WC  . [START ON 07/15/2017] cholecalciferol  5,000 Units Oral QODAY  . docusate sodium  100 mg Oral Daily  . folic acid  1 mg Oral Daily  . furosemide  40 mg Oral Daily  . loratadine  10 mg Oral Daily  . magnesium oxide  400 mg Oral QODAY  . multivitamin with minerals  1 tablet Oral Daily  . pantoprazole  40 mg Oral Daily  . potassium chloride SA  20 mEq Oral Daily  . sodium chloride flush  3 mL Intravenous Q12H  . tamsulosin  0.4 mg Oral QPC supper   Continuous Infusions: . cefTRIAXone (ROCEPHIN)  IV Stopped (07/13/17 2100)   PRN Meds: acetaminophen, polyethylene glycol, traZODone  Allergies:    Allergies  Allergen Reactions  . Sulfa Antibiotics Rash    Social History:   Social History   Social History  . Marital status: Widowed    Spouse name: N/A  . Number of children: 2  .  Years of education: N/A   Occupational History  . retired    Social History Main Topics  . Smoking status: Never Smoker  . Smokeless tobacco: Never Used  . Alcohol use 0.6 oz/week    1 Standard drinks or equivalent per week     Comment: rare  . Drug use: No  . Sexual activity: Not on file   Other Topics Concern  . Not on file   Social History Narrative   Retired Conservation officer, historic buildings. Widowed.          Family History:    Family History  Problem Relation Age of Onset  . Heart attack Mother   . Kidney disease Father   . Colon cancer Brother   . Dementia Brother   . Aneurysm Brother   . Breast cancer Sister        x 2  . Arthritis Sister   . Osteoporosis Sister      ROS:  Please see the history of present illness.  ROS  All other ROS reviewed and negative.     Physical Exam/Data:   Vitals:   07/14/17 0405 07/14/17 0500 07/14/17 0600 07/14/17 1110  BP: (!) 151/57 (!) 168/66 (!) 151/73 (!) 120/54  Pulse: (!) 52 (!) 58 (!) 56 (!) 55  Resp: 14 16 15 18   Temp:   98.1 F (36.7 C) 98.3 F (36.8 C)  TempSrc:   Oral Oral  SpO2: 98% 100% 98% 98%  Weight:   146 lb 1.6 oz (66.3 kg)   Height:   5\' 10"  (1.778 m)     Intake/Output Summary (Last 24 hours) at 07/14/17 1247 Last data filed at 07/14/17 0730  Gross per 24 hour  Intake              290 ml  Output                0 ml  Net              290 ml   Filed Weights   07/13/17 1541 07/14/17 0600  Weight: 143 lb (64.9 kg) 146 lb 1.6 oz (66.3 kg)   Body mass index  is 20.96 kg/m.  General:  Well nourished, well developed, elderly male,  in no acute distress HEENT: normal Lymph: no adenopathy Neck: no JVD Endocrine:  No thryomegaly Vascular: No carotid bruits; FA pulses 2+ bilaterally without bruits  Cardiac:  normal S1, S2; RRR; no murmur  Lungs:  clear to auscultation bilaterally, no wheezing, rhonchi or rales  Abd: soft, nontender, no hepatomegaly  Ext: no edema Musculoskeletal:  No deformities, BUE and BLE  strength normal and equal Skin: warm and dry  Neuro:  CNs 2-12 intact, no focal abnormalities noted Psych:  Normal affect   EKG:  The EKG was personally reviewed and demonstrates: sinus rhythm at 56 bpm,  IVCD, consider atypical LBBB Telemetry:  Telemetry was personally reviewed and demonstrates:  Sinus bradycardia in the 50's  Relevant CV Studies:  Transthoracic echocardiogram 05/16/17 Study Conclusions - Left ventricle: EF hard to judge due to rapid afib. Inferior and septal akinesis The cavity size was moderately dilated. Wall thickness was normal. Systolic function was severely reduced. The estimated ejection fraction was in the range of 25% to 30%. The study is not technically sufficient to allow evaluation of LV diastolic function. - Aortic valve: There was mild regurgitation. - Mitral valve: There was moderate regurgitation. - Left atrium: The atrium was moderately dilated. - Right atrium: The atrium was mildly dilated. - Atrial septum: No defect or patent foramen ovale was identified. - Tricuspid valve: There was moderate regurgitation. - Pulmonary arteries: PA peak pressure: 31 mm Hg (S).  TEE 05/19/17 Impression:  1. EF 25-30% with mildly dilated LV.  2. Mildly dilated RV with moderately decreased systolic function.  3. Severe, probably functional MR.  4. No LA appendage thrombus, proceed with DCCV.   Laboratory Data:  Chemistry Recent Labs Lab 07/13/17 1605 07/14/17 0803  NA 139 140  K 4.0 3.9  CL 101 103  CO2 31 30  GLUCOSE 112* 112*  BUN 31* 28*  CREATININE 1.57* 1.45*  CALCIUM 8.7* 8.6*  GFRNONAA 36* 39*  GFRAA 41* 46*  ANIONGAP 7 7     Recent Labs Lab 07/13/17 2009  PROT 6.0*  ALBUMIN 3.4*  AST 25  ALT 18  ALKPHOS 64  BILITOT 0.8   Hematology Recent Labs Lab 07/13/17 1605 07/14/17 0803  WBC 9.8 9.6  RBC 5.07 4.75  HGB 14.3 13.2  HCT 43.7 41.1  MCV 86.2 86.5  MCH 28.2 27.8  MCHC 32.7 32.1  RDW 16.6* 16.5*  PLT 192  188   Cardiac Enzymes Recent Labs Lab 07/13/17 1659  TROPONINI <0.03   No results for input(s): TROPIPOC in the last 168 hours.  BNPNo results for input(s): BNP, PROBNP in the last 168 hours.  DDimer No results for input(s): DDIMER in the last 168 hours.  Radiology/Studies:  Dg Chest 2 View  Result Date: 07/13/2017 CLINICAL DATA:  Syncope with brief loss of consciousness for 30 seconds. Cyanosis. EXAM: CHEST  2 VIEW COMPARISON:  05/20/2017 FINDINGS: Status post CABG with stable cardiomegaly and aortic atherosclerosis. Upper lobe predominant emphysematous hyperinflation of the lungs with crowding of lower lobe interstitial lung markings. Slightly more confluent airspace opacity abutting the minor fissure involving the right upper lobe. A small focus of pneumonia is not excluded versus atelectasis (favored). Osteoarthritis of both glenohumeral and AC joints. Thoracic spondylosis. IMPRESSION: 1. Cardiomegaly with upper lobe predominant emphysematous hyperinflation of the lungs. 2. Slightly more confluent airspace opacity involving the right upper lobe adjacent to the minor fissure cannot exclude a small focus of  pneumonia versus atelectasis. Electronically Signed   By: Ashley Royalty M.D.   On: 07/13/2017 18:14    Assessment and Plan:   Syncope -Pt seen in the office on 06/22/17 with orthostatic hypotension. Lasix was decreased, amiodarone decreased. Was recommended to resume Midodrine, but does not look like Midodrine was restarted, Per phone call with daughter. -He presented with near syncope upon rising quickly. -Blood pressure has been normal with occasional elevated readings while here.  -Cardiac rhythm is sinus bradycardia in the 50's with no pauses or tachyarrhythmias.  -Pt has been known to have orthostatic hypotension with dizziness for years and has been on Midodrine in the past. He was off BB and ACE-I/ARB due to this issue. Midodrine was stopped in 04/2017 in setting of rapid afib.    -Syncope is likely vasovagal in nature.  -Advise pt on orthostatic precautions, rising slowly, wear support stockings.  -Restart midodrine 5 mg TID with meals.  Chronic systolic CHF -EF down in 02/7424 to 25-30% in setting of rapid atrial fibrillation. (down from 45-50% in 2015) -Wt is stable at 146 lbs today.  Was 143 at office visit on 05/21/17 and 156 on 09/17/16 -At office visit on 7/30 Furosemide was decreased from 40 mg bid to once daily with KDur 68mEq QOD. Pt is also on carvedilol 3.125 mg bid. Not on ACE-I/ARB due to hypotension.  -Pt is euvolemic today.  -Continue lasix 40 mg daily. Stop carvedilol.   Paroxysmal atrial fibrillation -Pt had an episode of afib with RVR in setting of PNA and other illnesses in 04/2017. He was started on amiodarone and DCCV done.  -CHA2DS2-VASc Score and unadjusted Ischemic Stroke Rate (% per year) is equal to 4.8 % stroke rate/year from a score of 4. He is anticoagulated with Eliquis 2.5 mg bid (lower dose for >80 and CR >1.5) -Maintaining sinus bradycardia in the 50's, no pauses or tachyarrhythmias on tele.  -continue Amiodarone 200 mg daily and stop carvedilol is setting of bradycardia and orthostatic hypotension  Renal insufficiency -SCr 1.57 -> 1.45 today,  K+ within normal range.  -continue to monitor  Hyperlipidemia -Last lipid panel 10/09/16- LDL 61.  -Continue Atorvastatin 80 mg daily  Signed, Daune Perch, NP  07/14/2017 12:47 PM  Pager: [phone]  Personally seen and examined. Agree with above. 83 year old male with long-standing history orthostatic hypotension currently off of midodrine, with paroxysmal atrial fibrillation, chronic systolic heart failure ejection fraction of 25-30% admitted with near syncope.  Near syncope/orthostatic hypotension  - Labile blood pressures have been an ongoing issue for him  - In review of previous clinic notes, he had been on Midodrin in the past, so we will restart 5 mg 3 times a day to be  taken with meals. Have discussed with pharmacy team.  - We will try to avoid Florinef given volume expansion and potential for worsening heart failure-like symptoms.  - Currently he is lying comfortably flat in bed without any orthopnea, no PND, lungs are clear, alert, loquacious  - He is quite robust for a 83 year old. Gae Bon spent quite some time on the phone with his daughter who takes good care of him.  - Encourage compression hose as well. Encourage taking his time when getting up from a seated position. He does admit that he likes to "get up and go ". He told me a few stories about his days in [place].  Atrial fibrillation paroxysmal  - Currently sinus bradycardia in the 50s. Continue with current recently reduced amiodarone dose.  No need for pacemaker.  Chronic systolic heart failure/dilated cardiomyopathy  - If his blood pressures remain low, we may need to discontinue his low-dose carvedilol 3.125 mg twice a day.  - Continue with Lasix 40 mg once a day and as instructed, take additional Lasix 40 mg if lower extremity edema develops. Currently he does not show any lower extremity edema.  We'll follow along  Candee Furbish, MD

## 2017-07-14 NOTE — ED Notes (Signed)
Called pt's daughter and left a message about pt's bed assignment.

## 2017-07-14 NOTE — Progress Notes (Signed)
PROGRESS NOTE    Bruce Green  IEP:[id number]  DOB: 06-13-1919  DOA: 07/13/2017 PCP: Kathyrn Lass, MD   Brief Admission Hx: HPI: Bruce Green is a 83 y.o. male with medical history significant of ICM, CABG in 2015, CHF with EF 25-30%, Severe MR (most recent echo was TEE in June).  Patient presents to the ED after syncopal episode at home today.  Was walking with walker, had syncope, caught by aid so didn't have fall.  Aid states patient became cyanotic.  This also happened on either Friday or Sat over the weekend.  Patient on amiodarone and cardizem both of which were adjusted recently for A.Fib.  Also had cardioversion recently.  No fever, has productive cough that is chronic and unchanged, no SOB, no CP, no dysuria.  MDM/Assessment & Plan:   Syncope - suspect this is vasovagal syncope as it occurs when standing up from seated position and also noticed that he is bradycardic with a HR of 54.  I wonder if he needs to be taken off coreg.  I have asked the cardiology service to consult on this patient for assistance with medication management.  In addition he has severe MR and ICM which also may be contributing to his symptomatology.     Cough - Repeat a CXR in AM.   UTI - urine culture pending, treating with ceftriaxone IV.    Severe MR - Had recent TEE done in last 2 months.   ICM - EF 20-25%, cardiology consult pending.    DVT prophylaxis: eliquis Code Status: full  Family Communication: daughter bedside Disposition Plan: TBD   Consultants:  Cardiology  Subjective: Pt having a raspy cough.    Objective: Vitals:   07/14/17 0300 07/14/17 0405 07/14/17 0500 07/14/17 0600  BP: (!) 143/55 (!) 151/57 (!) 168/66 (!) 151/73  Pulse: (!) 53 (!) 52 (!) 58 (!) 56  Resp: 16 14 16 15   Temp:    98.1 F (36.7 C)  TempSrc:    Oral  SpO2: 98% 98% 100% 98%  Weight:    66.3 kg (146 lb 1.6 oz)  Height:    5\' 10"  (1.778 m)    Intake/Output Summary (Last 24 hours) at 07/14/17  0841 Last data filed at 07/13/17 2100  Gross per 24 hour  Intake               50 ml  Output                0 ml  Net               50 ml   Filed Weights   07/13/17 1541 07/14/17 0600  Weight: 64.9 kg (143 lb) 66.3 kg (146 lb 1.6 oz)     REVIEW OF SYSTEMS  As per history otherwise all reviewed and reported negative  Exam:  General exam: appears younger than stated age, NAD, cooperative, raspy cough heard.  Respiratory system: Clear. No increased work of breathing. Cardiovascular system: S1 & S2 heard, irregular, bradycardic.  Gastrointestinal system: Abdomen is nondistended, soft and nontender. Normal bowel sounds heard. Central nervous system: Alert and oriented. No focal neurological deficits. Extremities: no CCE.  Data Reviewed: Basic Metabolic Panel:  Recent Labs Lab 07/13/17 1605  NA 139  K 4.0  CL 101  CO2 31  GLUCOSE 112*  BUN 31*  CREATININE 1.57*  CALCIUM 8.7*   Liver Function Tests:  Recent Labs Lab 07/13/17 2009  AST 25  ALT 18  ALKPHOS 64  BILITOT 0.8  PROT 6.0*  ALBUMIN 3.4*   No results for input(s): LIPASE, AMYLASE in the last 168 hours. No results for input(s): AMMONIA in the last 168 hours. CBC:  Recent Labs Lab 07/13/17 1605  WBC 9.8  HGB 14.3  HCT 43.7  MCV 86.2  PLT 192   Cardiac Enzymes:  Recent Labs Lab 07/13/17 1659  TROPONINI <0.03   CBG (last 3)   Recent Labs  07/13/17 1609  GLUCAP 111*   No results found for this or any previous visit (from the past 240 hour(s)).   Studies: Dg Chest 2 View  Result Date: 07/13/2017 CLINICAL DATA:  Syncope with brief loss of consciousness for 30 seconds. Cyanosis. EXAM: CHEST  2 VIEW COMPARISON:  05/20/2017 FINDINGS: Status post CABG with stable cardiomegaly and aortic atherosclerosis. Upper lobe predominant emphysematous hyperinflation of the lungs with crowding of lower lobe interstitial lung markings. Slightly more confluent airspace opacity abutting the minor fissure  involving the right upper lobe. A small focus of pneumonia is not excluded versus atelectasis (favored). Osteoarthritis of both glenohumeral and AC joints. Thoracic spondylosis. IMPRESSION: 1. Cardiomegaly with upper lobe predominant emphysematous hyperinflation of the lungs. 2. Slightly more confluent airspace opacity involving the right upper lobe adjacent to the minor fissure cannot exclude a small focus of pneumonia versus atelectasis. Electronically Signed   By: Ashley Royalty M.D.   On: 07/13/2017 18:14   Scheduled Meds: . amiodarone  200 mg Oral Daily  . apixaban  2.5 mg Oral Daily  . atorvastatin  40 mg Oral Daily  . B-complex with vitamin C  1 tablet Oral Daily  . carvedilol  3.125 mg Oral BID WC  . [START ON 07/15/2017] cholecalciferol  5,000 Units Oral QODAY  . docusate sodium  100 mg Oral Daily  . folic acid  1 mg Oral Daily  . furosemide  40 mg Oral Daily  . loratadine  10 mg Oral Daily  . magnesium oxide  400 mg Oral QODAY  . multivitamin with minerals  1 tablet Oral Daily  . pantoprazole  40 mg Oral Daily  . potassium chloride SA  20 mEq Oral Daily  . sodium chloride flush  3 mL Intravenous Q12H  . tamsulosin  0.4 mg Oral QPC supper   Continuous Infusions: . cefTRIAXone (ROCEPHIN)  IV Stopped (07/13/17 2100)   Principal Problem:   Syncope Active Problems:   Cardiomyopathy, ischemic   Mitral regurgitation   Acute lower UTI   Syncope and collapse  Time spent:   Irwin Brakeman, MD, FAAFP [facility] Pager [phone]  If 7PM-7AM, please contact night-coverage [email or web address] Password [email or web address] 07/14/2017, 8:41 AM    LOS: 0 days

## 2017-07-15 DIAGNOSIS — I951 Orthostatic hypotension: Secondary | ICD-10-CM

## 2017-07-15 DIAGNOSIS — R55 Syncope and collapse: Secondary | ICD-10-CM | POA: Diagnosis not present

## 2017-07-15 DIAGNOSIS — I255 Ischemic cardiomyopathy: Secondary | ICD-10-CM | POA: Diagnosis not present

## 2017-07-15 DIAGNOSIS — I340 Nonrheumatic mitral (valve) insufficiency: Secondary | ICD-10-CM | POA: Diagnosis not present

## 2017-07-15 DIAGNOSIS — N390 Urinary tract infection, site not specified: Secondary | ICD-10-CM | POA: Diagnosis not present

## 2017-07-15 LAB — COMPREHENSIVE METABOLIC PANEL
ALT: 17 U/L (ref 17–63)
ANION GAP: 8 (ref 5–15)
AST: 23 U/L (ref 15–41)
Albumin: 3 g/dL — ABNORMAL LOW (ref 3.5–5.0)
Alkaline Phosphatase: 66 U/L (ref 38–126)
BILIRUBIN TOTAL: 0.8 mg/dL (ref 0.3–1.2)
BUN: 22 mg/dL — ABNORMAL HIGH (ref 6–20)
CO2: 28 mmol/L (ref 22–32)
Calcium: 8.8 mg/dL — ABNORMAL LOW (ref 8.9–10.3)
Chloride: 102 mmol/L (ref 101–111)
Creatinine, Ser: 1.46 mg/dL — ABNORMAL HIGH (ref 0.61–1.24)
GFR, EST AFRICAN AMERICAN: 45 mL/min — AB (ref >60)
GFR, EST NON AFRICAN AMERICAN: 39 mL/min — AB (ref >60)
Glucose, Bld: 107 mg/dL — ABNORMAL HIGH (ref 65–99)
POTASSIUM: 4.4 mmol/L (ref 3.5–5.1)
Sodium: 138 mmol/L (ref 135–145)
TOTAL PROTEIN: 5.2 g/dL — AB (ref 6.5–8.1)

## 2017-07-15 LAB — CBC
HEMATOCRIT: 42.7 % (ref 39.0–52.0)
Hemoglobin: 13.9 g/dL (ref 13.0–17.0)
MCH: 28.3 pg (ref 26.0–34.0)
MCHC: 32.6 g/dL (ref 30.0–36.0)
MCV: 86.8 fL (ref 78.0–100.0)
Platelets: 198 10*3/uL (ref 150–400)
RBC: 4.92 MIL/uL (ref 4.22–5.81)
RDW: 16.5 % — AB (ref 11.5–15.5)
WBC: 10.7 10*3/uL — ABNORMAL HIGH (ref 4.0–10.5)

## 2017-07-15 LAB — GLUCOSE, CAPILLARY: GLUCOSE-CAPILLARY: 121 mg/dL — AB (ref 65–99)

## 2017-07-15 MED ORDER — CEFPODOXIME PROXETIL 200 MG PO TABS: 200.0000 mg | ORAL_TABLET | Freq: Two times a day (BID) | ORAL | 0 refills | Status: DC

## 2017-07-15 MED ORDER — MIDODRINE HCL 5 MG PO TABS: 5.0000 mg | ORAL_TABLET | Freq: Three times a day (TID) | ORAL | 0 refills | Status: DC

## 2017-07-15 MED ORDER — AMIODARONE HCL 200 MG PO TABS
200.0000 mg | ORAL_TABLET | Freq: Every day | ORAL | 0 refills | Status: DC
Start: 2017-07-15 — End: 2017-09-21

## 2017-07-15 MED ORDER — CEFPODOXIME PROXETIL 200 MG PO TABS
200.0000 mg | ORAL_TABLET | ORAL | Status: DC
  Administered 2017-07-15: 200 mg via ORAL
  Filled 2017-07-15: qty 1

## 2017-07-15 NOTE — Progress Notes (Signed)
Progress Note  Patient Name: Bruce Green Date of Encounter: 07/15/2017  Primary Cardiologist: Dr. Sallyanne Kuster  Subjective   Feels well, no dizziness.   Inpatient Medications    Scheduled Meds: . amiodarone  200 mg Oral Daily  . apixaban  2.5 mg Oral BID  . atorvastatin  40 mg Oral Daily  . B-complex with vitamin C  1 tablet Oral Daily  . cholecalciferol  5,000 Units Oral QODAY  . docusate sodium  100 mg Oral Daily  . folic acid  1 mg Oral Daily  . furosemide  40 mg Oral Daily  . loratadine  10 mg Oral Daily  . magnesium oxide  400 mg Oral QODAY  . midodrine  5 mg Oral TID WC  . multivitamin with minerals  1 tablet Oral Daily  . pantoprazole  40 mg Oral Daily  . potassium chloride SA  20 mEq Oral Daily  . sodium chloride flush  3 mL Intravenous Q12H  . tamsulosin  0.4 mg Oral QPC supper   Continuous Infusions: . cefTRIAXone (ROCEPHIN)  IV Stopped (07/14/17 2141)   PRN Meds: acetaminophen, polyethylene glycol, traZODone   Vital Signs    Vitals:   07/14/17 1624 07/14/17 2050 07/15/17 0440 07/15/17 0500  BP: 133/62 (!) 170/66    Pulse: (!) 51 61    Resp: 15 (!) 26    Temp: 98.4 F (36.9 C) 98.2 F (36.8 C) 98.1 F (36.7 C)   TempSrc: Oral Oral Oral   SpO2: 98% 100%    Weight:    149 lb (67.6 kg)  Height:        Intake/Output Summary (Last 24 hours) at 07/15/17 0949 Last data filed at 07/14/17 1630  Gross per 24 hour  Intake              840 ml  Output                0 ml  Net              840 ml   Filed Weights   07/13/17 1541 07/14/17 0600 07/15/17 0500  Weight: 143 lb (64.9 kg) 146 lb 1.6 oz (66.3 kg) 149 lb (67.6 kg)    Telemetry    SBrady, 1st degree AVB - Personally Reviewed  ECG    Sinus brady - Personally Reviewed  Physical Exam   GEN: No acute distress.  Looks younger than age Neck: No JVD Cardiac: RRR, no murmurs, rubs, or gallops.  Respiratory: Clear to auscultation bilaterally. GI: Soft, nontender, non-distended  MS: No  edema; No deformity. Neuro:  Nonfocal  Psych: Normal affect   Labs    Chemistry Recent Labs Lab 07/13/17 1605 07/13/17 2009 07/14/17 0803 07/15/17 0147  NA 139  --  140 138  K 4.0  --  3.9 4.4  CL 101  --  103 102  CO2 31  --  30 28  GLUCOSE 112*  --  112* 107*  BUN 31*  --  28* 22*  CREATININE 1.57*  --  1.45* 1.46*  CALCIUM 8.7*  --  8.6* 8.8*  PROT  --  6.0*  --  5.2*  ALBUMIN  --  3.4*  --  3.0*  AST  --  25  --  23  ALT  --  18  --  17  ALKPHOS  --  64  --  66  BILITOT  --  0.8  --  0.8  GFRNONAA 36*  --  39* [place] 7  --  7 8     Hematology Recent Labs Lab 07/13/17 1605 07/14/17 0803 07/15/17 0147  WBC 9.8 9.6 10.7*  RBC 5.07 4.75 4.92  HGB 14.3 13.2 13.9  HCT 43.7 41.1 42.7  MCV 86.2 86.5 86.8  MCH 28.2 27.8 28.3  MCHC 32.7 32.1 32.6  RDW 16.6* 16.5* 16.5*  PLT 192 188 198    Cardiac Enzymes Recent Labs Lab 07/13/17 1659  TROPONINI <0.03   No results for input(s): TROPIPOC in the last 168 hours.   BNPNo results for input(s): BNP, PROBNP in the last 168 hours.   DDimer No results for input(s): DDIMER in the last 168 hours.   Radiology    Dg Chest 2 View  Result Date: 07/13/2017 CLINICAL DATA:  Syncope with brief loss of consciousness for 30 seconds. Cyanosis. EXAM: CHEST  2 VIEW COMPARISON:  05/20/2017 FINDINGS: Status post CABG with stable cardiomegaly and aortic atherosclerosis. Upper lobe predominant emphysematous hyperinflation of the lungs with crowding of lower lobe interstitial lung markings. Slightly more confluent airspace opacity abutting the minor fissure involving the right upper lobe. A small focus of pneumonia is not excluded versus atelectasis (favored). Osteoarthritis of both glenohumeral and AC joints. Thoracic spondylosis. IMPRESSION: 1. Cardiomegaly with upper lobe predominant emphysematous hyperinflation of the lungs. 2. Slightly more confluent airspace opacity involving the right upper lobe  adjacent to the minor fissure cannot exclude a small focus of pneumonia versus atelectasis. Electronically Signed   By: Ashley Royalty M.D.   On: 07/13/2017 18:14    Cardiac Studies   EF 30%  Patient Profile     83 y.o. male with long standing history of orthostatic hypotension here with near syncope.  Assessment & Plan    Orthostatic hypotension  - restarted midodrine 5mg  PO TID  - willing to tolerate HTN  - long standing issue  - reviewed clinic notes  PAF  - sinus brady now  - Eliquis BID (may have been taking once a day at home)  - no pacer needs  Chronic systolic HF  - OK with lasix 40 QD with additional dose if edema occurs at home.   - EF 30%  - AMIO.   - Coreg 3.125 BID  OK with DC. Discussed with Dr. Bonner Puna, daughter and patient.  Follow up with DR. Croitoru or APP in 4 weeks. Has appt sched.    Signed, Candee Furbish, MD  07/15/2017, 9:49 AM

## 2017-07-15 NOTE — Care Management Note (Signed)
Case Management Note Marvetta Gibbons RN, BSN Unit 4E-Case Manager [phone]  Patient Details  Name: Bruce Green MRN: [id number] Date of Birth: 05-10-1919  Subjective/Objective:  Pt presented with syncope                  Action/Plan: PTA pt lived at home- has 24/7 care and [facility] services with [facility] for HHRN/PT- have notified Dian Situ with [facility] of pt's discharge for today- [facility] services to be resumed- MD notified  Expected Discharge Date:  07/15/17               Expected Discharge Plan:  [place]  In-House Referral:     Discharge planning Services  HF Clinic, CM Consult  Post Acute Care Choice:  Home Health, Resumption of Svcs/PTA Provider Choice offered to:  Adult Children, Patient  DME Arranged:    DME Agency:     HH Arranged:  RN, PT [place] Agency:  [place]  Status of Service:  Completed, signed off  If discussed at [place] of Stay Meetings, dates discussed:    Discharge Disposition: home/home health   Additional Comments:  Dawayne Patricia, RN 07/15/2017, 11:51 AM

## 2017-07-15 NOTE — Progress Notes (Signed)
Discharge instructions given to patient and daughter.  Questions answered.  IV site and Telemetry discontinued.  Mervyn Skeeters, RN

## 2017-07-15 NOTE — Discharge Summary (Addendum)
Physician Discharge Summary  Bruce Green VFI:[id number] DOB: [birth year] DOA: 07/13/2017  PCP: Kathyrn Lass, MD  Admit date: 07/13/2017 Discharge date: 07/15/2017  Admitted From: Home Disposition: Home   Recommendations for Outpatient Follow-up:  1. Follow up with PCP in 1-2 weeks 2. Follow up with cardiology as scheduled.  3. Stopped coreg, started midodrine 5mg  TIDWC.  4. Will complete 7 days of antibiotics for UTI and infiltrate on CXR.   Home Health: Resume PT and RN Equipment/Devices: None Discharge Condition: Stable CODE STATUS: Full Diet recommendation: As tolerated  Brief/Interim Summary: HPI: Bruce Green a 83 y.o.malewith medical history significant of ICM, CABG in 2015, CHF with EF 25-30%, Severe MR (most recent echo was TEE in June). Patient presents to the ED after syncopal episode at home today. Was walking with walker, had syncope, caught by aid so didn't have fall. Aid states patient became cyanotic. This also happened on either Friday or Sat over the weekend. Patient on amiodarone and cardizem both of which were adjusted recently for A.Fib. Also had cardioversion recently.  No fever, has productive cough that is chronic and unchanged, no SOB, no CP, no dysuria.  Cardiology was consulted, restarted midodrine and pt's symptoms have improved without further syncope.   Discharge Diagnoses:  Principal Problem:   Syncope Active Problems:   Cardiomyopathy, ischemic   Mitral regurgitation   Acute lower UTI   Syncope and collapse   Urinary tract infection  Syncope: Consistent with vasovagal and with longstanding history of orthostatic hypotension.  - Cardiology consulted, continuing medications as below and added midodrine.  - Follow up with cardiology, discontinuing beta blocker.   Possible pneumonia: Possible developing infiltrate on CXR, would treat was community acquired. Afebrile, very mild leukocytosis at 10.7.  - Will Rx vantin to treat this  and UTI, prolong duration to 7 days total with CTX.   UTI:  - Urine culture pending.  - Treated with ceftriaxone, will transition to   Severe MR: - Had recent TEE done in last 2 months.   Paroxysmal atrial fibrillation: Pt had an episode of afib with RVR in setting of PNA and other illnesses in 04/2017. He was started on amiodarone and DCCV done.  -CHA2DS2-VASc score is 4. He is anticoagulated with Eliquis 2.5 mg BID (lower dose for >80 and CR >1.5) - Maintaining sinus bradycardia in the 50's, no pauses or tachyarrhythmias on tele.  - Per cardiology recommendations: continue amiodarone 200 mg daily and stop carvedilol.  Chronic systolic CHF and ICM s/p CABG: Appears compensated. EF down in 04/2017 to 25-30% in setting of rapid atrial fibrillation. (down from 45-50% in 2015) - Continue medications as below and follow up with cardiology.  - Wt is stable at 149lbs. Was 156 on 09/17/16 - At office visit on 7/30 Furosemide was decreased from 40 mg bid to once daily with KDur 45mEq QOD. Pt was also on carvedilol 3.125 mg bid, DC'ed per cardiology. Not on ACE-I/ARB due to hypotension.   Stage III CKD: At baseline.  - Monitor at follow up  Hyperlipidemia: Last lipid panel 10/09/16- LDL 61.  - Continue Atorvastatin 80 mg daily  Discharge Instructions Discharge Instructions    Discharge instructions    Complete by:  As directed    Start taking midodrine three times daily with meals Stop taking coreg Start taking vantin twice daily to treat pneumonia and UTI for the next 5 days.  Take a lot of time getting up to let your system equilibrate. Continue wearing compression  hose.  Follow up with cardiology and your PCP, return for care immediately if you experience another near passing out episode.     Allergies as of 07/15/2017      Reactions   Sulfa Antibiotics Rash      Medication List    STOP taking these medications   carvedilol 3.125 MG tablet Commonly known as:  COREG     TAKE  these medications   acetaminophen 500 MG tablet Commonly known as:  TYLENOL Take 1,000 mg by mouth every 6 (six) hours as needed for moderate pain.   amiodarone 200 MG tablet Commonly known as:  PACERONE Take 1 tablet (200 mg total) by mouth daily.   apixaban 2.5 MG Tabs tablet Commonly known as:  ELIQUIS Take 1 tablet (2.5 mg total) by mouth 2 (two) times daily. What changed:  when to take this   atorvastatin 80 MG tablet Commonly known as:  LIPITOR Take 40 mg by mouth daily.   B-complex with vitamin C tablet Take 1 tablet by mouth daily.   cefpodoxime 200 MG tablet Commonly known as:  VANTIN Take 1 tablet (200 mg total) by mouth every 12 (twelve) hours.   docusate sodium 100 MG capsule Commonly known as:  COLACE Take 100 mg by mouth daily.   folic acid 1 MG tablet Commonly known as:  FOLVITE Take 400 mcg by mouth daily.   furosemide 40 MG tablet Commonly known as:  LASIX Take 1 tablet (40 mg total) by mouth daily.   loratadine 10 MG tablet Commonly known as:  CLARITIN Take 1 tablet by mouth daily.   Magnesium 400 MG Caps Take 400 mg by mouth every other day.   Melatonin 5 MG Tabs Take 5-10 mg by mouth as needed (sleep).   midodrine 5 MG tablet Commonly known as:  PROAMATINE Take 1 tablet (5 mg total) by mouth 3 (three) times daily with meals.   multivitamin with minerals tablet Take 1 tablet by mouth daily.   pantoprazole 40 MG tablet Commonly known as:  PROTONIX TAKE 1 TABLET (40 MG TOTAL) BY MOUTH DAILY BEFORE BREAKFAST.   polyethylene glycol packet Commonly known as:  MIRALAX / GLYCOLAX Take 17 g by mouth daily as needed for mild constipation.   potassium chloride SA 20 MEQ tablet Commonly known as:  K-DUR,KLOR-CON Take 1 tablet (20 mEq total) by mouth daily. TAKE WHEN TAKING YOUR LASIX   PROBIOTIC PO Take 1 tablet by mouth daily.   tamsulosin 0.4 MG Caps capsule Commonly known as:  FLOMAX Take 1 capsule (0.4 mg total) by mouth daily after  supper.   traZODone 50 MG tablet Commonly known as:  DESYREL Take 50-100 mg by mouth as needed for sleep.   Vitamin D3 5000 units Tabs Take 1 tablet by mouth every other day.            Discharge Care Instructions        Start     Ordered   07/15/17 0000  amiodarone (PACERONE) 200 MG tablet  Daily     07/15/17 1110   07/15/17 0000  cefpodoxime (VANTIN) 200 MG tablet  Every 12 hours     07/15/17 1110   07/15/17 0000  midodrine (PROAMATINE) 5 MG tablet  3 times daily with meals     07/15/17 1110   07/15/17 0000  Discharge instructions    Comments:  Start taking midodrine three times daily with meals Stop taking coreg Start taking vantin twice daily to treat pneumonia and  UTI for the next 5 days.  Take a lot of time getting up to let your system equilibrate. Continue wearing compression hose.  Follow up with cardiology and your PCP, return for care immediately if you experience another near passing out episode.   07/15/17 1110     Follow-up Information    Barrett, Evelene Croon, PA-C Follow up.   Specialties:  Cardiology, Radiology Why:  On September 11 at 9:30 for cardiology hospital follow up.  Contact information: [address] [place] [address] [phone]        Kathyrn Lass, MD. Schedule an appointment as soon as possible for a visit in 1 week(s).   Specialty:  Family Medicine Contact information: [place] [address] [phone]          Allergies  Allergen Reactions  . Sulfa Antibiotics Rash    Consultations:  Cardiology, Dr. Marlou Porch  Procedures/Studies: Dg Chest 2 View  Result Date: 07/13/2017 CLINICAL DATA:  Syncope with brief loss of consciousness for 30 seconds. Cyanosis. EXAM: CHEST  2 VIEW COMPARISON:  05/20/2017 FINDINGS: Status post CABG with stable cardiomegaly and aortic atherosclerosis. Upper lobe predominant emphysematous hyperinflation of the lungs with crowding of lower lobe interstitial lung  markings. Slightly more confluent airspace opacity abutting the minor fissure involving the right upper lobe. A small focus of pneumonia is not excluded versus atelectasis (favored). Osteoarthritis of both glenohumeral and AC joints. Thoracic spondylosis. IMPRESSION: 1. Cardiomegaly with upper lobe predominant emphysematous hyperinflation of the lungs. 2. Slightly more confluent airspace opacity involving the right upper lobe adjacent to the minor fissure cannot exclude a small focus of pneumonia versus atelectasis. Electronically Signed   By: Ashley Royalty M.D.   On: 07/13/2017 18:14   Subjective: Still has cough without dyspnea or chest pain. Urinary symptoms resolved. No further light headedness or dizziness.   Discharge Exam: BP (!) 170/66   Pulse 61   Temp 98.1 F (36.7 C) (Oral)   Resp (!) 26   Ht 5\' 10"  (1.778 m)   Wt 67.6 kg (149 lb)   SpO2 100%   BMI 21.38 kg/m   General: Pt is alert, awake, not in acute distress Cardiovascular: RRR, no murmur, gallop. No JVD or LE edema.  Respiratory: CTA bilaterally, no wheezing, no rhonchi Abdominal: Soft, NT, ND, bowel sounds + Extremities: No cyanosis, DP pulses 2+.  Labs: Basic Metabolic Panel:  Recent Labs Lab 07/13/17 1605 07/14/17 0803 07/15/17 0147  NA 139 140 138  K 4.0 3.9 4.4  CL 101 103 102  CO2 31 30 28   GLUCOSE 112* 112* 107*  BUN 31* 28* 22*  CREATININE 1.57* 1.45* 1.46*  CALCIUM 8.7* 8.6* 8.8*   Liver Function Tests:  Recent Labs Lab 07/13/17 2009 07/15/17 0147  AST 25 23  ALT 18 17  ALKPHOS 64 66  BILITOT 0.8 0.8  PROT 6.0* 5.2*  ALBUMIN 3.4* 3.0*   CBC:  Recent Labs Lab 07/13/17 1605 07/14/17 0803 07/15/17 0147  WBC 9.8 9.6 10.7*  HGB 14.3 13.2 13.9  HCT 43.7 41.1 42.7  MCV 86.2 86.5 86.8  PLT 192 188 198   Cardiac Enzymes:  Recent Labs Lab 07/13/17 1659  TROPONINI <0.03   Urinalysis    Component Value Date/Time   COLORURINE YELLOW 07/13/2017 1740   APPEARANCEUR HAZY (A) 07/13/2017  1740   LABSPEC 1.008 07/13/2017 1740   PHURINE 6.0 07/13/2017 1740   GLUCOSEU NEGATIVE 07/13/2017 1740   HGBUR NEGATIVE 07/13/2017 1740  BILIRUBINUR NEGATIVE 07/13/2017 1740   KETONESUR NEGATIVE 07/13/2017 1740   PROTEINUR NEGATIVE 07/13/2017 1740   UROBILINOGEN 1.0 04/24/2014 0108   NITRITE NEGATIVE 07/13/2017 1740   LEUKOCYTESUR LARGE (A) 07/13/2017 1740   Time coordinating discharge: Approximately 40 minutes  Vance Gather, MD  [facility] 07/15/2017, 11:11 AM Pager [phone]

## 2017-07-22 IMAGING — US US ABDOMEN COMPLETE
1 series · 13 of 25 positions shown · non-contrast
Comparison: None.

CLINICAL DATA: [AGE] male with elevated LFT and
hyperbilirubinemia and gross hematuria.

EXAM:
ABDOMEN ULTRASOUND COMPLETE

[Series 1: us abdomen complete · 0.25mm/px · 13 of 88 slices shown]
[im 1/88]
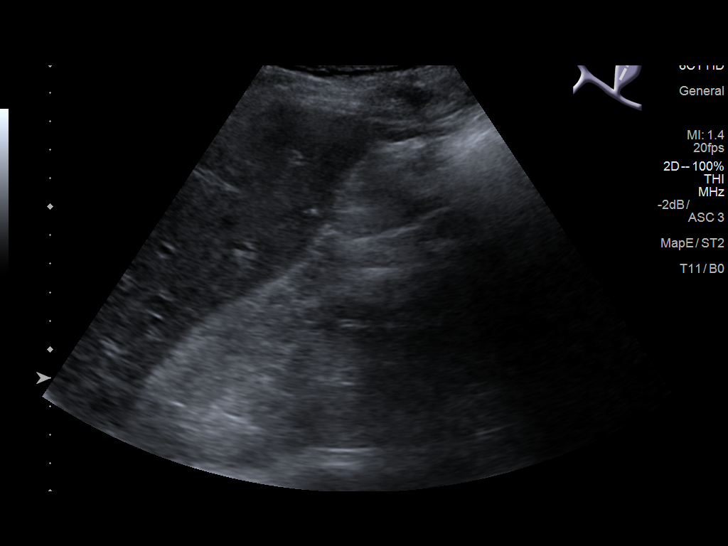
[im 8/88]
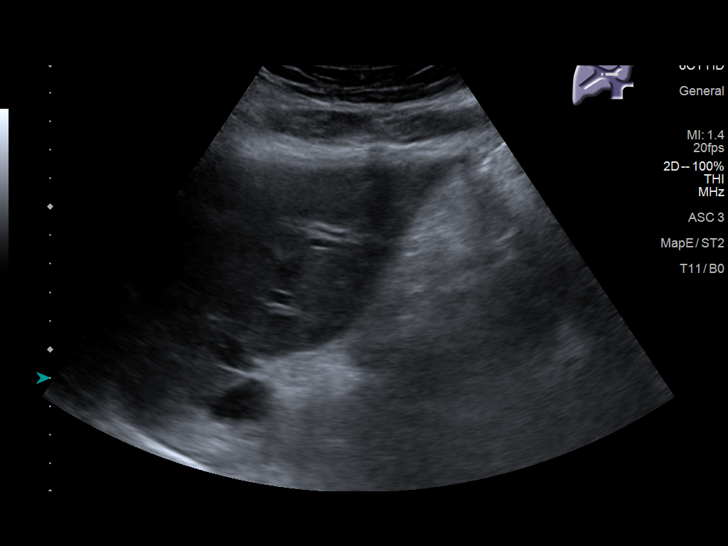
[im 15/88]
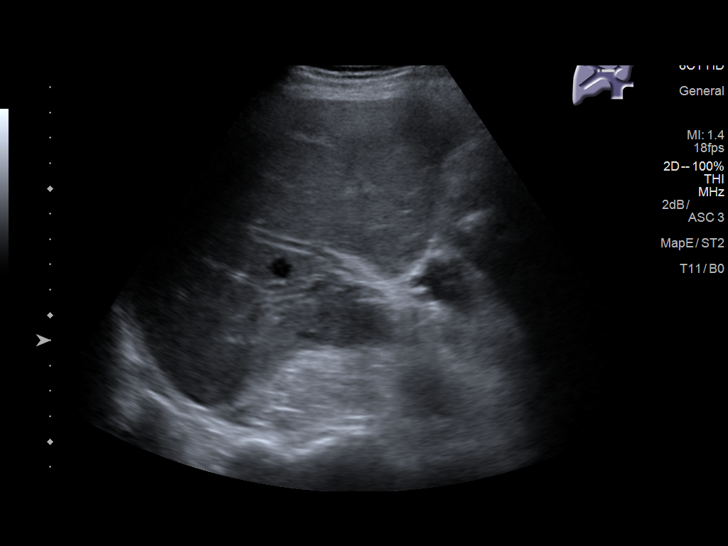
[im 22/88]
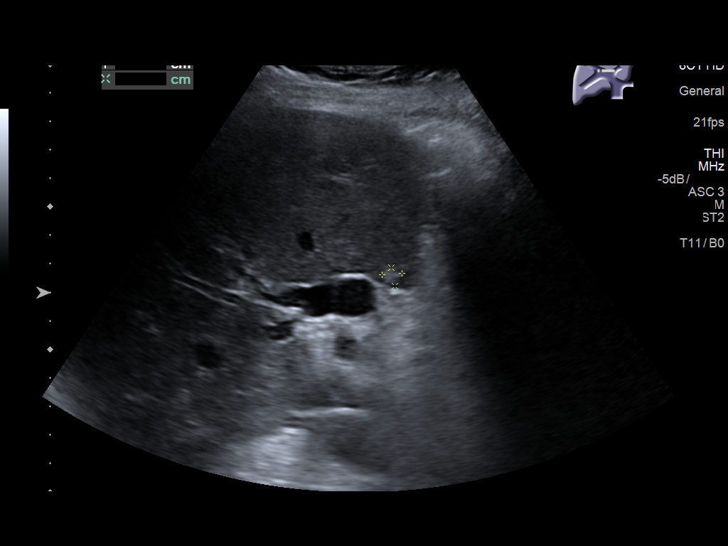
[im 30/88]
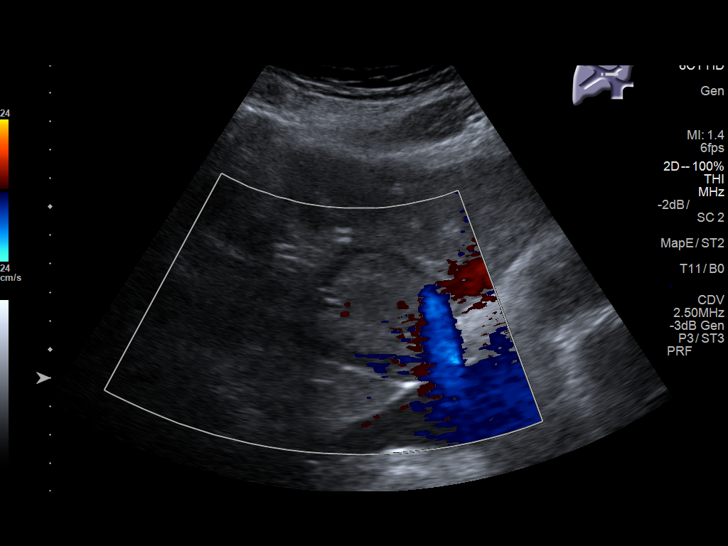
[im 37/88]
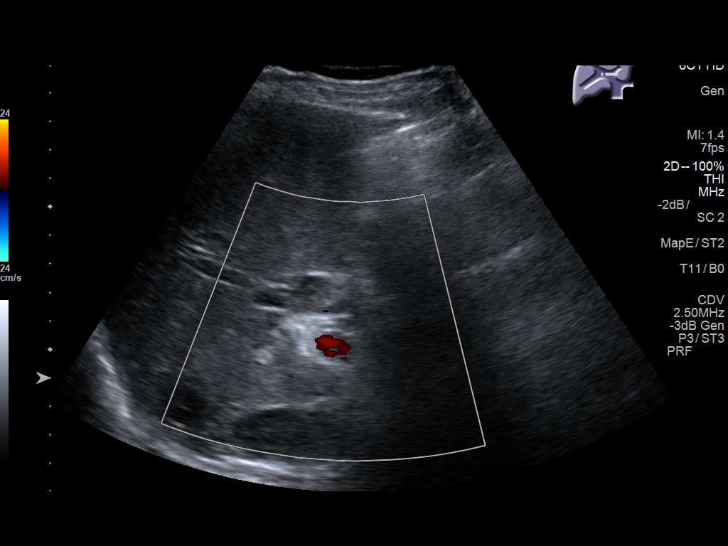
[im 44/88]
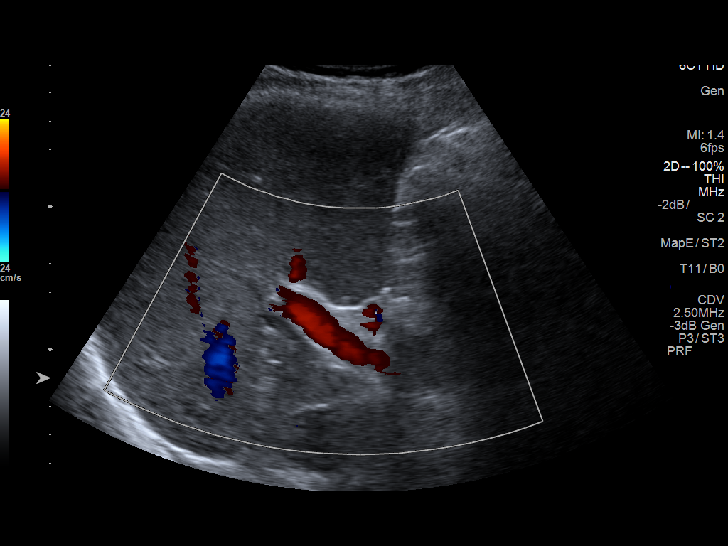
[im 51/88]
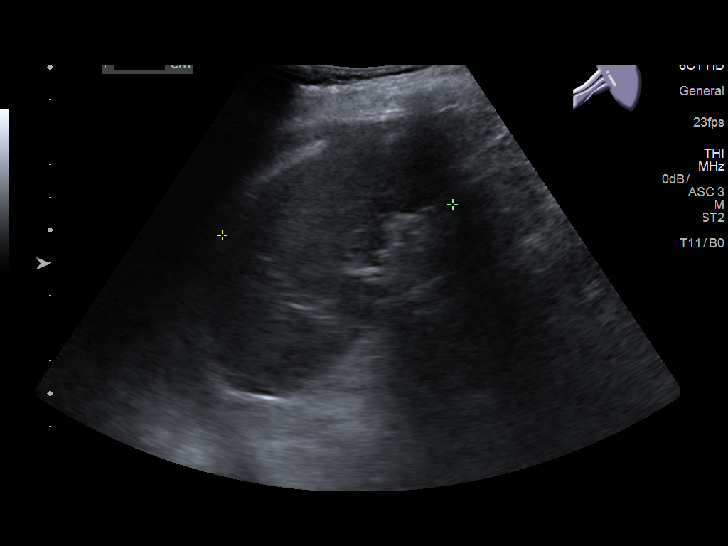
[im 59/88]
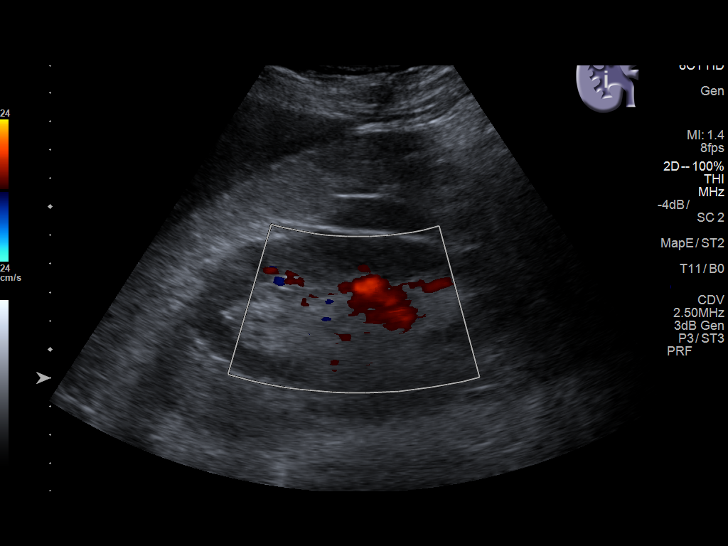
[im 66/88]
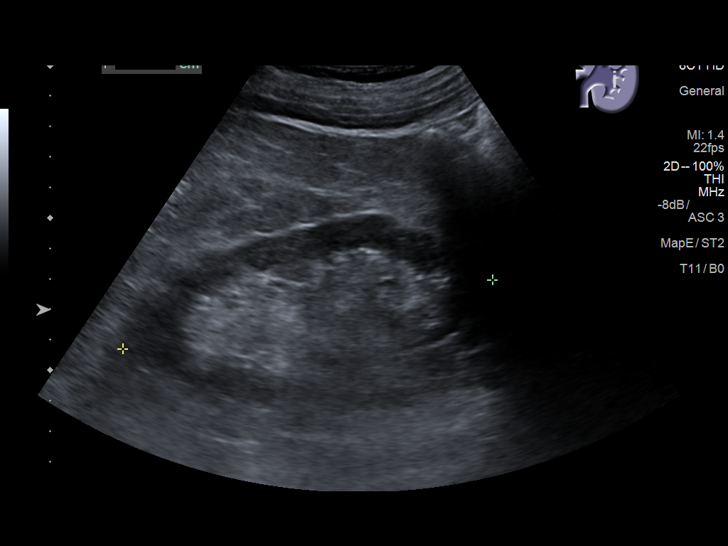
[im 73/88]
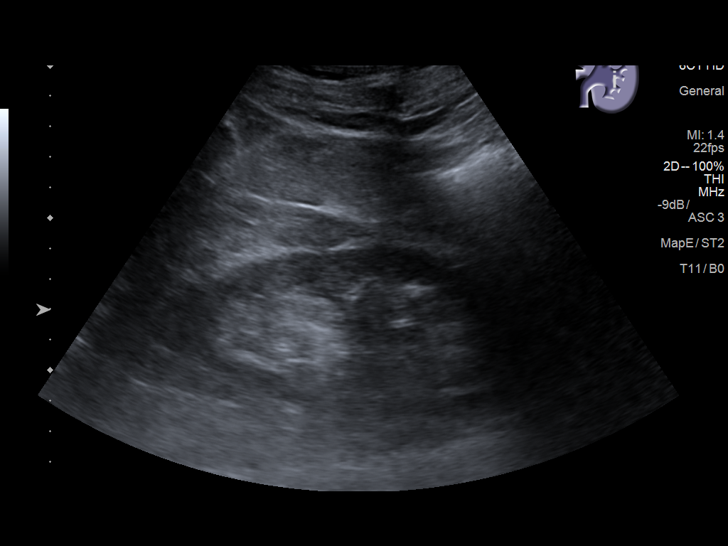
[im 80/88]
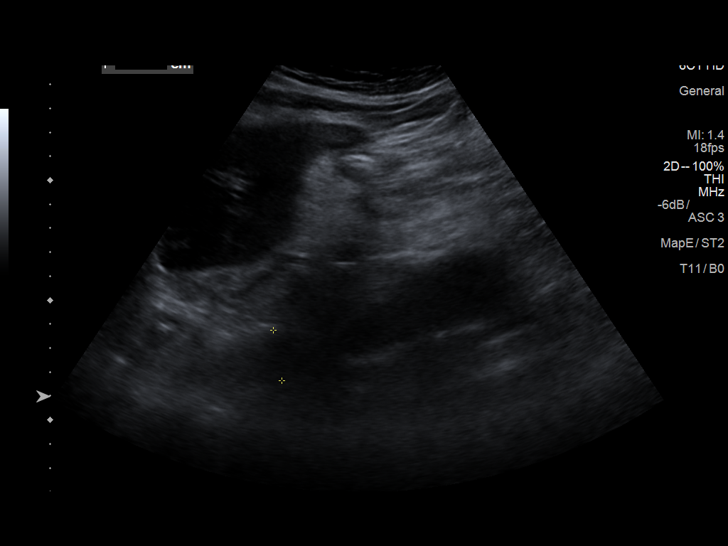
[im 88/88]
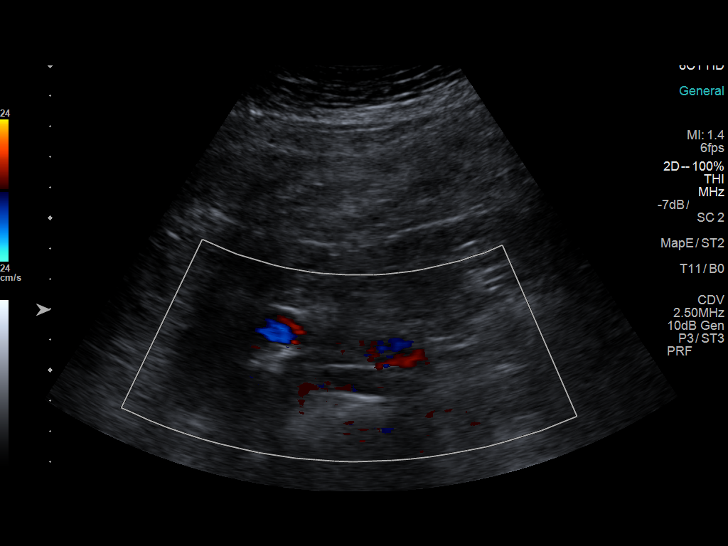

[13 of 25 positions shown; findings below may reference images not displayed]

FINDINGS: Gallbladder: Cholecystectomy.

Common bile duct: Diameter: 15 mm.

Liver: There is an 8 x 8 x 8 mm echogenic lesion in the right lobe
of the liver which is not well characterized but may represent a
hemangioma. Other etiologies are not excluded. MRI may provide
better characterization if clinically indicated. The liver is
otherwise unremarkable as visualized.

IVC: No abnormality visualized.

Pancreas: Poorly visualized and obscured by bowel gas.

Spleen: Size and appearance within normal limits.

Right Kidney: Length: 12.3 cm. Echogenicity within normal limits. No
hydronephrosis visualized.

Left Kidney: Length: 12.3 cm. Echogenicity within normal limits. No
hydronephrosis visualized. There is a 1.7 x 1.2 x 1.3 cm interpolar
cyst.

Abdominal aorta: There is atherosclerotic calcification of the
aorta. There is aneurysmal dilatation of the midportion of the
abdominal aorta measuring up to 3.7 cm.

Other findings: None.
IMPRESSION: 1. Subcentimeter right liver echogenic lesion, incompletely
characterized, possibly a hemangioma. MRI may provide better
characterization if clinically indicated.
2. Cholecystectomy.
3. No hydronephrosis or echogenic stone.
4. Abdominal aortic aneurysm measures 3.7 cm. Recommend followup by
ultrasound in 2 years. This recommendation follows ACR consensus
guidelines: White Paper of the ACR Incidental Findings Committee II
5.  Aortic Atherosclerosis (S6UQE-OEO.O).

## 2017-07-24 ENCOUNTER — Telehealth: Payer: Self-pay | Admitting: Cardiovascular Disease

## 2017-07-24 DIAGNOSIS — Z79899 Other long term (current) drug therapy: Secondary | ICD-10-CM

## 2017-07-24 MED ORDER — FUROSEMIDE 40 MG PO TABS: 40.0000 mg | ORAL_TABLET | Freq: Every day | ORAL | 3 refills | Status: DC | PRN

## 2017-07-24 NOTE — Telephone Encounter (Signed)
°  New Prob  Pt c/o Syncope: STAT if syncope occurred within 30 minutes and pt complains of lightheadedness High Priority if episode of passing out, completely, today or in last 24 hours   1. Did you pass out today? Yes  2. When is the last time you passed out? Approximately 30 minutes ago  3. Has this occurred multiple times? Yes  4. Did you have any symptoms prior to passing out? No

## 2017-07-24 NOTE — Telephone Encounter (Signed)
Daughter calling concerning Bruce Green's syncope  Notes he's had evaluation recently. EMS was called twice, notes usually once he gets to hospital he's ok  Family reports syncope 5 times in 7 days. Twice today. Usually "out of it" for 3-5 mins. daughter states he does remain fairly active for his age.  Syncope consistently happens with getting up and movement, though he is deliberate about taking his time with changing positions. notes it does not happen every time he gets up. Sometimes will get up, walk in his walker, get 10-15 steps and then pass out. sometimes he won't pass out but will get lightheaded. Most of the time, it does not happen, but she was concerned about it happening twice in 1 day.   BP & HR reads at home:  aug 22nd 142/70 HR 62 23rd 140/64 HR 58 24th 116/63 HR 61 25th 118/75 HR 62 26th 122/66 HR 60 27th 110/74  28th 116/67 29th 118/68 30th 127/76 31st 124/62  Does compression hose, elevates legs Reports insomnia tried trazodone which didn't help, tried melatonin which helped.  Notes he's on 40mg  lasix and "pees all the time". also on flomax.   Of note, he had recently had amiodarone reduced to 200mg  daily - around 2 weeks ago he went to this dose.  Daughter would like to know if reasonable to reduce this medication or if he can reduce lasix - states he has a little bit of ankle swelling but this has never been significant, was only a problem with his onset of atrial fibrillation.  Pt recently seen by Suanne Marker in office - will discuss.

## 2017-07-24 NOTE — Telephone Encounter (Signed)
Recommendations relayed to daughter. She reports compliance w midodrine, will make changes as indicated to lasix. He will try abdominal binder & keep HOB elevated.  Pt to come in next Friday as Rhonda recommended for BMET and orthostatic check by clinic nurse. Of note, he is already scheduled for a follow up w Suanne Marker on Sept 11th. Daughter wanted to know if they could wait on these interventions until time of scheduled visit. Informed daughter I would bring this to Rhonda's attention, see if acceptable to delay interventions until that visit.

## 2017-07-24 NOTE — Telephone Encounter (Signed)
Discussed w Suanne Marker. She made following recommendations:   Change lasix to 40mg  AS NEEDED (for weight gain of 3 lbs in 1 day or 5 lbs in 7 days)  Have patient come to heartcare office in 1 week for Orthostatics and BMET.  Ensure patient is taking midodrine as dosed, keeping head of bed elevated (should not lie flat ever, if possible) Recommendation of continued compliance with compression stockings Use of abdominal binder

## 2017-07-26 ENCOUNTER — Other Ambulatory Visit: Payer: Self-pay | Admitting: Cardiovascular Disease

## 2017-07-28 NOTE — Telephone Encounter (Signed)
It is ok to wait till the 11th if he is doing better. Thanks

## 2017-07-28 NOTE — Telephone Encounter (Signed)
Daughter advised, she states pt doing well this week but she will call if any concerns prior to visit - states OK to follow up next with Suanne Marker. For now, informed her I would cancel the nurse visit but that we could add him back on if needed. She voiced understanding and thanks.

## 2017-07-31 ENCOUNTER — Ambulatory Visit: Payer: Medicare Other

## 2017-08-04 ENCOUNTER — Ambulatory Visit (INDEPENDENT_AMBULATORY_CARE_PROVIDER_SITE_OTHER): Payer: Medicare Other | Admitting: Physician Assistant

## 2017-08-04 ENCOUNTER — Encounter: Payer: Self-pay | Admitting: Physician Assistant

## 2017-08-04 VITALS — BP 106/62 | HR 78 | Ht 70.0 in | Wt 150.0 lb

## 2017-08-04 DIAGNOSIS — I5022 Chronic systolic (congestive) heart failure: Secondary | ICD-10-CM | POA: Diagnosis not present

## 2017-08-04 DIAGNOSIS — I951 Orthostatic hypotension: Secondary | ICD-10-CM

## 2017-08-04 LAB — BASIC METABOLIC PANEL
BUN/Creatinine Ratio: 20 (ref 10–24)
BUN: 25 mg/dL (ref 10–36)
CHLORIDE: 103 mmol/L (ref 96–106)
CO2: 22 mmol/L (ref 20–29)
Calcium: 9.3 mg/dL (ref 8.6–10.2)
Creatinine, Ser: 1.26 mg/dL (ref 0.76–1.27)
GFR, EST AFRICAN AMERICAN: 55 mL/min/{1.73_m2} — AB (ref >59)
GFR, EST NON AFRICAN AMERICAN: 48 mL/min/{1.73_m2} — AB (ref >59)
Glucose: 168 mg/dL — ABNORMAL HIGH (ref 65–99)
POTASSIUM: 4.2 mmol/L (ref 3.5–5.2)
SODIUM: 141 mmol/L (ref 134–144)

## 2017-08-04 MED ORDER — FUROSEMIDE 40 MG PO TABS: 20.0000 mg | ORAL_TABLET | Freq: Every day | ORAL | 2 refills | Status: AC | PRN

## 2017-08-04 NOTE — Progress Notes (Signed)
Cardiology Office Note   Date:  08/04/2017   ID:  Bruce Green, DOB 1919-04-17, MRN [id number]  PCP:  Kathyrn Lass, MD  Cardiologist:  Dr. Sallyanne Kuster 09/17/2016 Bruce Ferries, PA-C 06/22/2017  Chief Complaint  Patient presents with  . Follow-up    History of Present Illness: Bruce Green is a 83 y.o. male with a history of CAD/CABG 2016 with improved EF to 45%, HTN, GERD, PNA, abnl LFTs.  Admit 04/2017 hematuria, rapid atrial fib, CBD stone (ERCP deferred until he can come off anticoagulation), PNA. Long QTc felt 2nd amio. Pt had TEE/DCCV 05/19/2017. 07/30 office visit, patient doing well with the Eliquis but with orthostatic hypotension, midodrine restart recommended, asterixis noted, improved with decrease in amiodarone dose  Admitted 08/20-08/22/2018 for syncope, midodrine restarted and symptoms improved  Bruce Green presents for cardiology follow up.  His weight is up 7 lbs on our scales but has trended up gradually on his home scales, he is eating better. His family believes he has gained weight. He has no LE edema. He is sleeping better. He is exercising daily and doing well with this.  He denies orthopnea or PND.  He is still having problems with twitching, it is improved but is still obvious on exam.   He gets dizzy when he first gets up in the morning but it resolves fairly quickly. He is careful and has not had any more falls.  He denies dizziness at other times. He is still using a rollator and that helps him keep his balance.  He has caregivers, they walk with him. He is compliant with his medications. His food is prepared carefully, within low sodium parameters. He does not over drink.   Past Medical History:  Diagnosis Date  . Anxiety   . Cataract   . CKD (chronic kidney disease)   . Colon polyp   . Coronary artery disease 04/21/2014     severe triple vessel  . Dyslipidemia   . GERD (gastroesophageal reflux disease)   . Hiatal hernia    6cm    . Ischemic cardiomyopathy 03/2014   EF 25-30%  . Pneumonia   . Stricture esophagus    distal  . TIA (transient ischemic attack)   . Vitamin D deficiency     Past Surgical History:  Procedure Laterality Date  . APPENDECTOMY    . CARDIAC SURGERY    . CARDIOVERSION N/A 05/19/2017   Procedure: CARDIOVERSION;  Surgeon: Larey Dresser, MD;  Location: [facility] ENDOSCOPY;  Service: Cardiovascular;  Laterality: N/A;  . CHOLECYSTECTOMY    . CORONARY ARTERY BYPASS GRAFT N/A 04/24/2014   Procedure: CORONARY ARTERY BYPASS GRAFTING (CABG);  Surgeon: Gaye Pollack, MD;  Location: [place];  Service: Open Heart Surgery;  Laterality: N/A;  Times 3 using left internal mammary artery and endoscopically harvested right saphenous vein  . ESOPHAGOGASTRODUODENOSCOPY  04/09/2012   Procedure: ESOPHAGOGASTRODUODENOSCOPY (EGD);  Surgeon: Gatha Mayer, MD;  Location: [facility] ENDOSCOPY;  Service: Endoscopy;  Laterality: N/A;  . INTRAOPERATIVE TRANSESOPHAGEAL ECHOCARDIOGRAM N/A 04/24/2014   Procedure: INTRAOPERATIVE TRANSESOPHAGEAL ECHOCARDIOGRAM;  Surgeon: Gaye Pollack, MD;  Location: [place] OR;  Service: Open Heart Surgery;  Laterality: N/A;  . LEFT AND RIGHT HEART CATHETERIZATION WITH CORONARY ANGIOGRAM N/A 04/21/2014   Procedure: LEFT AND RIGHT HEART CATHETERIZATION WITH CORONARY ANGIOGRAM;  Surgeon: Burnell Blanks, MD;  Location: [facility] CATH LAB;  Service: Cardiovascular;  Laterality: N/A;  . SHOULDER SURGERY    . TEE WITHOUT CARDIOVERSION N/A 05/19/2017  Procedure: TRANSESOPHAGEAL ECHOCARDIOGRAM (TEE);  Surgeon: Larey Dresser, MD;  Location: [facility] ENDOSCOPY;  Service: Cardiovascular;  Laterality: N/A;    Current Outpatient Prescriptions  Medication Sig Dispense Refill  . acetaminophen (TYLENOL) 500 MG tablet Take 1,000 mg by mouth every 6 (six) hours as needed for moderate pain.     Marland Kitchen amiodarone (PACERONE) 200 MG tablet Take 1 tablet (200 mg total) by mouth daily. 30 tablet 0  . apixaban (ELIQUIS) 2.5 MG TABS tablet  Take 1 tablet (2.5 mg total) by mouth 2 (two) times daily. (Patient taking differently: Take 2.5 mg by mouth daily. ) 180 tablet 1  . atorvastatin (LIPITOR) 80 MG tablet Take 40 mg by mouth daily.    . B Complex-C (B-COMPLEX WITH VITAMIN C) tablet Take 1 tablet by mouth daily.    . Cholecalciferol (VITAMIN D3) 5000 units TABS Take 1 tablet by mouth every other day.    . folic acid (FOLVITE) 1 MG tablet Take 400 mcg by mouth daily.     . furosemide (LASIX) 40 MG tablet Take 1 tablet (40 mg total) by mouth daily as needed. (Patient taking differently: Take 20 mg by mouth daily as needed. ) 90 tablet 3  . loratadine (CLARITIN) 10 MG tablet Take 1 tablet by mouth daily.    . Magnesium 400 MG CAPS Take 400 mg by mouth every other day.    . Melatonin 5 MG TABS Take 5-10 mg by mouth as needed (sleep).    . midodrine (PROAMATINE) 5 MG tablet Take 1 tablet (5 mg total) by mouth 3 (three) times daily with meals. 90 tablet 0  . Multiple Vitamins-Minerals (MULTIVITAMIN WITH MINERALS) tablet Take 1 tablet by mouth daily.    . pantoprazole (PROTONIX) 40 MG tablet TAKE 1 TABLET (40 MG TOTAL) BY MOUTH DAILY BEFORE BREAKFAST. 90 tablet 0  . polyethylene glycol (MIRALAX / GLYCOLAX) packet Take 17 g by mouth daily as needed for mild constipation. 14 each 0  . potassium chloride SA (K-DUR,KLOR-CON) 20 MEQ tablet Take 1 tablet (20 mEq total) by mouth daily. TAKE WHEN TAKING YOUR LASIX 30 tablet 5  . Probiotic Product (PROBIOTIC PO) Take 1 tablet by mouth daily.    . tamsulosin (FLOMAX) 0.4 MG CAPS capsule Take 1 capsule (0.4 mg total) by mouth daily after supper. 30 capsule    No current facility-administered medications for this visit.     Allergies:   Sulfa antibiotics    Social History:  The patient  reports that he has never smoked. He has never used smokeless tobacco. He reports that he drinks about 0.6 oz of alcohol per week . He reports that he does not use drugs.   Family History:  The patient's family  history includes Aneurysm in his brother; Arthritis in his sister; Breast cancer in his sister; Colon cancer in his brother; Dementia in his brother; Heart attack in his mother; Kidney disease in his father; Osteoporosis in his sister.    ROS:  Please see the history of present illness. All other systems are reviewed and negative.    PHYSICAL EXAM: VS:  BP 106/62   Pulse 78   Ht 5\' 10"  (1.778 m)   Wt 150 lb (68 kg)   BMI 21.52 kg/m  , BMI Body mass index is 21.52 kg/m. GEN: Well nourished, well developed, male in no acute distress  HEENT: normal for age  Neck: minimal JVD, no carotid bruit, no masses Cardiac: RRR; soft murmur, no rubs, or gallops Respiratory:  Scattered dry rales bilaterally, good air exchange, normal work of breathing GI: soft, nontender, nondistended, + BS MS: no deformity or atrophy; no edema; distal pulses are 2+ in all 4 extremities   Skin: warm and dry, no rash Neuro:  Strength and sensation are intact Psych: euthymic mood, full affect   EKG:  EKG is not ordered today.  Recent Labs: 05/14/2017: B Natriuretic Peptide 850.4; TSH 0.790 06/22/2017: Magnesium 2.4 07/15/2017: ALT 17; BUN 22; Creatinine, Ser 1.46; Hemoglobin 13.9; Platelets 198; Potassium 4.4; Sodium 138    Lipid Panel    Component Value Date/Time   CHOL 147 10/09/2016 0920   TRIG 234 (H) 10/09/2016 0920   HDL 39 (L) 10/09/2016 0920   CHOLHDL 3.8 10/09/2016 0920   VLDL 47 (H) 10/09/2016 0920   LDLCALC 61 10/09/2016 0920     Wt Readings from Last 3 Encounters:  08/04/17 150 lb (68 kg)  07/15/17 149 lb (67.6 kg)  06/22/17 143 lb (64.9 kg)     Other studies Reviewed: Additional studies/ records that were reviewed today include: office notes, hospital records and testing.  ASSESSMENT AND PLAN:  1.  Orthostatic hypotension: It is improved on the midodrine. However, his BP runs in 99'J systolic at times, will decrease the Lasix to allow his BP to float a bit higher. He has a small amount  of ETOH in the evenings. His MDs have (appropriately) recommended he stop this. He is extremely reluctant to wear compression stockings because he had some in the past that were too tight.   I have agreed that he may have a small amount of ETOH every evening, if he will wear compression stockings during the day. If the change in Lasix does not improve his symptoms, he may need more midodrine.  2. Chronic systolic CHF: His weight is trending up, but it is gradual on his home weight record. He is eating better and may have gained some actual weight. He is very compliant with his low-sodium diet, his food is prepared by others. I have changed his Lasix to prn only. The family and caregivers understand the parameters for his Lasix. They are very vigilant in his care.   Current medicines are reviewed at length with the patient today.  The patient does not have concerns regarding medicines.  The following changes have been made:  Change Lasix to prn  Labs/ tests ordered today include:  No orders of the defined types were placed in this encounter.   Disposition:   FU with Dr. Sallyanne Kuster  Signed, Lenoard Aden  08/04/2017 10:26 AM    [place] Phone: [phone]; Fax: [phone]  This note was written with the assistance of speech recognition software. Please excuse any transcriptional errors.

## 2017-08-04 NOTE — Patient Instructions (Signed)
Medication Instructions:  LASIX 20MG  AS NEEDED FOR WT GAIN >3 LBS IN 1 DAY -OR- >5 LBS IN ONE WEEK If you need a refill on your cardiac medications before your next appointment, please call your pharmacy.  Labwork: BMET TODAY HERE IN OUR OFFICE AT [facility]  Follow-Up: Your physician wants you to follow-up on: AS PLANNED WITH DR ATFTDDUK, 09-12-17 @ 9AM.  Y  Special Instructions: LOW SODIUM DIET  WEAR OVER-THE-COUNTER COMPRESSION STOCKINGS   Thank you for choosing [facility] at [employer]!!     LOW SODIUM DIET DASH Eating Plan DASH stands for "Dietary Approaches to Stop Hypertension." The DASH eating plan is a healthy eating plan that has been shown to reduce high blood pressure (hypertension). It may also reduce your risk for type 2 diabetes, heart disease, and stroke. The DASH eating plan may also help with weight loss. What are tips for following this plan? General guidelines  Avoid eating more than 2,300 mg (milligrams) of salt (sodium) a day. If you have hypertension, you may need to reduce your sodium intake to 1,500 mg a day.  Limit alcohol intake to no more than 1 drink a day for nonpregnant women and 2 drinks a day for men. One drink equals 12 oz of beer, 5 oz of wine, or 1 oz of hard liquor.  Work with your health care provider to maintain a healthy body weight or to lose weight. Ask what an ideal weight is for you.  Get at least 30 minutes of exercise that causes your heart to beat faster (aerobic exercise) most days of the week. Activities may include walking, swimming, or biking.  Work with your health care provider or diet and nutrition specialist (dietitian) to adjust your eating plan to your individual calorie needs. Reading food labels  Check food labels for the amount of sodium per serving. Choose foods with less than 5 percent of the Daily Value of sodium. Generally, foods with less than 300 mg of sodium per serving fit into this eating plan.  To find  whole grains, look for the word "whole" as the first word in the ingredient list. Shopping  Buy products labeled as "low-sodium" or "no salt added."  Buy fresh foods. Avoid canned foods and premade or frozen meals. Cooking  Avoid adding salt when cooking. Use salt-free seasonings or herbs instead of table salt or sea salt. Check with your health care provider or pharmacist before using salt substitutes.  Do not fry foods. Cook foods using healthy methods such as baking, boiling, grilling, and broiling instead.  Cook with heart-healthy oils, such as olive, canola, soybean, or sunflower oil. Meal planning   Eat a balanced diet that includes: ? 5 or more servings of fruits and vegetables each day. At each meal, try to fill half of your plate with fruits and vegetables. ? Up to 6-8 servings of whole grains each day. ? Less than 6 oz of lean meat, poultry, or fish each day. A 3-oz serving of meat is about the same size as a deck of cards. One egg equals 1 oz. ? 2 servings of low-fat dairy each day. ? A serving of nuts, seeds, or beans 5 times each week. ? Heart-healthy fats. Healthy fats called Omega-3 fatty acids are found in foods such as flaxseeds and coldwater fish, like sardines, salmon, and mackerel.  Limit how much you eat of the following: ? Canned or prepackaged foods. ? Food that is high in trans fat, such as fried foods. ?  Food that is high in saturated fat, such as fatty meat. ? Sweets, desserts, sugary drinks, and other foods with added sugar. ? Full-fat dairy products.  Do not salt foods before eating.  Try to eat at least 2 vegetarian meals each week.  Eat more home-cooked food and less restaurant, buffet, and fast food.  When eating at a restaurant, ask that your food be prepared with less salt or no salt, if possible. What foods are recommended? The items listed may not be a complete list. Talk with your dietitian about what dietary choices are best for  you. Grains Whole-grain or whole-wheat bread. Whole-grain or whole-wheat pasta. Brown rice. [place] Morrow. Bulgur. Whole-grain and low-sodium cereals. Pita bread. Low-fat, low-sodium crackers. Whole-wheat flour tortillas. Vegetables Fresh or frozen vegetables (raw, steamed, roasted, or grilled). Low-sodium or reduced-sodium tomato and vegetable juice. Low-sodium or reduced-sodium tomato sauce and tomato paste. Low-sodium or reduced-sodium canned vegetables. Fruits All fresh, dried, or frozen fruit. Canned fruit in natural juice (without added sugar). Meat and other protein foods Skinless chicken or [place]. Ground chicken or [place]. Pork with fat trimmed off. Fish and seafood. Egg whites. Dried beans, peas, or lentils. Unsalted nuts, nut butters, and seeds. Unsalted canned beans. Lean cuts of beef with fat trimmed off. Low-sodium, lean deli meat. Dairy Low-fat (1%) or fat-free (skim) milk. Fat-free, low-fat, or reduced-fat cheeses. Nonfat, low-sodium ricotta or cottage cheese. Low-fat or nonfat yogurt. Low-fat, low-sodium cheese. Fats and oils Soft margarine without trans fats. Vegetable oil. Low-fat, reduced-fat, or light mayonnaise and salad dressings (reduced-sodium). Canola, safflower, olive, soybean, and sunflower oils. Avocado. Seasoning and other foods Herbs. Spices. Seasoning mixes without salt. Unsalted popcorn and pretzels. Fat-free sweets. What foods are not recommended? The items listed may not be a complete list. Talk with your dietitian about what dietary choices are best for you. Grains Baked goods made with fat, such as croissants, muffins, or some breads. Dry pasta or rice meal packs. Vegetables Creamed or fried vegetables. Vegetables in a cheese sauce. Regular canned vegetables (not low-sodium or reduced-sodium). Regular canned tomato sauce and paste (not low-sodium or reduced-sodium). Regular tomato and vegetable juice (not low-sodium or reduced-sodium). Angie Fava.  Olives. Fruits Canned fruit in a light or heavy syrup. Fried fruit. Fruit in cream or butter sauce. Meat and other protein foods Fatty cuts of meat. Ribs. Fried meat. Berniece Salines. Sausage. Bologna and other processed lunch meats. Salami. Fatback. Hotdogs. Bratwurst. Salted nuts and seeds. Canned beans with added salt. Canned or smoked fish. Whole eggs or egg yolks. Chicken or [place] with skin. Dairy Whole or 2% milk, cream, and half-and-half. Whole or full-fat cream cheese. Whole-fat or sweetened yogurt. Full-fat cheese. Nondairy creamers. Whipped toppings. Processed cheese and cheese spreads. Fats and oils Butter. Stick margarine. Lard. Shortening. Ghee. Bacon fat. Tropical oils, such as coconut, palm kernel, or palm oil. Seasoning and other foods Salted popcorn and pretzels. Onion salt, garlic salt, seasoned salt, table salt, and sea salt. Worcestershire sauce. Tartar sauce. Barbecue sauce. Teriyaki sauce. Soy sauce, including reduced-sodium. Steak sauce. Canned and packaged gravies. Fish sauce. Oyster sauce. Cocktail sauce. Horseradish that you find on the shelf. Ketchup. Mustard. Meat flavorings and tenderizers. Bouillon cubes. Hot sauce and Tabasco sauce. Premade or packaged marinades. Premade or packaged taco seasonings. Relishes. Regular salad dressings. Where to find more information:  National Heart, Lung, and [place]: https://wilson-eaton.com/  American Heart Association: www.heart.org Summary  The DASH eating plan is a healthy eating plan that has been shown to reduce high blood  pressure (hypertension). It may also reduce your risk for type 2 diabetes, heart disease, and stroke.  With the DASH eating plan, you should limit salt (sodium) intake to 2,300 mg a day. If you have hypertension, you may need to reduce your sodium intake to 1,500 mg a day.  When on the DASH eating plan, aim to eat more fresh fruits and vegetables, whole grains, lean proteins, low-fat dairy, and heart-healthy  fats.  Work with your health care provider or diet and nutrition specialist (dietitian) to adjust your eating plan to your individual calorie needs. This information is not intended to replace advice given to you by your health care provider. Make sure you discuss any questions you have with your health care provider. Document Released: 10/30/2011 Document Revised: 11/03/2016 Document Reviewed: 11/03/2016 Elsevier Interactive Patient Education  2017 [employer].

## 2017-08-05 ENCOUNTER — Telehealth: Payer: Self-pay | Admitting: Cardiovascular Disease

## 2017-08-05 NOTE — Telephone Encounter (Signed)
Letter placed in MD mail box

## 2017-08-05 NOTE — Telephone Encounter (Signed)
Patient daughter [name] [facility]) walked in with questions about patient's medications after OV with [place] on 9/11. She states hi lasix was changed to PRN for weight gain and she was unsure what to do w/K+ as instructions on pill bottle say to take when he takes lasix. exmplained that lasix is PRN or as needed and if he needs to take lasix, he would then take K+ with this. She voiced understanding. No further med c questions. She did ask for a blank piece of paper and envelope to right Dr. Sallyanne Kuster a letter. Will route to [place], CMA as [employer]

## 2017-08-28 ENCOUNTER — Other Ambulatory Visit: Payer: Self-pay

## 2017-08-28 MED ORDER — PANTOPRAZOLE SODIUM 40 MG PO TBEC: DELAYED_RELEASE_TABLET | ORAL | 3 refills | Status: DC

## 2017-09-21 ENCOUNTER — Encounter: Payer: Self-pay | Admitting: Cardiovascular Disease

## 2017-09-21 ENCOUNTER — Ambulatory Visit (INDEPENDENT_AMBULATORY_CARE_PROVIDER_SITE_OTHER): Payer: Medicare Other | Admitting: Cardiovascular Disease

## 2017-09-21 VITALS — BP 103/51 | HR 72 | Ht 70.0 in | Wt 150.0 lb

## 2017-09-21 DIAGNOSIS — E782 Mixed hyperlipidemia: Secondary | ICD-10-CM

## 2017-09-21 DIAGNOSIS — I951 Orthostatic hypotension: Secondary | ICD-10-CM

## 2017-09-21 DIAGNOSIS — Z5181 Encounter for therapeutic drug level monitoring: Secondary | ICD-10-CM | POA: Diagnosis not present

## 2017-09-21 DIAGNOSIS — I2510 Atherosclerotic heart disease of native coronary artery without angina pectoris: Secondary | ICD-10-CM | POA: Diagnosis not present

## 2017-09-21 DIAGNOSIS — I480 Paroxysmal atrial fibrillation: Secondary | ICD-10-CM

## 2017-09-21 DIAGNOSIS — I5020 Unspecified systolic (congestive) heart failure: Secondary | ICD-10-CM

## 2017-09-21 DIAGNOSIS — Z7901 Long term (current) use of anticoagulants: Secondary | ICD-10-CM | POA: Diagnosis not present

## 2017-09-21 DIAGNOSIS — Z79899 Other long term (current) drug therapy: Secondary | ICD-10-CM | POA: Diagnosis not present

## 2017-09-21 MED ORDER — AMIODARONE HCL 200 MG PO TABS: 200.0000 mg | ORAL_TABLET | Freq: Every day | ORAL | 3 refills | Status: AC

## 2017-09-21 NOTE — Progress Notes (Signed)
ROS  Patient ID: Bruce Green, male   DOB: 02/19/1919, 83 y.o.   MRN: [id number] Patient ID: Bruce Green, male   DOB: Jun 21, 1919, 83 y.o.   MRN: [id number] Patient ID: Bruce Green, male   DOB: [birth year], 83 y.o.   MRN: [id number]    Cardiology Office Note    Date:  09/21/2017   ID:  Bruce Green, DOB 1919/04/18, MRN [id number]  PCP:  Kathyrn Lass, MD  Cardiologist:   Sanda Klein, MD   No chief complaint on file.   History of Present Illness:  Bruce Green is a 83 y.o. male with coronary artery disease roughly 3 years after multivessel bypass surgery which led to marked improvement in left ventricular systolic function (EF 40-34 %, increased to about 45% post CABG, reduced to 25% during AFib and acute illness in June 2018), severe orthostatic hypotension, hyperlipidemia.    He underwent TEE guided cardioversion for atrial fibrillation in June 2018.  His TEE showed evidence of grade 4 aortic atherosclerosis and severe mitral regurgitation (the transthoracic study showed only moderate mitral regurgitation).  Small PFO was incidentally recorded  This is last appointment he has had 2 more syncopal events, both associated with standing up too quickly.  His home scale shows a very steady weight in the 147-150 range.  It appears that his home scale in our office scale are closely matched.  At home his blood pressure today was only 90/56 mmHg, even after taking his morning dose of Midrin.  He rarely needs to take furosemide, but his daughter did give him diuretics for about 3 days last week because of "rattling in his chest".  He did not have edema and it is not clear whether he had orthopnea.  His weight really was not higher than it is today.  His memory does appear to be deteriorating.  His daughter gently fills in any gaps.  The patient seems to be primarily troubled by severe coccydynia and keeps coming back to this problem again and again.  His daughter has given him a pillow.   She thinks part of the problem is he is always reclining in the same chair putting a lot of pressure on his rear end.  Denies angina, dyspnea, focal neurological events and is not aware of palpitations.  He does not have claudication.  He never has edema.  He has not had any bleeding problems, change in bowel pattern, abdominal pain, fever, chills, cough, hemoptysis, change in vision or auditory acuity, intolerance to heat or cold, sweats, flushing, insomnia, mood swings.    Past Medical History:  Diagnosis Date  . Anxiety   . Cataract   . CKD (chronic kidney disease)   . Colon polyp   . Coronary artery disease 04/21/2014     severe triple vessel  . Dyslipidemia   . GERD (gastroesophageal reflux disease)   . Hiatal hernia    6cm  . Ischemic cardiomyopathy 03/2014   EF 25-30%  . Pneumonia   . Stricture esophagus    distal  . TIA (transient ischemic attack)   . Vitamin D deficiency     Past Surgical History:  Procedure Laterality Date  . APPENDECTOMY    . CARDIAC SURGERY    . CARDIOVERSION N/A 05/19/2017   Procedure: CARDIOVERSION;  Surgeon: Larey Dresser, MD;  Location: [facility] ENDOSCOPY;  Service: Cardiovascular;  Laterality: N/A;  . CHOLECYSTECTOMY    . CORONARY ARTERY BYPASS GRAFT N/A 04/24/2014   Procedure: CORONARY ARTERY  BYPASS GRAFTING (CABG);  Surgeon: Gaye Pollack, MD;  Location: [facility];  Service: Open Heart Surgery;  Laterality: N/A;  Times 3 using left internal mammary artery and endoscopically harvested right saphenous vein  . ESOPHAGOGASTRODUODENOSCOPY  04/09/2012   Procedure: ESOPHAGOGASTRODUODENOSCOPY (EGD);  Surgeon: Gatha Mayer, MD;  Location: [facility] ENDOSCOPY;  Service: Endoscopy;  Laterality: N/A;  . INTRAOPERATIVE TRANSESOPHAGEAL ECHOCARDIOGRAM N/A 04/24/2014   Procedure: INTRAOPERATIVE TRANSESOPHAGEAL ECHOCARDIOGRAM;  Surgeon: Gaye Pollack, MD;  Location: [facility] OR;  Service: Open Heart Surgery;  Laterality: N/A;  . LEFT AND RIGHT HEART CATHETERIZATION WITH CORONARY  ANGIOGRAM N/A 04/21/2014   Procedure: LEFT AND RIGHT HEART CATHETERIZATION WITH CORONARY ANGIOGRAM;  Surgeon: Burnell Blanks, MD;  Location: [facility] CATH LAB;  Service: Cardiovascular;  Laterality: N/A;  . SHOULDER SURGERY    . TEE WITHOUT CARDIOVERSION N/A 05/19/2017   Procedure: TRANSESOPHAGEAL ECHOCARDIOGRAM (TEE);  Surgeon: Larey Dresser, MD;  Location: [facility] ENDOSCOPY;  Service: Cardiovascular;  Laterality: N/A;    Outpatient Medications Prior to Visit  Medication Sig Dispense Refill  . acetaminophen (TYLENOL) 500 MG tablet Take 1,000 mg by mouth every 6 (six) hours as needed for moderate pain.     Marland Kitchen atorvastatin (LIPITOR) 80 MG tablet Take 40 mg by mouth daily.    . B Complex-C (B-COMPLEX WITH VITAMIN C) tablet Take 1 tablet by mouth daily.    . Cholecalciferol (VITAMIN D3) 5000 units TABS Take 1 tablet by mouth every other day.    . folic acid (FOLVITE) 1 MG tablet Take 400 mcg by mouth daily.     . furosemide (LASIX) 40 MG tablet Take 0.5 tablets (20 mg total) by mouth daily as needed. FOR WT GAIN >3# IN 1 DAY  -OR- >5# IN A WEEK 30 tablet 2  . loratadine (CLARITIN) 10 MG tablet Take 1 tablet by mouth daily.    . Magnesium 400 MG CAPS Take 400 mg by mouth every other day.    . Melatonin 5 MG TABS Take 5-10 mg by mouth as needed (sleep).    . midodrine (PROAMATINE) 5 MG tablet Take 1 tablet (5 mg total) by mouth 3 (three) times daily with meals. 90 tablet 0  . Multiple Vitamins-Minerals (MULTIVITAMIN WITH MINERALS) tablet Take 1 tablet by mouth daily.    . pantoprazole (PROTONIX) 40 MG tablet TAKE 1 TABLET (40 MG TOTAL) BY MOUTH DAILY BEFORE BREAKFAST. 90 tablet 3  . polyethylene glycol (MIRALAX / GLYCOLAX) packet Take 17 g by mouth daily as needed for mild constipation. 14 each 0  . potassium chloride SA (K-DUR,KLOR-CON) 20 MEQ tablet Take 1 tablet (20 mEq total) by mouth daily. TAKE WHEN TAKING YOUR LASIX 30 tablet 5  . Probiotic Product (PROBIOTIC PO) Take 1 tablet by mouth daily.     . tamsulosin (FLOMAX) 0.4 MG CAPS capsule Take 1 capsule (0.4 mg total) by mouth daily after supper. 30 capsule   . amiodarone (PACERONE) 200 MG tablet Take 1 tablet (200 mg total) by mouth daily. 30 tablet 0  . apixaban (ELIQUIS) 2.5 MG TABS tablet Take 1 tablet (2.5 mg total) by mouth 2 (two) times daily. (Patient not taking: Reported on 09/21/2017) 180 tablet 1   No facility-administered medications prior to visit.      Allergies:   Sulfa antibiotics   Social History   Social History  . Marital status: Widowed    Spouse name: N/A  . Number of children: 2  . Years of education: N/A   Occupational History  .  retired    Social History Main Topics  . Smoking status: Never Smoker  . Smokeless tobacco: Never Used  . Alcohol use 0.6 oz/week    1 Standard drinks or equivalent per week     Comment: rare  . Drug use: No  . Sexual activity: Not Asked   Other Topics Concern  . None   Social History Narrative   Retired Conservation officer, historic buildings. Widowed. Was in [place] and [place]. He has 2 daughters and several grandchildren.            Family History:  The patient's family history includes Aneurysm in his brother; Arthritis in his sister; Breast cancer in his sister; Colon cancer in his brother; Dementia in his brother; Heart attack in his mother; Kidney disease in his father; Osteoporosis in his sister.   ROS:   Please see the history of present illness.    ROS All other systems reviewed and are negative.   PHYSICAL EXAM:   VS:  BP (!) 103/51   Pulse 72   Ht 5\' 10"  (1.778 m)   Wt 150 lb (68 kg)   BMI 21.52 kg/m      General: Alert, oriented x3, no distress, lean; he appears comfortable most of the time, but occasionally changes position in the chair when he remembers his tailbone pain Head: no evidence of trauma, PERRL, EOMI, no exophtalmos or lid lag, no myxedema, no xanthelasma; normal ears, nose and oropharynx Neck: normal jugular venous pulsations and no hepatojugular reflux;  brisk carotid pulses without delay and no carotid bruits Chest: clear to auscultation, no signs of consolidation by percussion or palpation, normal fremitus, symmetrical and full respiratory excursions Cardiovascular: normal position and quality of the apical impulse, regular rhythm, normal first and second heart sounds, no murmurs, rubs or gallops Abdomen: no tenderness or distention, no masses by palpation, no abnormal pulsatility or arterial bruits, normal bowel sounds, no hepatosplenomegaly Extremities: no clubbing, cyanosis or edema; 2+ radial, ulnar and brachial pulses bilaterally; 2+ right femoral, posterior tibial and dorsalis pedis pulses; 2+ left femoral, posterior tibial and dorsalis pedis pulses; no subclavian or femoral bruits Neurological: grossly nonfocal Psych: Normal mood and affect   Wt Readings from Last 3 Encounters:  09/21/17 150 lb (68 kg)  08/04/17 150 lb (68 kg)  07/15/17 149 lb (67.6 kg)      Studies/Labs Reviewed:   EKG:  EKG is not  ordered today.   Recent Labs: 05/14/2017: B Natriuretic Peptide 850.4; TSH 0.790 06/22/2017: Magnesium 2.4 07/15/2017: ALT 17; Hemoglobin 13.9; Platelets 198 08/04/2017: BUN 25; Creatinine, Ser 1.26; Potassium 4.2; Sodium 141   Lipid Panel    Component Value Date/Time   CHOL 147 10/09/2016 0920   TRIG 234 (H) 10/09/2016 0920   HDL 39 (L) 10/09/2016 0920   CHOLHDL 3.8 10/09/2016 0920   VLDL 47 (H) 10/09/2016 0920   LDLCALC 61 10/09/2016 0920     ASSESSMENT:    1. Coronary artery disease involving native coronary artery of native heart without angina pectoris   2. Systolic CHF with reduced left ventricular function, NYHA class 2 ([place])   3. Orthostatic hypotension   4. Paroxysmal atrial fibrillation (HCC)   5. Encounter for monitoring amiodarone therapy   6. Long term (current) use of anticoagulants   7. Mixed hyperlipidemia      PLAN:  In order of problems listed above:  1. CAD s/p CABG: Asymptomatic. No angina  since surgery. 2. CHF: Mildly depressed left ventricular systolic function, most recent assessment  EF 25-30% in June 2018. Intolerant of ACE inhibitors/angiotensin receptor blockers/beta blockers due to hypotension.  He is pretty sedentary but I estimate that he has functional class II.  I would avoid giving him diuretics unless his weight exceeds 152 pounds. 3. Orthostatic hypotension: Continues to have symptoms and syncope despite Midrin.  His blood pressure still low.  We will increase the morning dose to 15 mg, the noon dose to 15 mg and keep the evening dose of 10 mg daily.  Reluctant to give him any salt lowering agents due to his tendency to develop heart failure.  However, I did suggest a small salty snack every morning and drinking plenty of water.  Avoid excessive coffee and tea due to the diuretic effect.  Tamsulosin may be worsening orthostatic hypotension but appears to be a necessary medication to avoid urinary retention 4. AFib: No clinical recurrence since urgent cardioversion in June.  Tolerating anticoagulation with Eliquis without bleeding problems.  On low-dose amiodarone.  He developed asterixis on higher amiodarone doses.  The arrhythmia occurred on the background of multiple simultaneous medical problems including hematuria, common bile duct stone, pneumonia. CHADSVasc 5 (age 34, CAD, CHF, hx of HTN). 5. Amiodarone: Monitor thyroid (normal TSH in June) and liver tests (normal in August) every 6 months.  Currently without obvious neurological complaints 6. Eliquis: tolerating without bleeding problems. 7. HLP: Last lipid profile was in desirable range with an LDL of 61.  On high-dose statin. 8. Coccydynia: no obvious etiology, usually a self-limited problem.  reviewed conservative methods to mitigate it.  Medication Adjustments/Labs and Tests Ordered: Current medicines are reviewed at length with the patient today.  Concerns regarding medicines are outlined above.  Medication changes,  Labs and Tests ordered today are listed in the Patient Instructions below. Patient Instructions  Dr Sallyanne Kuster has recommended making the following medication changes: 1. Take Furosemide only when weight is over 152 pounds  Your physician recommends that you schedule a follow-up appointment in 6 months with Dr C. You will receive a reminder letter in the mail two months in advance. If you don't receive a letter, please call our office to schedule the follow-up appointment.  If you need a refill on your cardiac medications before your next appointment, please call your pharmacy.   Dr C recommends you eat 1 salty snack every morning.      Signed, Sanda Klein, MD  09/21/2017 7:04 PM    [address], [place], [place]  [address] Phone: [phone]; Fax: [phone]

## 2017-09-21 NOTE — Patient Instructions (Signed)
Dr Sallyanne Kuster has recommended making the following medication changes: 1. Take Furosemide only when weight is over 152 pounds  Your physician recommends that you schedule a follow-up appointment in 6 months with Dr C. You will receive a reminder letter in the mail two months in advance. If you don't receive a letter, please call our office to schedule the follow-up appointment.  If you need a refill on your cardiac medications before your next appointment, please call your pharmacy.   Dr C recommends you eat 1 salty snack every morning.

## 2017-10-13 ENCOUNTER — Other Ambulatory Visit: Payer: Self-pay | Admitting: Gastroenterology

## 2017-10-19 ENCOUNTER — Telehealth: Payer: Self-pay | Admitting: *Deleted

## 2017-10-19 NOTE — Telephone Encounter (Signed)
   [facility] Pre-operative Risk Assessment    Request for surgical clearance:  1. What type of surgery is being performed? ERCP   2. When is this surgery scheduled? 11/02/17   3. Are there any medications that need to be held prior to surgery and how long?ELIQUIS-THEY ARE ASKING FOR DIRECTIONS   4. Practice name and name of physician performing surgery? DR MAGOD   5. What is your office phone and fax number? PH=[phone]  FAX= [phone]   6. Anesthesia type (None, local, MAC, general) ? UNKNOWN   Bruce Green 10/19/2017, 5:28 PM  _________________________________________________________________   (provider comments below)

## 2017-10-20 NOTE — Telephone Encounter (Signed)
This patient is at high risk for surgical procedure with RCRI score of 3, 11% risk of perioperative cardiac event. He has had hypotension and syncope per recent note by Dr. Loletha Grayer on 09/21/17 and midodrine was increased.   He is planned for ERCP on 11/02/17. Based on his advanced age of 54, heart failure and recent hypotension,  I will defer clearance and recommendations for peri procedural therapy to Dr. Debby Bud.

## 2017-10-23 ENCOUNTER — Encounter (HOSPITAL_COMMUNITY): Payer: Self-pay | Admitting: Emergency Medicine

## 2017-10-23 ENCOUNTER — Other Ambulatory Visit: Payer: Self-pay

## 2017-10-23 NOTE — Telephone Encounter (Signed)
Left message for pt to call back re: cardiac clearance.

## 2017-10-23 NOTE — Telephone Encounter (Signed)
    Chart reviewed as part of pre-operative protocol coverage. Chart was reviewed by NP Phylliss Bob on 10/20/17 who had planned to route message below to Dr. Sallyanne Kuster but I do not see this occurred. Will forward note to Dr. Loletha Grayer and also to Gae Bon to make her aware.     Charlie Pitter, PA-C  10/23/2017, 8:11 AM

## 2017-10-23 NOTE — Telephone Encounter (Signed)
Will forward to pharmD for final recs of timing of anticoag interruption, then will need to bundle finalized recommendation to surgeon/patient. Shariyah Eland PA-C

## 2017-10-23 NOTE — Telephone Encounter (Signed)
Agree with Megan's recommendation. MCr

## 2017-10-23 NOTE — Telephone Encounter (Signed)
I believe this patient is at low risk for embolic complications with interruption of his anticoagulation for ERCP. He is at moderate to high risk of hemodynamic complications (especially hypotension) due to the tenuous balance between severe orthostatic hypotension and CHF. Avoid excessive IV fluids and avoid sedatives that have potent vasodilator properties. Mobilize very cautiously after the procedure. Sanda Klein, MD, [facility] [phone] office [phone] pager

## 2017-10-23 NOTE — Telephone Encounter (Signed)
    Chart reviewed as part of pre-operative protocol coverage.   To summarize recommendations from Dr. Sallyanne Kuster: "I believe this patient is at low risk for embolic complications with interruption of his anticoagulation for ERCP. He is at moderate to high risk of hemodynamic complications (especially hypotension) due to the tenuous balance between severe orthostatic hypotension and CHF. Avoid excessive IV fluids and avoid sedatives that have potent vasodilator properties. Mobilize very cautiously after the procedure."   Per pharmD/Dr. Sallyanne Kuster, "Recommend holding Eliquis for 2 days prior to procedure due to decreased renal function." Generally we ask that you resume when felt safe from surgical perspective.  Will route this bundled recommendation to requesting provider via Epic fax function. Please call with questions.  Will also ask callback pool to call patient to inform him of Dr. Victorino December input on his risk of surgery as above.  Charlie Pitter, PA-C  10/23/2017, 1:55 PM

## 2017-10-23 NOTE — Telephone Encounter (Addendum)
Pt takes Eliquis for afib with CHADS2VASc score of 6 (CHF, CAD, age - 4, TIA). SCr 1.26, CrCl 33. Recommend holding Eliquis for 2 days prior to procedure due to decreased renal function, however will confirm with Dr Sallyanne Kuster given hx of TIA.

## 2017-10-26 ENCOUNTER — Other Ambulatory Visit: Payer: Self-pay | Admitting: *Deleted

## 2017-10-26 NOTE — Telephone Encounter (Signed)
Spoke with [name] [facility]) and informed the clearance information was faxed on 10/23/17 to the requesting provider per previous documentation.  POA was already aware of this plan to hold Eliquis x 2 days prior to procedure.

## 2017-10-27 MED ORDER — MIDODRINE HCL 5 MG PO TABS: 5.0000 mg | ORAL_TABLET | Freq: Three times a day (TID) | ORAL | 0 refills | Status: DC

## 2017-10-30 ENCOUNTER — Other Ambulatory Visit: Payer: Self-pay | Admitting: Gastroenterology

## 2017-11-02 ENCOUNTER — Ambulatory Visit (HOSPITAL_COMMUNITY): Admission: RE | Admit: 2017-11-02 | Payer: Medicare Other | Source: Ambulatory Visit | Admitting: Gastroenterology

## 2017-11-02 SURGERY — ERCP, WITH INTERVENTION IF INDICATED
Anesthesia: General

## 2017-11-06 ENCOUNTER — Other Ambulatory Visit: Payer: Self-pay | Admitting: Gastroenterology

## 2017-11-13 ENCOUNTER — Ambulatory Visit (HOSPITAL_COMMUNITY): Payer: Medicare Other | Admitting: Anesthesiology

## 2017-11-13 ENCOUNTER — Ambulatory Visit (HOSPITAL_COMMUNITY): Payer: Medicare Other

## 2017-11-13 ENCOUNTER — Other Ambulatory Visit: Payer: Self-pay

## 2017-11-13 ENCOUNTER — Encounter (HOSPITAL_COMMUNITY)
Admission: RE | Disposition: A | Discharge status: Home / Self Care | Payer: Self-pay | Source: Ambulatory Visit | Attending: Gastroenterology

## 2017-11-13 ENCOUNTER — Encounter (HOSPITAL_COMMUNITY): Payer: Self-pay | Admitting: Anesthesiology

## 2017-11-13 ENCOUNTER — Ambulatory Visit (HOSPITAL_COMMUNITY)
Admission: RE | Admit: 2017-11-13 | Discharge: 2017-11-13 | Disposition: A | Discharge status: Home / Self Care | Payer: Medicare Other | Source: Ambulatory Visit | Attending: Gastroenterology | Admitting: Gastroenterology

## 2017-11-13 DIAGNOSIS — I255 Ischemic cardiomyopathy: Secondary | ICD-10-CM | POA: Diagnosis not present

## 2017-11-13 DIAGNOSIS — Z79899 Other long term (current) drug therapy: Secondary | ICD-10-CM | POA: Diagnosis not present

## 2017-11-13 DIAGNOSIS — K219 Gastro-esophageal reflux disease without esophagitis: Secondary | ICD-10-CM | POA: Insufficient documentation

## 2017-11-13 DIAGNOSIS — Z8249 Family history of ischemic heart disease and other diseases of the circulatory system: Secondary | ICD-10-CM | POA: Diagnosis not present

## 2017-11-13 DIAGNOSIS — Z7901 Long term (current) use of anticoagulants: Secondary | ICD-10-CM | POA: Insufficient documentation

## 2017-11-13 DIAGNOSIS — E559 Vitamin D deficiency, unspecified: Secondary | ICD-10-CM | POA: Diagnosis not present

## 2017-11-13 DIAGNOSIS — K8050 Calculus of bile duct without cholangitis or cholecystitis without obstruction: Secondary | ICD-10-CM | POA: Diagnosis not present

## 2017-11-13 DIAGNOSIS — F419 Anxiety disorder, unspecified: Secondary | ICD-10-CM | POA: Insufficient documentation

## 2017-11-13 DIAGNOSIS — I2510 Atherosclerotic heart disease of native coronary artery without angina pectoris: Secondary | ICD-10-CM | POA: Diagnosis not present

## 2017-11-13 DIAGNOSIS — Z884 Allergy status to anesthetic agent status: Secondary | ICD-10-CM | POA: Diagnosis not present

## 2017-11-13 DIAGNOSIS — Z8673 Personal history of transient ischemic attack (TIA), and cerebral infarction without residual deficits: Secondary | ICD-10-CM | POA: Insufficient documentation

## 2017-11-13 DIAGNOSIS — E785 Hyperlipidemia, unspecified: Secondary | ICD-10-CM | POA: Diagnosis not present

## 2017-11-13 DIAGNOSIS — Z882 Allergy status to sulfonamides status: Secondary | ICD-10-CM | POA: Diagnosis not present

## 2017-11-13 HISTORY — PX: ERCP: SHX5425

## 2017-11-13 SURGERY — ERCP, WITH INTERVENTION IF INDICATED
Anesthesia: General

## 2017-11-13 MED ORDER — ONDANSETRON HCL 4 MG/2ML IJ SOLN
INTRAMUSCULAR | Status: DC | PRN
  Administered 2017-11-13: 4 mg via INTRAVENOUS

## 2017-11-13 MED ORDER — ROCURONIUM BROMIDE 10 MG/ML (PF) SYRINGE
PREFILLED_SYRINGE | INTRAVENOUS | Status: DC | PRN
  Administered 2017-11-13: 40 mg via INTRAVENOUS

## 2017-11-13 MED ORDER — FENTANYL CITRATE (PF) 100 MCG/2ML IJ SOLN
INTRAMUSCULAR | Status: DC | PRN
  Administered 2017-11-13 (×2): 50 ug via INTRAVENOUS

## 2017-11-13 MED ORDER — FENTANYL CITRATE (PF) 100 MCG/2ML IJ SOLN
INTRAMUSCULAR | Status: AC
  Filled 2017-11-13: qty 2

## 2017-11-13 MED ORDER — SODIUM CHLORIDE 0.9 % IV SOLN: INTRAVENOUS | Status: DC

## 2017-11-13 MED ORDER — CIPROFLOXACIN IN D5W 400 MG/200ML IV SOLN
INTRAVENOUS | Status: AC
  Filled 2017-11-13: qty 200

## 2017-11-13 MED ORDER — SODIUM CHLORIDE 0.9 % IV SOLN
INTRAVENOUS | Status: DC
Start: 2017-11-13 — End: 2017-11-13

## 2017-11-13 MED ORDER — PROPOFOL 10 MG/ML IV BOLUS
INTRAVENOUS | Status: DC | PRN
  Administered 2017-11-13: 150 mg via INTRAVENOUS

## 2017-11-13 MED ORDER — CIPROFLOXACIN IN D5W 400 MG/200ML IV SOLN
INTRAVENOUS | Status: DC | PRN
  Administered 2017-11-13: 400 mg via INTRAVENOUS

## 2017-11-13 MED ORDER — INDOMETHACIN 50 MG RE SUPP
RECTAL | Status: AC
  Filled 2017-11-13: qty 2

## 2017-11-13 MED ORDER — LACTATED RINGERS IV SOLN
INTRAVENOUS | Status: DC
  Administered 2017-11-13: 13:00:00 via INTRAVENOUS
  Administered 2017-11-13: 1000 mL via INTRAVENOUS
  Administered 2017-11-13: 14:00:00 via INTRAVENOUS

## 2017-11-13 MED ORDER — SODIUM CHLORIDE 0.9 % IV SOLN
INTRAVENOUS | Status: DC | PRN
  Administered 2017-11-13: 60 mL

## 2017-11-13 MED ORDER — SUGAMMADEX SODIUM 200 MG/2ML IV SOLN
INTRAVENOUS | Status: DC | PRN
  Administered 2017-11-13: 200 mg via INTRAVENOUS

## 2017-11-13 MED ORDER — EPHEDRINE SULFATE-NACL 50-0.9 MG/10ML-% IV SOSY
PREFILLED_SYRINGE | INTRAVENOUS | Status: DC | PRN
  Administered 2017-11-13 (×3): 10 mg via INTRAVENOUS

## 2017-11-13 MED ORDER — LIDOCAINE 2% (20 MG/ML) 5 ML SYRINGE
INTRAMUSCULAR | Status: DC | PRN
  Administered 2017-11-13 (×2): 50 mg via INTRAVENOUS

## 2017-11-13 NOTE — Transfer of Care (Signed)
Immediate Anesthesia Transfer of Care Note  Patient: Bruce Green  Procedure(s) Performed: ENDOSCOPIC RETROGRADE CHOLANGIOPANCREATOGRAPHY (ERCP) (N/A )  Patient Location: Endoscopy Unit  Anesthesia Type:General  Level of Consciousness: awake, alert  and oriented  Airway & Oxygen Therapy: Patient Spontanous Breathing and Patient connected to face mask oxygen  Post-op Assessment: Report given to RN  Post vital signs: Reviewed and stable  Last Vitals:  Vitals:   11/13/17 1214 11/13/17 1232  BP: (!) 201/73 (!) 185/69  Pulse: 68 63  Resp: 16   Temp: 36.5 C   SpO2: 100%     Last Pain:  Vitals:   11/13/17 1214  TempSrc: Oral         Complications: No apparent anesthesia complications

## 2017-11-13 NOTE — Op Note (Signed)
[facility] Patient Name: Bruce Green Procedure Date: 11/13/2017 MRN: [id number] Attending MD: Clarene Essex , MD Date of Birth: 10-04-1919 CSN: [id number] Age: 83 Admit Type: Outpatient Procedure:                ERCP Indications:              Bile duct stone(s) Providers:                Clarene Essex, MD, Autumn Sheppard Plumber RN, RN, Danford Bad, Technician, Arnoldo Hooker, CRNA Referring MD:              Medicines:                General Anesthesia Complications:            No immediate complications. Estimated Blood Loss:     Estimated blood loss: none. Procedure:                Pre-Anesthesia Assessment:                           - Prior to the procedure, a History and Physical                            was performed, and patient medications and                            allergies were reviewed. The patient's tolerance of                            previous anesthesia was also reviewed. The risks                            and benefits of the procedure and the sedation                            options and risks were discussed with the patient.                            All questions were answered, and informed consent                            was obtained. Prior Anticoagulants: The patient has                            taken Eliquis (apixaban), last dose was 2 days                            prior to procedure. ASA Grade Assessment: III - A                            patient with severe systemic disease. After  reviewing the risks and benefits, the patient was                            deemed in satisfactory condition to undergo the                            procedure.                           After obtaining informed consent, the scope was                            passed under direct vision. Throughout the                            procedure, the patient's blood pressure, pulse, and        oxygen saturations were monitored continuously. The                            was introduced through the mouth, and used to                            inject contrast into and used to cannulate the bile                            duct. The ERCP was somewhat difficult due to                            abnormal anatomy both due to multiple duodenal                            diverticulum and position of the scope and size of                            the sphincterotomy and the stones. Successful                            completion of the procedure was aided by performing                            the maneuvers documented (below) in this report.                            The patient tolerated the procedure well. Scope In: Scope Out: Findings:      The major papilla was on the rim of a diverticulum. Deep selective       cannulation was readily obtained and there was no pancreatic duct       injection or wire advancement throughout the procedure and there was       obvious multiple CBD stones on the first cholangiogram and we proceeded       with Biliary sphincterotomy was made with a Hydratome sphincterotome       using ERBE electrocautery. There was no post-sphincterotomy bleeding. We  could get the fully bowed sphincterotome easily in and out of the duct       and there was some adequate biliary drainage. To discover objects, the       biliary tree was swept with an 15-18 mm balloon starting at the upper       third of the main bile duct. Two small stones were removed. We could not       withdraw the 15 mm balloon through the patent sphincterotomy site with       gentle pressure A few stones remained. Lithotripsy with a basket-type       device was successful. We used the 3 cm basket and The biliary tree was       swept with a basket starting at the lower third of the main duct. Two       larger stones were removed. A few stones remained. All stones were       removed after  switching back to the larger balloon and then changing to       the smaller balloon. The biliary tree was swept with a 12 mm balloon       starting at the lower third of the main duct. All stones were removed.       Nothing was found. Multiple balloon pull-through's were done using the       12 mm balloon which did pass readily through the patent sphincterotomy       site and we did try to do a few occlusion cholangiograms but based on       the amount of air in the duct as well as the size of the dilated upper       duct we cannot get great cholangiograms but no stones were seen on       fluoroscopy with injection and pulling through the balloon and we       elected to stop the procedure at this point and the scope wire and       balloon was removed and there was adequate biliary drainage and the       patient tolerated the procedure well Impression:               - The major papilla was on the rim of a                            diverticulum.                           - Choledocholithiasis was found. removal was                            accomplished by biliary sphincterotomy and multiple                            balloon sweeps and basket retrieval as above.                           - A biliary sphincterotomy was performed.                           - The biliary tree was swept.                           -  Lithotripsy and basket retrieval was successful.                           - The biliary tree was swept.                           - The biliary tree was swept multiple times at the                            end of the procedure and nothing was found. Moderate Sedation:      N/A- Per Anesthesia Care Recommendation:           - Clear liquid diet for 6 hours. May have soft                            solids later if doing well if not slowly advance                            tomorrow                           - Resume Eliquis (apixaban) at prior dose tomorrow                             if doing okay. Resume other medicines as scheduled                           - Return to GI clinic in 4 weeks.                           - Telephone GI clinic if symptomatic PRN. Procedure Code(s):        --- Professional ---                           [phone], Endoscopic retrograde                            cholangiopancreatography (ERCP); with destruction                            of calculi, any method (eg, mechanical,                            electrohydraulic, lithotripsy)                           38101, Endoscopic retrograde                            cholangiopancreatography (ERCP); with                            sphincterotomy/papillotomy Diagnosis Code(s):        --- Professional ---  K80.50, Calculus of bile duct without cholangitis                            or cholecystitis without obstruction CPT copyright 2016 American Medical Association. All rights reserved. The codes documented in this report are preliminary and upon coder review may  be revised to meet current compliance requirements. Clarene Essex, MD 11/13/2017 2:20:13 PM This report has been signed electronically. Number of Addenda: 0

## 2017-11-13 NOTE — Anesthesia Preprocedure Evaluation (Signed)
Anesthesia Evaluation  Patient identified by MRN, date of birth, ID band Patient awake    Reviewed: Allergy & Precautions, NPO status , Patient's Chart, lab work & pertinent test results  Airway Mallampati: I       Dental  (+) Lower Dentures, Upper Dentures   Pulmonary    Pulmonary exam normal breath sounds clear to auscultation       Cardiovascular Normal cardiovascular exam Rhythm:Regular Rate:Normal     Neuro/Psych Anxiety    GI/Hepatic   Endo/Other  negative endocrine ROS  Renal/GU   negative genitourinary   Musculoskeletal negative musculoskeletal ROS (+)   Abdominal Normal abdominal exam  (+)   Peds  Hematology negative hematology ROS (+)   Anesthesia Other Findings   Reproductive/Obstetrics                             Anesthesia Physical Anesthesia Plan  ASA: III  Anesthesia Plan: General   Post-op Pain Management:    Induction: Intravenous  PONV Risk Score and Plan: 2 and Treatment may vary due to age or medical condition  Airway Management Planned: Oral ETT  Additional Equipment:   Intra-op Plan:   Post-operative Plan: Extubation in OR  Informed Consent: I have reviewed the patients History and Physical, chart, labs and discussed the procedure including the risks, benefits and alternatives for the proposed anesthesia with the patient or authorized representative who has indicated his/her understanding and acceptance.     Plan Discussed with: Surgeon and CRNA  Anesthesia Plan Comments:         Anesthesia Quick Evaluation

## 2017-11-13 NOTE — Anesthesia Postprocedure Evaluation (Signed)
Anesthesia Post Note  Patient: Bruce Green  Procedure(s) Performed: ENDOSCOPIC RETROGRADE CHOLANGIOPANCREATOGRAPHY (ERCP) (N/A )     Patient location during evaluation: Endoscopy Anesthesia Type: General Level of consciousness: awake and sedated Pain management: pain level controlled Vital Signs Assessment: post-procedure vital signs reviewed and stable Respiratory status: spontaneous breathing Cardiovascular status: stable Postop Assessment: no apparent nausea or vomiting Anesthetic complications: no    Last Vitals:  Vitals:   11/13/17 1450 11/13/17 1500  BP: (!) 183/71 (!) 184/62  Pulse: 63 63  Resp: (!) 23 (!) 22  Temp:    SpO2: 95% 95%    Last Pain:  Vitals:   11/13/17 1424  TempSrc: Oral   Pain Goal:                 Germain Koopmann JR,JOHN Allena Pietila

## 2017-11-13 NOTE — Anesthesia Procedure Notes (Signed)
Procedure Name: Intubation Date/Time: 11/13/2017 1:33 PM Performed by: Lavina Hamman, CRNA Pre-anesthesia Checklist: Patient identified, Emergency Drugs available, Suction available, Patient being monitored and Timeout performed Patient Re-evaluated:Patient Re-evaluated prior to induction Oxygen Delivery Method: Circle system utilized Preoxygenation: Pre-oxygenation with 100% oxygen Induction Type: IV induction Ventilation: Mask ventilation without difficulty Laryngoscope Size: Mac and 4 Grade View: Grade II Tube type: Oral Tube size: 7.5 mm Number of attempts: 1 Airway Equipment and Method: Stylet Placement Confirmation: ETT inserted through vocal cords under direct vision,  positive ETCO2,  CO2 detector and breath sounds checked- equal and bilateral Secured at: 22 cm Tube secured with: Tape Dental Injury: Teeth and Oropharynx as per pre-operative assessment

## 2017-11-13 NOTE — Discharge Instructions (Signed)
YOU HAD AN ENDOSCOPIC PROCEDURE TODAY: Refer to the procedure report and other information in the discharge instructions given to you for any specific questions about what was found during the examination. If this information does not answer your questions, please call [facility] office at [phone] to clarify.   YOU SHOULD EXPECT: Some feelings of bloating in the abdomen. Passage of more gas than usual. Walking can help get rid of the air that was put into your GI tract during the procedure and reduce the bloating. If you had a lower endoscopy (such as a colonoscopy or flexible sigmoidoscopy) you may notice spotting of blood in your stool or on the toilet paper. Some abdominal soreness may be present for a day or two, also.  DIET: Your first meal following the procedure should be a light meal and then it is ok to progress to your normal diet. A half-sandwich or bowl of soup is an example of a good first meal. Heavy or fried foods are harder to digest and may make you feel nauseous or bloated. Drink plenty of fluids but you should avoid alcoholic beverages for 24 hours. If you had a esophageal dilation, please see attached instructions for diet.   ACTIVITY: Your care partner should take you home directly after the procedure. You should plan to take it easy, moving slowly for the rest of the day. You can resume normal activity the day after the procedure however YOU SHOULD NOT DRIVE, use power tools, machinery or perform tasks that involve climbing or major physical exertion for 24 hours (because of the sedation medicines used during the test).   SYMPTOMS TO REPORT IMMEDIATELY: A gastroenterologist can be reached at any hour. Please call [phone]  for any of the following symptoms:  Following lower endoscopy (colonoscopy, flexible sigmoidoscopy) Excessive amounts of blood in the stool  Significant tenderness, worsening of abdominal pains  Swelling of the abdomen that is new, acute  Fever of 100 or  higher  Following upper endoscopy (EGD, EUS, ERCP, esophageal dilation) Vomiting of blood or coffee ground material  New, significant abdominal pain  New, significant chest pain or pain under the shoulder blades  Painful or persistently difficult swallowing  New shortness of breath  Black, tarry-looking or red, bloody stools  FOLLOW UP:  If any biopsies were taken you will be contacted by phone or by letter within the next 1-3 weeks. Call [phone]  if you have not heard about the biopsies in 3 weeks.  Please also call with any specific questions about appointments or follow up tests. Call if question or problem otherwise if doing well tomorrow may resume eliquis and may have clear liquids until 8 PM and if doing well may have soft solids and then may advance diet tomorrow and follow-up or call in one month with an update but call sooner as above if question or problem from a GI standpoint

## 2017-11-13 NOTE — Progress Notes (Signed)
Bruce Green 12:20 PM  Subjective: Patient asymptomatic at home and no problems since we saw him recently in the office and his case discussed again with his daughter as well  Objective: Vital signs stable afebrile exam please see preassessment evaluation  Assessment: CBD stones history of pancreatitis  Plan: We again discussed the procedure and would like for me to proceed with anesthesia assistance  [facility]  Pager [phone] After 5PM or if no answer call [phone]

## 2017-11-15 ENCOUNTER — Encounter (HOSPITAL_COMMUNITY): Payer: Self-pay | Admitting: Gastroenterology

## 2017-11-25 ENCOUNTER — Other Ambulatory Visit: Payer: Self-pay | Admitting: Cardiovascular Disease

## 2017-12-08 ENCOUNTER — Telehealth: Payer: Self-pay | Admitting: *Deleted

## 2017-12-08 NOTE — Telephone Encounter (Signed)
Late entry  POA - daughter [name] came by office , concerned about patient blood pressure readings for several days   Ranging from 11/14/17 -11/24/17-- (74- 114/58-79)   daughter states patient has  episodes that can last from 30 secs to 30 mins-- weak,moaning and  Goes limp.  patient is taking medication has not missed any doses and has not had to use any prn lasix recently.  Daughter aware will speak with Dr Sallyanne Kuster  And contacted.    Reviewed with Dr Sallyanne Kuster the information   per verbal order  1) have patient to take midodrine morning dose at least 30 min before getting out of bed. 2) make sure diose is 15 mg in am , 15 mg at lunch , 10 mg in the evening of midodrine. ( not able to increase anymore per DR C.) 3) EAT a salty snack in the morning. 4)  the other option is to change medication to Northera ( if doesn't) work but an office will be need to discuss the change.  the above information was given to daughter  . [name] states patient is not taking midodrine as above ,but taking medication 5 mg three times a day. This information was given to DR Croitoru .    PER DR CROITORU,  Have patient take 10 mg of midodrine three times a day  And do the other orders stated in the above information.  RN left message on  Daughter cell phone with instruction . Asked to call back on 12/10/17. (if new rx needs to called into pharmacy

## 2017-12-11 MED ORDER — MIDODRINE HCL 5 MG PO TABS: 10.0000 mg | ORAL_TABLET | Freq: Three times a day (TID) | ORAL | 0 refills | Status: DC

## 2017-12-11 NOTE — H&P (Signed)
Bruce LAWRY is an 84 y.o. male.   Chief Complaint: CBD stones HPI: Patient is currently asymptomatic although he did not like his restroom but is doing much better at home in our office computer chart and his hospital computer chart was reviewed as well as his hospitalization over the summer and we discussed CBD stones as well as the risks of the procedure versus the risks of leaving them in and we answered all of their questions and his history was reviewed  Past Medical History:  Diagnosis Date  . Anxiety   . Cataract   . CKD (chronic kidney disease)   . Colon polyp   . Coronary artery disease 04/21/2014     severe triple vessel  . Dyslipidemia   . GERD (gastroesophageal reflux disease)   . Hiatal hernia    6cm  . Ischemic cardiomyopathy 03/2014   EF 25-30%  . Pneumonia   . Stricture esophagus    distal  . TIA (transient ischemic attack)   . Vitamin D deficiency     Past Surgical History:  Procedure Laterality Date  . APPENDECTOMY    . CARDIAC SURGERY    . CARDIOVERSION N/A 05/19/2017   Procedure: CARDIOVERSION;  Surgeon: Larey Dresser, MD;  Location: [facility] ENDOSCOPY;  Service: Cardiovascular;  Laterality: N/A;  . CHOLECYSTECTOMY    . CORONARY ARTERY BYPASS GRAFT N/A 04/24/2014   Procedure: CORONARY ARTERY BYPASS GRAFTING (CABG);  Surgeon: Gaye Pollack, MD;  Location: [facility];  Service: Open Heart Surgery;  Laterality: N/A;  Times 3 using left internal mammary artery and endoscopically harvested right saphenous vein  . ERCP N/A 11/13/2017   Procedure: ENDOSCOPIC RETROGRADE CHOLANGIOPANCREATOGRAPHY (ERCP);  Surgeon: Clarene Essex, MD;  Location: [facility] ENDOSCOPY;  Service: Endoscopy;  Laterality: N/A;  . ESOPHAGOGASTRODUODENOSCOPY  04/09/2012   Procedure: ESOPHAGOGASTRODUODENOSCOPY (EGD);  Surgeon: Gatha Mayer, MD;  Location: [facility] ENDOSCOPY;  Service: Endoscopy;  Laterality: N/A;  . INTRAOPERATIVE TRANSESOPHAGEAL ECHOCARDIOGRAM N/A 04/24/2014   Procedure: INTRAOPERATIVE  TRANSESOPHAGEAL ECHOCARDIOGRAM;  Surgeon: Gaye Pollack, MD;  Location: [facility] OR;  Service: Open Heart Surgery;  Laterality: N/A;  . LEFT AND RIGHT HEART CATHETERIZATION WITH CORONARY ANGIOGRAM N/A 04/21/2014   Procedure: LEFT AND RIGHT HEART CATHETERIZATION WITH CORONARY ANGIOGRAM;  Surgeon: Burnell Blanks, MD;  Location[facility];  Service: Cardiovascular;  Laterality: N/A;  . SHOULDER SURGERY    . TEE WITHOUT CARDIOVERSION N/A 05/19/2017   Procedure: TRANSESOPHAGEAL ECHOCARDIOGRAM (TEE);  Surgeon: Larey Dresser, MD;  Location: [facility] ENDOSCOPY;  Service: Cardiovascular;  Laterality: N/A;    Family History  Problem Relation Age of Onset  . Heart attack Mother   . Kidney disease Father   . Colon cancer Brother   . Dementia Brother   . Aneurysm Brother   . Breast cancer Sister        x 2  . Arthritis Sister   . Osteoporosis Sister    Social History:  reports that  has never smoked. he has never used smokeless tobacco. He reports that he drinks about 0.6 oz of alcohol per week. He reports that he does not use drugs.  Allergies:  Allergies  Allergen Reactions  . Other     general anesthesia - sensitivity,  causes severe lethargy   . Sulfa Antibiotics Rash    No medications prior to admission.    No results found for this or any previous visit (from the past 48 hour(s)). No results found.  ROS entirely negative from a GI  standpoint particularly no pain nausea vomiting yellow jaundice low-grade fever or night sweats or chills and no lower bowel complaints  Blood pressure (!) 184/62, pulse 63, temperature (!) 97.5 F (36.4 C), resp. rate (!) 22, height 5\' 10"  (1.778 m), weight 68 kg (150 lb), SpO2 95 %. Physical Exam see pre-exam evaluation labs and x-rays reviewed  Assessment/Plan Elderly gentleman multiple medical problems with CBD stones and previous hospital stay for cholangitis Plan: The risks benefits methods and options and success rate of ERCP was discussed with  the patient and his daughter and will proceed today with anesthesia assistance with further workup and plans pending his findings  Takari Duncombe E 12/11/2017, 4:23 PM  Pager [phone] After 5 pm or if no answer call [phone]

## 2017-12-11 NOTE — Telephone Encounter (Signed)
Spoke to daughter. Daughter states patient is acting differently ,  Blood pressure is still low  74/50.  Episodes shoulder shaking , labile  drooling at times, similar episodes in the past . Patient did start 10 mg midodrine  Three times a day on Wednesday.  Recommended  Daughter to take patient to  ER, or urgent care or primary for evaluation. Daughter state she would.   daughter states a new prescription is not needed at present time, she will call

## 2017-12-11 NOTE — Addendum Note (Signed)
Addended by: Raiford Simmonds on: 12/11/2017 01:32 PM   Modules accepted: Orders

## 2017-12-11 NOTE — Telephone Encounter (Signed)
Follow up    Bruce Green is returning call in reference to her father. Please call.

## 2017-12-18 ENCOUNTER — Telehealth: Payer: Self-pay | Admitting: Cardiovascular Disease

## 2017-12-18 NOTE — Telephone Encounter (Signed)
Daughter states his BP has been "going downhill" since increasing midodrine. She is very concerned about his diastolic being in the 82X. Provided MD recommendations.   Patient has appt with PA on Monday at PCP office.   Offered to scheduled PAOV with cardiology but she would prefer to wait until after PCP appt on Monday and will call back with update.

## 2017-12-18 NOTE — Telephone Encounter (Signed)
Spoke with [name], daughter of Mr Down who sees Dr. Loletha Grayer. Patient had another syncopal episode this AM per his caregiver. His BP was 90/48 this AM. A recheck later was 116/60. Patient has syncope episodes on occasion, lasting 30sec - 75mins per daughter. He increased midodrine to 10mg  TID as directed by Ivin Booty, RN on 12/08/17 - this is the 1st episode since this med increase. TED hose were recommended but patient has not been wearing consistently. He is eating a salty snack in the AM as suggested. Daughter is concerned b/c his BP is still low - diastolic often in the 63K.   Will route to Dr. Adline Mango CMA to review and advise

## 2017-12-18 NOTE — Telephone Encounter (Signed)
There are not many other options. We should wait a little more before other changes, but we could consider Northerra. It is hard to get insurance to pay for it due to very high cost. MCr

## 2017-12-22 ENCOUNTER — Telehealth: Payer: Self-pay | Admitting: Cardiovascular Disease

## 2017-12-22 MED ORDER — MIDODRINE HCL 10 MG PO TABS: 10.0000 mg | ORAL_TABLET | Freq: Three times a day (TID) | ORAL | 6 refills | Status: DC

## 2017-12-22 NOTE — Telephone Encounter (Signed)
New message  Pt daughter verbalized that she is returning call for RN

## 2017-12-22 NOTE — Telephone Encounter (Signed)
Spoke with pt dtr, the patient was seen by the PCP yesterday and she agreed with the midodrine 10 mg TID. She also mentioned to them decreasing the eliquis to once daily. Explained to the dtr that taking eliquis once daily is not therapeutic and the patient will get no benefitfrom the drug at that dosage. Follow up scheduled next available and midodrine refill sent to the pharmacy. She will call back with issues.

## 2017-12-23 ENCOUNTER — Other Ambulatory Visit: Payer: Self-pay | Admitting: Cardiovascular Disease

## 2017-12-24 NOTE — Telephone Encounter (Signed)
Recent changes in renal function noted. Plan to repeat BMET in 4 days , then refill apixaban

## 2017-12-29 ENCOUNTER — Other Ambulatory Visit: Payer: Self-pay | Admitting: Cardiovascular Disease

## 2018-02-04 ENCOUNTER — Emergency Department (HOSPITAL_COMMUNITY)
Admission: EM | Admit: 2018-02-04 | Discharge: 2018-02-04 | Disposition: A | Discharge status: Home / Self Care | Payer: Medicare Other | Attending: Emergency Medicine | Admitting: Emergency Medicine

## 2018-02-04 ENCOUNTER — Emergency Department (HOSPITAL_COMMUNITY): Payer: Medicare Other

## 2018-02-04 ENCOUNTER — Other Ambulatory Visit: Payer: Self-pay

## 2018-02-04 DIAGNOSIS — W19XXXA Unspecified fall, initial encounter: Secondary | ICD-10-CM | POA: Insufficient documentation

## 2018-02-04 DIAGNOSIS — N189 Chronic kidney disease, unspecified: Secondary | ICD-10-CM | POA: Insufficient documentation

## 2018-02-04 DIAGNOSIS — I2510 Atherosclerotic heart disease of native coronary artery without angina pectoris: Secondary | ICD-10-CM | POA: Diagnosis not present

## 2018-02-04 DIAGNOSIS — R0789 Other chest pain: Secondary | ICD-10-CM

## 2018-02-04 DIAGNOSIS — Y929 Unspecified place or not applicable: Secondary | ICD-10-CM | POA: Diagnosis not present

## 2018-02-04 DIAGNOSIS — Y999 Unspecified external cause status: Secondary | ICD-10-CM | POA: Diagnosis not present

## 2018-02-04 DIAGNOSIS — Z79899 Other long term (current) drug therapy: Secondary | ICD-10-CM | POA: Diagnosis not present

## 2018-02-04 DIAGNOSIS — S0990XA Unspecified injury of head, initial encounter: Secondary | ICD-10-CM | POA: Insufficient documentation

## 2018-02-04 DIAGNOSIS — Y939 Activity, unspecified: Secondary | ICD-10-CM | POA: Insufficient documentation

## 2018-02-04 MED ORDER — TRAMADOL HCL 50 MG PO TABS: 50.0000 mg | ORAL_TABLET | Freq: Three times a day (TID) | ORAL | 0 refills | Status: DC | PRN

## 2018-02-04 NOTE — ED Triage Notes (Signed)
Pt presents for evaluation of fall on Saturday. Seen at PCP today and sent here for further eval. Pt has circular bruise to top of head from fall, on elliquis. Pt also with L ribcage pain and cough.

## 2018-02-05 NOTE — ED Provider Notes (Signed)
[facility] EMERGENCY DEPARTMENT Provider Note   CSN: [id number] Arrival date & time: 02/04/18  1155     History   Chief Complaint Chief Complaint  Patient presents with  . Fall    HPI Bruce Green is a 84 y.o. male.  HPI   84 year old male presenting with family for evaluation after a fall on Saturday.  He did strike the top of his head and is on Eliquis.  Complaining of a mild headache.  No acute neurologic complaints.  He is at his baseline per family and caretaker.  Is also complaining of left lateral chest pain.  Past Medical History:  Diagnosis Date  . Anxiety   . Cataract   . CKD (chronic kidney disease)   . Colon polyp   . Coronary artery disease 04/21/2014     severe triple vessel  . Dyslipidemia   . GERD (gastroesophageal reflux disease)   . Hiatal hernia    6cm  . Ischemic cardiomyopathy 03/2014   EF 25-30%  . Pneumonia   . Stricture esophagus    distal  . TIA (transient ischemic attack)   . Vitamin D deficiency     Patient Active Problem List   Diagnosis Date Noted  . Syncope and collapse 07/14/2017  . Urinary tract infection 07/14/2017  . Mitral regurgitation 07/13/2017  . Syncope 07/13/2017  . Acute lower UTI 07/13/2017  . AAA (abdominal aortic aneurysm) without rupture ([place]) 05/14/2017  . CAP (community acquired pneumonia) 05/13/2017  . Gross hematuria 05/13/2017  . Elevated LFTs 05/13/2017  . Hyperbilirubinemia 05/13/2017  . Pain in joint of left shoulder 05/13/2017  . S/P CABG (coronary artery bypass graft) 06/08/2014  . Orthostatic hypotension 06/08/2014  . SOB (shortness of breath) 05/17/2014  . Eye infection 05/17/2014  . CAD (coronary artery disease) 04/28/2014  . Cardiomyopathy, ischemic 04/25/2014  . S/P CABG x 3 04/24/2014  . Cardiomyopathy of undetermined type ([place]) 04/19/2014  . Systolic CHF with reduced left ventricular function, NYHA class 2 ([place]) 04/19/2014  . Dyspnea on exertion 04/10/2014  . Murmur  04/10/2014  . Cardiomegaly 04/10/2014  . Coronary artery calcification seen on CAT scan 04/10/2014  . GERD with stricture 04/09/2012    Past Surgical History:  Procedure Laterality Date  . APPENDECTOMY    . CARDIAC SURGERY    . CARDIOVERSION N/A 05/19/2017   Procedure: CARDIOVERSION;  Surgeon: Larey Dresser, MD;  Location[facility] ENDOSCOPY;  Service: Cardiovascular;  Laterality: N/A;  . CHOLECYSTECTOMY    . CORONARY ARTERY BYPASS GRAFT N/A 04/24/2014   Procedure: CORONARY ARTERY BYPASS GRAFTING (CABG);  Surgeon: Gaye Pollack, MD;  Location: [facility];  Service: Open Heart Surgery;  Laterality: N/A;  Times 3 using left internal mammary artery and endoscopically harvested right saphenous vein  . ERCP N/A 11/13/2017   Procedure: ENDOSCOPIC RETROGRADE CHOLANGIOPANCREATOGRAPHY (ERCP);  Surgeon: Clarene Essex, MD;  Location: [facility] ENDOSCOPY;  Service: Endoscopy;  Laterality: N/A;  . ESOPHAGOGASTRODUODENOSCOPY  04/09/2012   Procedure: ESOPHAGOGASTRODUODENOSCOPY (EGD);  Surgeon: Gatha Mayer, MD;  Location: [facility] ENDOSCOPY;  Service: Endoscopy;  Laterality: N/A;  . INTRAOPERATIVE TRANSESOPHAGEAL ECHOCARDIOGRAM N/A 04/24/2014   Procedure: INTRAOPERATIVE TRANSESOPHAGEAL ECHOCARDIOGRAM;  Surgeon: Gaye Pollack, MD;  Location: [facility] OR;  Service: Open Heart Surgery;  Laterality: N/A;  . LEFT AND RIGHT HEART CATHETERIZATION WITH CORONARY ANGIOGRAM N/A 04/21/2014   Procedure: LEFT AND RIGHT HEART CATHETERIZATION WITH CORONARY ANGIOGRAM;  Surgeon: Burnell Blanks, MD;  Location: [facility] CATH LAB;  Service: Cardiovascular;  Laterality:  N/A;  . SHOULDER SURGERY    . TEE WITHOUT CARDIOVERSION N/A 05/19/2017   Procedure: TRANSESOPHAGEAL ECHOCARDIOGRAM (TEE);  Surgeon: Larey Dresser, MD;  Location: [facility] ENDOSCOPY;  Service: Cardiovascular;  Laterality: N/A;       Home Medications    Prior to Admission medications   Medication Sig Start Date End Date Taking? Authorizing Provider  acetaminophen (TYLENOL) 500 MG tablet  Take 500-1,000 mg by mouth 2 (two) times daily as needed for moderate pain.     [provider]  amiodarone (PACERONE) 200 MG tablet Take 1 tablet (200 mg total) by mouth daily. 09/21/17   Croitoru, Mihai, MD  atorvastatin (LIPITOR) 80 MG tablet Take 40 mg by mouth daily.    [provider]  B Complex-C (B-COMPLEX WITH VITAMIN C) tablet Take 1 tablet by mouth daily.    [provider]  Cholecalciferol (VITAMIN D3) 5000 units TABS Take 5,000 Units by mouth every other day.     [provider]  ELIQUIS 2.5 MG TABS tablet TAKE ONE TABLET BY MOUTH TWICE A DAY 12/28/17   Croitoru, Mihai, MD  ELIQUIS 2.5 MG TABS tablet TAKE ONE TABLET BY MOUTH TWICE A DAY 12/30/17   Croitoru, Mihai, MD  fluticasone (FLONASE) 50 MCG/ACT nasal spray Place 1 spray into both nostrils daily as needed for allergies or rhinitis.    [provider]  folic acid (FOLVITE) 568 MCG tablet Take 400 mcg by mouth daily.    [provider]  furosemide (LASIX) 40 MG tablet Take 0.5 tablets (20 mg total) by mouth daily as needed. FOR WT GAIN >3# IN 1 DAY  -OR- >5# IN A WEEK 08/04/17   Barrett, Evelene Croon, PA-C  Lidocaine 4 % PTCH Apply 0.5 patches topically daily as needed (pain).    [provider]  loratadine (CLARITIN) 10 MG tablet Take 10 mg by mouth daily.     [provider]  Magnesium 400 MG CAPS Take 400 mg by mouth every other day. 06/25/17   Croitoru, Mihai, MD  Melatonin 5 MG TABS Take 5-10 mg by mouth as needed (sleep).    [provider]  midodrine (PROAMATINE) 10 MG tablet Take 1 tablet (10 mg total) by mouth 3 (three) times daily with meals. 12/22/17   Croitoru, Mihai, MD  pantoprazole (PROTONIX) 40 MG tablet TAKE 1 TABLET (40 MG TOTAL) BY MOUTH DAILY BEFORE BREAKFAST. 08/28/17   Croitoru, Mihai, MD  polyethylene glycol (MIRALAX / GLYCOLAX) packet Take 17 g by mouth daily as needed for mild constipation. 05/21/17   Bonnielee Haff, MD  potassium chloride SA  (K-DUR,KLOR-CON) 20 MEQ tablet Take 1 tablet (20 mEq total) by mouth daily. TAKE WHEN TAKING YOUR LASIX Patient taking differently: Take 20 mEq by mouth daily as needed (weight gain). TAKE WHEN TAKING YOUR LASIX 06/22/17   Croitoru, Mihai, MD  Probiotic Product (PROBIOTIC PO) Take 1 tablet by mouth daily.    [provider]  tamsulosin (FLOMAX) 0.4 MG CAPS capsule Take 1 capsule (0.4 mg total) by mouth daily after supper. 05/21/17   Bonnielee Haff, MD  traMADol (ULTRAM) 50 MG tablet Take 1 tablet (50 mg total) by mouth every 8 (eight) hours as needed. 02/04/18   Virgel Manifold, MD    Family History Family History  Problem Relation Age of Onset  . Heart attack Mother   . Kidney disease Father   . Colon cancer Brother   . Dementia Brother   . Aneurysm Brother   . Breast  cancer Sister        x 2  . Arthritis Sister   . Osteoporosis Sister     Social History Social History   Tobacco Use  . Smoking status: Never Smoker  . Smokeless tobacco: Never Used  Substance Use Topics  . Alcohol use: Yes    Alcohol/week: 0.6 oz    Types: 1 Standard drinks or equivalent per week    Comment: rare  . Drug use: No     Allergies   Other and Sulfa antibiotics   Review of Systems Review of Systems  All systems reviewed and negative, other than as noted in HPI.  Physical Exam Updated Vital Signs BP (!) 161/87   Pulse (!) 50   Temp 97.8 F (36.6 C) (Oral)   Resp 16   Ht 5\' 10"  (1.778 m)   Wt 69.4 kg (153 lb)   SpO2 92%   BMI 21.95 kg/m   Physical Exam  Constitutional: He is oriented to person, place, and time. He appears well-developed and well-nourished. No distress.  HENT:  Head: Normocephalic and atraumatic.  Eyes: Conjunctivae are normal. Right eye exhibits no discharge. Left eye exhibits no discharge.  Neck: Neck supple.  Cardiovascular: Normal rate, regular rhythm and normal heart sounds. Exam reveals no gallop and no friction rub.  No murmur  heard. Pulmonary/Chest: Effort normal and breath sounds normal. No respiratory distress.  Abdominal: Soft. He exhibits no distension. There is no tenderness.  Musculoskeletal: He exhibits no edema or tenderness.  Mild tenderness to palpation along the left chest wall.  No overlying skin changes.  Breath sounds are clear bilaterally with symmetric breath sounds.  Neurological: He is alert and oriented to person, place, and time. No cranial nerve deficit. He exhibits normal muscle tone. Coordination normal.  Skin: Skin is warm and dry.  Psychiatric: He has a normal mood and affect. His behavior is normal. Thought content normal.  Nursing note and vitals reviewed.    ED Treatments / Results  Labs (all labs ordered are listed, but only abnormal results are displayed) Labs Reviewed - No data to display  EKG  EKG Interpretation None       Radiology Dg Ribs Unilateral W/chest Left  Result Date: 02/04/2018 CLINICAL DATA:  Left lower anterior rib pain after fall 2 days ago. EXAM: LEFT RIBS AND CHEST - 3+ VIEW COMPARISON:  Chest x-ray dated August 25, 2017. FINDINGS: No evidence of rib fracture. Stable mild cardiomegaly. Normal pulmonary vascularity. Unchanged coarsened interstitial markings, more prominent at the lung bases. No focal consolidation, pleural effusion, or pneumothorax. IMPRESSION: 1. No rib fracture. 2. Stable mild cardiomegaly and chronic bronchitic changes. No active cardiopulmonary disease. Electronically Signed   By: Titus Dubin M.D.   On: 02/04/2018 13:04   Ct Head Wo Contrast  Result Date: 02/04/2018 CLINICAL DATA:  Head injury after fall. EXAM: CT HEAD WITHOUT CONTRAST TECHNIQUE: Contiguous axial images were obtained from the base of the skull through the vertex without intravenous contrast. COMPARISON:  CT scan of July 05, 2015. FINDINGS: Brain: Mild diffuse cortical atrophy is noted. No mass effect or midline shift is noted. Ventricular size is within normal limits.  There is no evidence of mass lesion, hemorrhage or acute infarction. Vascular: No hyperdense vessel or unexpected calcification. Skull: Normal. Negative for fracture or focal lesion. Sinuses/Orbits: No acute finding. Other: None. IMPRESSION: Mild diffuse cortical atrophy. No acute intracranial abnormality seen. Electronically Signed   By: Marijo Conception, M.D.   On:  02/04/2018 13:18    Procedures Procedures (including critical care time)  Medications Ordered in ED Medications - No data to display   Initial Impression / Assessment and Plan / ED Course  I have reviewed the triage vital signs and the nursing notes.  Pertinent labs & imaging results that were available during my care of the patient were reviewed by me and considered in my medical decision making (see chart for details).     84 year old male presenting after fall.  Nonfocal neurologic examination.  CT of the head without acute abnormality.  Left chest wall pain.  Will prescribe some tramadol as needed in addition to the Tylenol he is Artie been taking.  To the spirometry.  Chest x-ray without obvious rib fracture or other concerning pathology.  Final Clinical Impressions(s) / ED Diagnoses   Final diagnoses:  Fall, initial encounter  Closed head injury, initial encounter  Chest wall pain    ED Discharge Orders        Ordered    traMADol (ULTRAM) 50 MG tablet  Every 8 hours PRN     02/04/18 1407       Virgel Manifold, MD 02/05/18 1034

## 2018-02-12 ENCOUNTER — Encounter: Payer: Self-pay | Admitting: Cardiovascular Disease

## 2018-02-12 ENCOUNTER — Ambulatory Visit: Payer: Medicare Other | Admitting: Cardiovascular Disease

## 2018-02-12 VITALS — BP 116/62 | HR 66 | Ht 70.0 in | Wt 156.4 lb

## 2018-02-12 DIAGNOSIS — I5020 Unspecified systolic (congestive) heart failure: Secondary | ICD-10-CM

## 2018-02-12 DIAGNOSIS — Z7901 Long term (current) use of anticoagulants: Secondary | ICD-10-CM

## 2018-02-12 DIAGNOSIS — Z5181 Encounter for therapeutic drug level monitoring: Secondary | ICD-10-CM | POA: Diagnosis not present

## 2018-02-12 DIAGNOSIS — Z79899 Other long term (current) drug therapy: Secondary | ICD-10-CM

## 2018-02-12 DIAGNOSIS — I2510 Atherosclerotic heart disease of native coronary artery without angina pectoris: Secondary | ICD-10-CM | POA: Diagnosis not present

## 2018-02-12 DIAGNOSIS — E7800 Pure hypercholesterolemia, unspecified: Secondary | ICD-10-CM

## 2018-02-12 DIAGNOSIS — I480 Paroxysmal atrial fibrillation: Secondary | ICD-10-CM

## 2018-02-12 DIAGNOSIS — G909 Disorder of the autonomic nervous system, unspecified: Secondary | ICD-10-CM | POA: Diagnosis not present

## 2018-02-12 LAB — BASIC METABOLIC PANEL
BUN / CREAT RATIO: 21 (ref 10–24)
BUN: 31 mg/dL (ref 10–36)
CO2: 21 mmol/L (ref 20–29)
CREATININE: 1.46 mg/dL — AB (ref 0.76–1.27)
Calcium: 9.1 mg/dL (ref 8.6–10.2)
Chloride: 108 mmol/L — ABNORMAL HIGH (ref 96–106)
GFR calc non Af Amer: 40 mL/min/{1.73_m2} — ABNORMAL LOW (ref >59)
GFR, EST AFRICAN AMERICAN: 46 mL/min/{1.73_m2} — AB (ref >59)
Glucose: 101 mg/dL — ABNORMAL HIGH (ref 65–99)
Potassium: 4.9 mmol/L (ref 3.5–5.2)
Sodium: 145 mmol/L — ABNORMAL HIGH (ref 134–144)

## 2018-02-12 MED ORDER — ABDOMINAL BINDER/ELASTIC MED MISC: 0 refills | Status: DC

## 2018-02-12 NOTE — Progress Notes (Signed)
ROS  Patient ID: Bruce Green, male   DOB: 12-13-1918, 84 y.o.   MRN: [id number] Patient ID: Bruce Green, male   DOB: 1919/05/08, 84 y.o.   MRN: [id number] Patient ID: Bruce Green, male   DOB: 1919-09-04, 84 y.o.   MRN: [id number]    Cardiology Office Note    Date:  02/12/2018   ID:  Bruce Green, DOB [birth year], MRN [id number]  PCP:  Kathyrn Lass, MD  Cardiologist:   Sanda Klein, MD   No chief complaint on file.   History of Present Illness:  Bruce Green is a 84 y.o. male with coronary artery disease roughly 3 years after multivessel bypass surgery which led to marked improvement in left ventricular systolic function (EF 72-53 %, increased to about 45% post CABG, reduced to 25% during AFib and acute illness in June 2018), severe orthostatic hypotension, hyperlipidemia.    Both of his daughters are here with him today.  They have reached a decision that build needs 24-hour supervision and he has a caregiver who is here with him as well.  [name] has not had problems with dyspnea, angina, edema or new focal neurological events, but continues to have issues with orthostatic hypotension and syncope.  He has had 2 more serious events since his last appointment, both of them occurring in a pattern suggestive of orthostatic hypotension (going up to the bathroom at night, getting up out of bed).  On 1 of these occasions he hit his head and had a large subcutaneous hematoma.  He was seen in the [facility] emergency department on March 14 and workup did not show any evidence of intracranial hemorrhage or other serious injury.  He did not have rib fracture on chest x-ray.  The syncopal events are occurring despite the fact that he is on Midrin 10 mg 3 times daily.  He has not received Lasix in several weeks.  He has had a relatively stable weight between 146 and 155 pounds on his home scale.  There is no clear correlation between his weight and the episodes of syncope.  In fact his most  recent fall happened when his weight was 153 pounds.  Significant orthostatic hypotension is again confirmed today, even though he has taken high-dose Midodrine.  His blood pressure decreased from 116/62 while sitting to 92/50 standing.  He did not feel dizzy.  His heart rate did not change at all.  He underwent TEE guided cardioversion for atrial fibrillation in June 2018.  His TEE showed evidence of grade 4 aortic atherosclerosis and severe mitral regurgitation (the transthoracic study showed only moderate mitral regurgitation).  Small PFO was incidentally recorded    Past Medical History:  Diagnosis Date  . Anxiety   . Cataract   . CKD (chronic kidney disease)   . Colon polyp   . Coronary artery disease 04/21/2014     severe triple vessel  . Dyslipidemia   . GERD (gastroesophageal reflux disease)   . Hiatal hernia    6cm  . Ischemic cardiomyopathy 03/2014   EF 25-30%  . Pneumonia   . Stricture esophagus    distal  . TIA (transient ischemic attack)   . Vitamin D deficiency     Past Surgical History:  Procedure Laterality Date  . APPENDECTOMY    . CARDIAC SURGERY    . CARDIOVERSION N/A 05/19/2017   Procedure: CARDIOVERSION;  Surgeon: Larey Dresser, MD;  Location: [facility] ENDOSCOPY;  Service: Cardiovascular;  Laterality: N/A;  .  CHOLECYSTECTOMY    . CORONARY ARTERY BYPASS GRAFT N/A 04/24/2014   Procedure: CORONARY ARTERY BYPASS GRAFTING (CABG);  Surgeon: Gaye Pollack, MD;  Location: [place];  Service: Open Heart Surgery;  Laterality: N/A;  Times 3 using left internal mammary artery and endoscopically harvested right saphenous vein  . ERCP N/A 11/13/2017   Procedure: ENDOSCOPIC RETROGRADE CHOLANGIOPANCREATOGRAPHY (ERCP);  Surgeon: Clarene Essex, MD;  Location: [facility] ENDOSCOPY;  Service: Endoscopy;  Laterality: N/A;  . ESOPHAGOGASTRODUODENOSCOPY  04/09/2012   Procedure: ESOPHAGOGASTRODUODENOSCOPY (EGD);  Surgeon: Gatha Mayer, MD;  Location: [facility] ENDOSCOPY;  Service: Endoscopy;  Laterality:  N/A;  . INTRAOPERATIVE TRANSESOPHAGEAL ECHOCARDIOGRAM N/A 04/24/2014   Procedure: INTRAOPERATIVE TRANSESOPHAGEAL ECHOCARDIOGRAM;  Surgeon: Gaye Pollack, MD;  Location: [place] OR;  Service: Open Heart Surgery;  Laterality: N/A;  . LEFT AND RIGHT HEART CATHETERIZATION WITH CORONARY ANGIOGRAM N/A 04/21/2014   Procedure: LEFT AND RIGHT HEART CATHETERIZATION WITH CORONARY ANGIOGRAM;  Surgeon: Burnell Blanks, MD;  Location: [facility] CATH LAB;  Service: Cardiovascular;  Laterality: N/A;  . SHOULDER SURGERY    . TEE WITHOUT CARDIOVERSION N/A 05/19/2017   Procedure: TRANSESOPHAGEAL ECHOCARDIOGRAM (TEE);  Surgeon: Larey Dresser, MD;  Location: [facility] ENDOSCOPY;  Service: Cardiovascular;  Laterality: N/A;    Outpatient Medications Prior to Visit  Medication Sig Dispense Refill  . acetaminophen (TYLENOL) 500 MG tablet Take 500-1,000 mg by mouth 2 (two) times daily as needed for moderate pain.     Marland Kitchen amiodarone (PACERONE) 200 MG tablet Take 1 tablet (200 mg total) by mouth daily. 90 tablet 3  . atorvastatin (LIPITOR) 80 MG tablet Take 40 mg by mouth daily.    . B Complex-C (B-COMPLEX WITH VITAMIN C) tablet Take 1 tablet by mouth daily.    . Cholecalciferol (VITAMIN D3) 5000 units TABS Take 5,000 Units by mouth every other day.     Marland Kitchen ELIQUIS 2.5 MG TABS tablet TAKE ONE TABLET BY MOUTH TWICE A DAY 180 tablet 0  . fluticasone (FLONASE) 50 MCG/ACT nasal spray Place 1 spray into both nostrils daily as needed for allergies or rhinitis.    . folic acid (FOLVITE) 202 MCG tablet Take 400 mcg by mouth daily.    . furosemide (LASIX) 40 MG tablet Take 0.5 tablets (20 mg total) by mouth daily as needed. FOR WT GAIN >3# IN 1 DAY  -OR- >5# IN A WEEK 30 tablet 2  . Lidocaine 4 % PTCH Apply 0.5 patches topically daily as needed (pain).    Marland Kitchen loratadine (CLARITIN) 10 MG tablet Take 10 mg by mouth daily.     . Magnesium 400 MG CAPS Take 400 mg by mouth every other day.    . Melatonin 5 MG TABS Take 5-10 mg by mouth as needed  (sleep).    . midodrine (PROAMATINE) 10 MG tablet Take 1 tablet (10 mg total) by mouth 3 (three) times daily with meals. 90 tablet 6  . pantoprazole (PROTONIX) 40 MG tablet TAKE 1 TABLET (40 MG TOTAL) BY MOUTH DAILY BEFORE BREAKFAST. 90 tablet 3  . polyethylene glycol (MIRALAX / GLYCOLAX) packet Take 17 g by mouth daily as needed for mild constipation. 14 each 0  . potassium chloride SA (K-DUR,KLOR-CON) 20 MEQ tablet Take 1 tablet (20 mEq total) by mouth daily. TAKE WHEN TAKING YOUR LASIX (Patient taking differently: Take 20 mEq by mouth daily as needed (weight gain). TAKE WHEN TAKING YOUR LASIX) 30 tablet 5  . Probiotic Product (PROBIOTIC PO) Take 1 tablet by mouth daily.    Marland Kitchen  tamsulosin (FLOMAX) 0.4 MG CAPS capsule Take 1 capsule (0.4 mg total) by mouth daily after supper. 30 capsule   . ELIQUIS 2.5 MG TABS tablet TAKE ONE TABLET BY MOUTH TWICE A DAY 180 tablet 0  . traMADol (ULTRAM) 50 MG tablet Take 1 tablet (50 mg total) by mouth every 8 (eight) hours as needed. 12 tablet 0   No facility-administered medications prior to visit.      Allergies:   Other; Septra [sulfamethoxazole-trimethoprim]; Tramadol; and Sulfa antibiotics   Social History   Socioeconomic History  . Marital status: Widowed    Spouse name: Not on file  . Number of children: 2  . Years of education: Not on file  . Highest education level: Not on file  Occupational History  . Occupation: retired  Scientific laboratory technician  . Financial resource strain: Not on file  . Food insecurity:    Worry: Not on file    Inability: Not on file  . Transportation needs:    Medical: Not on file    Non-medical: Not on file  Tobacco Use  . Smoking status: Never Smoker  . Smokeless tobacco: Never Used  Substance and Sexual Activity  . Alcohol use: Yes    Alcohol/week: 0.6 oz    Types: 1 Standard drinks or equivalent per week    Comment: rare  . Drug use: No  . Sexual activity: Not on file  Lifestyle  . Physical activity:    Days per  week: Not on file    Minutes per session: Not on file  . Stress: Not on file  Relationships  . Social connections:    Talks on phone: Not on file    Gets together: Not on file    Attends religious service: Not on file    Active member of club or organization: Not on file    Attends meetings of clubs or organizations: Not on file    Relationship status: Not on file  Other Topics Concern  . Not on file  Social History Narrative   Retired Conservation officer, historic buildings. Widowed. Was in [place] and [place]. He has 2 daughters and several grandchildren.         Family History:  The patient's family history includes Aneurysm in his brother; Arthritis in his sister; Breast cancer in his sister; Colon cancer in his brother; Dementia in his brother; Heart attack in his mother; Kidney disease in his father; Osteoporosis in his sister.   ROS:   Please see the history of present illness.    ROS All other systems reviewed and are negative.   PHYSICAL EXAM:   VS:  BP 116/62   Pulse 66   Ht 5\' 10"  (1.778 m)   Wt 156 lb 6.4 oz (70.9 kg)   BMI 22.44 kg/m    Standing blood pressure 92/50 mmHg  General: Alert, oriented x3, no distress, lean, elderly and somewhat frail appearing Head: no evidence of trauma, PERRL, EOMI, no exophtalmos or lid lag, no myxedema, no xanthelasma; normal ears, nose and oropharynx Neck: normal jugular venous pulsations and no hepatojugular reflux; brisk carotid pulses without delay and no carotid bruits Chest: clear to auscultation, no signs of consolidation by percussion or palpation, normal fremitus, symmetrical and full respiratory excursions Cardiovascular: normal position and quality of the apical impulse, regular rhythm, normal first and second heart sounds, no murmurs, rubs or gallops Abdomen: no tenderness or distention, no masses by palpation, no abnormal pulsatility or arterial bruits, normal bowel sounds, no hepatosplenomegaly Extremities: no clubbing,  cyanosis or edema; 2+ radial,  ulnar and brachial pulses bilaterally; 2+ right femoral, posterior tibial and dorsalis pedis pulses; 2+ left femoral, posterior tibial and dorsalis pedis pulses; no subclavian or femoral bruits Neurological: grossly nonfocal Psych: Normal mood and affect    Wt Readings from Last 3 Encounters:  02/12/18 156 lb 6.4 oz (70.9 kg)  02/04/18 153 lb (69.4 kg)  11/13/17 150 lb (68 kg)      Studies/Labs Reviewed:   EKG:  EKG is ordered today.  It shows normal sinus rhythm with nonspecific intraventricular conduction delays, Q waves in the inferior leads, slightly scooped out ST segments in lead V6. Recent Labs: 05/14/2017: B Natriuretic Peptide 850.4; TSH 0.790 06/22/2017: Magnesium 2.4 07/15/2017: ALT 17; Hemoglobin 13.9; Platelets 198 08/04/2017: BUN 25; Creatinine, Ser 1.26; Potassium 4.2; Sodium 141   Lipid Panel    Component Value Date/Time   CHOL 147 10/09/2016 0920   TRIG 234 (H) 10/09/2016 0920   HDL 39 (L) 10/09/2016 0920   CHOLHDL 3.8 10/09/2016 0920   VLDL 47 (H) 10/09/2016 0920   LDLCALC 61 10/09/2016 0920     ASSESSMENT:    1. Coronary artery disease involving native coronary artery of native heart without angina pectoris   2. Systolic CHF with reduced left ventricular function, NYHA class 2 ([place])   3. Autonomic dysfunction   4. Paroxysmal atrial fibrillation (HCC)   5. Long term (current) use of anticoagulants   6. Encounter for monitoring amiodarone therapy   7. Hypercholesterolemia   8. Medication management      PLAN:  In order of problems listed above:  1. CAD s/p CABG: [name] has not had any angina since his bypass procedure.  He is asymptomatic. 2. CHF:  most recent assessment EF 25-30% in June 2018. Intolerant of ACE inhibitors/angiotensin receptor blockers/beta blockers due to hypotension.  He cannot be treated with fludrocortisone for his syncope due to the risk of heart failure.  Thankfully has not required diuretics in many weeks.  We have established a  "dry weight" of roughly 152 pounds. 3. Autonomic dysfunction: Despite maximum dose midodrine he continues to have syncopal events, falls, injuries and greater than 20 mmHg drop in blood pressure with changes in position.  Will try to get him Northerra.  Gave some samples today to see if they help.  If they do we will start the laborious process of getting approval for this expensive agent.  I really do not have any other options to offer.  Did give him an abdominal binder prescription.  His legs are so thin that compression stockings do very little.  Tamsulosin may be worsening orthostatic hypotension but appears to be a necessary medication to avoid urinary retention 4. AFib: No clinical recurrence since urgent cardioversion in June 2018.  Tolerating anticoagulation with Eliquis without bleeding problems, but I am very worried about his recurrent falls and the risk of intracranial hemorrhage.  On low-dose amiodarone.  He developed asterixis on higher amiodarone doses.  The arrhythmia occurred on the background of multiple simultaneous medical problems including hematuria, common bile duct stone, pneumonia. CHADSVasc 5 (age 59, CAD, CHF, hx of HTN).   5. Eliquis: If he continues to fall may have to stop the anticoagulant. 6. Amiodarone: Monitor thyroid (normal TSH in June 2018) and liver tests (normal in August 2018) every 6 months.  Currently without obvious neurological complaints 7. HLP: Last lipid profile was in desirable range with an LDL of 61.  Continue high-dose statin.   Medication  Adjustments/Labs and Tests Ordered: Current medicines are reviewed at length with the patient today.  Concerns regarding medicines are outlined above.  Medication changes, Labs and Tests ordered today are listed in the Patient Instructions below. Patient Instructions  Dr Sallyanne Kuster has recommended making the following medication changes: 1. START Northera - may take up to 3 times daily - when you get up in the morning,  midday, and in late afternoon DAY 1 and 2 - 100 mg DAY 3 and 4 - 200 mg DAY 5 and 6 - 300 mg  DAY 7 and 8 - 400 mg DAY 9 and 10 - 500 mg DAY 11-30 - 600 mg 2. TAKE MORNING DOSE OF MIDODRINE BEFORE YOU GET OUT OF BED!  Your physician recommends that you return for lab work TODAY.  Dr Sallyanne Kuster recommends that you schedule a follow-up appointment in MAY 2019.  If you need a refill on your cardiac medications before your next appointment, please call your pharmacy.      Signed, Sanda Klein, MD  02/12/2018 3:29 PM    [address], [place], [place]  [address] Phone: [phone]; Fax: [phone]

## 2018-02-12 NOTE — Patient Instructions (Addendum)
Dr Sallyanne Kuster has recommended making the following medication changes: 1. START Northera - may take up to 3 times daily - when you get up in the morning, midday, and in late afternoon DAY 1 and 2 - 100 mg DAY 3 and 4 - 200 mg DAY 5 and 6 - 300 mg  DAY 7 and 8 - 400 mg DAY 9 and 10 - 500 mg DAY 11-30 - 600 mg 2. TAKE MORNING DOSE OF MIDODRINE BEFORE YOU GET OUT OF BED!  Your physician recommends that you return for lab work TODAY.  Dr Sallyanne Kuster recommends that you schedule a follow-up appointment in MAY 2019.  If you need a refill on your cardiac medications before your next appointment, please call your pharmacy.

## 2018-02-15 ENCOUNTER — Telehealth: Payer: Self-pay | Admitting: Cardiovascular Disease

## 2018-02-15 NOTE — Telephone Encounter (Signed)
Pt c/o medication issue:  1. Name of Medication: Northera   2. How are you currently taking this medication (dosage and times per day)?   3. Are you having a reaction (difficulty breathing--STAT)?no  4. What is your medication issue? Patient had diarrhea and is vomiting. Patient daughter [name]) states that patient is irrational and talking "funny." [name] states that this has been happening since 4 am.

## 2018-02-15 NOTE — Telephone Encounter (Signed)
Returned the call to the patient's daughter. She stated that the patient recently started Northera on Saturday. As of yesterday the patient has been having severe diarrhea, vomiting and delirium.   The patient's blood pressure on Sunday was 107/63, today 110/66 with heart rate 71. While on the phone, blood pressure was 136/65. Per Dr. Sallyanne Kuster the patient should hold the Northera until symptoms subside. The daughter will call the office back with updates.

## 2018-02-25 NOTE — Telephone Encounter (Signed)
Daughter returned call.  Patient is feeling better.  Daughter will consider re-challenging in about 10 days, as she will be on vacation next week and doesn't want him to try if she is not available.   Assured her this is fine.  Let us know in a few weeks.

## 2018-02-25 NOTE — Telephone Encounter (Signed)
LMOM for daughter to see how patient is doing.  Did he re-challenge?

## 2018-03-04 ENCOUNTER — Other Ambulatory Visit: Payer: Self-pay

## 2018-03-04 ENCOUNTER — Inpatient Hospital Stay (HOSPITAL_COMMUNITY)
Admission: EM | Admit: 2018-03-04 | Discharge: 2018-03-08 | DRG: 178 | Disposition: A | Discharge status: Home Health | Payer: Medicare Other | Attending: Internal Medicine | Admitting: Internal Medicine

## 2018-03-04 ENCOUNTER — Inpatient Hospital Stay (HOSPITAL_COMMUNITY): Payer: Medicare Other

## 2018-03-04 ENCOUNTER — Telehealth: Payer: Self-pay | Admitting: Cardiovascular Disease

## 2018-03-04 ENCOUNTER — Emergency Department (HOSPITAL_COMMUNITY): Payer: Medicare Other

## 2018-03-04 ENCOUNTER — Encounter (HOSPITAL_COMMUNITY): Payer: Self-pay | Admitting: Emergency Medicine

## 2018-03-04 DIAGNOSIS — S2232XA Fracture of one rib, left side, initial encounter for closed fracture: Secondary | ICD-10-CM | POA: Diagnosis present

## 2018-03-04 DIAGNOSIS — E559 Vitamin D deficiency, unspecified: Secondary | ICD-10-CM | POA: Diagnosis present

## 2018-03-04 DIAGNOSIS — K222 Esophageal obstruction: Secondary | ICD-10-CM | POA: Diagnosis present

## 2018-03-04 DIAGNOSIS — I2699 Other pulmonary embolism without acute cor pulmonale: Secondary | ICD-10-CM | POA: Diagnosis not present

## 2018-03-04 DIAGNOSIS — N189 Chronic kidney disease, unspecified: Secondary | ICD-10-CM | POA: Diagnosis present

## 2018-03-04 DIAGNOSIS — Z8673 Personal history of transient ischemic attack (TIA), and cerebral infarction without residual deficits: Secondary | ICD-10-CM | POA: Diagnosis not present

## 2018-03-04 DIAGNOSIS — R1310 Dysphagia, unspecified: Secondary | ICD-10-CM | POA: Diagnosis not present

## 2018-03-04 DIAGNOSIS — Z884 Allergy status to anesthetic agent status: Secondary | ICD-10-CM

## 2018-03-04 DIAGNOSIS — I129 Hypertensive chronic kidney disease with stage 1 through stage 4 chronic kidney disease, or unspecified chronic kidney disease: Secondary | ICD-10-CM | POA: Diagnosis present

## 2018-03-04 DIAGNOSIS — R0902 Hypoxemia: Secondary | ICD-10-CM | POA: Diagnosis present

## 2018-03-04 DIAGNOSIS — K224 Dyskinesia of esophagus: Secondary | ICD-10-CM | POA: Diagnosis present

## 2018-03-04 DIAGNOSIS — Z79899 Other long term (current) drug therapy: Secondary | ICD-10-CM

## 2018-03-04 DIAGNOSIS — Z882 Allergy status to sulfonamides status: Secondary | ICD-10-CM | POA: Diagnosis not present

## 2018-03-04 DIAGNOSIS — I4891 Unspecified atrial fibrillation: Secondary | ICD-10-CM | POA: Diagnosis present

## 2018-03-04 DIAGNOSIS — E041 Nontoxic single thyroid nodule: Secondary | ICD-10-CM | POA: Diagnosis present

## 2018-03-04 DIAGNOSIS — Z885 Allergy status to narcotic agent status: Secondary | ICD-10-CM

## 2018-03-04 DIAGNOSIS — I2510 Atherosclerotic heart disease of native coronary artery without angina pectoris: Secondary | ICD-10-CM | POA: Diagnosis present

## 2018-03-04 DIAGNOSIS — I340 Nonrheumatic mitral (valve) insufficiency: Secondary | ICD-10-CM | POA: Diagnosis present

## 2018-03-04 DIAGNOSIS — E785 Hyperlipidemia, unspecified: Secondary | ICD-10-CM

## 2018-03-04 DIAGNOSIS — Z951 Presence of aortocoronary bypass graft: Secondary | ICD-10-CM

## 2018-03-04 DIAGNOSIS — J189 Pneumonia, unspecified organism: Secondary | ICD-10-CM | POA: Diagnosis not present

## 2018-03-04 DIAGNOSIS — I951 Orthostatic hypotension: Secondary | ICD-10-CM | POA: Diagnosis present

## 2018-03-04 DIAGNOSIS — J690 Pneumonitis due to inhalation of food and vomit: Principal | ICD-10-CM | POA: Diagnosis present

## 2018-03-04 DIAGNOSIS — R9389 Abnormal findings on diagnostic imaging of other specified body structures: Secondary | ICD-10-CM | POA: Diagnosis present

## 2018-03-04 DIAGNOSIS — R946 Abnormal results of thyroid function studies: Secondary | ICD-10-CM | POA: Diagnosis present

## 2018-03-04 DIAGNOSIS — X58XXXA Exposure to other specified factors, initial encounter: Secondary | ICD-10-CM | POA: Diagnosis present

## 2018-03-04 DIAGNOSIS — K529 Noninfective gastroenteritis and colitis, unspecified: Secondary | ICD-10-CM | POA: Diagnosis present

## 2018-03-04 DIAGNOSIS — K219 Gastro-esophageal reflux disease without esophagitis: Secondary | ICD-10-CM | POA: Diagnosis present

## 2018-03-04 DIAGNOSIS — I255 Ischemic cardiomyopathy: Secondary | ICD-10-CM | POA: Diagnosis present

## 2018-03-04 DIAGNOSIS — E042 Nontoxic multinodular goiter: Secondary | ICD-10-CM | POA: Diagnosis present

## 2018-03-04 DIAGNOSIS — J181 Lobar pneumonia, unspecified organism: Secondary | ICD-10-CM | POA: Diagnosis not present

## 2018-03-04 HISTORY — DX: Essential (primary) hypertension: I10

## 2018-03-04 LAB — CBC
HCT: 45.8 % (ref 39.0–52.0)
Hemoglobin: 14.5 g/dL (ref 13.0–17.0)
MCH: 29 pg (ref 26.0–34.0)
MCHC: 31.7 g/dL (ref 30.0–36.0)
MCV: 91.6 fL (ref 78.0–100.0)
PLATELETS: 151 10*3/uL (ref 150–400)
RBC: 5 MIL/uL (ref 4.22–5.81)
RDW: 15.7 % — AB (ref 11.5–15.5)
WBC: 6.4 10*3/uL (ref 4.0–10.5)

## 2018-03-04 LAB — BASIC METABOLIC PANEL
Anion gap: 11 (ref 5–15)
BUN: 23 mg/dL — AB (ref 6–20)
CHLORIDE: 109 mmol/L (ref 101–111)
CO2: 22 mmol/L (ref 22–32)
CREATININE: 1.36 mg/dL — AB (ref 0.61–1.24)
Calcium: 8.3 mg/dL — ABNORMAL LOW (ref 8.9–10.3)
GFR calc non Af Amer: 42 mL/min — ABNORMAL LOW (ref >60)
GFR, EST AFRICAN AMERICAN: 49 mL/min — AB (ref >60)
Glucose, Bld: 112 mg/dL — ABNORMAL HIGH (ref 65–99)
Potassium: 3.6 mmol/L (ref 3.5–5.1)
Sodium: 142 mmol/L (ref 135–145)

## 2018-03-04 LAB — I-STAT TROPONIN, ED: TROPONIN I, POC: 0.02 ng/mL (ref 0.00–0.08)

## 2018-03-04 LAB — I-STAT CG4 LACTIC ACID, ED: Lactic Acid, Venous: 2.11 mmol/L (ref 0.5–1.9)

## 2018-03-04 MED ORDER — ATORVASTATIN CALCIUM 40 MG PO TABS
40.0000 mg | ORAL_TABLET | Freq: Every day | ORAL | Status: DC
  Administered 2018-03-05 – 2018-03-08 (×4): 40 mg via ORAL
  Filled 2018-03-04 (×4): qty 1

## 2018-03-04 MED ORDER — CEFTRIAXONE SODIUM 1 G IJ SOLR
1.0000 g | Freq: Once | INTRAMUSCULAR | Status: DC
  Filled 2018-03-04: qty 10

## 2018-03-04 MED ORDER — ALBUTEROL SULFATE (2.5 MG/3ML) 0.083% IN NEBU: 5.0000 mg | INHALATION_SOLUTION | Freq: Once | RESPIRATORY_TRACT | Status: DC

## 2018-03-04 MED ORDER — ONDANSETRON HCL 4 MG PO TABS: 4.0000 mg | ORAL_TABLET | Freq: Four times a day (QID) | ORAL | Status: DC | PRN

## 2018-03-04 MED ORDER — IPRATROPIUM BROMIDE 0.02 % IN SOLN
0.5000 mg | Freq: Once | RESPIRATORY_TRACT | Status: AC
  Administered 2018-03-04: 0.5 mg via RESPIRATORY_TRACT
  Filled 2018-03-04: qty 2.5

## 2018-03-04 MED ORDER — FOLIC ACID 1 MG PO TABS: 500.0000 ug | ORAL_TABLET | Freq: Every day | ORAL | Status: DC

## 2018-03-04 MED ORDER — METHYLPREDNISOLONE SODIUM SUCC 125 MG IJ SOLR
60.0000 mg | Freq: Three times a day (TID) | INTRAMUSCULAR | Status: DC
  Administered 2018-03-05 – 2018-03-06 (×5): 60 mg via INTRAVENOUS
  Filled 2018-03-04 (×5): qty 2

## 2018-03-04 MED ORDER — IOPAMIDOL (ISOVUE-370) INJECTION 76%
80.0000 mL | Freq: Once | INTRAVENOUS | Status: AC | PRN
  Administered 2018-03-04: 80 mL via INTRAVENOUS

## 2018-03-04 MED ORDER — PANTOPRAZOLE SODIUM 40 MG PO TBEC
40.0000 mg | DELAYED_RELEASE_TABLET | Freq: Every day | ORAL | Status: DC
  Administered 2018-03-05 – 2018-03-06 (×2): 40 mg via ORAL
  Filled 2018-03-04 (×2): qty 1

## 2018-03-04 MED ORDER — VANCOMYCIN HCL 10 G IV SOLR
2000.0000 mg | Freq: Once | INTRAVENOUS | Status: AC
  Administered 2018-03-05: 2000 mg via INTRAVENOUS
  Filled 2018-03-04: qty 2000

## 2018-03-04 MED ORDER — ONDANSETRON HCL 4 MG/2ML IJ SOLN: 4.0000 mg | Freq: Four times a day (QID) | INTRAMUSCULAR | Status: DC | PRN

## 2018-03-04 MED ORDER — MIDODRINE HCL 5 MG PO TABS
10.0000 mg | ORAL_TABLET | Freq: Three times a day (TID) | ORAL | Status: DC
  Administered 2018-03-05 – 2018-03-08 (×10): 10 mg via ORAL
  Filled 2018-03-04 (×11): qty 2

## 2018-03-04 MED ORDER — AMIODARONE HCL 200 MG PO TABS
200.0000 mg | ORAL_TABLET | Freq: Every day | ORAL | Status: DC
  Administered 2018-03-05 – 2018-03-08 (×4): 200 mg via ORAL
  Filled 2018-03-04 (×4): qty 1

## 2018-03-04 MED ORDER — ALBUTEROL SULFATE (2.5 MG/3ML) 0.083% IN NEBU
5.0000 mg | INHALATION_SOLUTION | Freq: Once | RESPIRATORY_TRACT | Status: AC
  Administered 2018-03-04: 5 mg via RESPIRATORY_TRACT
  Filled 2018-03-04: qty 6

## 2018-03-04 MED ORDER — IPRATROPIUM-ALBUTEROL 0.5-2.5 (3) MG/3ML IN SOLN: 3.0000 mL | RESPIRATORY_TRACT | Status: DC | PRN

## 2018-03-04 MED ORDER — METOPROLOL TARTRATE 5 MG/5ML IV SOLN: 5.0000 mg | Freq: Four times a day (QID) | INTRAVENOUS | Status: DC | PRN

## 2018-03-04 MED ORDER — MELATONIN 3 MG PO TABS
6.0000 mg | ORAL_TABLET | Freq: Every evening | ORAL | Status: DC | PRN
  Filled 2018-03-04 (×2): qty 3

## 2018-03-04 MED ORDER — VANCOMYCIN HCL IN DEXTROSE 1-5 GM/200ML-% IV SOLN
1000.0000 mg | INTRAVENOUS | Status: DC
  Administered 2018-03-05 – 2018-03-06 (×2): 1000 mg via INTRAVENOUS
  Filled 2018-03-04 (×3): qty 200

## 2018-03-04 MED ORDER — IOPAMIDOL (ISOVUE-370) INJECTION 76%
INTRAVENOUS | Status: AC
  Filled 2018-03-04: qty 100

## 2018-03-04 MED ORDER — FOLIC ACID 1 MG PO TABS
1.0000 mg | ORAL_TABLET | Freq: Every day | ORAL | Status: DC
  Administered 2018-03-05 – 2018-03-08 (×4): 1 mg via ORAL
  Filled 2018-03-04 (×4): qty 1

## 2018-03-04 MED ORDER — LORATADINE 10 MG PO TABS
10.0000 mg | ORAL_TABLET | Freq: Every day | ORAL | Status: DC
  Administered 2018-03-05 – 2018-03-08 (×4): 10 mg via ORAL
  Filled 2018-03-04 (×4): qty 1

## 2018-03-04 MED ORDER — TAMSULOSIN HCL 0.4 MG PO CAPS
0.4000 mg | ORAL_CAPSULE | Freq: Every day | ORAL | Status: DC
  Administered 2018-03-05 – 2018-03-08 (×4): 0.4 mg via ORAL
  Filled 2018-03-04 (×4): qty 1

## 2018-03-04 MED ORDER — APIXABAN 5 MG PO TABS
5.0000 mg | ORAL_TABLET | Freq: Two times a day (BID) | ORAL | Status: DC
  Administered 2018-03-05 – 2018-03-08 (×8): 5 mg via ORAL
  Filled 2018-03-04 (×10): qty 1

## 2018-03-04 MED ORDER — POLYETHYLENE GLYCOL 3350 17 G PO PACK: 17.0000 g | PACK | Freq: Every day | ORAL | Status: DC | PRN

## 2018-03-04 MED ORDER — SODIUM CHLORIDE 0.9 % IV SOLN
500.0000 mg | Freq: Once | INTRAVENOUS | Status: DC
  Filled 2018-03-04: qty 500

## 2018-03-04 MED ORDER — IPRATROPIUM-ALBUTEROL 0.5-2.5 (3) MG/3ML IN SOLN
3.0000 mL | Freq: Four times a day (QID) | RESPIRATORY_TRACT | Status: DC
  Administered 2018-03-05 (×3): 3 mL via RESPIRATORY_TRACT
  Filled 2018-03-04 (×4): qty 3

## 2018-03-04 MED ORDER — CEFEPIME HCL 2 G IJ SOLR
2.0000 g | INTRAMUSCULAR | Status: DC
  Administered 2018-03-04 – 2018-03-06 (×3): 2 g via INTRAVENOUS
  Filled 2018-03-04 (×3): qty 2

## 2018-03-04 NOTE — Telephone Encounter (Signed)
New message   Pt c/o Shortness Of Breath: STAT if SOB developed within the last 24 hours or pt is noticeably SOB on the phone  1. Are you currently SOB (can you hear that pt is SOB on the phone)? YES per daughter  2. How long have you been experiencing SOB? 2 days  3. Are you SOB when sitting or when up moving around?  moving around  4. Are you currently experiencing any other symptoms? Patient will get SOB and then turn blue , coming and going

## 2018-03-04 NOTE — ED Notes (Signed)
Patient transported to CT 

## 2018-03-04 NOTE — ED Notes (Signed)
Patient transported to Ultrasound 

## 2018-03-04 NOTE — ED Notes (Signed)
ED Provider at bedside. 

## 2018-03-04 NOTE — Progress Notes (Signed)
Pharmacy Antibiotic Note  Bruce Green is a 84 y.o. male admitted on 03/04/2018 with pneumonia.  Pharmacy has been consulted for vancomycin and cefepime dosing. ED course: afebrile, WBC 6.4, LA pending.  Plan: Vancomycin 2,000 mg IV loading dose now. Vancomycin 1,000 mg IV every 24 hours.  Goal trough 15-20 mcg/mL.  Cefepime 2 g IV every 24 hours. F/U cultures, LOT, renal function, and vanc trough as needed.  Height: 5\' 10"  (177.8 cm) Weight: 156 lb (70.8 kg) IBW/kg (Calculated) : 73  Temp (24hrs), Avg:97.9 F (36.6 C), Min:97.9 F (36.6 C), Max:97.9 F (36.6 C)  Recent Labs  Lab 03/04/18 1200  WBC 6.4  CREATININE 1.36*    Estimated Creatinine Clearance: 31.8 mL/min (A) (by C-G formula based on SCr of 1.36 mg/dL (H)).    Allergies  Allergen Reactions  . Other Other (See Comments)    General anesthesia - sensitivity,  causes severe lethargy   . Septra [Sulfamethoxazole-Trimethoprim] Hives  . Tramadol Other (See Comments)    Caused low b/p   . Sulfa Antibiotics Rash    Antimicrobials this admission: Cefepime 4/11 >> Vancomycin 4/11 >>  Dose adjustments this admission: none  Microbiology results: 4/11 BCx: sent  Thank you for allowing pharmacy to be a part of this patient's care.  Angus Seller, PharmD Pharmacy Resident Clinical Phone for 03/04/2018 until 3:30pm: [phone] If after 3:30pm, please call main pharmacy at [phone] 03/04/2018 9:07 PM

## 2018-03-04 NOTE — ED Triage Notes (Signed)
Patient complains of increasing shortness of breath and now altered mental status. Patient lives at home with 24 hour caregivers. Caregiver states patient started complaining of shortness of breath on Monday. Yesterday was unable to find a position that allowed him to breath easily, caregiver states she noticed cyanosis around mouth when he was laying down. Patient denies shortness of breath while sitting in wheelchair but states if he tries to walk or even just standing makes him short of breath.

## 2018-03-04 NOTE — H&P (Addendum)
PCP:   Kathyrn Lass, MD   Chief Complaint: SOB, wet cough   HPI: this is a 84 y/o male who has not been feeling well since 2-3 weeks. Dizzy, unbalanced.  Normally moves well. Also has SOB. He has a wet sounding cough. He reports a wheeze. He reports chills and chills.  He has been having some diarrhea there last few weeks. hes had some nausea and vomiting. He has a good appetite. He's been to see his PCP and diagnosed with a virus, eventually a Zpac was prescribed, the patient didn't improve significantly with the Z Pack. He's not able to lay flat. He was brought to the EE. History provided by the patients daughter and caregiver. [name] [phone] daughter POA.   Review of Systems:  The patient denies anorexia, fever, chills, dizzy, cough, weight loss,, vision loss, decreased hearing, hoarseness, wheeze, chest pain, syncope, dyspnea on exertion, peripheral edema, balance deficits, hemoptysis, abdominal pain, melena, hematochezia, severe indigestion/heartburn, hematuria, incontinence, genital sores, muscle weakness, suspicious skin lesions, transient blindness, difficulty walking, depression, unusual weight change, abnormal bleeding, enlarged lymph nodes, angioedema, and breast masses.  Past Medical History: Past Medical History:  Diagnosis Date  . Anxiety   . Cataract   . CKD (chronic kidney disease)   . Colon polyp   . Coronary artery disease 04/21/2014     severe triple vessel  . Dyslipidemia   . GERD (gastroesophageal reflux disease)   . Hiatal hernia    6cm  . Hypertension   . Ischemic cardiomyopathy 03/2014   EF 25-30%  . Pneumonia   . Stricture esophagus    distal  . TIA (transient ischemic attack)   . Vitamin D deficiency    Past Surgical History:  Procedure Laterality Date  . APPENDECTOMY    . CARDIAC SURGERY    . CARDIOVERSION N/A 05/19/2017   Procedure: CARDIOVERSION;  Surgeon: Larey Dresser, MD;  Location: [facility] ENDOSCOPY;  Service: Cardiovascular;  Laterality:  N/A;  . CHOLECYSTECTOMY    . CORONARY ARTERY BYPASS GRAFT N/A 04/24/2014   Procedure: CORONARY ARTERY BYPASS GRAFTING (CABG);  Surgeon: Gaye Pollack, MD;  Location: [place];  Service: Open Heart Surgery;  Laterality: N/A;  Times 3 using left internal mammary artery and endoscopically harvested right saphenous vein  . ERCP N/A 11/13/2017   Procedure: ENDOSCOPIC RETROGRADE CHOLANGIOPANCREATOGRAPHY (ERCP);  Surgeon: Clarene Essex, MD;  Location: [facility] ENDOSCOPY;  Service: Endoscopy;  Laterality: N/A;  . ESOPHAGOGASTRODUODENOSCOPY  04/09/2012   Procedure: ESOPHAGOGASTRODUODENOSCOPY (EGD);  Surgeon: Gatha Mayer, MD;  Location: [facility] ENDOSCOPY;  Service: Endoscopy;  Laterality: N/A;  . INTRAOPERATIVE TRANSESOPHAGEAL ECHOCARDIOGRAM N/A 04/24/2014   Procedure: INTRAOPERATIVE TRANSESOPHAGEAL ECHOCARDIOGRAM;  Surgeon: Gaye Pollack, MD;  Location: [place] [facility];  Service: Open Heart Surgery;  Laterality: N/A;  . LEFT AND RIGHT HEART CATHETERIZATION WITH CORONARY ANGIOGRAM N/A 04/21/2014   Procedure: LEFT AND RIGHT HEART CATHETERIZATION WITH CORONARY ANGIOGRAM;  Surgeon: Burnell Blanks, MD;  Location: [facility] CATH LAB;  Service: Cardiovascular;  Laterality: N/A;  . SHOULDER SURGERY    . TEE WITHOUT CARDIOVERSION N/A 05/19/2017   Procedure: TRANSESOPHAGEAL ECHOCARDIOGRAM (TEE);  Surgeon: Larey Dresser, MD;  Location: [facility] ENDOSCOPY;  Service: Cardiovascular;  Laterality: N/A;    Medications: Prior to Admission medications   Medication Sig Start Date End Date Taking? Authorizing Provider  acetaminophen (TYLENOL) 500 MG tablet Take 500-1,000 mg by mouth 2 (two) times daily as needed for moderate pain.    Yes [provider]  amiodarone (PACERONE) 200  MG tablet Take 1 tablet (200 mg total) by mouth daily. 09/21/17  Yes Croitoru, Mihai, MD  atorvastatin (LIPITOR) 80 MG tablet Take 40 mg by mouth daily.   Yes [provider]  azithromycin (ZITHROMAX) 250 MG tablet Take 250-500 mg by mouth See admin  instructions. Take 500 mg by mouth on day 1, then take 250 mg once a day on days 2-5 03/03/18 03/07/18 Yes [provider]  B Complex-C (B-COMPLEX WITH VITAMIN C) tablet Take 1 tablet by mouth daily.   Yes [provider]  Cholecalciferol (VITAMIN D3) 5000 units TABS Take 5,000 Units by mouth every Monday, Wednesday, and Friday.    Yes [provider]  ELIQUIS 2.5 MG TABS tablet TAKE ONE TABLET BY MOUTH TWICE A DAY Patient taking differently: Take 2.5 mg by mouth two times a day 12/30/17  Yes Croitoru, Mihai, MD  fluticasone (FLONASE) 50 MCG/ACT nasal spray Place 1 spray into both nostrils daily as needed for allergies or rhinitis.   Yes [provider]  folic acid (FOLVITE) 161 MCG tablet Take 400 mcg by mouth daily.   Yes [provider]  furosemide (LASIX) 40 MG tablet Take 0.5 tablets (20 mg total) by mouth daily as needed. FOR WT GAIN >3# IN 1 DAY  -OR- >5# IN A WEEK Patient taking differently: Take 20 mg by mouth daily as needed (for a weight gain of 3 pounds or more in a day or 5 pounds or more in a week).  08/04/17  Yes Barrett, Evelene Croon, PA-C  loratadine (CLARITIN) 10 MG tablet Take 10 mg by mouth daily.    Yes [provider]  Magnesium 400 MG CAPS Take 400 mg by mouth every other day. Patient taking differently: Take 400 mg by mouth every Monday, Wednesday, and Friday.  06/25/17  Yes Croitoru, Mihai, MD  Melatonin 5 MG TABS Take 5-10 mg by mouth at bedtime as needed (for sleep).    Yes [provider]  midodrine (PROAMATINE) 10 MG tablet Take 1 tablet (10 mg total) by mouth 3 (three) times daily with meals. 12/22/17  Yes Croitoru, Mihai, MD  pantoprazole (PROTONIX) 40 MG tablet TAKE 1 TABLET (40 MG TOTAL) BY MOUTH DAILY BEFORE BREAKFAST. Patient taking differently: Take 40 mg by mouth daily with breakfast.  08/28/17  Yes Croitoru, Mihai, MD  polyethylene glycol (MIRALAX / GLYCOLAX) packet Take 17 g by mouth daily as needed for mild  constipation. 05/21/17  Yes Bonnielee Haff, MD  potassium chloride SA (K-DUR,KLOR-CON) 20 MEQ tablet Take 1 tablet (20 mEq total) by mouth daily. TAKE WHEN TAKING YOUR LASIX Patient taking differently: Take 20 mEq by mouth daily as needed (only when taking Lasix for 3-5 lb. weight gain).  06/22/17  Yes Croitoru, Mihai, MD  PROAIR HFA 108 [phone] Base) MCG/ACT inhaler Inhale 2 puffs into the lungs every 4-6 hours as needed for shortness of breath or wheezing 03/02/18  Yes [provider]  Probiotic Product (PROBIOTIC PO) Take 1 capsule by mouth daily. 30 BILLION strength   Yes [provider]  tamsulosin (FLOMAX) 0.4 MG CAPS capsule Take 1 capsule (0.4 mg total) by mouth daily after supper. 05/21/17  Yes Bonnielee Haff, MD  Elastic Bandages & Supports (ABDOMINAL BINDER/ELASTIC MED) MISC Wear daily 02/12/18   Croitoru, Dani Gobble, MD    Allergies:   Allergies  Allergen Reactions  . Other Other (See Comments)    General anesthesia - sensitivity,  causes severe lethargy   . Septra [Sulfamethoxazole-Trimethoprim] Hives  .  Tramadol Other (See Comments)    Caused low b/p   . Sulfa Antibiotics Rash    Social History:  reports that he has never smoked. He has never used smokeless tobacco. He reports that he drinks about 0.6 oz of alcohol per week. He reports that he does not use drugs.  Family History: Family History  Problem Relation Age of Onset  . Heart attack Mother   . Kidney disease Father   . Colon cancer Brother   . Dementia Brother   . Aneurysm Brother   . Breast cancer Sister        x 2  . Arthritis Sister   . Osteoporosis Sister     Physical Exam: Vitals:   03/04/18 1715 03/04/18 1730 03/04/18 1800 03/04/18 2053  BP: (!) 161/76 (!) 151/90 (!) 139/49   Pulse: (!) 58 (!) 58 64   Resp: 18 18 (!) 22   Temp:      TempSrc:      SpO2: 95% 99% 94%   Weight:    70.8 kg (156 lb)  Height:    5\' 10"  (1.778 m)    General:  Alert and oriented times three, well developed  and nourished, no acute distress Eyes: PERRLA, pink conjunctiva, no scleral icterus ENT: Moist oral mucosa, neck supple, no thyromegaly Lungs: course, wheeze, crackles, no use of accessory muscles Cardiovascular: regular rate and rhythm, no regurgitation, no gallops, no murmurs. No carotid bruits, no JVD Abdomen: soft, positive BS, non-tender, non-distended, no organomegaly, not an acute abdomen GU: not examined Neuro: CN II - XII grossly intact, sensation intact Musculoskeletal: strength 5/5 all extremities, no clubbing, cyanosis or edema Skin: no rash, no subcutaneous crepitation, no decubitus Psych: appropriate patient   Labs on Admission:  Recent Labs    03/04/18 1200  NA 142  K 3.6  CL 109  CO2 22  GLUCOSE 112*  BUN 23*  CREATININE 1.36*  CALCIUM 8.3*   No results for input(s): AST, ALT, ALKPHOS, BILITOT, PROT, ALBUMIN in the last 72 hours. No results for input(s): LIPASE, AMYLASE in the last 72 hours. Recent Labs    03/04/18 1200  WBC 6.4  HGB 14.5  HCT 45.8  MCV 91.6  PLT 151   No results for input(s): CKTOTAL, CKMB, CKMBINDEX, TROPONINI in the last 72 hours. Invalid input(s): POCBNP No results for input(s): DDIMER in the last 72 hours. No results for input(s): HGBA1C in the last 72 hours. No results for input(s): CHOL, HDL, LDLCALC, TRIG, CHOLHDL, LDLDIRECT in the last 72 hours. No results for input(s): TSH, T4TOTAL, T3FREE, THYROIDAB in the last 72 hours.  Invalid input(s): FREET3 No results for input(s): VITAMINB12, FOLATE, FERRITIN, TIBC, IRON, RETICCTPCT in the last 72 hours.  Micro Results: No results found for this or any previous visit (from the past 240 hour(s)).   Radiological Exams on Admission: Dg Chest 2 View  Result Date: 03/04/2018 CLINICAL DATA:  Short of breath. EXAM: CHEST - 2 VIEW COMPARISON:  03/02/2018 FINDINGS: There stable changes from prior CABG surgery. Cardiac silhouette is mildly enlarged. No mediastinal or hilar masses.  No  evidence of adenopathy. Lungs are mildly hyperexpanded. There coarse reticular opacities in the bases, left greater than right, consistent with fibrosis. No evidence of pneumonia or pulmonary edema. Small nodule suggested in the right upper lobe on the prior exam is not appreciated on the current exam. No pleural effusion or pneumothorax. Skeletal structures are demineralized but intact. IMPRESSION: 1. No acute cardiopulmonary disease. 2. Mild  stable cardiomegaly and stable changes from prior CABG surgery. 3. Mild lung hyperexpansion. Left greater than right lung base fibrosis. Electronically Signed   By: Lajean Manes M.D.   On: 03/04/2018 13:04   Ct Angio Chest Pe W And/or Wo Contrast  Addendum Date: 03/04/2018   ADDENDUM REPORT: 03/04/2018 19:14 ADDENDUM: Critical Value/emergent results were called by telephone at the time of interpretation on 03/04/2018 at 7:10 pm to Dr. Jola Schmidt , who verbally acknowledged these results. Electronically Signed   By: Elon Alas M.D.   On: 03/04/2018 19:14   Result Date: 03/04/2018 CLINICAL DATA:  Dyspnea for a few months. History of pneumonia, cardiomyopathy. EXAM: CT ANGIOGRAPHY CHEST WITH CONTRAST TECHNIQUE: Multidetector CT imaging of the chest was performed using the standard protocol during bolus administration of intravenous contrast. Multiplanar CT image reconstructions and MIPs were obtained to evaluate the vascular anatomy. CONTRAST:  63mL ISOVUE-370 IOPAMIDOL (ISOVUE-370) INJECTION 76% COMPARISON:  Chest radiograph March 04, 2018 and CT chest May 21, 2014 FINDINGS: CARDIOVASCULAR: Adequate contrast opacification of the pulmonary artery's. Main pulmonary artery is not enlarged. Mild respiratory motion with faint central hypodensity LEFT lower lobe segmental branch (axial 234/425). The heart is moderately enlarged. Status post median sternotomy for CABG. Severe coronary artery calcifications. Thoracic aorta is 3.6 cm in transaxial dimension, stable from  prior imaging. MEDIASTINUM/NODES: No lymphadenopathy by CT size criteria. LUNGS/PLEURA: Tracheobronchial tree is patent, no pneumothorax. Diffuse moderate bronchial wall thickening. Patchy bilateral lower consolidation. 3 mm calcified granuloma RIGHT upper lobe. Punctate calcifications LEFT lower lobe. Small RIGHT pleural effusion. UPPER ABDOMEN: Small to moderate hiatal hernia. Status post cholecystectomy. 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 MUSCULOSKELETAL: Severe glenohumeral osteoarthrosis. Healing new LEFT posterior seventh rib fracture. Review of the MIP images confirms the above findings. IMPRESSION: 1. Nonocclusive LEFT lower lobe segmental artery pulmonary embolus versus artifact. 2. Bronchial wall thickening increased from prior examination most compatible with bronchitis. Patchy bibasilar atelectasis versus pneumonia. 3. Healing subacute LEFT posterior seventh rib fracture. 4. **An incidental finding of potential clinical significance has been found. 16 mm LEFT thyroid nodule. Recommend nonemergent thyroid sonogram. This follows ACR consensus guidelines: Managing Incidental Thyroid Nodules Detected on Imaging: White Paper of the ACR Incidental Thyroid Findings Committee. J Am Coll Radiol 2015; 12:143-150.** Aortic Atherosclerosis (ICD10-I70.0). Electronically Signed: By: Elon Alas M.D. On: 03/04/2018 19:00    Assessment/Plan Present on Admission: Community acquired pneumonia -admit to [place] -Blood cultures, sputum cultures, urine for Legionella and strep -Cefepime and vancomycin ordered -Solu-Medrol IV every 6 8 hours -DuoNeb scheduled and as needed -Oxygen to keep saturation greater than 88% -PT consult  . Thyroid nodule -Thyroid ultrasound, TSH  Possible PE -Patient with a complaint of chest pains, hemoptysis.  Patient already on Eliquis.  CT states possible PE versus artifact.  No clinical evidence of PE.  Will order d-dimer but this would likely be  elevated, VQ scan will be inconclusive.  For now continue patient's Eliquis.  If shortness of breath persists after treatment for pneumonia can consider a repeat CTA or VQ scan -LE dopplers ordered -daughter aware and in agreement with plan  Gastroenteritis -Patient was on new medication Northera which causes the symptoms.  This was discontinued 7 days ago.  Speech and GI symptoms already improving.  Will order C. difficile and Biofire  CAD -Stable, home meds resumed  Dyslipidemia -Stable, home meds resumed  Atrial fibrillation -  Orthostatic hypotension -Stable, home meds resumed  Rib fracture -Family aware.  Old fall  approximately 3-4 weeks ago.  PT consulted   Nester Bachus 03/04/2018, 9:05 PM

## 2018-03-04 NOTE — Telephone Encounter (Signed)
Spoke to daughter , she states patient caregiver  - patient is having  Difficulty walking very short of breath becoming coughing turning blue.   Daughter states  Patient saw primary last week -  Chest xray done - it was clear per daughter.  DAUGHTERS STATES SHE DID NOT KNOW WHO TO CALL  CARDIOLOGY OR PRIMARY.  Recommend if in distress patient needs to go to ER. DAUGHTER verbalized understanding and states she will take him.

## 2018-03-04 NOTE — ED Provider Notes (Signed)
[facility] EMERGENCY DEPARTMENT Provider Note   CSN: [id number] Arrival date & time: 03/04/18  1144     History   Chief Complaint Chief Complaint  Patient presents with  . Shortness of Breath    HPI Bruce Green is a 84 y.o. male.  HPI Patient is a 84 year old male who is brought to the emergency department with increasing shortness of breath over the past several days.  He was started on azithromycin by his primary care physicians 4 days ago.  Family and patient reports increasing symptoms since then.  They report exertional shortness of breath.  No new orthopnea.  No reports of vomiting or diarrhea.  His appetite is been normal otherwise.  No new medications for the patient.  Patient denies chest pain at this time.  He has no fevers or chills.  He reports his cough is productive.  His symptoms are moderate to severe in severity.   Past Medical History:  Diagnosis Date  . Anxiety   . Cataract   . CKD (chronic kidney disease)   . Colon polyp   . Coronary artery disease 04/21/2014     severe triple vessel  . Dyslipidemia   . GERD (gastroesophageal reflux disease)   . Hiatal hernia    6cm  . Hypertension   . Ischemic cardiomyopathy 03/2014   EF 25-30%  . Pneumonia   . Stricture esophagus    distal  . TIA (transient ischemic attack)   . Vitamin D deficiency     Patient Active Problem List   Diagnosis Date Noted  . Thyroid nodule 03/04/2018  . Dyslipidemia 03/04/2018  . CAP (community acquired pneumonia) 03/04/2018  . Atrial fibrillation ([place])   . Autonomic dysfunction 02/12/2018  . Syncope and collapse 07/14/2017  . Urinary tract infection 07/14/2017  . Mitral regurgitation 07/13/2017  . Syncope 07/13/2017  . Acute lower UTI 07/13/2017  . AAA (abdominal aortic aneurysm) without rupture ([place]) 05/14/2017  . Community acquired pneumonia 05/13/2017  . Gross hematuria 05/13/2017  . Elevated LFTs 05/13/2017  . Hyperbilirubinemia 05/13/2017  .  Pain in joint of left shoulder 05/13/2017  . S/P CABG (coronary artery bypass graft) 06/08/2014  . Orthostatic hypotension 06/08/2014  . SOB (shortness of breath) 05/17/2014  . Eye infection 05/17/2014  . CAD (coronary artery disease) 04/28/2014  . Cardiomyopathy, ischemic 04/25/2014  . S/P CABG x 3 04/24/2014  . Cardiomyopathy of undetermined type ([place]) 04/19/2014  . Systolic CHF with reduced left ventricular function, NYHA class 2 ([place]) 04/19/2014  . Dyspnea on exertion 04/10/2014  . Murmur 04/10/2014  . Cardiomegaly 04/10/2014  . Coronary artery calcification seen on CAT scan 04/10/2014  . GERD with stricture 04/09/2012    Past Surgical History:  Procedure Laterality Date  . APPENDECTOMY    . CARDIAC SURGERY    . CARDIOVERSION N/A 05/19/2017   Procedure: CARDIOVERSION;  Surgeon: Larey Dresser, MD;  Location: [facility] ENDOSCOPY;  Service: Cardiovascular;  Laterality: N/A;  . CHOLECYSTECTOMY    . CORONARY ARTERY BYPASS GRAFT N/A 04/24/2014   Procedure: CORONARY ARTERY BYPASS GRAFTING (CABG);  Surgeon: Gaye Pollack, MD;  Location: [facility];  Service: Open Heart Surgery;  Laterality: N/A;  Times 3 using left internal mammary artery and endoscopically harvested right saphenous vein  . ERCP N/A 11/13/2017   Procedure: ENDOSCOPIC RETROGRADE CHOLANGIOPANCREATOGRAPHY (ERCP);  Surgeon: Clarene Essex, MD;  Location: [facility] ENDOSCOPY;  Service: Endoscopy;  Laterality: N/A;  . ESOPHAGOGASTRODUODENOSCOPY  04/09/2012   Procedure: ESOPHAGOGASTRODUODENOSCOPY (EGD);  Surgeon:  Gatha Mayer, MD;  Location: [facility] ENDOSCOPY;  Service: Endoscopy;  Laterality: N/A;  . INTRAOPERATIVE TRANSESOPHAGEAL ECHOCARDIOGRAM N/A 04/24/2014   Procedure: INTRAOPERATIVE TRANSESOPHAGEAL ECHOCARDIOGRAM;  Surgeon: Gaye Pollack, MD;  Location: [facility] OR;  Service: Open Heart Surgery;  Laterality: N/A;  . LEFT AND RIGHT HEART CATHETERIZATION WITH CORONARY ANGIOGRAM N/A 04/21/2014   Procedure: LEFT AND RIGHT HEART CATHETERIZATION WITH CORONARY  ANGIOGRAM;  Surgeon: Burnell Blanks, MD;  Location: [facility] CATH LAB;  Service: Cardiovascular;  Laterality: N/A;  . SHOULDER SURGERY    . TEE WITHOUT CARDIOVERSION N/A 05/19/2017   Procedure: TRANSESOPHAGEAL ECHOCARDIOGRAM (TEE);  Surgeon: Larey Dresser, MD;  Location: [facility] ENDOSCOPY;  Service: Cardiovascular;  Laterality: N/A;        Home Medications    Prior to Admission medications   Medication Sig Start Date End Date Taking? Authorizing Provider  acetaminophen (TYLENOL) 500 MG tablet Take 500-1,000 mg by mouth 2 (two) times daily as needed for moderate pain.    Yes [provider]  amiodarone (PACERONE) 200 MG tablet Take 1 tablet (200 mg total) by mouth daily. 09/21/17  Yes Croitoru, Mihai, MD  atorvastatin (LIPITOR) 80 MG tablet Take 40 mg by mouth daily.   Yes [provider]  azithromycin (ZITHROMAX) 250 MG tablet Take 250-500 mg by mouth See admin instructions. Take 500 mg by mouth on day 1, then take 250 mg once a day on days 2-5 03/03/18 03/07/18 Yes [provider]  B Complex-C (B-COMPLEX WITH VITAMIN C) tablet Take 1 tablet by mouth daily.   Yes [provider]  Cholecalciferol (VITAMIN D3) 5000 units TABS Take 5,000 Units by mouth every Monday, Wednesday, and Friday.    Yes [provider]  ELIQUIS 2.5 MG TABS tablet TAKE ONE TABLET BY MOUTH TWICE A DAY Patient taking differently: Take 2.5 mg by mouth two times a day 12/30/17  Yes Croitoru, Mihai, MD  fluticasone (FLONASE) 50 MCG/ACT nasal spray Place 1 spray into both nostrils daily as needed for allergies or rhinitis.   Yes [provider]  folic acid (FOLVITE) 269 MCG tablet Take 400 mcg by mouth daily.   Yes [provider]  furosemide (LASIX) 40 MG tablet Take 0.5 tablets (20 mg total) by mouth daily as needed. FOR WT GAIN >3# IN 1 DAY  -OR- >5# IN A WEEK Patient taking differently: Take 20 mg by mouth daily as needed (for a weight gain of 3 pounds or more in a  day or 5 pounds or more in a week).  08/04/17  Yes Barrett, Evelene Croon, PA-C  loratadine (CLARITIN) 10 MG tablet Take 10 mg by mouth daily.    Yes [provider]  Magnesium 400 MG CAPS Take 400 mg by mouth every other day. Patient taking differently: Take 400 mg by mouth every Monday, Wednesday, and Friday.  06/25/17  Yes Croitoru, Mihai, MD  Melatonin 5 MG TABS Take 5-10 mg by mouth at bedtime as needed (for sleep).    Yes [provider]  midodrine (PROAMATINE) 10 MG tablet Take 1 tablet (10 mg total) by mouth 3 (three) times daily with meals. 12/22/17  Yes Croitoru, Mihai, MD  pantoprazole (PROTONIX) 40 MG tablet TAKE 1 TABLET (40 MG TOTAL) BY MOUTH DAILY BEFORE BREAKFAST. Patient taking differently: Take 40 mg by mouth daily with breakfast.  08/28/17  Yes Croitoru, Mihai, MD  polyethylene glycol (MIRALAX / GLYCOLAX) packet Take 17 g by mouth daily as needed for mild constipation. 05/21/17  Yes  Bonnielee Haff, MD  potassium chloride SA (K-DUR,KLOR-CON) 20 MEQ tablet Take 1 tablet (20 mEq total) by mouth daily. TAKE WHEN TAKING YOUR LASIX Patient taking differently: Take 20 mEq by mouth daily as needed (only when taking Lasix for 3-5 lb. weight gain).  06/22/17  Yes Croitoru, Mihai, MD  PROAIR HFA 108 [phone] Base) MCG/ACT inhaler Inhale 2 puffs into the lungs every 4-6 hours as needed for shortness of breath or wheezing 03/02/18  Yes [provider]  Probiotic Product (PROBIOTIC PO) Take 1 capsule by mouth daily. 30 BILLION strength   Yes [provider]  tamsulosin (FLOMAX) 0.4 MG CAPS capsule Take 1 capsule (0.4 mg total) by mouth daily after supper. 05/21/17  Yes Bonnielee Haff, MD  Elastic Bandages & Supports (ABDOMINAL BINDER/ELASTIC MED) MISC Wear daily 02/12/18   Croitoru, Dani Gobble, MD    Family History Family History  Problem Relation Age of Onset  . Heart attack Mother   . Kidney disease Father   . Colon cancer Brother   . Dementia Brother   . Aneurysm Brother     . Breast cancer Sister        x 2  . Arthritis Sister   . Osteoporosis Sister     Social History Social History   Tobacco Use  . Smoking status: Never Smoker  . Smokeless tobacco: Never Used  Substance Use Topics  . Alcohol use: Yes    Alcohol/week: 0.6 oz    Types: 1 Standard drinks or equivalent per week    Comment: rare  . Drug use: No     Allergies   Other; Septra [sulfamethoxazole-trimethoprim]; Tramadol; and Sulfa antibiotics   Review of Systems Review of Systems  All other systems reviewed and are negative.    Physical Exam Updated Vital Signs BP (!) 144/63   Pulse 89   Temp 99 F (37.2 C) (Oral)   Resp 18   Ht 5\' 10"  (1.778 m)   Wt 70.8 kg (156 lb)   SpO2 95%   BMI 22.38 kg/m   Physical Exam  Constitutional: He is oriented to person, place, and time.  HENT:  Head: Normocephalic and atraumatic.  Eyes: EOM are normal.  Neck: Normal range of motion.  Cardiovascular: Normal rate, regular rhythm and intact distal pulses.  Pulmonary/Chest: No respiratory distress.  Mild tachypnea.  No accessory muscle use.  Rhonchi throughout all lung fields  Abdominal: He exhibits no distension. There is no tenderness.  Musculoskeletal: Normal range of motion.  Neurological: He is alert and oriented to person, place, and time.  Skin: Skin is warm and dry.  Nursing note and vitals reviewed.    ED Treatments / Results  Labs (all labs ordered are listed, but only abnormal results are displayed) Labs Reviewed  BASIC METABOLIC PANEL - Abnormal; Notable for the following components:      Result Value   Glucose, Bld 112 (*)    BUN 23 (*)    Creatinine, Ser 1.36 (*)    Calcium 8.3 (*)    GFR calc non Af Amer 42 (*)    GFR calc Af Amer 49 (*)    All other components within normal limits  CBC - Abnormal; Notable for the following components:   RDW 15.7 (*)    All other components within normal limits  I-STAT CG4 LACTIC ACID, ED - Abnormal; Notable for the  following components:   Lactic Acid, Venous 2.11 (*)    All other components within normal limits  CULTURE,  BLOOD (ROUTINE X 2)  CULTURE, BLOOD (ROUTINE X 2)  LEGIONELLA SPECIES CULTURE  C DIFFICILE QUICK SCREEN W PCR REFLEX  GASTROINTESTINAL PANEL BY PCR, STOOL (REPLACES STOOL CULTURE)  STREP PNEUMONIAE URINARY ANTIGEN  BASIC METABOLIC PANEL  CBC  PROTIME-INR  TSH  I-STAT TROPONIN, ED    EKG EKG Interpretation  Date/Time:  Thursday March 04 2018 11:50:13 EDT Ventricular Rate:  67 PR Interval:  168 QRS Duration: 126 QT Interval:  570 QTC Calculation: 602 R Axis:   16 Text Interpretation:  Normal sinus rhythm Non-specific intra-ventricular conduction block Inferior infarct , age undetermined Abnormal ECG No significant change was found Confirmed by Jola Schmidt [phone]) on 03/04/2018 11:32:39 PM   Radiology Dg Chest 2 View  Result Date: 03/04/2018 CLINICAL DATA:  Short of breath. EXAM: CHEST - 2 VIEW COMPARISON:  03/02/2018 FINDINGS: There stable changes from prior CABG surgery. Cardiac silhouette is mildly enlarged. No mediastinal or hilar masses.  No evidence of adenopathy. Lungs are mildly hyperexpanded. There coarse reticular opacities in the bases, left greater than right, consistent with fibrosis. No evidence of pneumonia or pulmonary edema. Small nodule suggested in the right upper lobe on the prior exam is not appreciated on the current exam. No pleural effusion or pneumothorax. Skeletal structures are demineralized but intact. IMPRESSION: 1. No acute cardiopulmonary disease. 2. Mild stable cardiomegaly and stable changes from prior CABG surgery. 3. Mild lung hyperexpansion. Left greater than right lung base fibrosis. Electronically Signed   By: Lajean Manes M.D.   On: 03/04/2018 13:04   Ct Angio Chest Pe W And/or Wo Contrast  Addendum Date: 03/04/2018   ADDENDUM REPORT: 03/04/2018 19:14 ADDENDUM: Critical Value/emergent results were called by telephone at the time of  interpretation on 03/04/2018 at 7:10 pm to Dr. Jola Schmidt , who verbally acknowledged these results. Electronically Signed   By: Elon Alas M.D.   On: 03/04/2018 19:14   Result Date: 03/04/2018 CLINICAL DATA:  Dyspnea for a few months. History of pneumonia, cardiomyopathy. EXAM: CT ANGIOGRAPHY CHEST WITH CONTRAST TECHNIQUE: Multidetector CT imaging of the chest was performed using the standard protocol during bolus administration of intravenous contrast. Multiplanar CT image reconstructions and MIPs were obtained to evaluate the vascular anatomy. CONTRAST:  78mL ISOVUE-370 IOPAMIDOL (ISOVUE-370) INJECTION 76% COMPARISON:  Chest radiograph March 04, 2018 and CT chest May 21, 2014 FINDINGS: CARDIOVASCULAR: Adequate contrast opacification of the pulmonary artery's. Main pulmonary artery is not enlarged. Mild respiratory motion with faint central hypodensity LEFT lower lobe segmental branch (axial 234/425). The heart is moderately enlarged. Status post median sternotomy for CABG. Severe coronary artery calcifications. Thoracic aorta is 3.6 cm in transaxial dimension, stable from prior imaging. MEDIASTINUM/NODES: No lymphadenopathy by CT size criteria. LUNGS/PLEURA: Tracheobronchial tree is patent, no pneumothorax. Diffuse moderate bronchial wall thickening. Patchy bilateral lower consolidation. 3 mm calcified granuloma RIGHT upper lobe. Punctate calcifications LEFT lower lobe. Small RIGHT pleural effusion. UPPER ABDOMEN: Small to moderate hiatal hernia. Status post cholecystectomy. 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 MUSCULOSKELETAL: Severe glenohumeral osteoarthrosis. Healing new LEFT posterior seventh rib fracture. Review of the MIP images confirms the above findings. IMPRESSION: 1. Nonocclusive LEFT lower lobe segmental artery pulmonary embolus versus artifact. 2. Bronchial wall thickening increased from prior examination most compatible with bronchitis. Patchy bibasilar atelectasis  versus pneumonia. 3. Healing subacute LEFT posterior seventh rib fracture. 4. **An incidental finding of potential clinical significance has been found. 16 mm LEFT thyroid nodule. Recommend nonemergent thyroid sonogram. This follows ACR consensus guidelines:  Managing Incidental Thyroid Nodules Detected on Imaging: White Paper of the ACR Incidental Thyroid Findings Committee. J Am Coll Radiol 2015; 12:143-150.** Aortic Atherosclerosis (ICD10-I70.0). Electronically Signed: By: Elon Alas M.D. On: 03/04/2018 19:00    Procedures Procedures (including critical care time)  Medications Ordered in ED Medications  iopamidol (ISOVUE-370) 76 % injection (has no administration in time range)  apixaban (ELIQUIS) tablet 5 mg (has no administration in time range)  ceFEPIme (MAXIPIME) 2 g in sodium chloride 0.9 % 100 mL IVPB (0 g Intravenous Stopped 03/04/18 2223)  vancomycin (VANCOCIN) 2,000 mg in sodium chloride 0.9 % 500 mL IVPB (has no administration in time range)  amiodarone (PACERONE) tablet 200 mg (has no administration in time range)  atorvastatin (LIPITOR) tablet 40 mg (has no administration in time range)  loratadine (CLARITIN) tablet 10 mg (has no administration in time range)  Melatonin TABS 6-9 mg (has no administration in time range)  midodrine (PROAMATINE) tablet 10 mg (has no administration in time range)  pantoprazole (PROTONIX) EC tablet 40 mg (has no administration in time range)  tamsulosin (FLOMAX) capsule 0.4 mg (has no administration in time range)  ipratropium-albuterol (DUONEB) 0.5-2.5 (3) MG/3ML nebulizer solution 3 mL (has no administration in time range)  polyethylene glycol (MIRALAX / GLYCOLAX) packet 17 g (has no administration in time range)  ondansetron (ZOFRAN) tablet 4 mg (has no administration in time range)    Or  ondansetron (ZOFRAN) injection 4 mg (has no administration in time range)  vancomycin (VANCOCIN) IVPB 1000 mg/200 mL premix (has no administration in time  range)  methylPREDNISolone sodium succinate (SOLU-MEDROL) 125 mg/2 mL injection 60 mg (has no administration in time range)  ipratropium-albuterol (DUONEB) 0.5-2.5 (3) MG/3ML nebulizer solution 3 mL (has no administration in time range)  metoprolol tartrate (LOPRESSOR) injection 5 mg (has no administration in time range)  folic acid (FOLVITE) tablet 1 mg (has no administration in time range)  albuterol (PROVENTIL) (2.5 MG/3ML) 0.083% nebulizer solution 5 mg (5 mg Nebulization Given 03/04/18 1718)  ipratropium (ATROVENT) nebulizer solution 0.5 mg (0.5 mg Nebulization Given 03/04/18 1718)  iopamidol (ISOVUE-370) 76 % injection 80 mL (80 mLs Intravenous Contrast Given 03/04/18 1816)  albuterol (PROVENTIL) (2.5 MG/3ML) 0.083% nebulizer solution 5 mg (5 mg Nebulization Given 03/04/18 2153)     Initial Impression / Assessment and Plan / ED Course  I have reviewed the triage vital signs and the nursing notes.  Pertinent labs & imaging results that were available during my care of the patient were reviewed by me and considered in my medical decision making (see chart for details).     CT imaging concerning for developing pneumonia in the setting of bronchitis.  There is questionable filling defect which could represent a small distal PE however the patient is compliant with his medications.  Not convinced clinically that this is a pulmonary embolism and instead he will be treated with his home Eliquis.  I do believe this to be failed outpatient treatment of community acquired pneumonia.  IV antibiotics now.  Patient and family updated.  Patient confirms to me that he is a full code.  Family is in agreement with this.  Final Clinical Impressions(s) / ED Diagnoses   Final diagnoses:  Community acquired pneumonia of left lower lobe of lung ([place])  Abnormal imaging of thyroid    ED Discharge Orders    None       Jola Schmidt, MD 03/04/18 2333

## 2018-03-05 ENCOUNTER — Inpatient Hospital Stay (HOSPITAL_COMMUNITY): Payer: Medicare Other

## 2018-03-05 DIAGNOSIS — R9389 Abnormal findings on diagnostic imaging of other specified body structures: Secondary | ICD-10-CM

## 2018-03-05 DIAGNOSIS — I2699 Other pulmonary embolism without acute cor pulmonale: Secondary | ICD-10-CM

## 2018-03-05 LAB — CBC
HEMATOCRIT: 38.9 % — AB (ref 39.0–52.0)
Hemoglobin: 12.2 g/dL — ABNORMAL LOW (ref 13.0–17.0)
MCH: 28.2 pg (ref 26.0–34.0)
MCHC: 31.4 g/dL (ref 30.0–36.0)
MCV: 90 fL (ref 78.0–100.0)
PLATELETS: 153 10*3/uL (ref 150–400)
RBC: 4.32 MIL/uL (ref 4.22–5.81)
RDW: 15.6 % — AB (ref 11.5–15.5)
WBC: 8.1 10*3/uL (ref 4.0–10.5)

## 2018-03-05 LAB — TSH: TSH: 0.234 u[IU]/mL — ABNORMAL LOW (ref 0.350–4.500)

## 2018-03-05 LAB — BASIC METABOLIC PANEL
Anion gap: 10 (ref 5–15)
BUN: 23 mg/dL — AB (ref 6–20)
CO2: 21 mmol/L — AB (ref 22–32)
CREATININE: 1.31 mg/dL — AB (ref 0.61–1.24)
Calcium: 8 mg/dL — ABNORMAL LOW (ref 8.9–10.3)
Chloride: 110 mmol/L (ref 101–111)
GFR calc Af Amer: 51 mL/min — ABNORMAL LOW (ref >60)
GFR calc non Af Amer: 44 mL/min — ABNORMAL LOW (ref >60)
GLUCOSE: 193 mg/dL — AB (ref 65–99)
POTASSIUM: 3.8 mmol/L (ref 3.5–5.1)
Sodium: 141 mmol/L (ref 135–145)

## 2018-03-05 LAB — STREP PNEUMONIAE URINARY ANTIGEN: Strep Pneumo Urinary Antigen: NEGATIVE

## 2018-03-05 LAB — PROTIME-INR
INR: 1.38
Prothrombin Time: 16.8 seconds — ABNORMAL HIGH (ref 11.4–15.2)

## 2018-03-05 MED ORDER — IPRATROPIUM-ALBUTEROL 0.5-2.5 (3) MG/3ML IN SOLN
3.0000 mL | Freq: Three times a day (TID) | RESPIRATORY_TRACT | Status: DC
  Administered 2018-03-06 – 2018-03-08 (×7): 3 mL via RESPIRATORY_TRACT
  Filled 2018-03-05 (×9): qty 3

## 2018-03-05 MED ORDER — IPRATROPIUM-ALBUTEROL 0.5-2.5 (3) MG/3ML IN SOLN: 3.0000 mL | Freq: Three times a day (TID) | RESPIRATORY_TRACT | Status: DC

## 2018-03-05 NOTE — Progress Notes (Signed)
*  Preliminary Results* Bilateral lower extremity venous duplex completed. Bilateral lower extremities are negative for deep vein thrombosis. There is no evidence of Baker's cyst bilaterally.  03/05/2018 6:38 PM Maudry Mayhew, BS, RVT, RDCS, RDMS

## 2018-03-05 NOTE — Consult Note (Signed)
PULMONARY / CRITICAL CARE MEDICINE   Name: Bruce Green MRN: [id number] DOB: 14-Apr-1919    ADMISSION DATE:  03/04/2018 CONSULTATION DATE:03/05/18  REFERRING MD:  Hospitalist  CHIEF COMPLAINT:  Shortness of Breath, Cough  HISTORY OF PRESENT ILLNESS:     This is a 84 y/o male who has not been feeling well since 2-3 weeks. Dizzy, unbalanced.  Normally moves well. Also has SOB. He has a wet sounding cough. He reports a wheeze. He reports chills and chills.  He has been having some diarrhea there last few weeks. hes had some nausea and vomiting. He has a good appetite. He's been to see his PCP and diagnosed with a virus, eventually a Zpac was prescribed, the patient didn't improve significantly with the Z Pack. He's not able to lay flat. He was brought to the EE. History provided by the patients daughter and caregiver.  PAST MEDICAL HISTORY :  He  has a past medical history of Anxiety, Cataract, CKD (chronic kidney disease), Colon polyp, Coronary artery disease (04/21/2014 ), Dyslipidemia, GERD (gastroesophageal reflux disease), Hiatal hernia, Hypertension, Ischemic cardiomyopathy (03/2014), Pneumonia, Stricture esophagus, TIA (transient ischemic attack), and Vitamin D deficiency.  PAST SURGICAL HISTORY: He  has a past surgical history that includes Cholecystectomy; Shoulder surgery; Appendectomy; Esophagogastroduodenoscopy (04/09/2012); Coronary artery bypass graft (N/A, 04/24/2014); Intraoprative transesophageal echocardiogram (N/A, 04/24/2014); left and right heart catheterization with coronary angiogram (N/A, 04/21/2014); Cardiac surgery; TEE without cardioversion (N/A, 05/19/2017); Cardioversion (N/A, 05/19/2017); and ERCP (N/A, 11/13/2017).  Allergies  Allergen Reactions  . Other Other (See Comments)    General anesthesia - sensitivity,  causes severe lethargy   . Septra [Sulfamethoxazole-Trimethoprim] Hives  . Tramadol Other (See Comments)    Caused low b/p   . Sulfa Antibiotics Rash    No  current facility-administered medications on file prior to encounter.    Current Outpatient Medications on File Prior to Encounter  Medication Sig  . acetaminophen (TYLENOL) 500 MG tablet Take 500-1,000 mg by mouth 2 (two) times daily as needed for moderate pain.   Marland Kitchen amiodarone (PACERONE) 200 MG tablet Take 1 tablet (200 mg total) by mouth daily.  Marland Kitchen atorvastatin (LIPITOR) 80 MG tablet Take 40 mg by mouth daily.  Marland Kitchen azithromycin (ZITHROMAX) 250 MG tablet Take 250-500 mg by mouth See admin instructions. Take 500 mg by mouth on day 1, then take 250 mg once a day on days 2-5  . B Complex-C (B-COMPLEX WITH VITAMIN C) tablet Take 1 tablet by mouth daily.  . Cholecalciferol (VITAMIN D3) 5000 units TABS Take 5,000 Units by mouth every Monday, Wednesday, and Friday.   Marland Kitchen ELIQUIS 2.5 MG TABS tablet TAKE ONE TABLET BY MOUTH TWICE A DAY (Patient taking differently: Take 2.5 mg by mouth two times a day)  . fluticasone (FLONASE) 50 MCG/ACT nasal spray Place 1 spray into both nostrils daily as needed for allergies or rhinitis.  . folic acid (FOLVITE) 025 MCG tablet Take 400 mcg by mouth daily.  . furosemide (LASIX) 40 MG tablet Take 0.5 tablets (20 mg total) by mouth daily as needed. FOR WT GAIN >3# IN 1 DAY  -OR- >5# IN A WEEK (Patient taking differently: Take 20 mg by mouth daily as needed (for a weight gain of 3 pounds or more in a day or 5 pounds or more in a week). )  . loratadine (CLARITIN) 10 MG tablet Take 10 mg by mouth daily.   . Magnesium 400 MG CAPS Take 400 mg by mouth every other day. (Patient  taking differently: Take 400 mg by mouth every Monday, Wednesday, and Friday. )  . Melatonin 5 MG TABS Take 5-10 mg by mouth at bedtime as needed (for sleep).   . midodrine (PROAMATINE) 10 MG tablet Take 1 tablet (10 mg total) by mouth 3 (three) times daily with meals.  . pantoprazole (PROTONIX) 40 MG tablet TAKE 1 TABLET (40 MG TOTAL) BY MOUTH DAILY BEFORE BREAKFAST. (Patient taking differently: Take 40 mg by  mouth daily with breakfast. )  . polyethylene glycol (MIRALAX / GLYCOLAX) packet Take 17 g by mouth daily as needed for mild constipation.  . potassium chloride SA (K-DUR,KLOR-CON) 20 MEQ tablet Take 1 tablet (20 mEq total) by mouth daily. TAKE WHEN TAKING YOUR LASIX (Patient taking differently: Take 20 mEq by mouth daily as needed (only when taking Lasix for 3-5 lb. weight gain). )  . PROAIR HFA 108 (90 Base) MCG/ACT inhaler Inhale 2 puffs into the lungs every 4-6 hours as needed for shortness of breath or wheezing  . Probiotic Product (PROBIOTIC PO) Take 1 capsule by mouth daily. 30 BILLION strength  . tamsulosin (FLOMAX) 0.4 MG CAPS capsule Take 1 capsule (0.4 mg total) by mouth daily after supper.  Regino Schultze Bandages & Supports (ABDOMINAL BINDER/ELASTIC MED) MISC Wear daily    FAMILY HISTORY:  His indicated that his mother is deceased. He indicated that his father is deceased. He indicated that his maternal grandmother is deceased. He indicated that his maternal grandfather is deceased. He indicated that his paternal grandmother is deceased. He indicated that his paternal grandfather is deceased.   SOCIAL HISTORY: He  reports that he has never smoked. He has never used smokeless tobacco. He reports that he drinks about 0.6 oz of alcohol per week. He reports that he does not use drugs.  REVIEW OF SYSTEMS:    The patient denies anorexia, fever, chills, dizzy, cough, weight loss,, vision loss, decreased hearing, hoarseness, wheeze, chest pain, syncope, dyspnea on exertion, peripheral edema, balance deficits, hemoptysis, abdominal pain, melena, hematochezia, severe indigestion/heartburn, hematuria, incontinence, genital sores, muscle weakness, suspicious skin lesions, transient blindness, difficulty walking, depression, unusual weight change, abnormal bleeding, enlarged lymph nodes, angioedema, and breast masses.      VITAL SIGNS: BP 139/65   Pulse 66   Temp 99 F (37.2 C) (Oral)   Resp  20   Ht 5\' 10"  (1.778 m)   Wt 156 lb (70.8 kg)   SpO2 90%   BMI 22.38 kg/m     INTAKE / OUTPUT: I/O last 3 completed shifts: In: 600 [IV Piggyback:600] Out: -   PHYSICAL EXAMINATION:  General:  Alert and oriented times three, well developed and nourished, no acute distress. Patient appears quite spry Eyes: PERRLA, pink conjunctiva, no scleral icterus ENT: Moist oral mucosa, neck supple, no thyromegaly Lungs:  has bibasilar coarse crackles, aslight expiratory wheez Cardiovascular: regular rate and rhythm, no regurgitation, no gallops, no murmurs. No carotid bruits, no JVD Abdomen: soft, positive BS, non-tender, non-distended, no organomegaly, not an acute abdomen GU: not examined Neuro: CN II - XII grossly intact, sensation intact Musculoskeletal: strength 5/5 all extremities, no clubbing, cyanosis or edema  Extr: [place]/c/e     LABS:  BMET Recent Labs  Lab 03/04/18 1200 03/05/18 0615  NA 142 141  K 3.6 3.8  CL 109 110  CO2 22 21*  BUN 23* 23*  CREATININE 1.36* 1.31*  GLUCOSE 112* 193*    Electrolytes Recent Labs  Lab 03/04/18 1200 03/05/18 0615  CALCIUM 8.3* 8.0*  CBC Recent Labs  Lab 03/04/18 1200 03/05/18 0615  WBC 6.4 8.1  HGB 14.5 12.2*  HCT 45.8 38.9*  PLT 151 153    Coag's Recent Labs  Lab 03/05/18 0615  INR 1.38    Sepsis Markers Recent Labs  Lab 03/04/18 2127  LATICACIDVEN 2.11*    ABG No results for input(s): PHART, PCO2ART, PO2ART in the last 168 hours.  Liver Enzymes No results for input(s): AST, ALT, ALKPHOS, BILITOT, ALBUMIN in the last 168 hours.  Cardiac Enzymes No results for input(s): TROPONINI, PROBNP in the last 168 hours.  Glucose No results for input(s): GLUCAP in the last 168 hours.  Imaging Ct Angio Chest Pe W And/or Wo Contrast  Addendum Date: 03/04/2018   ADDENDUM REPORT: 03/04/2018 19:14 ADDENDUM: Critical Value/emergent results were called by telephone at the time of interpretation on 03/04/2018 at  7:10 pm to Dr. Jola Schmidt , who verbally acknowledged these results. Electronically Signed   By: Elon Alas M.D.   On: 03/04/2018 19:14   Result Date: 03/04/2018 CLINICAL DATA:  Dyspnea for a few months. History of pneumonia, cardiomyopathy. EXAM: CT ANGIOGRAPHY CHEST WITH CONTRAST TECHNIQUE: Multidetector CT imaging of the chest was performed using the standard protocol during bolus administration of intravenous contrast. Multiplanar CT image reconstructions and MIPs were obtained to evaluate the vascular anatomy. CONTRAST:  44mL ISOVUE-370 IOPAMIDOL (ISOVUE-370) INJECTION 76% COMPARISON:  Chest radiograph March 04, 2018 and CT chest May 21, 2014 FINDINGS: CARDIOVASCULAR: Adequate contrast opacification of the pulmonary artery's. Main pulmonary artery is not enlarged. Mild respiratory motion with faint central hypodensity LEFT lower lobe segmental branch (axial 234/425). The heart is moderately enlarged. Status post median sternotomy for CABG. Severe coronary artery calcifications. Thoracic aorta is 3.6 cm in transaxial dimension, stable from prior imaging. MEDIASTINUM/NODES: No lymphadenopathy by CT size criteria. LUNGS/PLEURA: Tracheobronchial tree is patent, no pneumothorax. Diffuse moderate bronchial wall thickening. Patchy bilateral lower consolidation. 3 mm calcified granuloma RIGHT upper lobe. Punctate calcifications LEFT lower lobe. Small RIGHT pleural effusion. UPPER ABDOMEN: Small to moderate hiatal hernia. Status post cholecystectomy. 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 MUSCULOSKELETAL: Severe glenohumeral osteoarthrosis. Healing new LEFT posterior seventh rib fracture. Review of the MIP images confirms the above findings. IMPRESSION: 1. Nonocclusive LEFT lower lobe segmental artery pulmonary embolus versus artifact. 2. Bronchial wall thickening increased from prior examination most compatible with bronchitis. Patchy bibasilar atelectasis versus pneumonia. 3. Healing  subacute LEFT posterior seventh rib fracture. 4. **An incidental finding of potential clinical significance has been found. 16 mm LEFT thyroid nodule. Recommend nonemergent thyroid sonogram. This follows ACR consensus guidelines: Managing Incidental Thyroid Nodules Detected on Imaging: White Paper of the ACR Incidental Thyroid Findings Committee. J Am Coll Radiol 2015; 12:143-150.** Aortic Atherosclerosis (ICD10-I70.0). Electronically Signed: By: Elon Alas M.D. On: 03/04/2018 19:00   US Thyroid  Result Date: 03/05/2018 CLINICAL DATA:  Incidental on CT. 84 year old male with a multinodular thyroid gland noted on recent CT imaging. EXAM: THYROID ULTRASOUND TECHNIQUE: Ultrasound examination of the thyroid gland and adjacent soft tissues was performed. COMPARISON:  CTA of the chest 03/04/2018 FINDINGS: Parenchymal Echotexture: Moderately heterogenous Isthmus: 0.3 cm Right lobe: 5.2 x 2.1 x 1.7 cm Left lobe: 4.7 x 2.0 x 1.4 cm _________________________________________________________ Estimated total number of nodules >/= 1 cm: 2 Number of spongiform nodules >/=  2 cm not described below (TR1): 0 Number of mixed cystic and solid nodules >/= 1.5 cm not described below ([place]): 0 _________________________________________________________ Nodule # 1 (right): Location: Right;  Mid Maximum size: 1.0 cm; Other 2 dimensions: 0.8 x 0.9 cm Composition: solid/almost completely solid (2) Echogenicity: hypoechoic (2) Shape: not taller-than-wide (0) Margins: smooth (0) Echogenic foci: none (0) ACR TI-RADS total points: 4. ACR TI-RADS risk category: TR4 (4-6 points). ACR TI-RADS recommendations: *Given size (>/= 1 - 1.4 cm) and appearance, a follow-up ultrasound in 1 year should be considered based on TI-RADS criteria. _________________________________________________________ Nodule # 1 (left): Location: Left; Inferior Maximum size: 2.4 cm; Other 2 dimensions: 1.4 x 1.4 cm Composition: solid/almost completely solid (2)  Echogenicity: hypoechoic (2) Shape: not taller-than-wide (0) Margins: ill-defined (0) Echogenic foci: none (0) ACR TI-RADS total points: 4. ACR TI-RADS risk category: TR4 (4-6 points). ACR TI-RADS recommendations: **Given size (>/= 1.5 cm) and appearance, fine needle aspiration of this moderately suspicious nodule should be considered based on TI-RADS criteria. _________________________________________________________ Numerous additional small thyroid cysts and nodules are scattered throughout the gland. These do not meet criteria for further evaluation. IMPRESSION: 1. The 2.4 cm TI-RADS category 4 nodule in the left inferior gland technically meets criteria for consideration of fine-needle aspiration biopsy. However, given patient demographics, if they would not be considered a surgical candidate for thyroidectomy, the utility of biopsy is of uncertain clinical benefit and continued surveillance (clinical or with follow-up ultrasound at 1 year) could be considered. 2. The 1.0 cm TI-RADS category 4 nodule in the right mid gland meets criteria for follow-up thyroid ultrasound in 1 year. The above is in keeping with the ACR TI-RADS recommendations - J Am Coll Radiol 2017;14:587-595. Electronically Signed   By: Jacqulynn Cadet M.D.   On: 03/05/2018 07:47      ASSESSMENT / PLAN: This patient presents with some shortness of breath, cough , malaise over the past few days. Has been a little dizzy and off balance at times as well.  PULMONARY The patient has been short of breath He has had a "juicy" cough at times productive. When I inquired he denied fever or chills. He does  drop hhis02 sat presently on ambulation. The patient has a history of esophageal stricture. However, he denies significant obstructive episodes or much in the way of reflux lately. I reviewed his CT scan .There are interstial-alveoalr changes at the posterior bases not evident  over 3 years ago on his last study. In addition, there  appears to be evidence of bronchiectasis in those areas as well This could represent evidence of serendipitous nocturnal aspiration  is not likely he develop pulmonary fibrosis at this point in time. I don' think the ? Defect in the left lower lobe pulm artery is relevant to the current circumstances. I do not think the patient had an acute PE The patient does have evidence of COPD. He had a spirometry a few years ago that suggested moderate obstructive lung disease. His WBC is within normal limits  Plan I agree with he use of HHN. I believe a short course of abx ( other than Zithromax) is reasonable as well. If the patient develops recurring respiratory issue he should prboablly see his gi man to make sure his esophagus is not again constricted  CARDIOVASCULAR The patient does have history of CAD but no recent events. Has a previous history of CABG. .Denies chest pain. He does have a history of moderately severe LV dysfn in the past per his ECHO of 9 months ago with LVEF of 25-30%. Has additional history of moderate MR. He has long hisistry of Atrial fib for which he takes Eliquis chronically. I  don't bl;eieve he is in acute CHF presently, his heart rate is well controlled.        FAMILY   I spoke to the patient's daughter who was with him during the examination -    Micheal Likens MD Pulmonary and [place] Pager: [phone]  03/05/2018, 2:29 PM

## 2018-03-05 NOTE — Progress Notes (Deleted)
Pt was assessed with daughter arriving in last portion of eval and agreed that pt is more weak but has been having diarrhea and more weak lately.  Lives alone and asking for SNF to restore his mobility to a more reasonable safety for home.  Daughter has a sick husband and cannot stay with pt at home, but will be helpful to him.      03/05/18 1100  PT Visit Information  Last PT Received On 03/05/18  Assistance Needed +1  History of Present Illness 84 yo male with onset of CAP was admitted, has elevated lactic acid and recent major diarrhea, on enteric precautions for now.  PMHx:  CKD, CAD, CABG, HTN, AAA, PNA, TIA, ischemic cardiomyopathy  Precautions  Precautions Fall (on O2 3L)  Precaution Comments ck O2 sats  Restrictions  Weight Bearing Restrictions No  Home Living  Family/patient expects to be discharged to: Private residence  Living Arrangements Alone  Available Help at Discharge Family;Available PRN/intermittently  Type of Home House  Home Access Stairs to enter  Entrance Stairs-Number of Steps 3  Entrance Stairs-Rails Right;Left  Home Layout Laundry or work area in basement;Able to live on main level with bedroom/bathroom  Bathroom Shower/Tub Walk-in Patent examiner - 2 wheels;Cane - single point;Grab bars - toilet  Additional Comments pt was recently using RW due to his weakness from illnesses  Prior Function  Level of [place] Needs assistance  Gait / Transfers Assistance Needed has been getting some family assistance from daughter for household  ADL's / [place] assisted for for meals and housekeeping  Communication  Communication HOH  Pain Assessment  Pain Assessment No/denies pain  Cognition  Arousal/Alertness Awake/alert  Behavior During Therapy Impulsive  Overall Cognitive Status Difficult to assess  General Comments daughter does not seem surprised by his presentation and states he is less steady  then usual  Difficult to assess due to Hard of hearing/deaf (slow to respond to verbal cues)  Upper Extremity Assessment  Upper Extremity Assessment Generalized weakness  Lower Extremity Assessment  Lower Extremity Assessment Generalized weakness  Cervical / Trunk Assessment  Cervical / Trunk Assessment Normal  Bed Mobility  Overal bed mobility Needs Assistance  Bed Mobility Supine to Sit  Supine to sit Mod assist  General bed mobility comments requires both physical assist and cues for his mobility  Transfers  Overall transfer level Needs assistance  Equipment used Rolling walker (2 wheeled);1 person hand held assist  Transfers Sit to/from Stand  Sit to Stand Min assist;Mod assist  General transfer comment reminders for hand placement every trial  Ambulation/Gait  Ambulation/Gait assistance Min assist;Mod assist (occas really have to steady him)  Ambulation Distance (Feet) 90 Feet  Assistive device Rolling walker (2 wheeled)  Gait Pattern/deviations Step-through pattern;Decreased stride length;Wide base of support;Drifts right/left;Trunk flexed  General Gait Details pt is slightly buckled in appearance at knees and lists to the side at times, no regard for the lines of telemetry and O2  Gait velocity reduced  Gait velocity interpretation <1.8 ft/sec, indicate of risk for recurrent falls  Balance  Overall balance assessment Needs assistance  Sitting-balance support Feet supported  Sitting balance-Leahy Scale Good  Standing balance support Bilateral upper extremity supported;During functional activity  Standing balance-Leahy Scale Poor  Exercises  Exercises Other exercises (LE strength was grossly WFL x hips and hams 4-)  PT - End of Session  Equipment Utilized During Treatment Gait belt;Oxygen  Activity Tolerance Patient limited  by fatigue;Other (comment) (imbalance and weakness)  Patient left in chair;with call bell/phone within reach;with family/visitor present;with chair  alarm set  Nurse Communication Mobility status  PT Assessment  PT Recommendation/Assessment Patient needs continued PT services  PT Visit Diagnosis Unsteadiness on feet (R26.81);Muscle weakness (generalized) (M62.81);Difficulty in walking, not elsewhere classified (R26.2)  PT Problem List Decreased strength;Decreased range of motion;Decreased activity tolerance;Decreased balance;Decreased mobility;Decreased coordination;Decreased knowledge of use of DME;Decreased safety awareness;Cardiopulmonary status limiting activity  Barriers to Discharge Decreased caregiver support;Inaccessible home environment  Barriers to Discharge Comments home with stairs and no assistance regularly  PT Plan  PT Frequency (ACUTE ONLY) Min 2X/week  PT Treatment/Interventions (ACUTE ONLY) DME instruction;Gait training;Stair training;Functional mobility training;Therapeutic activities;Therapeutic exercise;Balance training;Neuromuscular re-education;Patient/family education  AM-PAC PT "6 Clicks" Daily Activity Outcome Measure  Difficulty turning over in bed (including adjusting bedclothes, sheets and blankets)? 3  Difficulty moving from lying on back to sitting on the side of the bed?  1  Difficulty sitting down on and standing up from a chair with arms (e.g., wheelchair, bedside commode, etc,.)? 1  Help needed moving to and from a bed to chair (including a wheelchair)? 3  Help needed walking in hospital room? 2  Help needed climbing 3-5 steps with a railing?  1  6 Click Score 11  Mobility G Code  CL  PT Recommendation  Follow Up Recommendations SNF  PT equipment Rolling walker with 5" wheels  Individuals Consulted  Consulted and Agree with Results and Recommendations Patient;Family member/caregiver  Family Member Consulted daughter is present after walk  Acute Rehab PT Goals  Patient Stated Goal to get home and feel better  PT Goal Formulation With patient/family  Time For Goal Achievement 03/19/18  Potential to  Achieve Goals Good  PT Time Calculation  PT Start Time (ACUTE ONLY) 0856  PT Stop Time (ACUTE ONLY) 6789  PT Time Calculation (min) (ACUTE ONLY) 42 min  PT G-Codes **NOT FOR INPATIENT CLASS**  Functional Assessment Tool Used AM-PAC 6 Clicks Basic Mobility  PT General Charges  $$ ACUTE PT VISIT 1 Visit  PT Evaluation  $PT Eval Moderate Complexity 1 Mod  PT Treatments  $Gait Training 8-22 mins  $Therapeutic Exercise 8-22 mins  Written Expression  Dominant Hand Right   Mee Hives, PT MS Acute Rehab Dept. Number: [place] and [place]

## 2018-03-05 NOTE — ED Notes (Signed)
Patient was given a Cup of Ice Water. 

## 2018-03-05 NOTE — Clinical Social Work Note (Signed)
Clinical Social Work Assessment  Patient Details  Name: Bruce Green MRN: [id number] Date of Birth: 07/01/1919  Date of referral:  03/05/18               Reason for consult:  Facility Placement, Discharge Planning                Permission sought to share information with:  Facility Sport and exercise psychologist, Family Supports Permission granted to share information::  Yes, Verbal Permission Granted  Name::     [place]::     Relationship::  daughters  Contact Information:  [phone] & [phone]  Housing/Transportation Living arrangements for the past 2 months:  [place] of Information:  Patient, Adult Children Patient Interpreter Needed:  None Criminal Activity/Legal Involvement Pertinent to Current Situation/Hospitalization:  No - Comment as needed Significant Relationships:  Adult Children, Warehouse manager, Other(Comment)(caregivers ) Lives with:  Self, Other (Comment)(caregivers) Do you feel safe going back to the place where you live?  Yes Need for family participation in patient care:  Yes (Comment)(has 24/7 caregivers)  Care giving concerns:  Pt is of advanced age, and has been sick for the past 2-3 weeks. Pt has some memory loss as evidenced by his answers to some basic questions regarding who he lives with.    Social Worker assessment / plan:  CSW acknowledging pt arrival to [facility] and recommendation for SNF. CSW met with pt and pt daughter at bedside. Pt states he lives at home but has supportive children and 24/7 caregivers. Pt had been to a SNF previously following a hospitalization. Neither pt nor pt daughter were pleased with SNF and are satisfied with the help that they have at home, would most likely want [facility] following discharge.   Employment status:  Retired Nurse, adult PT Recommendations:  [place], [place] / Referral to community resources:  [place]  Patient/Family's Response to care:  Pt was a little confused, liked to joke, asked CSW to "sing for his supper." Pt daughter aware of CSW role and recommendations, would like pt to go home rather than SNF.   Patient/Family's Understanding of and Emotional Response to Diagnosis, Current Treatment, and Prognosis:  Pt seemed to have some understanding of diagnosis, current treatment and prognosis. Pt states that he would not like to go to SNF. Pt daughter also aware of diagnosis, current treatment and prognosis. Pt and pt daughter stated no further concerns. CSW asked if pt or pt daughter had any further concerns, they did not. CSW signing off please reconsult if any other needs arise.   Emotional Assessment Appearance:  Appears stated age Attitude/Demeanor/Rapport:  Engaged, Charismatic Affect (typically observed):  Accepting, Appropriate, Pleasant, Happy Orientation:  Oriented to Self, Fluctuating Orientation (Suspected and/or reported Sundowners) Alcohol / Substance use:  Not Applicable Psych involvement (Current and /or in the community):  No (Comment)  Discharge Needs  Concerns to be addressed:  Care Coordination, Discharge Planning Concerns Readmission within the last 30 days:    Current discharge risk:  Cognitively Impaired, Dependent with Mobility Barriers to Discharge:  Continued Medical Work up   [employer], [place] 03/05/2018, 4:47 PM

## 2018-03-05 NOTE — ED Notes (Signed)
Heart Healthy Diet was ordered for Lunch. 

## 2018-03-05 NOTE — Progress Notes (Signed)
Pt was assessed with daughter arriving in last portion of eval and agreed that pt is more weak but has been having diarrhea and more weak lately.  Lives alone and asking for SNF to restore his mobility to a more reasonable safety for home.  Daughter has a sick husband and cannot stay with pt at home, but will be helpful to him.   03/05/18 1100  PT Visit Information  Last PT Received On 03/05/18  Assistance Needed +1  History of Present Illness 84 yo male with onset of CAP was admitted, has elevated lactic acid and recent major diarrhea, on enteric precautions for now.  PMHx:  CKD, CAD, CABG, HTN, AAA, PNA, TIA, ischemic cardiomyopathy  Subjective Data  Patient Stated Goal to get home and feel better  Precautions  Precautions Fall (on O2 3L)  Precaution Comments ck O2 sats  Restrictions  Weight Bearing Restrictions No  Pain Assessment  Pain Assessment No/denies pain  Cognition  Arousal/Alertness Awake/alert  Behavior During Therapy WFL for tasks assessed/performed  Overall Cognitive Status Difficult to assess  Difficult to assess due to Hard of hearing/deaf (slow to respond to verbal cues)  Bed Mobility  Overal bed mobility Needs Assistance  Bed Mobility Supine to Sit  Supine to sit Mod assist  General bed mobility comments requires both physical assist and cues for his mobility  Transfers  Overall transfer level Needs assistance  Equipment used Rolling walker (2 wheeled);1 person hand held assist  Transfers Sit to/from Stand  Sit to Stand Min assist  General transfer comment reminders for hand placement every trial  Ambulation/Gait  Ambulation/Gait assistance Min assist;Mod assist (occas really have to steady him)  Ambulation Distance (Feet) 45 Feet  Assistive device Rolling walker (2 wheeled)  Gait Pattern/deviations Step-through pattern;Decreased stride length;Wide base of support;Drifts right/left;Trunk flexed  General Gait Details pt is slightly buckled in appearance at  knees and lists to the side at times, no regard for the lines of telemetry   Gait velocity reduced  Gait velocity interpretation <1.8 ft/sec, indicate of risk for recurrent falls  Balance  Overall balance assessment Needs assistance  Sitting-balance support Feet supported  Sitting balance-Leahy Scale Good  Standing balance support Bilateral upper extremity supported;During functional activity  Standing balance-Leahy Scale Poor  Exercises  Exercises Other exercises (LE strength was grossly WFL x hips and hams 4-)  PT - End of Session  Equipment Utilized During Treatment Gait belt;Oxygen  Activity Tolerance Patient limited by fatigue;Other (comment) (imbalance and weakness)  Patient left in chair;with call bell/phone within reach;with family/visitor present;with chair alarm set  Nurse Communication Mobility status   PT - Assessment/Plan  PT Visit Diagnosis Unsteadiness on feet (R26.81);Muscle weakness (generalized) (M62.81);Difficulty in walking, not elsewhere classified (R26.2)  PT Frequency (ACUTE ONLY) Min 2X/week  Follow Up Recommendations SNF  PT equipment Rolling walker with 5" wheels  AM-PAC PT "6 Clicks" Daily Activity Outcome Measure  Difficulty turning over in bed (including adjusting bedclothes, sheets and blankets)? 3  Difficulty moving from lying on back to sitting on the side of the bed?  1  Difficulty sitting down on and standing up from a chair with arms (e.g., wheelchair, bedside commode, etc,.)? 1  Help needed moving to and from a bed to chair (including a wheelchair)? 3  Help needed walking in hospital room? 2  Help needed climbing 3-5 steps with a railing?  1  6 Click Score 11  Mobility G Code  CL  Acute Rehab PT Goals  PT  Goal Formulation With patient  Time For Goal Achievement 03/19/18  Potential to Achieve Goals Good  PT Time Calculation  PT Start Time (ACUTE ONLY) 0856  PT Stop Time (ACUTE ONLY) 1586  PT Time Calculation (min) (ACUTE ONLY) 42 min  PT  G-Codes **NOT FOR INPATIENT CLASS**  Functional Assessment Tool Used AM-PAC 6 Clicks Basic Mobility  PT General Charges  $$ ACUTE PT VISIT 1 Visit  PT Evaluation  $PT Eval Moderate Complexity 1 Mod  PT Treatments  $Gait Training 8-22 mins  $Therapeutic Exercise 8-22 mins    Mee Hives, PT MS Acute Rehab Dept. Number: [place] and [place]

## 2018-03-05 NOTE — ED Notes (Signed)
Pt back from US

## 2018-03-05 NOTE — Progress Notes (Signed)
PROGRESS NOTE    Bruce Green  RUE:[id number] DOB: 1918/12/24 DOA: 03/04/2018 PCP: Bruce Lass, MD   Brief Narrative:  this is a 84 y/o male who has not been feeling well since 2-3 weeks. Dizzy, unbalanced.  Normally moves well. Also has SOB. He has a wet sounding cough. He reports a wheeze. He reports chills and chills.  He has been having some diarrhea there last few weeks. hes had some nausea and vomiting. He has a good appetite. He's been to see his PCP and diagnosed with a virus, eventually a Zpac was prescribed, the patient didn't improve significantly with the Z Pack. He's not able to lay flat. He was brought to the EE. History provided by the patients daughter and caregiver. Bruce Green [phone] daughter POA.       Assessment & Plan:   Active Problems:   CAD (coronary artery disease)   Community acquired pneumonia   Thyroid nodule   Dyslipidemia   CAP (community acquired pneumonia)  1-PNA;  Fail out patient treatment.  Continue with nebulizer, solumedrol.  IV vancomycin and cefepime.   2-Possible PE;  Doppler ordered.  Continue with eliquis.  Pulmonary consulted. Will follow their recommendations regarding treatment for PE. Should we increase eliquis ? Is this a PE. ?   3-Gastroenteritis;  No BM today.   Low TSH; check free T 3 and T 4.   CAD; on metoprolol/   A fib; eliquis, amiodarone.   Thyroid nodule. Follow up out patient.   Orthostatic hypotension.  Continue with midodrine.      DVT prophylaxis: eliquis.  Code Status:  Full code.  Family Communication: Daughters at bedside.  Disposition Plan: family would want home with Bruce Green   Consultants:   Pulmonology    Procedures:  Thyroid US; 2.4 cm thyroid nodule.    Antimicrobials:  Vancomycin and cefepime.    Subjective: He is breathing better than on admission. Still with some cough.  Daughter report he was bluish color. In the hospital his oxygen sat has been normal.    Objective: Vitals:   03/05/18 1100 03/05/18 1200 03/05/18 1300 03/05/18 1500  BP: (!) 116/97 (!) 140/59 139/65 114/62  Pulse:  73 66 83  Resp:   20 18  Temp:      TempSrc:      SpO2:  92% 90% 94%  Weight:      Height:        Intake/Output Summary (Last 24 hours) at 03/05/2018 1538 Last data filed at 03/05/2018 0223 Gross per 24 hour  Intake 600 ml  Output -  Net 600 ml   Filed Weights   03/04/18 2053  Weight: 70.8 kg (156 lb)    Examination:  General exam: Appears calm and comfortable  Respiratory system: bilateral ronchus and wheezing. Marland Kitchen Respiratory effort normal. Cardiovascular system: S1 & S2 heard, RRR. No JVD, murmurs, rubs, gallops or clicks. No pedal edema. Gastrointestinal system: Abdomen is nondistended, soft and nontender. No organomegaly or masses felt. Normal bowel sounds heard. Central nervous system: Alert and oriented. No focal neurological deficits. Extremities: Symmetric 5 x 5 power. Skin: No rashes, lesions or ulcers   Data Reviewed: I have personally reviewed following labs and imaging studies  CBC: Recent Labs  Lab 03/04/18 1200 03/05/18 0615  WBC 6.4 8.1  HGB 14.5 12.2*  HCT 45.8 38.9*  MCV 91.6 90.0  PLT 151 308   Basic Metabolic Panel: Recent Labs  Lab 03/04/18 1200 03/05/18 0615  NA 142 141  K 3.6 3.8  CL 109 110  CO2 22 21*  GLUCOSE 112* 193*  BUN 23* 23*  CREATININE 1.36* 1.31*  CALCIUM 8.3* 8.0*   GFR: Estimated Creatinine Clearance: 33 mL/min (A) (by C-G formula based on SCr of 1.31 mg/dL (H)). Liver Function Tests: No results for input(s): AST, ALT, ALKPHOS, BILITOT, PROT, ALBUMIN in the last 168 hours. No results for input(s): LIPASE, AMYLASE in the last 168 hours. No results for input(s): AMMONIA in the last 168 hours. Coagulation Profile: Recent Labs  Lab 03/05/18 0615  INR 1.38   Cardiac Enzymes: No results for input(s): CKTOTAL, CKMB, CKMBINDEX, TROPONINI in the last 168 hours. BNP (last 3 results) No  results for input(s): PROBNP in the last 8760 hours. HbA1C: No results for input(s): HGBA1C in the last 72 hours. CBG: No results for input(s): GLUCAP in the last 168 hours. Lipid Profile: No results for input(s): CHOL, HDL, LDLCALC, TRIG, CHOLHDL, LDLDIRECT in the last 72 hours. Thyroid Function Tests: Recent Labs    03/05/18 0615  TSH 0.234*   Anemia Panel: No results for input(s): VITAMINB12, FOLATE, FERRITIN, TIBC, IRON, RETICCTPCT in the last 72 hours. Sepsis Labs: Recent Labs  Lab 03/04/18 2127  LATICACIDVEN 2.11*    Recent Results (from the past 240 hour(s))  Blood culture (routine x 2)     Status: None (Preliminary result)   Collection Time: 03/04/18  9:10 PM  Result Value Ref Range Status   Specimen Description BLOOD RIGHT HAND  Final   Special Requests   Final    BOTTLES DRAWN AEROBIC ONLY Blood Culture results may not be optimal due to an inadequate volume of blood received in culture bottles   Culture   Final    NO GROWTH < 12 HOURS Performed at [facility] [address], [place], [place] [address]    Report Status PENDING  Incomplete  Blood culture (routine x 2)     Status: None (Preliminary result)   Collection Time: 03/04/18  9:18 PM  Result Value Ref Range Status   Specimen Description BLOOD RIGHT WRIST  Final   Special Requests   Final    BOTTLES DRAWN AEROBIC ONLY Blood Culture results may not be optimal due to an inadequate volume of blood received in culture bottles   Culture   Final    NO GROWTH < 12 HOURS Performed at [facility], [place] [address], [place], [place] [address]    Report Status PENDING  Incomplete         Radiology Studies: Dg Chest 2 View  Result Date: 03/04/2018 CLINICAL DATA:  Short of breath. EXAM: CHEST - 2 VIEW COMPARISON:  03/02/2018 FINDINGS: There stable changes from prior CABG surgery. Cardiac silhouette is mildly enlarged. No mediastinal or hilar masses.  No evidence of adenopathy. Lungs are mildly  hyperexpanded. There coarse reticular opacities in the bases, left greater than right, consistent with fibrosis. No evidence of pneumonia or pulmonary edema. Small nodule suggested in the right upper lobe on the prior exam is not appreciated on the current exam. No pleural effusion or pneumothorax. Skeletal structures are demineralized but intact. IMPRESSION: 1. No acute cardiopulmonary disease. 2. Mild stable cardiomegaly and stable changes from prior CABG surgery. 3. Mild lung hyperexpansion. Left greater than right lung base fibrosis. Electronically Signed   By: Lajean Manes M.D.   On: 03/04/2018 13:04   Ct Angio Chest Pe W And/or Wo Contrast  Addendum Date: 03/04/2018   ADDENDUM REPORT: 03/04/2018  19:14 ADDENDUM: Critical Value/emergent results were called by telephone at the time of interpretation on 03/04/2018 at 7:10 pm to Dr. Jola Schmidt , who verbally acknowledged these results. Electronically Signed   By: Elon Alas M.D.   On: 03/04/2018 19:14   Result Date: 03/04/2018 CLINICAL DATA:  Dyspnea for a few months. History of pneumonia, cardiomyopathy. EXAM: CT ANGIOGRAPHY CHEST WITH CONTRAST TECHNIQUE: Multidetector CT imaging of the chest was performed using the standard protocol during bolus administration of intravenous contrast. Multiplanar CT image reconstructions and MIPs were obtained to evaluate the vascular anatomy. CONTRAST:  58mL ISOVUE-370 IOPAMIDOL (ISOVUE-370) INJECTION 76% COMPARISON:  Chest radiograph March 04, 2018 and CT chest May 21, 2014 FINDINGS: CARDIOVASCULAR: Adequate contrast opacification of the pulmonary artery's. Main pulmonary artery is not enlarged. Mild respiratory motion with faint central hypodensity LEFT lower lobe segmental branch (axial 234/425). The heart is moderately enlarged. Status post median sternotomy for CABG. Severe coronary artery calcifications. Thoracic aorta is 3.6 cm in transaxial dimension, stable from prior imaging. MEDIASTINUM/NODES: No  lymphadenopathy by CT size criteria. LUNGS/PLEURA: Tracheobronchial tree is patent, no pneumothorax. Diffuse moderate bronchial wall thickening. Patchy bilateral lower consolidation. 3 mm calcified granuloma RIGHT upper lobe. Punctate calcifications LEFT lower lobe. Small RIGHT pleural effusion. UPPER ABDOMEN: Small to moderate hiatal hernia. Status post cholecystectomy. 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 MUSCULOSKELETAL: Severe glenohumeral osteoarthrosis. Healing new LEFT posterior seventh rib fracture. Review of the MIP images confirms the above findings. IMPRESSION: 1. Nonocclusive LEFT lower lobe segmental artery pulmonary embolus versus artifact. 2. Bronchial wall thickening increased from prior examination most compatible with bronchitis. Patchy bibasilar atelectasis versus pneumonia. 3. Healing subacute LEFT posterior seventh rib fracture. 4. **An incidental finding of potential clinical significance has been found. 16 mm LEFT thyroid nodule. Recommend nonemergent thyroid sonogram. This follows ACR consensus guidelines: Managing Incidental Thyroid Nodules Detected on Imaging: White Paper of the ACR Incidental Thyroid Findings Committee. J Am Coll Radiol 2015; 12:143-150.** Aortic Atherosclerosis (ICD10-I70.0). Electronically Signed: By: Elon Alas M.D. On: 03/04/2018 19:00   US Thyroid  Result Date: 03/05/2018 CLINICAL DATA:  Incidental on CT. 84 year old male with a multinodular thyroid gland noted on recent CT imaging. EXAM: THYROID ULTRASOUND TECHNIQUE: Ultrasound examination of the thyroid gland and adjacent soft tissues was performed. COMPARISON:  CTA of the chest 03/04/2018 FINDINGS: Parenchymal Echotexture: Moderately heterogenous Isthmus: 0.3 cm Right lobe: 5.2 x 2.1 x 1.7 cm Left lobe: 4.7 x 2.0 x 1.4 cm _________________________________________________________ Estimated total number of nodules >/= 1 cm: 2 Number of spongiform nodules >/=  2 cm not described below  (TR1): 0 Number of mixed cystic and solid nodules >/= 1.5 cm not described below (TR2): 0 _________________________________________________________ Nodule # 1 (right): Location: Right; Mid Maximum size: 1.0 cm; Other 2 dimensions: 0.8 x 0.9 cm Composition: solid/almost completely solid (2) Echogenicity: hypoechoic (2) Shape: not taller-than-wide (0) Margins: smooth (0) Echogenic foci: none (0) ACR TI-RADS total points: 4. ACR TI-RADS risk category: TR4 (4-6 points). ACR TI-RADS recommendations: *Given size (>/= 1 - 1.4 cm) and appearance, a follow-up ultrasound in 1 year should be considered based on TI-RADS criteria. _________________________________________________________ Nodule # 1 (left): Location: Left; Inferior Maximum size: 2.4 cm; Other 2 dimensions: 1.4 x 1.4 cm Composition: solid/almost completely solid (2) Echogenicity: hypoechoic (2) Shape: not taller-than-wide (0) Margins: ill-defined (0) Echogenic foci: none (0) ACR TI-RADS total points: 4. ACR TI-RADS risk category: TR4 (4-6 points). ACR TI-RADS recommendations: **Given size (>/= 1.5 cm) and appearance, fine needle  aspiration of this moderately suspicious nodule should be considered based on TI-RADS criteria. _________________________________________________________ Numerous additional small thyroid cysts and nodules are scattered throughout the gland. These do not meet criteria for further evaluation. IMPRESSION: 1. The 2.4 cm TI-RADS category 4 nodule in the left inferior gland technically meets criteria for consideration of fine-needle aspiration biopsy. However, given patient demographics, if they would not be considered a surgical candidate for thyroidectomy, the utility of biopsy is of uncertain clinical benefit and continued surveillance (clinical or with follow-up ultrasound at 1 year) could be considered. 2. The 1.0 cm TI-RADS category 4 nodule in the right mid gland meets criteria for follow-up thyroid ultrasound in 1 year. The above is in  keeping with the ACR TI-RADS recommendations - J Am Coll Radiol 2017;14:587-595. Electronically Signed   By: Jacqulynn Cadet M.D.   On: 03/05/2018 07:47        Scheduled Meds: . amiodarone  200 mg Oral Daily  . apixaban  5 mg Oral BID  . atorvastatin  40 mg Oral Daily  . folic acid  1 mg Oral Daily  . ipratropium-albuterol  3 mL Nebulization Q6H  . loratadine  10 mg Oral Daily  . methylPREDNISolone (SOLU-MEDROL) injection  60 mg Intravenous Q8H  . midodrine  10 mg Oral TID WC  . pantoprazole  40 mg Oral Q breakfast  . tamsulosin  0.4 mg Oral QPC supper   Continuous Infusions: . ceFEPime (MAXIPIME) IV Stopped (03/04/18 2223)  . vancomycin       LOS: 1 day    Time spent: 35 minutes.     Elmarie Shiley, MD [facility] Pager [phone]  If 7PM-7AM, please contact night-coverage [email or web address] Password [facility] 03/05/2018, 3:38 PM

## 2018-03-06 DIAGNOSIS — J181 Lobar pneumonia, unspecified organism: Secondary | ICD-10-CM

## 2018-03-06 LAB — BASIC METABOLIC PANEL
Anion gap: 8 (ref 5–15)
BUN: 22 mg/dL — AB (ref 6–20)
CHLORIDE: 107 mmol/L (ref 101–111)
CO2: 23 mmol/L (ref 22–32)
CREATININE: 1.15 mg/dL (ref 0.61–1.24)
Calcium: 8.1 mg/dL — ABNORMAL LOW (ref 8.9–10.3)
GFR calc Af Amer: 60 mL/min — ABNORMAL LOW (ref >60)
GFR calc non Af Amer: 52 mL/min — ABNORMAL LOW (ref >60)
GLUCOSE: 168 mg/dL — AB (ref 65–99)
Potassium: 4 mmol/L (ref 3.5–5.1)
Sodium: 138 mmol/L (ref 135–145)

## 2018-03-06 LAB — CBC
HEMATOCRIT: 37.6 % — AB (ref 39.0–52.0)
HEMOGLOBIN: 12.2 g/dL — AB (ref 13.0–17.0)
MCH: 28.4 pg (ref 26.0–34.0)
MCHC: 32.4 g/dL (ref 30.0–36.0)
MCV: 87.6 fL (ref 78.0–100.0)
Platelets: 178 10*3/uL (ref 150–400)
RBC: 4.29 MIL/uL (ref 4.22–5.81)
RDW: 15.3 % (ref 11.5–15.5)
WBC: 10 10*3/uL (ref 4.0–10.5)

## 2018-03-06 LAB — BRAIN NATRIURETIC PEPTIDE: B Natriuretic Peptide: 751.4 pg/mL — ABNORMAL HIGH (ref 0.0–100.0)

## 2018-03-06 LAB — T4, FREE: Free T4: 1.72 ng/dL — ABNORMAL HIGH (ref 0.61–1.12)

## 2018-03-06 MED ORDER — GUAIFENESIN ER 600 MG PO TB12
600.0000 mg | ORAL_TABLET | Freq: Two times a day (BID) | ORAL | Status: DC
  Administered 2018-03-06 – 2018-03-08 (×5): 600 mg via ORAL
  Filled 2018-03-06 (×5): qty 1

## 2018-03-06 MED ORDER — PANTOPRAZOLE SODIUM 40 MG PO TBEC
40.0000 mg | DELAYED_RELEASE_TABLET | Freq: Two times a day (BID) | ORAL | Status: DC
  Administered 2018-03-06 – 2018-03-08 (×4): 40 mg via ORAL
  Filled 2018-03-06 (×4): qty 1

## 2018-03-06 MED ORDER — METHYLPREDNISOLONE SODIUM SUCC 40 MG IJ SOLR
40.0000 mg | Freq: Two times a day (BID) | INTRAMUSCULAR | Status: DC
  Administered 2018-03-06 – 2018-03-07 (×2): 40 mg via INTRAVENOUS
  Filled 2018-03-06 (×2): qty 1

## 2018-03-06 NOTE — Progress Notes (Addendum)
I was notified by radiology department that ordering physician for esophagram, needs to call radiology doctor for the procedure first before it's done; paged ordering NP, awaiting call back, will endorse.

## 2018-03-06 NOTE — Evaluation (Signed)
Clinical/Bedside Swallow Evaluation Patient Details  Name: Bruce Green MRN: [id number] Date of Birth: 26-Mar-1919  Today's Date: 03/06/2018 Time: SLP Start Time (ACUTE ONLY): 106 SLP Stop Time (ACUTE ONLY): 1428 SLP Time Calculation (min) (ACUTE ONLY): 18 min  Past Medical History:  Past Medical History:  Diagnosis Date  . Anxiety   . Cataract   . CKD (chronic kidney disease)   . Colon polyp   . Coronary artery disease 04/21/2014     severe triple vessel  . Dyslipidemia   . GERD (gastroesophageal reflux disease)   . Hiatal hernia    6cm  . Hypertension   . Ischemic cardiomyopathy 03/2014   EF 25-30%  . Pneumonia   . Stricture esophagus    distal  . TIA (transient ischemic attack)   . Vitamin D deficiency    Past Surgical History:  Past Surgical History:  Procedure Laterality Date  . APPENDECTOMY    . CARDIAC SURGERY    . CARDIOVERSION N/A 05/19/2017   Procedure: CARDIOVERSION;  Surgeon: Larey Dresser, MD;  Location: [facility] ENDOSCOPY;  Service: Cardiovascular;  Laterality: N/A;  . CHOLECYSTECTOMY    . CORONARY ARTERY BYPASS GRAFT N/A 04/24/2014   Procedure: CORONARY ARTERY BYPASS GRAFTING (CABG);  Surgeon: Gaye Pollack, MD;  Location: [facility];  Service: Open Heart Surgery;  Laterality: N/A;  Times 3 using left internal mammary artery and endoscopically harvested right saphenous vein  . ERCP N/A 11/13/2017   Procedure: ENDOSCOPIC RETROGRADE CHOLANGIOPANCREATOGRAPHY (ERCP);  Surgeon: Clarene Essex, MD;  Location: [facility]OS[facility];  Service: Endoscopy;  Laterality: N/A;  . ESOPHAGOGASTRODUODENOSCOPY  04/09/2012   Procedure: ESOPHAGOGASTRODUODENOSCOPY (EGD);  Surgeon: Gatha Mayer, MD;  Location: [facility] ENDOSCOPY;  Service: Endoscopy;  Laterality: N/A;  . INTRAOPERATIVE TRANSESOPHAGEAL ECHOCARDIOGRAM N/A 04/24/2014   Procedure: INTRAOPERATIVE TRANSESOPHAGEAL ECHOCARDIOGRAM;  Surgeon: Gaye Pollack, MD;  Location: [facility];  Service: Open Heart Surgery;  Laterality: N/A;  . LEFT AND RIGHT  HEART CATHETERIZATION WITH CORONARY ANGIOGRAM N/A 04/21/2014   Procedure: LEFT AND RIGHT HEART CATHETERIZATION WITH CORONARY ANGIOGRAM;  Surgeon: Burnell Blanks, MD;  Location: [facility] CATH LAB;  Service: Cardiovascular;  Laterality: N/A;  . SHOULDER SURGERY    . TEE WITHOUT CARDIOVERSION N/A 05/19/2017   Procedure: TRANSESOPHAGEAL ECHOCARDIOGRAM (TEE);  Surgeon: Larey Dresser, MD;  Location: [facility] ENDOSCOPY;  Service: Cardiovascular;  Laterality: N/A;   HPI:  Pt is a 84 y.o. male who has not been feeling well since 2-3 weeks. Dizzy, unbalanced.  Normally moves well. Also has SOB. He has a wet sounding cough. He reports a wheeze. He reports chills and chills.  He has been having some diarrhea there last few weeks. hes had some nausea and vomiting. He has a good appetite. He's been to see his PCP and diagnosed with a virus, eventually a Zpac was prescribed, the patient didn't improve significantly with the Z Pack. Chest angio showed bronchial wall thickening increased from prior examination most compatible with bronchitis. Patchy bibasilar atelectasis versus pneumonia. Hx esophageal stricture.    Assessment / Plan / Recommendation Clinical Impression  Pt did not show overt s/s of aspiration; however, some intermittent wheezing while consuming regular solid and pt eats/ drinks at an impulsive fast rate which may increase aspiration risk. Also suspect cognitive deficits as pt retold the same stories several times during evaluation. Reviewed importance of slow rate/ small bites and sips. Recommend continuing regular diet/ thin liquids, meds whole in liquid, supervision during meals to encourage slow rate, along with reflux  precautions- upright position 30-60 minutes after meal. SLP will f/u x1 to ensure diet tolerance/ review compensatory strategies. SLP Visit Diagnosis: Dysphagia, unspecified (R13.10)    Aspiration Risk  Mild aspiration risk;Moderate aspiration risk    Diet Recommendation Regular;Thin  liquid   Liquid Administration via: Cup;Straw Medication Administration: Whole meds with liquid Supervision: Patient able to self feed;Intermittent supervision to cue for compensatory strategies Compensations: Minimize environmental distractions;Slow rate;Small sips/bites Postural Changes: Seated upright at 90 degrees;Remain upright for at least 30 minutes after po intake    Other  Recommendations Oral Care Recommendations: Oral care BID   Follow up Recommendations Other (comment)(TBD)      Frequency and Duration min 1 x/week  1 week       Prognosis        Swallow Study   General HPI: Pt is a 84 y.o. male who has not been feeling well since 2-3 weeks. Dizzy, unbalanced.  Normally moves well. Also has SOB. He has a wet sounding cough. He reports a wheeze. He reports chills and chills.  He has been having some diarrhea there last few weeks. hes had some nausea and vomiting. He has a good appetite. He's been to see his PCP and diagnosed with a virus, eventually a Zpac was prescribed, the patient didn't improve significantly with the Z Pack. Chest angio showed bronchial wall thickening increased from prior examination most compatible with bronchitis. Patchy bibasilar atelectasis versus pneumonia. Hx esophageal stricture.  Type of Study: Bedside Swallow Evaluation Previous Swallow Assessment: none in chart Diet Prior to this Study: Regular;Thin liquids Temperature Spikes Noted: No Respiratory Status: Room air History of Recent Intubation: No Behavior/Cognition: Alert;Cooperative;Pleasant mood Oral Cavity Assessment: Within Functional Limits Oral Care Completed by SLP: No Oral Cavity - Dentition: Adequate natural dentition Vision: Functional for self-feeding Self-Feeding Abilities: Able to feed self Patient Positioning: Upright in bed Baseline Vocal Quality: Normal Volitional Cough: Strong    Oral/Motor/Sensory Function Overall Oral Motor/Sensory Function: Within functional limits    Ice Chips Ice chips: Not tested   Thin Liquid Thin Liquid: Within functional limits Presentation: Straw    Nectar Thick Nectar Thick Liquid: Not tested   Honey Thick Honey Thick Liquid: Not tested   Puree Puree: Not tested   Solid   GO   Solid: Impaired Presentation: Self Fed Pharyngeal Phase Impairments: Cough - Delayed        Kern Reap, MA, CCC-SLP 03/06/2018,2:31 PM  [phone]

## 2018-03-06 NOTE — Progress Notes (Addendum)
84 y/o male who has not been feeling well since 2-3 weeks. Dizzy, unbalanced. Normally moves well. Also has SOB. He has a wet sounding cough. He reports a wheeze. He reports chills and chills. He has been having some diarrhea there last few weeks. hes had some nausea and vomiting. He has a good appetite. He's been to see his PCP and diagnosed with a virus, eventually a Zpac was prescribed, the patient didn't improve significantly with the Z Pack. He's not able to lay flat.  A CT of chest was obtained and demonstrated a nonocclusive left lower lobe segment of the pulmonary artery which could not be identified as PE or possible artifact, which was the reason for pulmonary consult  Subjective Feels better Objective Blood Pressure (Abnormal) 156/73 (BP Location: Right Arm)   Pulse 65   Temperature 97.8 F (36.6 C) (Oral)   Respiration 18   Height 5\' 10"  (1.778 m)   Weight 154 lb 1.6 oz (69.9 kg)   Oxygen Saturation 96%   Body Mass Index 22.11 kg/m   Physical exam General: This is a very pleasant 84 year old vibrant male in no acute distress currently visiting with his daughter HEENT normocephalic atraumatic Pulmonary: Coarse scattered rhonchi, crackles bases, rhonchorous cough Cardiac: Regular rate and rhythm Abdomen: Soft nontender Extremities: Brisk cap refill no edema warm Neuro: Awake, somewhat confused but pleasant, no focal deficits  CBC Recent Labs    03/04/18 1200 03/05/18 0615 03/06/18 0400  WBC 6.4 8.1 10.0  HGB 14.5 12.2* 12.2*  HCT 45.8 38.9* 37.6*  PLT 151 153 178    Coag's Recent Labs    03/05/18 0615  INR 1.38    BMET Recent Labs    03/04/18 1200 03/05/18 0615 03/06/18 0400  NA 142 141 138  K 3.6 3.8 4.0  CL 109 110 107  CO2 22 21* 23  BUN 23* 23* 22*  CREATININE 1.36* 1.31* 1.15  GLUCOSE 112* 193* 168*    Electrolytes Recent Labs    03/04/18 1200 03/05/18 0615 03/06/18 0400  CALCIUM 8.3* 8.0* 8.1*    Sepsis Markers No results for  input(s): PROCALCITON, O2SATVEN in the last 72 hours.  Invalid input(s): LACTICACIDVEN  ABG No results for input(s): PHART, PCO2ART, PO2ART in the last 72 hours.  Liver Enzymes No results for input(s): AST, ALT, ALKPHOS, BILITOT, ALBUMIN in the last 72 hours.  Cardiac Enzymes No results for input(s): TROPONINI, PROBNP in the last 72 hours.  Glucose No results for input(s): GLUCAP in the last 72 hours.  Imaging Ct Angio Chest Pe W And/or Wo Contrast  Addendum Date: 03/04/2018   ADDENDUM REPORT: 03/04/2018 19:14 ADDENDUM: Critical Value/emergent results were called by telephone at the time of interpretation on 03/04/2018 at 7:10 pm to Dr. Jola Schmidt , who verbally acknowledged these results. Electronically Signed   By: Elon Alas M.D.   On: 03/04/2018 19:14   Result Date: 03/04/2018 CLINICAL DATA:  Dyspnea for a few months. History of pneumonia, cardiomyopathy. EXAM: CT ANGIOGRAPHY CHEST WITH CONTRAST TECHNIQUE: Multidetector CT imaging of the chest was performed using the standard protocol during bolus administration of intravenous contrast. Multiplanar CT image reconstructions and MIPs were obtained to evaluate the vascular anatomy. CONTRAST:  46mL ISOVUE-370 IOPAMIDOL (ISOVUE-370) INJECTION 76% COMPARISON:  Chest radiograph March 04, 2018 and CT chest May 21, 2014 FINDINGS: CARDIOVASCULAR: Adequate contrast opacification of the pulmonary artery's. Main pulmonary artery is not enlarged. Mild respiratory motion with faint central hypodensity LEFT lower lobe segmental branch (axial 234/425). The  heart is moderately enlarged. Status post median sternotomy for CABG. Severe coronary artery calcifications. Thoracic aorta is 3.6 cm in transaxial dimension, stable from prior imaging. MEDIASTINUM/NODES: No lymphadenopathy by CT size criteria. LUNGS/PLEURA: Tracheobronchial tree is patent, no pneumothorax. Diffuse moderate bronchial wall thickening. Patchy bilateral lower consolidation. 3 mm  calcified granuloma RIGHT upper lobe. Punctate calcifications LEFT lower lobe. Small RIGHT pleural effusion. UPPER ABDOMEN: Small to moderate hiatal hernia. Status post cholecystectomy. 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 MUSCULOSKELETAL: Severe glenohumeral osteoarthrosis. Healing new LEFT posterior seventh rib fracture. Review of the MIP images confirms the above findings. IMPRESSION: 1. Nonocclusive LEFT lower lobe segmental artery pulmonary embolus versus artifact. 2. Bronchial wall thickening increased from prior examination most compatible with bronchitis. Patchy bibasilar atelectasis versus pneumonia. 3. Healing subacute LEFT posterior seventh rib fracture. 4. **An incidental finding of potential clinical significance has been found. 16 mm LEFT thyroid nodule. Recommend nonemergent thyroid sonogram. This follows ACR consensus guidelines: Managing Incidental Thyroid Nodules Detected on Imaging: White Paper of the ACR Incidental Thyroid Findings Committee. J Am Coll Radiol 2015; 12:143-150.** Aortic Atherosclerosis (ICD10-I70.0). Electronically Signed: By: Elon Alas M.D. On: 03/04/2018 19:00   US Thyroid  Result Date: 03/05/2018 CLINICAL DATA:  Incidental on CT. 84 year old male with a multinodular thyroid gland noted on recent CT imaging. EXAM: THYROID ULTRASOUND TECHNIQUE: Ultrasound examination of the thyroid gland and adjacent soft tissues was performed. COMPARISON:  CTA of the chest 03/04/2018 FINDINGS: Parenchymal Echotexture: Moderately heterogenous Isthmus: 0.3 cm Right lobe: 5.2 x 2.1 x 1.7 cm Left lobe: 4.7 x 2.0 x 1.4 cm _________________________________________________________ Estimated total number of nodules >/= 1 cm: 2 Number of spongiform nodules >/=  2 cm not described below (TR1): 0 Number of mixed cystic and solid nodules >/= 1.5 cm not described below (TR2): 0 _________________________________________________________ Nodule # 1 (right): Location: Right; Mid  Maximum size: 1.0 cm; Other 2 dimensions: 0.8 x 0.9 cm Composition: solid/almost completely solid (2) Echogenicity: hypoechoic (2) Shape: not taller-than-wide (0) Margins: smooth (0) Echogenic foci: none (0) ACR TI-RADS total points: 4. ACR TI-RADS risk category: TR4 (4-6 points). ACR TI-RADS recommendations: *Given size (>/= 1 - 1.4 cm) and appearance, a follow-up ultrasound in 1 year should be considered based on TI-RADS criteria. _________________________________________________________ Nodule # 1 (left): Location: Left; Inferior Maximum size: 2.4 cm; Other 2 dimensions: 1.4 x 1.4 cm Composition: solid/almost completely solid (2) Echogenicity: hypoechoic (2) Shape: not taller-than-wide (0) Margins: ill-defined (0) Echogenic foci: none (0) ACR TI-RADS total points: 4. ACR TI-RADS risk category: TR4 (4-6 points). ACR TI-RADS recommendations: **Given size (>/= 1.5 cm) and appearance, fine needle aspiration of this moderately suspicious nodule should be considered based on TI-RADS criteria. _________________________________________________________ Numerous additional small thyroid cysts and nodules are scattered throughout the gland. These do not meet criteria for further evaluation. IMPRESSION: 1. The 2.4 cm TI-RADS category 4 nodule in the left inferior gland technically meets criteria for consideration of fine-needle aspiration biopsy. However, given patient demographics, if they would not be considered a surgical candidate for thyroidectomy, the utility of biopsy is of uncertain clinical benefit and continued surveillance (clinical or with follow-up ultrasound at 1 year) could be considered. 2. The 1.0 cm TI-RADS category 4 nodule in the right mid gland meets criteria for follow-up thyroid ultrasound in 1 year. The above is in keeping with the ACR TI-RADS recommendations - J Am Coll Radiol 2017;14:587-595. Electronically Signed   By: Jacqulynn Cadet M.D.   On: 03/05/2018 07:47  Impression/plan Pneumonia Gastroenteritis Coronary artery disease Atrial fibrillation Thyroid nodule Orthostatic hypotension  Acute dyspnea in the setting of bilateral pneumonia (no organism specified) given history of esophageal stricture I would favor likely aspiration either chronic, or nocturnal.  -Agree doubt deficit on CT scan reflects pulmonary emboli -Dopplers negative  Plan/rec Will order esophagram Taper steroids Could probably transition to Augmentin at any point Reflux precautions Wean oxygen No change in Eliquis   We will see again on 4/15  Erick Colace ACNP-BC [place] Pager # [phone] OR # [phone] if no answer

## 2018-03-06 NOTE — Progress Notes (Signed)
PROGRESS NOTE    Bruce Green  YKD:[id number] DOB: 1919-05-24 DOA: 03/04/2018 PCP: Kathyrn Lass, MD   Brief Narrative:  this is a 84 y/o male who has not been feeling well since 2-3 weeks. Dizzy, unbalanced.  Normally moves well. Also has SOB. He has a wet sounding cough. He reports a wheeze. He reports chills and chills.  He has been having some diarrhea there last few weeks. hes had some nausea and vomiting. He has a good appetite. He's been to see his PCP and diagnosed with a virus, eventually a Zpac was prescribed, the patient didn't improve significantly with the Z Pack. He's not able to lay flat. He was brought to the EE. History provided by the patients daughter and caregiver. [name] [phone] daughter POA.     Assessment & Plan:   Active Problems:   CAD (coronary artery disease)   Community acquired pneumonia   Thyroid nodule   Dyslipidemia   CAP (community acquired pneumonia)  1-PNA;  Fail out patient treatment.  Continue with nebulizer, solumedrol.  IV vancomycin and cefepime.  Will taper steroids.  Start guaifenesin.   2-Possible PE;  Doppler ordered. Negative for DVT Continue with eliquis.  Pulmonary consulted. Unlikely PE. Continue with current eliquis dose.   3-Gastroenteritis;  No BM today.  Resolve.  Low TSH; check free T 3 and T 4.   CAD; on metoprolol/   A fib; eliquis, amiodarone.   Thyroid nodule. Follow up out patient.   Orthostatic hypotension.  Continue with midodrine.   Low TSH; free t 4 mildly elevated. Follow free T 3. He received CT scan with contrast wonder if that has affected his TSH level. Will repeat TSH in am.     DVT prophylaxis: eliquis.  Code Status:  Full code.  Family Communication: Daughters at bedside.  Disposition Plan: family would want home with [facility]   Consultants:   Pulmonology    Procedures:  Thyroid US; 2.4 cm thyroid nodule.    Antimicrobials:  Vancomycin and cefepime.     Subjective: He is feeling better, dyspnea improved. Cough still thick phlegm.  He relates tremors for few days, has improved.   Objective: Vitals:   03/05/18 1300 03/05/18 1500 03/05/18 1737 03/06/18 0421  BP: 139/65 114/62 (!) 159/70 (!) 148/74  Pulse: 66 83 79 76  Resp: 20 18 16 17   Temp:   98.1 F (36.7 C) 98.2 F (36.8 C)  TempSrc:   Oral Oral  SpO2: 90% 94% 95% 96%  Weight:   69.9 kg (154 lb 1.6 oz)   Height:   5\' 10"  (1.778 m)     Intake/Output Summary (Last 24 hours) at 03/06/2018 1417 Last data filed at 03/06/2018 0438 Gross per 24 hour  Intake 540 ml  Output -  Net 540 ml   Filed Weights   03/04/18 2053 03/05/18 1737  Weight: 70.8 kg (156 lb) 69.9 kg (154 lb 1.6 oz)    Examination:  General exam: NAD Respiratory system: Respiratory effort normal, no wheezing.  Cardiovascular system: S 1, S 2 RRR Gastrointestinal system: BS present, soft, nt Central nervous system: non focal.  Extremities: symmetric power.  Skin: no rash   Data Reviewed: I have personally reviewed following labs and imaging studies  CBC: Recent Labs  Lab 03/04/18 1200 03/05/18 0615 03/06/18 0400  WBC 6.4 8.1 10.0  HGB 14.5 12.2* 12.2*  HCT 45.8 38.9* 37.6*  MCV 91.6 90.0 87.6  PLT 151 153 790   Basic Metabolic Panel:  Recent Labs  Lab 03/04/18 1200 03/05/18 0615 03/06/18 0400  NA 142 141 138  K 3.6 3.8 4.0  CL 109 110 107  CO2 22 21* 23  GLUCOSE 112* 193* 168*  BUN 23* 23* 22*  CREATININE 1.36* 1.31* 1.15  CALCIUM 8.3* 8.0* 8.1*   GFR: Estimated Creatinine Clearance: 37.1 mL/min (by C-G formula based on SCr of 1.15 mg/dL). Liver Function Tests: No results for input(s): AST, ALT, ALKPHOS, BILITOT, PROT, ALBUMIN in the last 168 hours. No results for input(s): LIPASE, AMYLASE in the last 168 hours. No results for input(s): AMMONIA in the last 168 hours. Coagulation Profile: Recent Labs  Lab 03/05/18 0615  INR 1.38   Cardiac Enzymes: No results for input(s):  CKTOTAL, CKMB, CKMBINDEX, TROPONINI in the last 168 hours. BNP (last 3 results) No results for input(s): PROBNP in the last 8760 hours. HbA1C: No results for input(s): HGBA1C in the last 72 hours. CBG: No results for input(s): GLUCAP in the last 168 hours. Lipid Profile: No results for input(s): CHOL, HDL, LDLCALC, TRIG, CHOLHDL, LDLDIRECT in the last 72 hours. Thyroid Function Tests: Recent Labs    03/05/18 0615 03/06/18 0400  TSH 0.234*  --   FREET4  --  1.72*   Anemia Panel: No results for input(s): VITAMINB12, FOLATE, FERRITIN, TIBC, IRON, RETICCTPCT in the last 72 hours. Sepsis Labs: Recent Labs  Lab 03/04/18 2127  LATICACIDVEN 2.11*    Recent Results (from the past 240 hour(s))  Blood culture (routine x 2)     Status: None (Preliminary result)   Collection Time: 03/04/18  9:10 PM  Result Value Ref Range Status   Specimen Description BLOOD RIGHT HAND  Final   Special Requests   Final    BOTTLES DRAWN AEROBIC ONLY Blood Culture results may not be optimal due to an inadequate volume of blood received in culture bottles   Culture   Final    NO GROWTH < 12 HOURS Performed at [facility] [address], [place], [place] [address]    Report Status PENDING  Incomplete  Blood culture (routine x 2)     Status: None (Preliminary result)   Collection Time: 03/04/18  9:18 PM  Result Value Ref Range Status   Specimen Description BLOOD RIGHT WRIST  Final   Special Requests   Final    BOTTLES DRAWN AEROBIC ONLY Blood Culture results may not be optimal due to an inadequate volume of blood received in culture bottles   Culture   Final    NO GROWTH < 12 HOURS Performed at [facility], [place] [address], [place], [place] [address]    Report Status PENDING  Incomplete         Radiology Studies: Ct Angio Chest Pe W And/or Wo Contrast  Addendum Date: 03/04/2018   ADDENDUM REPORT: 03/04/2018 19:14 ADDENDUM: Critical Value/emergent results were called by telephone  at the time of interpretation on 03/04/2018 at 7:10 pm to Dr. Jola Schmidt , who verbally acknowledged these results. Electronically Signed   By: Elon Alas M.D.   On: 03/04/2018 19:14   Result Date: 03/04/2018 CLINICAL DATA:  Dyspnea for a few months. History of pneumonia, cardiomyopathy. EXAM: CT ANGIOGRAPHY CHEST WITH CONTRAST TECHNIQUE: Multidetector CT imaging of the chest was performed using the standard protocol during bolus administration of intravenous contrast. Multiplanar CT image reconstructions and MIPs were obtained to evaluate the vascular anatomy. CONTRAST:  86mL ISOVUE-370 IOPAMIDOL (ISOVUE-370) INJECTION 76% COMPARISON:  Chest radiograph March 04, 2018 and CT chest May 21, 2014 FINDINGS: CARDIOVASCULAR: Adequate contrast opacification of the pulmonary artery's. Main pulmonary artery is not enlarged. Mild respiratory motion with faint central hypodensity LEFT lower lobe segmental branch (axial 234/425). The heart is moderately enlarged. Status post median sternotomy for CABG. Severe coronary artery calcifications. Thoracic aorta is 3.6 cm in transaxial dimension, stable from prior imaging. MEDIASTINUM/NODES: No lymphadenopathy by CT size criteria. LUNGS/PLEURA: Tracheobronchial tree is patent, no pneumothorax. Diffuse moderate bronchial wall thickening. Patchy bilateral lower consolidation. 3 mm calcified granuloma RIGHT upper lobe. Punctate calcifications LEFT lower lobe. Small RIGHT pleural effusion. UPPER ABDOMEN: Small to moderate hiatal hernia. Status post cholecystectomy. 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 MUSCULOSKELETAL: Severe glenohumeral osteoarthrosis. Healing new LEFT posterior seventh rib fracture. Review of the MIP images confirms the above findings. IMPRESSION: 1. Nonocclusive LEFT lower lobe segmental artery pulmonary embolus versus artifact. 2. Bronchial wall thickening increased from prior examination most compatible with bronchitis. Patchy  bibasilar atelectasis versus pneumonia. 3. Healing subacute LEFT posterior seventh rib fracture. 4. **An incidental finding of potential clinical significance has been found. 16 mm LEFT thyroid nodule. Recommend nonemergent thyroid sonogram. This follows ACR consensus guidelines: Managing Incidental Thyroid Nodules Detected on Imaging: White Paper of the ACR Incidental Thyroid Findings Committee. J Am Coll Radiol 2015; 12:143-150.** Aortic Atherosclerosis (ICD10-I70.0). Electronically Signed: By: Elon Alas M.D. On: 03/04/2018 19:00   US Thyroid  Result Date: 03/05/2018 CLINICAL DATA:  Incidental on CT. 84 year old male with a multinodular thyroid gland noted on recent CT imaging. EXAM: THYROID ULTRASOUND TECHNIQUE: Ultrasound examination of the thyroid gland and adjacent soft tissues was performed. COMPARISON:  CTA of the chest 03/04/2018 FINDINGS: Parenchymal Echotexture: Moderately heterogenous Isthmus: 0.3 cm Right lobe: 5.2 x 2.1 x 1.7 cm Left lobe: 4.7 x 2.0 x 1.4 cm _________________________________________________________ Estimated total number of nodules >/= 1 cm: 2 Number of spongiform nodules >/=  2 cm not described below (TR1): 0 Number of mixed cystic and solid nodules >/= 1.5 cm not described below (TR2): 0 _________________________________________________________ Nodule # 1 (right): Location: Right; Mid Maximum size: 1.0 cm; Other 2 dimensions: 0.8 x 0.9 cm Composition: solid/almost completely solid (2) Echogenicity: hypoechoic (2) Shape: not taller-than-wide (0) Margins: smooth (0) Echogenic foci: none (0) ACR TI-RADS total points: 4. ACR TI-RADS risk category: TR4 (4-6 points). ACR TI-RADS recommendations: *Given size (>/= 1 - 1.4 cm) and appearance, a follow-up ultrasound in 1 year should be considered based on TI-RADS criteria. _________________________________________________________ Nodule # 1 (left): Location: Left; Inferior Maximum size: 2.4 cm; Other 2 dimensions: 1.4 x 1.4 cm  Composition: solid/almost completely solid (2) Echogenicity: hypoechoic (2) Shape: not taller-than-wide (0) Margins: ill-defined (0) Echogenic foci: none (0) ACR TI-RADS total points: 4. ACR TI-RADS risk category: TR4 (4-6 points). ACR TI-RADS recommendations: **Given size (>/= 1.5 cm) and appearance, fine needle aspiration of this moderately suspicious nodule should be considered based on TI-RADS criteria. _________________________________________________________ Numerous additional small thyroid cysts and nodules are scattered throughout the gland. These do not meet criteria for further evaluation. IMPRESSION: 1. The 2.4 cm TI-RADS category 4 nodule in the left inferior gland technically meets criteria for consideration of fine-needle aspiration biopsy. However, given patient demographics, if they would not be considered a surgical candidate for thyroidectomy, the utility of biopsy is of uncertain clinical benefit and continued surveillance (clinical or with follow-up ultrasound at 1 year) could be considered. 2. The 1.0 cm TI-RADS category 4 nodule in the right mid gland meets criteria for follow-up  thyroid ultrasound in 1 year. The above is in keeping with the ACR TI-RADS recommendations - J Am Coll Radiol 2017;14:587-595. Electronically Signed   By: Jacqulynn Cadet M.D.   On: 03/05/2018 07:47        Scheduled Meds: . amiodarone  200 mg Oral Daily  . apixaban  5 mg Oral BID  . atorvastatin  40 mg Oral Daily  . folic acid  1 mg Oral Daily  . guaiFENesin  600 mg Oral BID  . ipratropium-albuterol  3 mL Nebulization TID  . loratadine  10 mg Oral Daily  . methylPREDNISolone (SOLU-MEDROL) injection  60 mg Intravenous Q8H  . midodrine  10 mg Oral TID WC  . pantoprazole  40 mg Oral BID  . tamsulosin  0.4 mg Oral QPC supper   Continuous Infusions: . ceFEPime (MAXIPIME) IV Stopped (03/05/18 2055)  . vancomycin Stopped (03/05/18 2320)     LOS: 2 days    Time spent: 35 minutes.     Elmarie Shiley, MD [facility] Pager [phone]  If 7PM-7AM, please contact night-coverage [email or web address] Password [email or web address] 03/06/2018, 2:17 PM

## 2018-03-06 NOTE — Progress Notes (Signed)
No call back from NP, paged Dr. Chase Caller and  notified him regarding the order and will be addressed in the morning.

## 2018-03-07 ENCOUNTER — Inpatient Hospital Stay (HOSPITAL_COMMUNITY): Payer: Medicare Other

## 2018-03-07 DIAGNOSIS — E041 Nontoxic single thyroid nodule: Secondary | ICD-10-CM

## 2018-03-07 DIAGNOSIS — K222 Esophageal obstruction: Secondary | ICD-10-CM

## 2018-03-07 LAB — BASIC METABOLIC PANEL
ANION GAP: 9 (ref 5–15)
BUN: 27 mg/dL — ABNORMAL HIGH (ref 6–20)
CALCIUM: 8.2 mg/dL — AB (ref 8.9–10.3)
CHLORIDE: 107 mmol/L (ref 101–111)
CO2: 23 mmol/L (ref 22–32)
Creatinine, Ser: 1.28 mg/dL — ABNORMAL HIGH (ref 0.61–1.24)
GFR calc non Af Amer: 45 mL/min — ABNORMAL LOW (ref >60)
GFR, EST AFRICAN AMERICAN: 53 mL/min — AB (ref >60)
Glucose, Bld: 151 mg/dL — ABNORMAL HIGH (ref 65–99)
POTASSIUM: 4.1 mmol/L (ref 3.5–5.1)
Sodium: 139 mmol/L (ref 135–145)

## 2018-03-07 LAB — TSH: TSH: 0.211 u[IU]/mL — ABNORMAL LOW (ref 0.350–4.500)

## 2018-03-07 MED ORDER — AMOXICILLIN-POT CLAVULANATE 875-125 MG PO TABS
1.0000 | ORAL_TABLET | Freq: Two times a day (BID) | ORAL | Status: DC
  Administered 2018-03-07 – 2018-03-08 (×3): 1 via ORAL
  Filled 2018-03-07 (×3): qty 1

## 2018-03-07 NOTE — Discharge Instructions (Addendum)
Information on my medicine - ELIQUIS (apixaban)  This medication education was reviewed with me or my healthcare representative as part of my discharge preparation.   Why was Eliquis prescribed for you? Eliquis was prescribed for you to reduce the risk of a blood clot forming that can cause a stroke if you have a medical condition called atrial fibrillation (a type of irregular heartbeat).  What do You need to know about Eliquis ? Take your Eliquis TWICE DAILY - one tablet in the morning and one tablet in the evening with or without food. If you have difficulty swallowing the tablet whole please discuss with your pharmacist how to take the medication safely.  Take Eliquis exactly as prescribed by your doctor and DO NOT stop taking Eliquis without talking to the doctor who prescribed the medication.  Stopping may increase your risk of developing a stroke.  Refill your prescription before you run out.     Please follow up with PCP, you will need referral to endocrinologist for further evaluation of thyroid nodule and abnormal TSH level.      After discharge, you should have regular check-up appointments with your healthcare provider that is prescribing your Eliquis.  In the future your dose may need to be changed if your kidney function or weight changes by a significant amount or as you get older.  What do you do if you miss a dose? If you miss a dose, take it as soon as you remember on the same day and resume taking twice daily.  Do not take more than one dose of ELIQUIS at the same time to make up a missed dose.  Important Safety Information A possible side effect of Eliquis is bleeding. You should call your healthcare provider right away if you experience any of the following: ? Bleeding from an injury or your nose that does not stop. ? Unusual colored urine (red or dark brown) or unusual colored stools (red or black). ? Unusual bruising for unknown reasons. ? A serious fall  or if you hit your head (even if there is no bleeding).  Some medicines may interact with Eliquis and might increase your risk of bleeding or clotting while on Eliquis. To help avoid this, consult your healthcare provider or pharmacist prior to using any new prescription or non-prescription medications, including herbals, vitamins, non-steroidal anti-inflammatory drugs (NSAIDs) and supplements.  This website has more information on Eliquis (apixaban): http://www.eliquis.com/eliquis/home

## 2018-03-07 NOTE — Progress Notes (Signed)
PROGRESS NOTE    Bruce Green  TDV:[id number] DOB: [birth year] DOA: 03/04/2018 PCP: Kathyrn Lass, MD   Brief Narrative:  this is a 84 y/o male who has not been feeling well since 2-3 weeks. Dizzy, unbalanced.  Normally moves well. Also has SOB. He has a wet sounding cough. He reports a wheeze. He reports chills and chills.  He has been having some diarrhea there last few weeks. hes had some nausea and vomiting. He has a good appetite. He's been to see his PCP and diagnosed with a virus, eventually a Zpac was prescribed, the patient didn't improve significantly with the Z Pack. He's not able to lay flat. He was brought to the EE. History provided by the patients daughter and caregiver. [name] [phone] daughter POA.   Assessment & Plan:   Active Problems:   CAD (coronary artery disease)   Community acquired pneumonia   Thyroid nodule   Dyslipidemia   CAP (community acquired pneumonia)  1-PNA; concern with aspiration PNA.  Fail out patient treatment.  Continue with nebulizer, solumedrol.  Treated with IV vancomycin and cefepime initially. Change antibiotics to augmenting.  Will taper steroids. Discontinue steroids.  Started  guaifenesin.  CCM recommend Esophagogram. Which showed moderate esophageal stricture at gastro esophageal junction. Will consult GI.   2-Esophageal Stricture; [facility] consulted for further evaluation.   3-Gastroenteritis;  No BM today.  Resolved.   Low TSH; check free T 3 and T 4.   CAD; on metoprolol/   A fib; eliquis, amiodarone.   Thyroid nodule. Follow up out patient.   Orthostatic hypotension.  Continue with midodrine.   Low TSH; free t 4 mildly elevated. Follow free T 3. He received CT scan with contrast wonder if that has affected his TSH level.  Repeated TSH 0.211 Free T 3 pending.   Possible PE;  Doppler ordered. Negative for DVT Continue with eliquis.  Pulmonary consulted. Unlikely PE. Continue with current eliquis dose.      DVT prophylaxis: eliquis.  Code Status:  Full code.  Family Communication: Daughters at bedside.  Disposition Plan: family would want home with [facility]   Consultants:   Pulmonology    Procedures:  Thyroid US; 2.4 cm thyroid nodule.    Antimicrobials:  Vancomycin and cefepime.    Subjective: He is sitting in recliner. Cough improved.  Breathing better.    Objective: Vitals:   03/07/18 0527 03/07/18 0831 03/07/18 1302 03/07/18 1510  BP: (!) 160/77  130/69   Pulse: 72  66   Resp: 18  17   Temp: 98.2 F (36.8 C)  97.9 F (36.6 C)   TempSrc: Oral  Oral   SpO2: 95% 96% 94% 95%  Weight:      Height:        Intake/Output Summary (Last 24 hours) at 03/07/2018 1621 Last data filed at 03/07/2018 1359 Gross per 24 hour  Intake 872 ml  Output 1400 ml  Net -528 ml   Filed Weights   03/04/18 2053 03/05/18 1737  Weight: 70.8 kg (156 lb) 69.9 kg (154 lb 1.6 oz)    Examination:  General exam: NAD Respiratory system: Normal respiratory effort. No wheezing.  Cardiovascular system: S 1, S 2 RRR Gastrointestinal system: BS present, soft, nt Central nervous system: non focal.  Extremities: symmetric power.  Skin: no rash   Data Reviewed: I have personally reviewed following labs and imaging studies  CBC: Recent Labs  Lab 03/04/18 1200 03/05/18 0615 03/06/18 0400  WBC 6.4 8.1  10.0  HGB 14.5 12.2* 12.2*  HCT 45.8 38.9* 37.6*  MCV 91.6 90.0 87.6  PLT 151 153 161   Basic Metabolic Panel: Recent Labs  Lab 03/04/18 1200 03/05/18 0615 03/06/18 0400 03/07/18 0649  NA 142 141 138 139  K 3.6 3.8 4.0 4.1  CL 109 110 107 107  CO2 22 21* 23 23  GLUCOSE 112* 193* 168* 151*  BUN 23* 23* 22* 27*  CREATININE 1.36* 1.31* 1.15 1.28*  CALCIUM 8.3* 8.0* 8.1* 8.2*   GFR: Estimated Creatinine Clearance: 33.4 mL/min (A) (by C-G formula based on SCr of 1.28 mg/dL (H)). Liver Function Tests: No results for input(s): AST, ALT, ALKPHOS, BILITOT, PROT, ALBUMIN in the last  168 hours. No results for input(s): LIPASE, AMYLASE in the last 168 hours. No results for input(s): AMMONIA in the last 168 hours. Coagulation Profile: Recent Labs  Lab 03/05/18 0615  INR 1.38   Cardiac Enzymes: No results for input(s): CKTOTAL, CKMB, CKMBINDEX, TROPONINI in the last 168 hours. BNP (last 3 results) No results for input(s): PROBNP in the last 8760 hours. HbA1C: No results for input(s): HGBA1C in the last 72 hours. CBG: No results for input(s): GLUCAP in the last 168 hours. Lipid Profile: No results for input(s): CHOL, HDL, LDLCALC, TRIG, CHOLHDL, LDLDIRECT in the last 72 hours. Thyroid Function Tests: Recent Labs    03/06/18 0400 03/07/18 0649  TSH  --  0.211*  FREET4 1.72*  --    Anemia Panel: No results for input(s): VITAMINB12, FOLATE, FERRITIN, TIBC, IRON, RETICCTPCT in the last 72 hours. Sepsis Labs: Recent Labs  Lab 03/04/18 2127  LATICACIDVEN 2.11*    Recent Results (from the past 240 hour(s))  Blood culture (routine x 2)     Status: None (Preliminary result)   Collection Time: 03/04/18  9:10 PM  Result Value Ref Range Status   Specimen Description BLOOD RIGHT HAND  Final   Special Requests   Final    BOTTLES DRAWN AEROBIC ONLY Blood Culture results may not be optimal due to an inadequate volume of blood received in culture bottles   Culture   Final    NO GROWTH 3 DAYS Performed at [facility], [place] [address], [place], [place] [address]    Report Status PENDING  Incomplete  Blood culture (routine x 2)     Status: None (Preliminary result)   Collection Time: 03/04/18  9:18 PM  Result Value Ref Range Status   Specimen Description BLOOD RIGHT WRIST  Final   Special Requests   Final    BOTTLES DRAWN AEROBIC ONLY Blood Culture results may not be optimal due to an inadequate volume of blood received in culture bottles   Culture   Final    NO GROWTH 3 DAYS Performed at [facility], [place] [address] [place], [place] [address]     Report Status PENDING  Incomplete         Radiology Studies: Dg Esophagus  Result Date: 03/07/2018 CLINICAL DATA:  Pneumonia. Possible aspiration. Previous esophageal stricture. EXAM: ESOPHOGRAM/BARIUM SWALLOW TECHNIQUE: Single contrast examination was performed using  thin barium. FLUOROSCOPY TIME:  Radiation Exposure Index (if provided by the fluoroscopic device): 11.9 mGy Number of Acquired Spot Images: 0 COMPARISON:  None. FINDINGS: Swallowing appears normal, with no evidence of vestibular penetration or aspiration. A small sliding hiatal hernia is seen. A moderate benign-appearing stricture is seen at the gastroesophageal junction. This prevents passage of a 13 mm barium tablet. No evidence of esophagitis  or mass. Gastroesophageal reflux was seen to the level of the midthoracic esophagus. Mild esophageal dysmotility is noted, with intermittent tertiary contractions. IMPRESSION: Moderate benign-appearing stricture at the gastroesophageal junction, which prevents passage of a 13 mm barium tablet. Small sliding hiatal hernia and gastroesophageal reflux. Mild esophageal dysmotility. No aspiration demonstrated. Electronically Signed   By: Earle Gell M.D.   On: 03/07/2018 15:05        Scheduled Meds: . amiodarone  200 mg Oral Daily  . amoxicillin-clavulanate  1 tablet Oral Q12H  . apixaban  5 mg Oral BID  . atorvastatin  40 mg Oral Daily  . folic acid  1 mg Oral Daily  . guaiFENesin  600 mg Oral BID  . ipratropium-albuterol  3 mL Nebulization TID  . loratadine  10 mg Oral Daily  . methylPREDNISolone (SOLU-MEDROL) injection  40 mg Intravenous Q12H  . midodrine  10 mg Oral TID WC  . pantoprazole  40 mg Oral BID  . tamsulosin  0.4 mg Oral QPC supper   Continuous Infusions:    LOS: 3 days    Time spent: 35 minutes.     Elmarie Shiley, MD [facility] Pager [phone]  If 7PM-7AM, please contact night-coverage [email or web address] Password [email or web address] 03/07/2018, 4:21 PM

## 2018-03-07 NOTE — Consult Note (Signed)
Referring Provider: Dr. Tyrell Antonio Primary Care Physician:  Kathyrn Lass, MD Primary Gastroenterologist:  Dr. Watt Climes  Reason for Consultation:  Esophageal Stricture  HPI: Bruce Green is a 84 y.o. male admitted for pneumonia concerning for possible aspiration pneumonia who was seen by speech path who recommended regular diet and thin liquids. No aspiration seen on their bedside swallowing evaluation. Barium swallow showed a moderate benign-appearing stricture at the GEJ that prevented passage of a 13 mm tablet. No report of melena. He adamantly denies any trouble swallowing food or liquids and denies coughing or gagging when he eats. His nurse reports that he ate about half of a solid tray lunch and tolerated without any coughing, gagging, or sensation that the food is not going down. EGD with dilation of a distal esophageal stricture (18 mm TTS balloon) in May 2013 by Dr. Carlean Purl when he had a food impaction. 6 cm hiatal hernia also noted. ERCP with sphincterotomy in 10/2017 by Dr. Watt Climes for CBD stones. On Eliquis.   Past Medical History:  Diagnosis Date  . Anxiety   . Cataract   . CKD (chronic kidney disease)   . Colon polyp   . Coronary artery disease 04/21/2014     severe triple vessel  . Dyslipidemia   . GERD (gastroesophageal reflux disease)   . Hiatal hernia    6cm  . Hypertension   . Ischemic cardiomyopathy 03/2014   EF 25-30%  . Pneumonia   . Stricture esophagus    distal  . TIA (transient ischemic attack)   . Vitamin D deficiency     Past Surgical History:  Procedure Laterality Date  . APPENDECTOMY    . CARDIAC SURGERY    . CARDIOVERSION N/A 05/19/2017   Procedure: CARDIOVERSION;  Surgeon: Larey Dresser, MD;  Location: [facility] ENDOSCOPY;  Service: Cardiovascular;  Laterality: N/A;  . CHOLECYSTECTOMY    . CORONARY ARTERY BYPASS GRAFT N/A 04/24/2014   Procedure: CORONARY ARTERY BYPASS GRAFTING (CABG);  Surgeon: Gaye Pollack, MD;  Location: [place];  Service: Open Heart  Surgery;  Laterality: N/A;  Times 3 using left internal mammary artery and endoscopically harvested right saphenous vein  . ERCP N/A 11/13/2017   Procedure: ENDOSCOPIC RETROGRADE CHOLANGIOPANCREATOGRAPHY (ERCP);  Surgeon: Clarene Essex, MD;  Location: [facility] ENDOSCOPY;  Service: Endoscopy;  Laterality: N/A;  . ESOPHAGOGASTRODUODENOSCOPY  04/09/2012   Procedure: ESOPHAGOGASTRODUODENOSCOPY (EGD);  Surgeon: Gatha Mayer, MD;  Location: [facility] ENDOSCOPY;  Service: Endoscopy;  Laterality: N/A;  . INTRAOPERATIVE TRANSESOPHAGEAL ECHOCARDIOGRAM N/A 04/24/2014   Procedure: INTRAOPERATIVE TRANSESOPHAGEAL ECHOCARDIOGRAM;  Surgeon: Gaye Pollack, MD;  Location: [place] OR;  Service: Open Heart Surgery;  Laterality: N/A;  . LEFT AND RIGHT HEART CATHETERIZATION WITH CORONARY ANGIOGRAM N/A 04/21/2014   Procedure: LEFT AND RIGHT HEART CATHETERIZATION WITH CORONARY ANGIOGRAM;  Surgeon: Burnell Blanks, MD;  Location: [facility] CATH LAB;  Service: Cardiovascular;  Laterality: N/A;  . SHOULDER SURGERY    . TEE WITHOUT CARDIOVERSION N/A 05/19/2017   Procedure: TRANSESOPHAGEAL ECHOCARDIOGRAM (TEE);  Surgeon: Larey Dresser, MD;  Location: [facility] ENDOSCOPY;  Service: Cardiovascular;  Laterality: N/A;    Prior to Admission medications   Medication Sig Start Date End Date Taking? Authorizing Provider  acetaminophen (TYLENOL) 500 MG tablet Take 500-1,000 mg by mouth 2 (two) times daily as needed for moderate pain.    Yes [provider]  amiodarone (PACERONE) 200 MG tablet Take 1 tablet (200 mg total) by mouth daily. 09/21/17  Yes Croitoru, Mihai, MD  atorvastatin (LIPITOR) 80 MG  tablet Take 40 mg by mouth daily.   Yes [provider]  azithromycin (ZITHROMAX) 250 MG tablet Take 250-500 mg by mouth See admin instructions. Take 500 mg by mouth on day 1, then take 250 mg once a day on days 2-5 03/03/18 03/07/18 Yes [provider]  B Complex-C (B-COMPLEX WITH VITAMIN C) tablet Take 1 tablet by mouth daily.   Yes  [provider]  Cholecalciferol (VITAMIN D3) 5000 units TABS Take 5,000 Units by mouth every Monday, Wednesday, and Friday.    Yes [provider]  ELIQUIS 2.5 MG TABS tablet TAKE ONE TABLET BY MOUTH TWICE A DAY Patient taking differently: Take 2.5 mg by mouth two times a day 12/30/17  Yes Croitoru, Mihai, MD  fluticasone (FLONASE) 50 MCG/ACT nasal spray Place 1 spray into both nostrils daily as needed for allergies or rhinitis.   Yes [provider]  folic acid (FOLVITE) 517 MCG tablet Take 400 mcg by mouth daily.   Yes [provider]  furosemide (LASIX) 40 MG tablet Take 0.5 tablets (20 mg total) by mouth daily as needed. FOR WT GAIN >3# IN 1 DAY  -OR- >5# IN A WEEK Patient taking differently: Take 20 mg by mouth daily as needed (for a weight gain of 3 pounds or more in a day or 5 pounds or more in a week).  08/04/17  Yes Barrett, Evelene Croon, PA-C  loratadine (CLARITIN) 10 MG tablet Take 10 mg by mouth daily.    Yes [provider]  Magnesium 400 MG CAPS Take 400 mg by mouth every other day. Patient taking differently: Take 400 mg by mouth every Monday, Wednesday, and Friday.  06/25/17  Yes Croitoru, Mihai, MD  Melatonin 5 MG TABS Take 5-10 mg by mouth at bedtime as needed (for sleep).    Yes [provider]  midodrine (PROAMATINE) 10 MG tablet Take 1 tablet (10 mg total) by mouth 3 (three) times daily with meals. 12/22/17  Yes Croitoru, Mihai, MD  pantoprazole (PROTONIX) 40 MG tablet TAKE 1 TABLET (40 MG TOTAL) BY MOUTH DAILY BEFORE BREAKFAST. Patient taking differently: Take 40 mg by mouth daily with breakfast.  08/28/17  Yes Croitoru, Mihai, MD  polyethylene glycol (MIRALAX / GLYCOLAX) packet Take 17 g by mouth daily as needed for mild constipation. 05/21/17  Yes Bonnielee Haff, MD  potassium chloride SA (K-DUR,KLOR-CON) 20 MEQ tablet Take 1 tablet (20 mEq total) by mouth daily. TAKE WHEN TAKING YOUR LASIX Patient taking differently: Take 20 mEq by  mouth daily as needed (only when taking Lasix for 3-5 lb. weight gain).  06/22/17  Yes Croitoru, Mihai, MD  PROAIR HFA 108 [phone] Base) MCG/ACT inhaler Inhale 2 puffs into the lungs every 4-6 hours as needed for shortness of breath or wheezing 03/02/18  Yes [provider]  Probiotic Product (PROBIOTIC PO) Take 1 capsule by mouth daily. 30 BILLION strength   Yes [provider]  tamsulosin (FLOMAX) 0.4 MG CAPS capsule Take 1 capsule (0.4 mg total) by mouth daily after supper. 05/21/17  Yes Bonnielee Haff, MD  Elastic Bandages & Supports (ABDOMINAL BINDER/ELASTIC MED) MISC Wear daily 02/12/18   Croitoru, Mihai, MD    Scheduled Meds: . amiodarone  200 mg Oral Daily  . amoxicillin-clavulanate  1 tablet Oral Q12H  . apixaban  5 mg Oral BID  . atorvastatin  40 mg Oral Daily  . folic acid  1 mg Oral Daily  . guaiFENesin  600 mg Oral BID  . ipratropium-albuterol  3 mL Nebulization TID  . loratadine  10 mg Oral Daily  . midodrine  10 mg Oral TID WC  . pantoprazole  40 mg Oral BID  . tamsulosin  0.4 mg Oral QPC supper   Continuous Infusions: PRN Meds:.ipratropium-albuterol, Melatonin, metoprolol tartrate, ondansetron **OR** ondansetron (ZOFRAN) IV, polyethylene glycol  Allergies as of 03/04/2018 - Review Complete 03/04/2018  Allergen Reaction Noted  . Other Other (See Comments) 10/27/2017  . Septra [sulfamethoxazole-trimethoprim] Hives 02/12/2018  . Tramadol Other (See Comments) 02/12/2018  . Sulfa antibiotics Rash 04/09/2012    Family History  Problem Relation Age of Onset  . Heart attack Mother   . Kidney disease Father   . Colon cancer Brother   . Dementia Brother   . Aneurysm Brother   . Breast cancer Sister        x 2  . Arthritis Sister   . Osteoporosis Sister     Social History   Socioeconomic History  . Marital status: Widowed    Spouse name: Not on file  . Number of children: 2  . Years of education: Not on file  . Highest education level: Not on file   Occupational History  . Occupation: retired  Scientific laboratory technician  . Financial resource strain: Not on file  . Food insecurity:    Worry: Not on file    Inability: Not on file  . Transportation needs:    Medical: Not on file    Non-medical: Not on file  Tobacco Use  . Smoking status: Never Smoker  . Smokeless tobacco: Never Used  Substance and Sexual Activity  . Alcohol use: Yes    Alcohol/week: 0.6 oz    Types: 1 Standard drinks or equivalent per week    Comment: rare  . Drug use: No  . Sexual activity: Not on file  Lifestyle  . Physical activity:    Days per week: Not on file    Minutes per session: Not on file  . Stress: Not on file  Relationships  . Social connections:    Talks on phone: Not on file    Gets together: Not on file    Attends religious service: Not on file    Active member of club or organization: Not on file    Attends meetings of clubs or organizations: Not on file    Relationship status: Not on file  . Intimate partner violence:    Fear of current or ex partner: Not on file    Emotionally abused: Not on file    Physically abused: Not on file    Forced sexual activity: Not on file  Other Topics Concern  . Not on file  Social History Narrative   Retired Conservation officer, historic buildings. Widowed. Was in [place] and [place]. He has 2 daughters and several grandchildren.        Review of Systems: All negative except as stated above in HPI.  Physical Exam: Vital signs: Vitals:   03/07/18 1302 03/07/18 1510  BP: 130/69   Pulse: 66   Resp: 17   Temp: 97.9 F (36.6 C)   SpO2: 94% 95%   Last BM Date: 03/03/18 General:   Alert, elderly, thin, pleasant and cooperative in NAD Head: normocephalic, atraumatic Eyes: anicteric sclera ENT: oropharynx clear Neck: supple, nontender Lungs:  Clear throughout to auscultation.   No wheezes, crackles, or rhonchi. No acute distress. Heart:  Regular rate and rhythm; no murmurs, clicks, rubs,  or gallops. Abdomen: soft, nontender,  nondistended, +BS  Rectal:  Deferred Ext: no edema  GI:  Lab Results: Recent Labs    03/05/18 0615 03/06/18 0400  WBC 8.1 10.0  HGB 12.2* 12.2*  HCT 38.9* 37.6*  PLT 153 178   BMET Recent Labs    03/05/18 0615 03/06/18 0400 03/07/18 0649  NA 141 138 139  K 3.8 4.0 4.1  CL 110 107 107  CO2 21* 23 23  GLUCOSE 193* 168* 151*  BUN 23* 22* 27*  CREATININE 1.31* 1.15 1.28*  CALCIUM 8.0* 8.1* 8.2*   LFT No results for input(s): PROT, ALBUMIN, AST, ALT, ALKPHOS, BILITOT, BILIDIR, IBILI in the last 72 hours. PT/INR Recent Labs    03/05/18 0615  LABPROT 16.8*  INR 1.38     Studies/Results: Dg Esophagus  Result Date: 03/07/2018 CLINICAL DATA:  Pneumonia. Possible aspiration. Previous esophageal stricture. EXAM: ESOPHOGRAM/BARIUM SWALLOW TECHNIQUE: Single contrast examination was performed using  thin barium. FLUOROSCOPY TIME:  Radiation Exposure Index (if provided by the fluoroscopic device): 11.9 mGy Number of Acquired Spot Images: 0 COMPARISON:  None. FINDINGS: Swallowing appears normal, with no evidence of vestibular penetration or aspiration. A small sliding hiatal hernia is seen. A moderate benign-appearing stricture is seen at the gastroesophageal junction. This prevents passage of a 13 mm barium tablet. No evidence of esophagitis or mass. Gastroesophageal reflux was seen to the level of the midthoracic esophagus. Mild esophageal dysmotility is noted, with intermittent tertiary contractions. IMPRESSION: Moderate benign-appearing stricture at the gastroesophageal junction, which prevents passage of a 13 mm barium tablet. Small sliding hiatal hernia and gastroesophageal reflux. Mild esophageal dysmotility. No aspiration demonstrated. Electronically Signed   By: Earle Gell M.D.   On: 03/07/2018 15:05    Impression/Plan: Esophageal stricture seen on barium swallow without any witnessed or reported difficulty swallowing food/liquids. Called his daughter, [name], who also  confirmed that he is eating fine at home without showing any signs of difficulty. No aspiration seen on bedside swallow. I think he has a benign stricture that is nonobstructing and would NOT recommend an EGD since he is eating ok due to his advanced age and comorbidities. He also has esophageal dysmotility due to age. Continue supportive care. If he continues to eat without difficulty, then would hold off on an EGD. Will sign off. Call if questions.    LOS: 3 days   [place] C.  03/07/2018, 5:40 PM  Pager [phone]  AFTER 5 pm or on weekends please call [phone]

## 2018-03-07 NOTE — Plan of Care (Deleted)
  Problem: Activity: Goal: Risk for activity intolerance will decrease Outcome: Progressing   Problem: Coping: Goal: Level of anxiety will decrease Outcome: Progressing   Problem: Elimination: Goal: Will not experience complications related to bowel motility Outcome: Progressing Goal: Will not experience complications related to urinary retention Outcome: Progressing   Problem: Clinical Measurements: Goal: Postoperative complications will be avoided or minimized Outcome: Progressing

## 2018-03-08 ENCOUNTER — Other Ambulatory Visit: Payer: Self-pay

## 2018-03-08 ENCOUNTER — Encounter (HOSPITAL_COMMUNITY): Payer: Self-pay | Admitting: General Practice

## 2018-03-08 DIAGNOSIS — R0902 Hypoxemia: Secondary | ICD-10-CM

## 2018-03-08 DIAGNOSIS — R1310 Dysphagia, unspecified: Secondary | ICD-10-CM

## 2018-03-08 DIAGNOSIS — J189 Pneumonia, unspecified organism: Secondary | ICD-10-CM

## 2018-03-08 LAB — T3, FREE: T3 FREE: 1.8 pg/mL — AB (ref 2.0–4.4)

## 2018-03-08 LAB — SPECIMEN STATUS REPORT

## 2018-03-08 LAB — HEPATIC FUNCTION PANEL
ALBUMIN: 4 g/dL (ref 3.2–4.6)
ALT: 28 IU/L (ref 0–44)
AST: 30 IU/L (ref 0–40)
Alkaline Phosphatase: 110 IU/L (ref 39–117)
Bilirubin Total: 0.3 mg/dL (ref 0.0–1.2)
Bilirubin, Direct: 0.16 mg/dL (ref 0.00–0.40)
Total Protein: 6.1 g/dL (ref 6.0–8.5)

## 2018-03-08 LAB — TSH: TSH: 3.73 u[IU]/mL (ref 0.450–4.500)

## 2018-03-08 MED ORDER — PANTOPRAZOLE SODIUM 40 MG PO TBEC: 40.0000 mg | DELAYED_RELEASE_TABLET | Freq: Two times a day (BID) | ORAL | 0 refills | Status: DC

## 2018-03-08 MED ORDER — GUAIFENESIN ER 600 MG PO TB12: 600.0000 mg | ORAL_TABLET | Freq: Two times a day (BID) | ORAL | 0 refills | Status: DC

## 2018-03-08 MED ORDER — PREDNISONE 20 MG PO TABS: ORAL_TABLET | ORAL | 0 refills | Status: DC

## 2018-03-08 MED ORDER — AMOXICILLIN-POT CLAVULANATE 875-125 MG PO TABS: 1.0000 | ORAL_TABLET | Freq: Two times a day (BID) | ORAL | 0 refills | Status: DC

## 2018-03-08 MED ORDER — POLYETHYLENE GLYCOL 3350 17 G PO PACK
17.0000 g | PACK | Freq: Once | ORAL | Status: AC
  Administered 2018-03-08: 17 g via ORAL
  Filled 2018-03-08: qty 1

## 2018-03-08 MED ORDER — IPRATROPIUM-ALBUTEROL 0.5-2.5 (3) MG/3ML IN SOLN: 3.0000 mL | Freq: Three times a day (TID) | RESPIRATORY_TRACT | 0 refills | Status: AC

## 2018-03-08 MED ORDER — BISACODYL 10 MG RE SUPP: 10.0000 mg | Freq: Once | RECTAL | Status: DC

## 2018-03-08 MED ORDER — APIXABAN 5 MG PO TABS: 5.0000 mg | ORAL_TABLET | Freq: Two times a day (BID) | ORAL | 0 refills | Status: DC

## 2018-03-08 MED ORDER — PREDNISONE 20 MG PO TABS
40.0000 mg | ORAL_TABLET | Freq: Every day | ORAL | Status: DC
  Administered 2018-03-08: 40 mg via ORAL
  Filled 2018-03-08: qty 2

## 2018-03-08 NOTE — Care Management Note (Addendum)
Case Management Note  Patient Details  Name: Bruce Green MRN: [id number] Date of Birth: 1919-07-17  Subjective/Objective:                    Action/Plan:Spoke with [name] at bedside. Ordered NEB machine through [facility]. Daughter would like [facility] for home health. Called Stanton Kidney with [facility] left message . Referral for home health PT,RN accepted by [employer] [facility] [employer].Ordered home oxygen through [facility] with patient and his daughter via phone [phone] . [name] is on her way to her dad's hospital room  and will discuss discharge plan  Expected Discharge Date:  03/08/18               Expected Discharge Plan:  [place]  In-House Referral:     Discharge planning Services  CM Consult  Post Acute Care Choice:    Choice offered to:  Patient, Adult Children  DME Arranged:    DME Agency:     HH Arranged:    [place] Agency:     Status of Service:  In process, will continue to follow  If discussed at Long Length of Stay Meetings, dates discussed:    Additional Comments:  Marilu Favre, RN 03/08/2018, 2:08 PM

## 2018-03-08 NOTE — Progress Notes (Signed)
84 y/o male who has not been feeling well since 2-3 weeks. Dizzy, unbalanced. Normally moves well. Also has SOB. He has a wet sounding cough. He reports a wheeze. He reports chills and chills. He has been having some diarrhea there last few weeks. hes had some nausea and vomiting. He has a good appetite. He's been to see his PCP and diagnosed with a virus, eventually a Zpac was prescribed, the patient didn't improve significantly with the Z Pack. He's not able to lay flat.  A CT of chest was obtained and demonstrated a nonocclusive left lower lobe segment of the pulmonary artery which could not be identified as PE or possible artifact, which was the reason for pulmonary consult  Subjective No events overnight, feels better, off O2 completely at this point  Objective BP 117/62 (BP Location: Right Arm)   Pulse 72   Temp 97.6 F (36.4 C) (Oral)   Resp 16   Ht 5\' 10"  (1.778 m)   Wt 154 lb 1.6 oz (69.9 kg)   SpO2 94%   BMI 22.11 kg/m   Physical exam General: 84 year old male sitting comfortably in exam bed in NAD HEENT: /AT, PERRL, EOM-I and MMM Pulmonary: Coarse BS diffusely Cardiac: RRR, Nl S1/S2 and -M/R/G Abdomen: Soft, NT, ND and +BS Extremities: Brisk cap refill no edema warm Neuro: Awake, somewhat confused but pleasant, no focal deficits  CBC Recent Labs    03/06/18 0400  WBC 10.0  HGB 12.2*  HCT 37.6*  PLT 178   Coag's No results for input(s): APTT, INR in the last 72 hours.  BMET Recent Labs    03/06/18 0400 03/07/18 0649  NA 138 139  K 4.0 4.1  CL 107 107  CO2 23 23  BUN 22* 27*  CREATININE 1.15 1.28*  GLUCOSE 168* 151*   Electrolytes Recent Labs    03/06/18 0400 03/07/18 0649  CALCIUM 8.1* 8.2*   Sepsis Markers No results for input(s): PROCALCITON, O2SATVEN in the last 72 hours.  Invalid input(s): LACTICACIDVEN  ABG No results for input(s): PHART, PCO2ART, PO2ART in the last 72 hours.  Liver Enzymes No results for input(s): AST, ALT,  ALKPHOS, BILITOT, ALBUMIN in the last 72 hours.  Cardiac Enzymes No results for input(s): TROPONINI, PROBNP in the last 72 hours.  Glucose No results for input(s): GLUCAP in the last 72 hours.  Imaging Dg Esophagus  Result Date: 03/07/2018 CLINICAL DATA:  Pneumonia. Possible aspiration. Previous esophageal stricture. EXAM: ESOPHOGRAM/BARIUM SWALLOW TECHNIQUE: Single contrast examination was performed using  thin barium. FLUOROSCOPY TIME:  Radiation Exposure Index (if provided by the fluoroscopic device): 11.9 mGy Number of Acquired Spot Images: 0 COMPARISON:  None. FINDINGS: Swallowing appears normal, with no evidence of vestibular penetration or aspiration. A small sliding hiatal hernia is seen. A moderate benign-appearing stricture is seen at the gastroesophageal junction. This prevents passage of a 13 mm barium tablet. No evidence of esophagitis or mass. Gastroesophageal reflux was seen to the level of the midthoracic esophagus. Mild esophageal dysmotility is noted, with intermittent tertiary contractions. IMPRESSION: Moderate benign-appearing stricture at the gastroesophageal junction, which prevents passage of a 13 mm barium tablet. Small sliding hiatal hernia and gastroesophageal reflux. Mild esophageal dysmotility. No aspiration demonstrated. Electronically Signed   By: Earle Gell M.D.   On: 03/07/2018 15:05   I reviewed esophageal gram myself, stricture noted but dye is moving down  I reviewed CXR myself, infiltrate noted  Impression/plan Pneumonia Gastroenteritis Coronary artery disease Atrial fibrillation Thyroid nodule Orthostatic  hypotension  Acute dyspnea in the setting of bilateral pneumonia (no organism specified) given history of esophageal stricture I would favor likely aspiration pneumonitis rather than a true pneumonia  PNA: as above doubt  - Check PCT, if negative then d/c abx  - If positive then finish a week course of augmentin  Hypoxemia: given CT findings,  doubt PE, dopplers negative  - Eliquis for a-fib  Pneumonitis:  - Steroid taper as ordered  Dysphagia:  - Aspiration precautions  Esophageal stricture  - GI evaluated, no interventions for now  PCCM will sign off, please call back if needed.  Rush Farmer, M.D. [facility] Pulmonary/Critical Care Medicine. Pager: [phone]. After hours pager: [phone].

## 2018-03-08 NOTE — Progress Notes (Signed)
Pharmacist Heart Failure Core Measure Documentation  Assessment: Bruce Green has an EF documented as 25-30% on 05/16/2017 by ECHO.  Rationale: Heart failure patients with left ventricular systolic dysfunction (LVSD) and an EF < 40% should be prescribed an angiotensin converting enzyme inhibitor (ACEI) or angiotensin receptor blocker (ARB) at discharge unless a contraindication is documented in the medical record.  This patient is not currently on an ACEI or ARB for HF.  This note is being placed in the record in order to provide documentation that a contraindication to the use of these agents is present for this encounter.  ACE Inhibitor or Angiotensin Receptor Blocker is contraindicated (specify all that apply)  []   ACEI allergy AND ARB allergy []   Angioedema []   Moderate or severe aortic stenosis []   Hyperkalemia [x]   Hypotension - on midodrine []   Renal artery stenosis []   Worsening renal function, preexisting renal disease or dysfunction   [name] D. Mina Marble, PharmD, BCPS Pager:  [phone] 03/08/2018, 1:39 PM

## 2018-03-08 NOTE — Discharge Summary (Addendum)
Physician Discharge Summary  Bruce Green LZJ:[id number] DOB: 18-Aug-1919 DOA: 03/04/2018  PCP: Bruce Lass, MD  Admit date: 03/04/2018 Discharge date: 03/08/2018  Admitted From: Home  Disposition:  Home with [facility]  Recommendations for Outpatient Follow-up:  1. Follow up with PCP in 1-weeks 2. Please obtain BMP/CBC in one week 3. Please patient will need referral to endocrinologist due to abnormal Thyroid function test and thyroid nodule.  4. Needs to follow up with Bruce Green for further evaluation of stricture.  5. Continue prevention for aspiration PNA  Home Health: yes, HH, oxygen   Discharge Condition: Stable.  CODE STATUS; full code.  Diet recommendation: Heart Healthy  Brief/Interim Summary: Brief Narrative: this is a 84 y/o male who has not been feeling well since 2-3 weeks. Dizzy, unbalanced. Normally moves well. Also has SOB. He has a wet sounding cough. He reports a wheeze. He reports chills and chills. He has been having some diarrhea there last few weeks. hes had some nausea and vomiting. He has a good appetite. He's been to see his PCP and diagnosed with a virus, eventually a Zpac was prescribed, the patient didn't improve significantly with the Z Pack. He's not able to lay flat. He was brought to the EE. History provided by the patients daughter and caregiver. Bruce Green [phone] daughter POA.   Assessment & Plan:   Active Problems:   CAD (coronary artery disease)   Community acquired pneumonia   Thyroid nodule   Dyslipidemia   CAP (community acquired pneumonia)  1-PNA; concern with aspiration PNA.  Fail out patient treatment.  Continue with nebulizer, solumedrol.  Treated with IV vancomycin and cefepime initially. Change antibiotics to augmenting.  Will taper steroids. Discontinue steroids.  Started  guaifenesin.  CCM recommend Esophagogram. Which showed moderate esophageal stricture at gastro esophageal junction.  Green does not recommend endoscopy at  this time, patient is not having symptoms.  Aspiration precaution explain to patient, need to eat at least 3 hours before lying down and needs to sleep more than 30-45 degree head up.  he will be discharge on 5 days of Augmentin and prednisone taper.  Needs close follow up with Bruce Green.  Discharge on home oxygen.   2-Esophageal Stricture; Bruce Green consulted for further evaluation. no plan for endoscopy at this time.   3-Gastroenteritis;  No BM today.  Resolved.   CAD; on metoprolol/   A fib; eliquis, amiodarone.   Thyroid nodule. Follow up out patient.   Orthostatic hypotension.  Continue with midodrine.   Low TSH; free t 4 mildly elevated. Follow free T 3. He received CT scan with contrast wonder if that has affected his TSH level.  Repeated TSH 0.211 Free T 3 low. He will need repeat function test and follow up with endocrinologist   Possible PE;  Doppler ordered. Negative for DVT Continue with eliquis.  Pulmonary consulted. Unlikely PE. Continue with current eliquis dose. 5 mg BID      Discharge Diagnoses:  Active Problems:   CAD (coronary artery disease)   Community acquired pneumonia   Thyroid nodule   Dyslipidemia   CAP (community acquired pneumonia)    Discharge Instructions  Discharge Instructions    Diet - low sodium heart healthy   Complete by:  As directed    Increase activity slowly   Complete by:  As directed      Allergies as of 03/08/2018      Reactions   Other Other (See Comments)   General anesthesia -  sensitivity,  causes severe lethargy    Septra [sulfamethoxazole-trimethoprim] Hives   Tramadol Other (See Comments)   Caused low b/p   Sulfa Antibiotics Rash      Medication List    STOP taking these medications   azithromycin 250 MG tablet Commonly known as:  ZITHROMAX     TAKE these medications   Abdominal Binder/Elastic Med Misc Wear daily   acetaminophen 500 MG tablet Commonly known as:  TYLENOL Take 500-1,000  mg by mouth 2 (two) times daily as needed for moderate pain.   amiodarone 200 MG tablet Commonly known as:  PACERONE Take 1 tablet (200 mg total) by mouth daily.   amoxicillin-clavulanate 875-125 MG tablet Commonly known as:  AUGMENTIN Take 1 tablet by mouth every 12 (twelve) hours.   apixaban 5 MG Tabs tablet Commonly known as:  ELIQUIS Take 1 tablet (5 mg total) by mouth 2 (two) times daily. What changed:    medication strength  how much to take   atorvastatin 80 MG tablet Commonly known as:  LIPITOR Take 40 mg by mouth daily.   B-complex with vitamin C tablet Take 1 tablet by mouth daily.   fluticasone 50 MCG/ACT nasal spray Commonly known as:  FLONASE Place 1 spray into both nostrils daily as needed for allergies or rhinitis.   folic acid 784 MCG tablet Commonly known as:  FOLVITE Take 400 mcg by mouth daily.   furosemide 40 MG tablet Commonly known as:  LASIX Take 0.5 tablets (20 mg total) by mouth daily as needed. FOR WT GAIN >3# IN 1 DAY  -OR- >5# IN A WEEK What changed:    reasons to take this  additional instructions   guaiFENesin 600 MG 12 hr tablet Commonly known as:  MUCINEX Take 1 tablet (600 mg total) by mouth 2 (two) times daily.   ipratropium-albuterol 0.5-2.5 (3) MG/3ML Soln Commonly known as:  DUONEB Take 3 mLs by nebulization 3 (three) times daily.   loratadine 10 MG tablet Commonly known as:  CLARITIN Take 10 mg by mouth daily.   Magnesium 400 MG Caps Take 400 mg by mouth every other day. What changed:  when to take this   Melatonin 5 MG Tabs Take 5-10 mg by mouth at bedtime as needed (for sleep).   midodrine 10 MG tablet Commonly known as:  PROAMATINE Take 1 tablet (10 mg total) by mouth 3 (three) times daily with meals.   pantoprazole 40 MG tablet Commonly known as:  PROTONIX Take 1 tablet (40 mg total) by mouth 2 (two) times daily. What changed:    how much to take  how to take this  when to take this  additional  instructions   polyethylene glycol packet Commonly known as:  MIRALAX / GLYCOLAX Take 17 g by mouth daily as needed for mild constipation.   potassium chloride SA 20 MEQ tablet Commonly known as:  K-DUR,KLOR-CON Take 1 tablet (20 mEq total) by mouth daily. TAKE WHEN TAKING YOUR LASIX What changed:    when to take this  reasons to take this  additional instructions   PROAIR HFA 108 (90 Base) MCG/ACT inhaler Generic drug:  albuterol Inhale 2 puffs into the lungs every 4-6 hours as needed for shortness of breath or wheezing   PROBIOTIC PO Take 1 capsule by mouth daily. 30 BILLION strength   tamsulosin 0.4 MG Caps capsule Commonly known as:  FLOMAX Take 1 capsule (0.4 mg total) by mouth daily after supper.   Vitamin D3 5000 units  Tabs Take 5,000 Units by mouth every Monday, Wednesday, and Friday.            Durable Medical Equipment  (From admission, onward)        Start     Ordered   03/08/18 1222  For home use only DME Nebulizer machine  Once    Question:  Patient needs a nebulizer to treat with the following condition  Answer:  Bronchitis   03/08/18 1221     Follow-up Information    Bruce Lass, MD.   Specialty:  [facility] Contact information: [place] [address] [phone]          Allergies  Allergen Reactions  . Other Other (See Comments)    General anesthesia - sensitivity,  causes severe lethargy   . Septra [Sulfamethoxazole-Trimethoprim] Hives  . Tramadol Other (See Comments)    Caused low b/p   . Sulfa Antibiotics Rash    Consultations:  CCM   Procedures/Studies: Dg Chest 2 View  Result Date: 03/04/2018 CLINICAL DATA:  Short of breath. EXAM: CHEST - 2 VIEW COMPARISON:  03/02/2018 FINDINGS: There stable changes from prior CABG surgery. Cardiac silhouette is mildly enlarged. No mediastinal or hilar masses.  No evidence of adenopathy. Lungs are mildly hyperexpanded. There coarse reticular opacities in the  bases, left greater than right, consistent with fibrosis. No evidence of pneumonia or pulmonary edema. Small nodule suggested in the right upper lobe on the prior exam is not appreciated on the current exam. No pleural effusion or pneumothorax. Skeletal structures are demineralized but intact. IMPRESSION: 1. No acute cardiopulmonary disease. 2. Mild stable cardiomegaly and stable changes from prior CABG surgery. 3. Mild lung hyperexpansion. Left greater than right lung base fibrosis. Electronically Signed   By: Lajean Manes M.D.   On: 03/04/2018 13:04   Ct Angio Chest Pe W And/or Wo Contrast  Addendum Date: 03/04/2018   ADDENDUM REPORT: 03/04/2018 19:14 ADDENDUM: Critical Value/emergent results were called by telephone at the time of interpretation on 03/04/2018 at 7:10 pm to Bruce. Jola Schmidt , who verbally acknowledged these results. Electronically Signed   By: Elon Alas M.D.   On: 03/04/2018 19:14   Result Date: 03/04/2018 CLINICAL DATA:  Dyspnea for a few months. History of pneumonia, cardiomyopathy. EXAM: CT ANGIOGRAPHY CHEST WITH CONTRAST TECHNIQUE: Multidetector CT imaging of the chest was performed using the standard protocol during bolus administration of intravenous contrast. Multiplanar CT image reconstructions and MIPs were obtained to evaluate the vascular anatomy. CONTRAST:  53mL ISOVUE-370 IOPAMIDOL (ISOVUE-370) INJECTION 76% COMPARISON:  Chest radiograph March 04, 2018 and CT chest May 21, 2014 FINDINGS: CARDIOVASCULAR: Adequate contrast opacification of the pulmonary artery's. Main pulmonary artery is not enlarged. Mild respiratory motion with faint central hypodensity LEFT lower lobe segmental branch (axial 234/425). The heart is moderately enlarged. Status post median sternotomy for CABG. Severe coronary artery calcifications. Thoracic aorta is 3.6 cm in transaxial dimension, stable from prior imaging. MEDIASTINUM/NODES: No lymphadenopathy by CT size criteria. LUNGS/PLEURA:  Tracheobronchial tree is patent, no pneumothorax. Diffuse moderate bronchial wall thickening. Patchy bilateral lower consolidation. 3 mm calcified granuloma RIGHT upper lobe. Punctate calcifications LEFT lower lobe. Small RIGHT pleural effusion. UPPER ABDOMEN: Small to moderate hiatal hernia. Status post cholecystectomy. 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 MUSCULOSKELETAL: Severe glenohumeral osteoarthrosis. Healing new LEFT posterior seventh rib fracture. Review of the MIP images confirms the above findings. IMPRESSION: 1. Nonocclusive LEFT lower lobe segmental artery pulmonary embolus versus artifact. 2. Bronchial  wall thickening increased from prior examination most compatible with bronchitis. Patchy bibasilar atelectasis versus pneumonia. 3. Healing subacute LEFT posterior seventh rib fracture. 4. **An incidental finding of potential clinical significance has been found. 16 mm LEFT thyroid nodule. Recommend nonemergent thyroid sonogram. This follows ACR consensus guidelines: Managing Incidental Thyroid Nodules Detected on Imaging: White Paper of the ACR Incidental Thyroid Findings Committee. J Am Coll Radiol 2015; 12:143-150.** Aortic Atherosclerosis (ICD10-I70.0). Electronically Signed: By: Elon Alas M.D. On: 03/04/2018 19:00   Dg Esophagus  Result Date: 03/07/2018 CLINICAL DATA:  Pneumonia. Possible aspiration. Previous esophageal stricture. EXAM: ESOPHOGRAM/BARIUM SWALLOW TECHNIQUE: Single contrast examination was performed using  thin barium. FLUOROSCOPY TIME:  Radiation Exposure Index (if provided by the fluoroscopic device): 11.9 mGy Number of Acquired Spot Images: 0 COMPARISON:  None. FINDINGS: Swallowing appears normal, with no evidence of vestibular penetration or aspiration. A small sliding hiatal hernia is seen. A moderate benign-appearing stricture is seen at the gastroesophageal junction. This prevents passage of a 13 mm barium tablet. No evidence of esophagitis or  mass. Gastroesophageal reflux was seen to the level of the midthoracic esophagus. Mild esophageal dysmotility is noted, with intermittent tertiary contractions. IMPRESSION: Moderate benign-appearing stricture at the gastroesophageal junction, which prevents passage of a 13 mm barium tablet. Small sliding hiatal hernia and gastroesophageal reflux. Mild esophageal dysmotility. No aspiration demonstrated. Electronically Signed   By: Earle Gell M.D.   On: 03/07/2018 15:05   US Thyroid  Result Date: 03/05/2018 CLINICAL DATA:  Incidental on CT. 84 year old male with a multinodular thyroid gland noted on recent CT imaging. EXAM: THYROID ULTRASOUND TECHNIQUE: Ultrasound examination of the thyroid gland and adjacent soft tissues was performed. COMPARISON:  CTA of the chest 03/04/2018 FINDINGS: Parenchymal Echotexture: Moderately heterogenous Isthmus: 0.3 cm Right lobe: 5.2 x 2.1 x 1.7 cm Left lobe: 4.7 x 2.0 x 1.4 cm _________________________________________________________ Estimated total number of nodules >/= 1 cm: 2 Number of spongiform nodules >/=  2 cm not described below (TR1): 0 Number of mixed cystic and solid nodules >/= 1.5 cm not described below (TR2): 0 _________________________________________________________ Nodule # 1 (right): Location: Right; Mid Maximum size: 1.0 cm; Other 2 dimensions: 0.8 x 0.9 cm Composition: solid/almost completely solid (2) Echogenicity: hypoechoic (2) Shape: not taller-than-wide (0) Margins: smooth (0) Echogenic foci: none (0) ACR TI-RADS total points: 4. ACR TI-RADS risk category: TR4 (4-6 points). ACR TI-RADS recommendations: *Given size (>/= 1 - 1.4 cm) and appearance, a follow-up ultrasound in 1 year should be considered based on TI-RADS criteria. _________________________________________________________ Nodule # 1 (left): Location: Left; Inferior Maximum size: 2.4 cm; Other 2 dimensions: 1.4 x 1.4 cm Composition: solid/almost completely solid (2) Echogenicity: hypoechoic (2)  Shape: not taller-than-wide (0) Margins: ill-defined (0) Echogenic foci: none (0) ACR TI-RADS total points: 4. ACR TI-RADS risk category: TR4 (4-6 points). ACR TI-RADS recommendations: **Given size (>/= 1.5 cm) and appearance, fine needle aspiration of this moderately suspicious nodule should be considered based on TI-RADS criteria. _________________________________________________________ Numerous additional small thyroid cysts and nodules are scattered throughout the gland. These do not meet criteria for further evaluation. IMPRESSION: 1. The 2.4 cm TI-RADS category 4 nodule in the left inferior gland technically meets criteria for consideration of fine-needle aspiration biopsy. However, given patient demographics, if they would not be considered a surgical candidate for thyroidectomy, the utility of biopsy is of uncertain clinical benefit and continued surveillance (clinical or with follow-up ultrasound at 1 year) could be considered. 2. The 1.0 cm TI-RADS category 4 nodule in the right  mid gland meets criteria for follow-up thyroid ultrasound in 1 year. The above is in keeping with the ACR TI-RADS recommendations - J Am Coll Radiol 2017;14:587-595. Electronically Signed   By: Jacqulynn Cadet M.D.   On: 03/05/2018 07:47       Subjective: He is feeling better, breathing better. No BM since admission   Discharge Exam: Vitals:   03/08/18 0427 03/08/18 1038  BP: 127/74 117/62  Pulse: 72 72  Resp: 17 16  Temp: 98.7 F (37.1 C) 97.6 F (36.4 C)  SpO2: 95% 94%   Vitals:   03/07/18 1954 03/07/18 2040 03/08/18 0427 03/08/18 1038  BP:  (!) 153/67 127/74 117/62  Pulse:  79 72 72  Resp:  17 17 16   Temp:  98.4 F (36.9 C) 98.7 F (37.1 C) 97.6 F (36.4 C)  TempSrc:  Oral Oral Oral  SpO2: 94% 93% 95% 94%  Weight:      Height:        General: Pt is alert, awake, not in acute distress Cardiovascular: RRR, S1/S2 +, no rubs, no gallops Respiratory: CTA bilaterally, no wheezing, no  rhonchi Abdominal: Soft, NT, ND, bowel sounds + Extremities: no edema, no cyanosis    The results of significant diagnostics from this hospitalization (including imaging, microbiology, ancillary and laboratory) are listed below for reference.     Microbiology: Recent Results (from the past 240 hour(s))  Blood culture (routine x 2)     Status: None (Preliminary result)   Collection Time: 03/04/18  9:10 PM  Result Value Ref Range Status   Specimen Description BLOOD RIGHT HAND  Final   Special Requests   Final    BOTTLES DRAWN AEROBIC ONLY Blood Culture results may not be optimal due to an inadequate volume of blood received in culture bottles   Culture   Final    NO GROWTH 3 DAYS Performed at [facility], [place] [address], [place], [place] [address]    Report Status PENDING  Incomplete  Blood culture (routine x 2)     Status: None (Preliminary result)   Collection Time: 03/04/18  9:18 PM  Result Value Ref Range Status   Specimen Description BLOOD RIGHT WRIST  Final   Special Requests   Final    BOTTLES DRAWN AEROBIC ONLY Blood Culture results may not be optimal due to an inadequate volume of blood received in culture bottles   Culture   Final    NO GROWTH 3 DAYS Performed at [facility], [place] [address] [place],  [address]    Report Status PENDING  Incomplete     Labs: BNP (last 3 results) Recent Labs    05/14/17 1133 03/06/18 0400  BNP 850.4* 269.4*   Basic Metabolic Panel: Recent Labs  Lab 03/04/18 1200 03/05/18 0615 03/06/18 0400 03/07/18 0649  NA 142 141 138 139  K 3.6 3.8 4.0 4.1  CL 109 110 107 107  CO2 22 21* 23 23  GLUCOSE 112* 193* 168* 151*  BUN 23* 23* 22* 27*  CREATININE 1.36* 1.31* 1.15 1.28*  CALCIUM 8.3* 8.0* 8.1* 8.2*   Liver Function Tests: No results for input(s): AST, ALT, ALKPHOS, BILITOT, PROT, ALBUMIN in the last 168 hours. No results for input(s): LIPASE, AMYLASE in the last 168 hours. No results for input(s):  AMMONIA in the last 168 hours. CBC: Recent Labs  Lab 03/04/18 1200 03/05/18 0615 03/06/18 0400  WBC 6.4 8.1 10.0  HGB 14.5 12.2* 12.2*  HCT 45.8 38.9*  37.6*  MCV 91.6 90.0 87.6  PLT 151 153 178   Cardiac Enzymes: No results for input(s): CKTOTAL, CKMB, CKMBINDEX, TROPONINI in the last 168 hours. BNP: Invalid input(s): POCBNP CBG: No results for input(s): GLUCAP in the last 168 hours. D-Dimer No results for input(s): DDIMER in the last 72 hours. Hgb A1c No results for input(s): HGBA1C in the last 72 hours. Lipid Profile No results for input(s): CHOL, HDL, LDLCALC, TRIG, CHOLHDL, LDLDIRECT in the last 72 hours. Thyroid function studies Recent Labs    03/06/18 0400 03/07/18 0649  TSH  --  0.211*  T3FREE 1.8*  --    Anemia work up No results for input(s): VITAMINB12, FOLATE, FERRITIN, TIBC, IRON, RETICCTPCT in the last 72 hours. Urinalysis    Component Value Date/Time   COLORURINE YELLOW 07/13/2017 1740   APPEARANCEUR HAZY (A) 07/13/2017 1740   LABSPEC 1.008 07/13/2017 1740   PHURINE 6.0 07/13/2017 1740   GLUCOSEU NEGATIVE 07/13/2017 1740   HGBUR NEGATIVE 07/13/2017 1740   BILIRUBINUR NEGATIVE 07/13/2017 1740   KETONESUR NEGATIVE 07/13/2017 1740   PROTEINUR NEGATIVE 07/13/2017 1740   UROBILINOGEN 1.0 04/24/2014 0108   NITRITE NEGATIVE 07/13/2017 1740   LEUKOCYTESUR LARGE (A) 07/13/2017 1740   Sepsis Labs Invalid input(s): PROCALCITONIN,  WBC,  LACTICIDVEN Microbiology Recent Results (from the past 240 hour(s))  Blood culture (routine x 2)     Status: None (Preliminary result)   Collection Time: 03/04/18  9:10 PM  Result Value Ref Range Status   Specimen Description BLOOD RIGHT HAND  Final   Special Requests   Final    BOTTLES DRAWN AEROBIC ONLY Blood Culture results may not be optimal due to an inadequate volume of blood received in culture bottles   Culture   Final    NO GROWTH 3 DAYS Performed at [facility], [place] [address], [place], [place]  [address]    Report Status PENDING  Incomplete  Blood culture (routine x 2)     Status: None (Preliminary result)   Collection Time: 03/04/18  9:18 PM  Result Value Ref Range Status   Specimen Description BLOOD RIGHT WRIST  Final   Special Requests   Final    BOTTLES DRAWN AEROBIC ONLY Blood Culture results may not be optimal due to an inadequate volume of blood received in culture bottles   Culture   Final    NO GROWTH 3 DAYS Performed at [facility], [place] [address] [place], [place] [address]    Report Status PENDING  Incomplete     Time coordinating discharge: 35 minutes.   SIGNED:   Elmarie Shiley, MD  [facility] 03/08/2018, 1:56 PM Pager   If 7PM-7AM, please contact night-coverage [email or web address] Password [email or web address]

## 2018-03-08 NOTE — Care Management Important Message (Signed)
Important Message  Patient Details  Name: Bruce Green MRN: [id number] Date of Birth: Feb 21, 1919   Medicare Important Message Given:  No  Patient at end of Life out of respect Important Message not given  [name] 03/08/2018, 1:12 PM

## 2018-03-08 NOTE — Progress Notes (Signed)
SATURATION QUALIFICATIONS: (This note is used to comply with regulatory documentation for home oxygen)  Patient Saturations on Room Air at Rest = 93%  Patient Saturations on Room Air while Ambulating = 88%  Patient Saturations on 2 Liters of oxygen while Ambulating = 97%  Please briefly explain why patient needs home oxygen: recovering from pneumonia. Gets show of breath with exertion.

## 2018-03-09 ENCOUNTER — Telehealth: Payer: Self-pay | Admitting: Cardiovascular Disease

## 2018-03-09 LAB — CULTURE, BLOOD (ROUTINE X 2)
CULTURE: NO GROWTH
CULTURE: NO GROWTH

## 2018-03-09 MED ORDER — APIXABAN 2.5 MG PO TABS: 2.5000 mg | ORAL_TABLET | Freq: Two times a day (BID) | ORAL | 3 refills | Status: AC

## 2018-03-09 NOTE — Telephone Encounter (Signed)
New Message:    She needs clarification of the dose of  pt's Eliquis

## 2018-03-09 NOTE — Telephone Encounter (Signed)
Per chart review, patient had script for eliquis 2.5mg  BID previously but was sent in script for 5mg  BID at hospital discharge 4/15. The patient's daughter asked that the pharmacy call our office for clarification on dose. Advised [facility]ist at [facility] that I would have our clinical pharmacist review for appropriate dose recommendation. Pharmacist asked for a call back/new script if patient needs 2.5mg  dose

## 2018-03-09 NOTE — Telephone Encounter (Signed)
pharmacist notified of RPH/MD eliquis dose recommendations. Phoned in Rx for eliquis 2.5mg  BID #60 with 3 refills

## 2018-03-09 NOTE — Telephone Encounter (Signed)
Reviewed with Dr. Loletha Grayer.  Patient is very high fall risk because of orthostatic hypotension, would prefer to keep him on the Eliquis 2.5 mg bid.

## 2018-04-05 ENCOUNTER — Encounter: Payer: Self-pay | Admitting: Cardiovascular Disease

## 2018-04-05 ENCOUNTER — Ambulatory Visit (INDEPENDENT_AMBULATORY_CARE_PROVIDER_SITE_OTHER): Payer: Medicare Other | Admitting: Cardiovascular Disease

## 2018-04-05 VITALS — BP 98/60 | HR 69 | Ht 70.0 in | Wt 151.0 lb

## 2018-04-05 DIAGNOSIS — Z7901 Long term (current) use of anticoagulants: Secondary | ICD-10-CM | POA: Diagnosis not present

## 2018-04-05 DIAGNOSIS — I480 Paroxysmal atrial fibrillation: Secondary | ICD-10-CM | POA: Diagnosis not present

## 2018-04-05 DIAGNOSIS — Z5181 Encounter for therapeutic drug level monitoring: Secondary | ICD-10-CM

## 2018-04-05 DIAGNOSIS — Z79899 Other long term (current) drug therapy: Secondary | ICD-10-CM | POA: Diagnosis not present

## 2018-04-05 DIAGNOSIS — E785 Hyperlipidemia, unspecified: Secondary | ICD-10-CM

## 2018-04-05 DIAGNOSIS — G909 Disorder of the autonomic nervous system, unspecified: Secondary | ICD-10-CM

## 2018-04-05 DIAGNOSIS — I5020 Unspecified systolic (congestive) heart failure: Secondary | ICD-10-CM | POA: Diagnosis not present

## 2018-04-05 DIAGNOSIS — I2510 Atherosclerotic heart disease of native coronary artery without angina pectoris: Secondary | ICD-10-CM | POA: Diagnosis not present

## 2018-04-05 NOTE — Patient Instructions (Signed)
Dr Croitoru recommends that you schedule a follow-up appointment in 3 months.  If you need a refill on your cardiac medications before your next appointment, please call your pharmacy. 

## 2018-04-05 NOTE — Progress Notes (Signed)
ROS  Patient ID: Bruce Green, male   DOB: 1918-12-27, 84 y.o.   MRN: [id number]    Cardiology Office Note    Date:  04/06/2018   ID:  Bruce Green, DOB 10-07-1919, MRN [id number]  PCP:  Kathyrn Lass, MD  Cardiologist:   Sanda Klein, MD   Chief Complaint  Patient presents with  . Hypotension  . Coronary Artery Disease    History of Present Illness:  Bruce Green is a 84 y.o. male with coronary artery disease roughly 4 years after multivessel bypass surgery which led to marked improvement in left ventricular systolic function (EF 77-41 %, increased to about 45% post CABG, reduced to 25% during AFib and acute illness in June 2018), severe orthostatic hypotension, hyperlipidemia.  Recently his biggest challenges have been related to worsening dementia and repeated falls static hypotension.  Orthostatic hypotension persist despite maximum doses of Midodrine 3 times a day.  We try to initiate treatment with Northerra, but after the first pill he developed severe nausea, he has recovered from that illness vomiting and diarrhea.  It seems that this was due to an infectious gastroenteritis since multiple other people were sick at the time.  And returns to discuss reinitiation of treatment.  His daughter Bruce Green and his full-time caregiver are with him today.  Clearly Bruce Green's memory is deteriorating.  He told me the same story 4-5 times in the span of 10 minutes today.   nevertheless, he remains fully engaged and oriented.  He has not had any focal neurological problems and there is denies dyspnea or angina.  He continues to be very prone to falling.  His blood pressure today was 98/60 sitting down so we did not check orthostatic vital signs.  He was able to stand for a few seconds to be weighed.  He becomes very quickly dizzy.  He took Augmentin and prednisone for left lower lobe pneumonia in mid April 2019, but these have been stopped.  He is not on any other vasoactive medicines except for  tamsulosin for prostatism.  He has not received any diuretic in months.  The syncopal events are occurring despite the fact that he is on Midrin 10 mg 3 times daily.  He has not received Lasix in several weeks.  He has had a relatively stable weight between 146 and 155 pounds on his home scale.  There is no clear correlation between his weight and the episodes of syncope.  Appears elderly and frail and a little paler than I remember him He underwent TEE guided cardioversion for atrial fibrillation in June 2018.  His TEE showed evidence of grade 4 aortic atherosclerosis and severe mitral regurgitation (the transthoracic study showed only moderate mitral regurgitation).  Small PFO was incidentally recorded    Past Medical History:  Diagnosis Date  . Anxiety   . Cataract   . CKD (chronic kidney disease)   . Colon polyp   . Coronary artery disease 04/21/2014     severe triple vessel  . Dyslipidemia   . GERD (gastroesophageal reflux disease)   . Hiatal hernia    6cm  . Hypertension   . Ischemic cardiomyopathy 03/2014   EF 25-30%  . Pneumonia   . Stricture esophagus    distal  . TIA (transient ischemic attack)   . Vitamin D deficiency     Past Surgical History:  Procedure Laterality Date  . APPENDECTOMY    . CARDIAC SURGERY    . CARDIOVERSION N/A 05/19/2017  Procedure: CARDIOVERSION;  Surgeon: Larey Dresser, MD;  Location: [facility] ENDOSCOPY;  Service: Cardiovascular;  Laterality: N/A;  . CHOLECYSTECTOMY    . CORONARY ARTERY BYPASS GRAFT N/A 04/24/2014   Procedure: CORONARY ARTERY BYPASS GRAFTING (CABG);  Surgeon: Gaye Pollack, MD;  Location: [facility];  Service: Open Heart Surgery;  Laterality: N/A;  Times 3 using left internal mammary artery and endoscopically harvested right saphenous vein  . ERCP N/A 11/13/2017   Procedure: ENDOSCOPIC RETROGRADE CHOLANGIOPANCREATOGRAPHY (ERCP);  Surgeon: Clarene Essex, MD;  Location: [facility] ENDOSCOPY;  Service: Endoscopy;  Laterality: N/A;  .  ESOPHAGOGASTRODUODENOSCOPY  04/09/2012   Procedure: ESOPHAGOGASTRODUODENOSCOPY (EGD);  Surgeon: Gatha Mayer, MD;  Location: [facility] ENDOSCOPY;  Service: Endoscopy;  Laterality: N/A;  . INTRAOPERATIVE TRANSESOPHAGEAL ECHOCARDIOGRAM N/A 04/24/2014   Procedure: INTRAOPERATIVE TRANSESOPHAGEAL ECHOCARDIOGRAM;  Surgeon: Gaye Pollack, MD;  Location: [facility] OR;  Service: Open Heart Surgery;  Laterality: N/A;  . LEFT AND RIGHT HEART CATHETERIZATION WITH CORONARY ANGIOGRAM N/A 04/21/2014   Procedure: LEFT AND RIGHT HEART CATHETERIZATION WITH CORONARY ANGIOGRAM;  Surgeon: Burnell Blanks, MD;  Location: [facility] CATH LAB;  Service: Cardiovascular;  Laterality: N/A;  . SHOULDER SURGERY    . TEE WITHOUT CARDIOVERSION N/A 05/19/2017   Procedure: TRANSESOPHAGEAL ECHOCARDIOGRAM (TEE);  Surgeon: Larey Dresser, MD;  Location: [facility] ENDOSCOPY;  Service: Cardiovascular;  Laterality: N/A;    Outpatient Medications Prior to Visit  Medication Sig Dispense Refill  . acetaminophen (TYLENOL) 500 MG tablet Take 500-1,000 mg by mouth 2 (two) times daily as needed for moderate pain.     Marland Kitchen amiodarone (PACERONE) 200 MG tablet Take 1 tablet (200 mg total) by mouth daily. 90 tablet 3  . amoxicillin-clavulanate (AUGMENTIN) 875-125 MG tablet Take 1 tablet by mouth every 12 (twelve) hours. 10 tablet 0  . apixaban (ELIQUIS) 2.5 MG TABS tablet Take 1 tablet (2.5 mg total) by mouth 2 (two) times daily. 60 tablet 3  . atorvastatin (LIPITOR) 80 MG tablet Take 40 mg by mouth daily.    . B Complex-C (B-COMPLEX WITH VITAMIN C) tablet Take 1 tablet by mouth daily.    . Cholecalciferol (VITAMIN D3) 5000 units TABS Take 5,000 Units by mouth every Monday, Wednesday, and Friday.     . Droxidopa (NORTHERA) 100 MG CAPS Take by mouth as directed. Per titration    . Elastic Bandages & Supports (ABDOMINAL BINDER/ELASTIC MED) MISC Wear daily 1 each 0  . fluticasone (FLONASE) 50 MCG/ACT nasal spray Place 1 spray into both nostrils daily as needed for  allergies or rhinitis.    . folic acid (FOLVITE) 242 MCG tablet Take 400 mcg by mouth daily.    . furosemide (LASIX) 40 MG tablet Take 0.5 tablets (20 mg total) by mouth daily as needed. FOR WT GAIN >3# IN 1 DAY  -OR- >5# IN A WEEK (Patient taking differently: Take 20 mg by mouth daily as needed (for a weight gain of 3 pounds or more in a day or 5 pounds or more in a week). ) 30 tablet 2  . guaiFENesin (MUCINEX) 600 MG 12 hr tablet Take 1 tablet (600 mg total) by mouth 2 (two) times daily. 20 tablet 0  . ipratropium-albuterol (DUONEB) 0.5-2.5 (3) MG/3ML SOLN Take 3 mLs by nebulization 3 (three) times daily. 360 mL 0  . loratadine (CLARITIN) 10 MG tablet Take 10 mg by mouth daily.     . Magnesium 400 MG CAPS Take 400 mg by mouth every other day. (Patient taking differently: Take 400 mg by  mouth every Monday, Wednesday, and Friday. )    . Melatonin 5 MG TABS Take 5-10 mg by mouth at bedtime as needed (for sleep).     . midodrine (PROAMATINE) 10 MG tablet Take 1 tablet (10 mg total) by mouth 3 (three) times daily with meals. 90 tablet 6  . pantoprazole (PROTONIX) 40 MG tablet Take 1 tablet (40 mg total) by mouth 2 (two) times daily. 60 tablet 0  . polyethylene glycol (MIRALAX / GLYCOLAX) packet Take 17 g by mouth daily as needed for mild constipation. 14 each 0  . potassium chloride SA (K-DUR,KLOR-CON) 20 MEQ tablet Take 1 tablet (20 mEq total) by mouth daily. TAKE WHEN TAKING YOUR LASIX (Patient taking differently: Take 20 mEq by mouth daily as needed (only when taking Lasix for 3-5 lb. weight gain). ) 30 tablet 5  . predniSONE (DELTASONE) 20 MG tablet Take 2 tablet for 2 days then 1 tablet for 2 days then stop. 6 tablet 0  . PROAIR HFA 108 (90 Base) MCG/ACT inhaler Inhale 2 puffs into the lungs every 4-6 hours as needed for shortness of breath or wheezing    . Probiotic Product (PROBIOTIC PO) Take 1 capsule by mouth daily. 30 BILLION strength    . tamsulosin (FLOMAX) 0.4 MG CAPS capsule Take 1 capsule  (0.4 mg total) by mouth daily after supper. 30 capsule    No facility-administered medications prior to visit.      Allergies:   Other; Septra [sulfamethoxazole-trimethoprim]; Tramadol; and Sulfa antibiotics   Social History   Socioeconomic History  . Marital status: Widowed    Spouse name: Not on file  . Number of children: 2  . Years of education: Not on file  . Highest education level: Not on file  Occupational History  . Occupation: retired  Scientific laboratory technician  . Financial resource strain: Not on file  . Food insecurity:    Worry: Not on file    Inability: Not on file  . Transportation needs:    Medical: Not on file    Non-medical: Not on file  Tobacco Use  . Smoking status: Never Smoker  . Smokeless tobacco: Never Used  Substance and Sexual Activity  . Alcohol use: Yes    Alcohol/week: 0.6 oz    Types: 1 Standard drinks or equivalent per week    Comment: rare  . Drug use: No  . Sexual activity: Not on file  Lifestyle  . Physical activity:    Days per week: Not on file    Minutes per session: Not on file  . Stress: Not on file  Relationships  . Social connections:    Talks on phone: Not on file    Gets together: Not on file    Attends religious service: Not on file    Active member of club or organization: Not on file    Attends meetings of clubs or organizations: Not on file    Relationship status: Not on file  Other Topics Concern  . Not on file  Social History Narrative   Retired Conservation officer, historic buildings. Widowed. Was in [place] and [place]. He has 2 daughters and several grandchildren.         Family History:  The patient's family history includes Aneurysm in his brother; Arthritis in his sister; Breast cancer in his sister; Colon cancer in his brother; Dementia in his brother; Heart attack in his mother; Kidney disease in his father; Osteoporosis in his sister.   ROS:   Please see the history  of present illness.    ROS All other systems reviewed and are  negative.   PHYSICAL EXAM:   VS:  BP 98/60   Pulse 69   Wt 151 lb (68.5 kg)   BMI 21.67 kg/m     General: Alert, oriented x3, no distress, he looks elderly, frail and paler than I remember.  Mucous membranes do not appear quite as pale. Head: no evidence of trauma, PERRL, EOMI, no exophtalmos or lid lag, no myxedema, no xanthelasma; normal ears, nose and oropharynx Neck: normal jugular venous pulsations and no hepatojugular reflux; brisk carotid pulses without delay and no carotid bruits Chest: clear to auscultation, no signs of consolidation by percussion or palpation, normal fremitus, symmetrical and full respiratory excursions Cardiovascular: normal position and quality of the apical impulse, regular rhythm, normal first and second heart sounds, no murmurs, rubs or gallops Abdomen: no tenderness or distention, no masses by palpation, no abnormal pulsatility or arterial bruits, normal bowel sounds, no hepatosplenomegaly Extremities: no clubbing, cyanosis or edema; 2+ radial, ulnar and brachial pulses bilaterally; 2+ right femoral, posterior tibial and dorsalis pedis pulses; 2+ left femoral, posterior tibial and dorsalis pedis pulses; no subclavian or femoral bruits Neurological: grossly nonfocal, but short-term memory is clearly very poor.  Psych: Normal mood and affect  Wt Readings from Last 3 Encounters:  04/05/18 151 lb (68.5 kg)  03/05/18 154 lb 1.6 oz (69.9 kg)  02/12/18 156 lb 6.4 oz (70.9 kg)    Studies/Labs Reviewed:   EKG:  EKG is not remains ordered today.  Previous tracing showed normal sinus rhythm with nonspecific intraventricular conduction delays, Q waves in the inferior leads, slightly scooped out ST segments in lead V6. Recent Labs: 06/22/2017: Magnesium 2.4 02/12/2018: ALT 28 03/06/2018: B Natriuretic Peptide 751.4; Hemoglobin 12.2; Platelets 178 03/07/2018: BUN 27; Creatinine, Ser 1.28; Potassium 4.1; Sodium 139; TSH 0.211   Lipid Panel    Component Value  Date/Time   CHOL 147 10/09/2016 0920   TRIG 234 (H) 10/09/2016 0920   HDL 39 (L) 10/09/2016 0920   CHOLHDL 3.8 10/09/2016 0920   VLDL 47 (H) 10/09/2016 0920   LDLCALC 61 10/09/2016 0920     ASSESSMENT:    1. Systolic CHF with reduced left ventricular function, NYHA class 2 ([place])   2. Autonomic dysfunction   3. Paroxysmal atrial fibrillation (HCC)   4. Long term (current) use of anticoagulants   5. Encounter for monitoring amiodarone therapy   6. Coronary artery disease involving native coronary artery of native heart without angina pectoris   7. Dyslipidemia      PLAN:  In order of problems listed above:  1. CHF:  most recent assessment EF 25-30% in June 2018.  We have stopped all medicines for heart failure due to severe orthostatic hypotension.  Thankfully he has remained compensated from a volume standpoint without the use of diuretics. 2. Autonomic dysfunction: Despite maximum dose midodrine he continues to have syncopal events, falls, injuries.  Will restart Northerra.  We had our clinical pharmacist go over the titration protocol with his daughter and caregiver today.  We have also tried an abdominal binder. His legs are so thin that compression stockings do very little.  Tamsulosin may be worsening orthostatic hypotension but appears to be a necessary medication to avoid urinary retention. 3. AFib: No clinical recurrence since urgent cardioversion in June 2018.  On low-dose amiodarone.  He developed asterixis on higher amiodarone doses.  The arrhythmia occurred on the background of multiple simultaneous medical  problems including hematuria, common bile duct stone, pneumonia. CHADSVasc 5 (age 29, CAD, CHF, hx of HTN).   4. Eliquis: If he continues to fall may have to stop the anticoagulant.  The dose of anticoagulant is adjusted for his low body weight and age. 5. Amiodarone: Monitor thyroid (suspect sick euthyroid syndrome: Mildly decreased TSH, decreased T3 but elevated T4 in  April 2019, but this was during acute illness; normal TSH in late March 2019) and liver tests (normal in March 2019) every 6 months.   6. CAD s/p CABG: [name] has not had any angina since his bypass procedure.  He remains asymptomatic. 7. HLP: Last lipid profile was in desirable range with an LDL of 61.  Continue high-dose statin.   Medication Adjustments/Labs and Tests Ordered: Current medicines are reviewed at length with the patient today.  Concerns regarding medicines are outlined above.  Medication changes, Labs and Tests ordered today are listed in the Patient Instructions below. Patient Instructions  Dr Sallyanne Kuster recommends that you schedule a follow-up appointment in 3 months.  If you need a refill on your cardiac medications before your next appointment, please call your pharmacy.      Signed, Sanda Klein, MD  04/06/2018 3:37 PM    [facility] [address], [place], [place]  [address] Phone: [phone]; Fax: [phone]

## 2018-04-06 ENCOUNTER — Encounter: Payer: Self-pay | Admitting: Cardiovascular Disease

## 2018-04-22 ENCOUNTER — Telehealth: Payer: Self-pay | Admitting: Cardiovascular Disease

## 2018-04-22 NOTE — Telephone Encounter (Signed)
New message    Pt daughter is calling about pt Northera. She said she is calling to check in and to ask a couple questions. Please call.

## 2018-04-22 NOTE — Telephone Encounter (Signed)
Daughter and caregiver called back to clarify dose and symptoms.  Patient experienced worsening diarrhea after increasing Northera dose to 200mg  in Am, 100mg  mid-day, and 200mg  in PM. He is also feeling weak and sleepy  BP readings available 104/58; 119/65; 108/56   New Recommendation:  1.Decrease Northera dose to 100mg  TID 2. Stay hydrated 3. Monitor BP daily 4. Call back next week to update clinic on symptoms and BP readings   Melba Araki Rodriguez-Guzman PharmD, BCPS, [place] [id number] 04/22/2018 4:06 PM

## 2018-04-22 NOTE — Telephone Encounter (Signed)
Patient currently taking Northera 200mg  in AM, 100mg  mid-day, and 200mg  in PM. Reports some diarrhea but not daily of significant per patient's daughter.  Instructed to continue current dose for 1 more week, okay to decrease dose back to 100mg  TID if diarrhea continues.   Paperwork for [facility] started.

## 2018-05-11 ENCOUNTER — Other Ambulatory Visit: Payer: Self-pay | Admitting: Pharmacist

## 2018-05-11 MED ORDER — DROXIDOPA 100 MG PO CAPS: 100.0000 mg | ORAL_CAPSULE | Freq: Three times a day (TID) | ORAL | 3 refills | Status: DC

## 2018-05-11 NOTE — Telephone Encounter (Signed)
New Rx for Northera 100mg  TID sent to [facility] today 05/11/18.   Patient was unable to tolerate increased dose

## 2018-06-04 ENCOUNTER — Other Ambulatory Visit: Payer: Self-pay | Admitting: Cardiovascular Disease

## 2018-06-04 NOTE — Telephone Encounter (Signed)
Pantoprazole had been refilled by Dr. Carlean Purl. Defer to him

## 2018-06-14 ENCOUNTER — Encounter (HOSPITAL_COMMUNITY): Payer: Self-pay | Admitting: Pharmacy Technician

## 2018-06-14 ENCOUNTER — Other Ambulatory Visit: Payer: Self-pay

## 2018-06-14 ENCOUNTER — Emergency Department (HOSPITAL_COMMUNITY): Payer: Medicare Other

## 2018-06-14 ENCOUNTER — Inpatient Hospital Stay (HOSPITAL_COMMUNITY)
Admission: EM | Admit: 2018-06-14 | Discharge: 2018-06-18 | DRG: 871 | Disposition: A | Discharge status: Hospice | Payer: Medicare Other | Attending: Internal Medicine | Admitting: Internal Medicine

## 2018-06-14 DIAGNOSIS — Z885 Allergy status to narcotic agent status: Secondary | ICD-10-CM

## 2018-06-14 DIAGNOSIS — N183 Chronic kidney disease, stage 3 unspecified: Secondary | ICD-10-CM

## 2018-06-14 DIAGNOSIS — I714 Abdominal aortic aneurysm, without rupture, unspecified: Secondary | ICD-10-CM | POA: Diagnosis present

## 2018-06-14 DIAGNOSIS — Z882 Allergy status to sulfonamides status: Secondary | ICD-10-CM

## 2018-06-14 DIAGNOSIS — Z841 Family history of disorders of kidney and ureter: Secondary | ICD-10-CM

## 2018-06-14 DIAGNOSIS — S0003XA Contusion of scalp, initial encounter: Secondary | ICD-10-CM | POA: Diagnosis present

## 2018-06-14 DIAGNOSIS — J189 Pneumonia, unspecified organism: Secondary | ICD-10-CM | POA: Diagnosis present

## 2018-06-14 DIAGNOSIS — I4891 Unspecified atrial fibrillation: Secondary | ICD-10-CM | POA: Diagnosis present

## 2018-06-14 DIAGNOSIS — Z66 Do not resuscitate: Secondary | ICD-10-CM | POA: Diagnosis not present

## 2018-06-14 DIAGNOSIS — G9341 Metabolic encephalopathy: Secondary | ICD-10-CM

## 2018-06-14 DIAGNOSIS — Z884 Allergy status to anesthetic agent status: Secondary | ICD-10-CM

## 2018-06-14 DIAGNOSIS — Z8673 Personal history of transient ischemic attack (TIA), and cerebral infarction without residual deficits: Secondary | ICD-10-CM

## 2018-06-14 DIAGNOSIS — F05 Delirium due to known physiological condition: Secondary | ICD-10-CM | POA: Diagnosis present

## 2018-06-14 DIAGNOSIS — W19XXXA Unspecified fall, initial encounter: Secondary | ICD-10-CM

## 2018-06-14 DIAGNOSIS — Z888 Allergy status to other drugs, medicaments and biological substances status: Secondary | ICD-10-CM

## 2018-06-14 DIAGNOSIS — Z951 Presence of aortocoronary bypass graft: Secondary | ICD-10-CM

## 2018-06-14 DIAGNOSIS — F0390 Unspecified dementia without behavioral disturbance: Secondary | ICD-10-CM | POA: Diagnosis present

## 2018-06-14 DIAGNOSIS — Z9981 Dependence on supplemental oxygen: Secondary | ICD-10-CM

## 2018-06-14 DIAGNOSIS — Z9049 Acquired absence of other specified parts of digestive tract: Secondary | ICD-10-CM

## 2018-06-14 DIAGNOSIS — I2510 Atherosclerotic heart disease of native coronary artery without angina pectoris: Secondary | ICD-10-CM | POA: Diagnosis present

## 2018-06-14 DIAGNOSIS — A419 Sepsis, unspecified organism: Secondary | ICD-10-CM | POA: Diagnosis not present

## 2018-06-14 DIAGNOSIS — I5020 Unspecified systolic (congestive) heart failure: Secondary | ICD-10-CM | POA: Diagnosis present

## 2018-06-14 DIAGNOSIS — Z803 Family history of malignant neoplasm of breast: Secondary | ICD-10-CM

## 2018-06-14 DIAGNOSIS — I951 Orthostatic hypotension: Secondary | ICD-10-CM | POA: Diagnosis present

## 2018-06-14 DIAGNOSIS — J690 Pneumonitis due to inhalation of food and vomit: Secondary | ICD-10-CM | POA: Diagnosis present

## 2018-06-14 DIAGNOSIS — J9621 Acute and chronic respiratory failure with hypoxia: Secondary | ICD-10-CM | POA: Diagnosis not present

## 2018-06-14 DIAGNOSIS — Z7901 Long term (current) use of anticoagulants: Secondary | ICD-10-CM

## 2018-06-14 DIAGNOSIS — Z881 Allergy status to other antibiotic agents status: Secondary | ICD-10-CM

## 2018-06-14 DIAGNOSIS — I130 Hypertensive heart and chronic kidney disease with heart failure and stage 1 through stage 4 chronic kidney disease, or unspecified chronic kidney disease: Secondary | ICD-10-CM | POA: Diagnosis present

## 2018-06-14 DIAGNOSIS — I255 Ischemic cardiomyopathy: Secondary | ICD-10-CM | POA: Diagnosis present

## 2018-06-14 DIAGNOSIS — I480 Paroxysmal atrial fibrillation: Secondary | ICD-10-CM | POA: Diagnosis present

## 2018-06-14 DIAGNOSIS — Z515 Encounter for palliative care: Secondary | ICD-10-CM | POA: Diagnosis not present

## 2018-06-14 DIAGNOSIS — I340 Nonrheumatic mitral (valve) insufficiency: Secondary | ICD-10-CM | POA: Diagnosis present

## 2018-06-14 DIAGNOSIS — E785 Hyperlipidemia, unspecified: Secondary | ICD-10-CM | POA: Diagnosis present

## 2018-06-14 DIAGNOSIS — J181 Lobar pneumonia, unspecified organism: Secondary | ICD-10-CM

## 2018-06-14 DIAGNOSIS — Z8249 Family history of ischemic heart disease and other diseases of the circulatory system: Secondary | ICD-10-CM

## 2018-06-14 DIAGNOSIS — Z800 Family history of malignant neoplasm of digestive organs: Secondary | ICD-10-CM

## 2018-06-14 DIAGNOSIS — I5022 Chronic systolic (congestive) heart failure: Secondary | ICD-10-CM | POA: Diagnosis present

## 2018-06-14 LAB — I-STAT CG4 LACTIC ACID, ED
Lactic Acid, Venous: 2.08 mmol/L (ref 0.5–1.9)
Lactic Acid, Venous: 1.43 mmol/L (ref 0.5–1.9)

## 2018-06-14 LAB — COMPREHENSIVE METABOLIC PANEL
ALBUMIN: 2.3 g/dL — AB (ref 3.5–5.0)
ALT: 67 U/L — ABNORMAL HIGH (ref 0–44)
ANION GAP: 10 (ref 5–15)
AST: 76 U/L — ABNORMAL HIGH (ref 15–41)
Alkaline Phosphatase: 62 U/L (ref 38–126)
BUN: 27 mg/dL — ABNORMAL HIGH (ref 8–23)
CHLORIDE: 108 mmol/L (ref 98–111)
CO2: 25 mmol/L (ref 22–32)
Calcium: 8.2 mg/dL — ABNORMAL LOW (ref 8.9–10.3)
Creatinine, Ser: 1.43 mg/dL — ABNORMAL HIGH (ref 0.61–1.24)
GFR calc Af Amer: 46 mL/min — ABNORMAL LOW (ref >60)
GFR calc non Af Amer: 40 mL/min — ABNORMAL LOW (ref >60)
Glucose, Bld: 126 mg/dL — ABNORMAL HIGH (ref 70–99)
POTASSIUM: 4 mmol/L (ref 3.5–5.1)
Sodium: 143 mmol/L (ref 135–145)
Total Bilirubin: 0.7 mg/dL (ref 0.3–1.2)
Total Protein: 4.6 g/dL — ABNORMAL LOW (ref 6.5–8.1)

## 2018-06-14 LAB — I-STAT ARTERIAL BLOOD GAS, ED
Acid-Base Excess: 1 mmol/L (ref 0.0–2.0)
Bicarbonate: 24.4 mmol/L (ref 20.0–28.0)
O2 SAT: 94 %
PCO2 ART: 33.7 mmHg (ref 32.0–48.0)
PH ART: 7.467 — AB (ref 7.350–7.450)
TCO2: 25 mmol/L (ref 22–32)
pO2, Arterial: 67 mmHg — ABNORMAL LOW (ref 83.0–108.0)

## 2018-06-14 LAB — TROPONIN I: Troponin I: <0.03 ng/mL

## 2018-06-14 LAB — CBC WITH DIFFERENTIAL/PLATELET
ABS IMMATURE GRANULOCYTES: 0 10*3/uL (ref 0.0–0.1)
BASOS PCT: 0 %
Basophils Absolute: 0 10*3/uL (ref 0.0–0.1)
Eosinophils Absolute: 0.1 10*3/uL (ref 0.0–0.7)
Eosinophils Relative: 1 %
HCT: 39.7 % (ref 39.0–52.0)
HEMOGLOBIN: 12.3 g/dL — AB (ref 13.0–17.0)
Immature Granulocytes: 0 %
LYMPHS PCT: 6 %
Lymphs Abs: 0.6 10*3/uL — ABNORMAL LOW (ref 0.7–4.0)
MCH: 27.5 pg (ref 26.0–34.0)
MCHC: 31 g/dL (ref 30.0–36.0)
MCV: 88.6 fL (ref 78.0–100.0)
MONO ABS: 1 10*3/uL (ref 0.1–1.0)
MONOS PCT: 10 %
NEUTROS ABS: 8.3 10*3/uL — AB (ref 1.7–7.7)
NEUTROS PCT: 83 %
PLATELETS: 338 10*3/uL (ref 150–400)
RBC: 4.48 MIL/uL (ref 4.22–5.81)
RDW: 14.6 % (ref 11.5–15.5)
WBC: 10 10*3/uL (ref 4.0–10.5)

## 2018-06-14 MED ORDER — DROXIDOPA 100 MG PO CAPS
100.0000 mg | ORAL_CAPSULE | Freq: Three times a day (TID) | ORAL | Status: DC
  Administered 2018-06-15 – 2018-06-17 (×6): 100 mg via ORAL
  Administered 2018-06-17: 13:00:00 via ORAL
  Administered 2018-06-18: 100 mg via ORAL
  Filled 2018-06-14 (×8): qty 1

## 2018-06-14 MED ORDER — ALBUTEROL SULFATE (2.5 MG/3ML) 0.083% IN NEBU
2.5000 mg | INHALATION_SOLUTION | RESPIRATORY_TRACT | Status: DC | PRN
  Administered 2018-06-18: 2.5 mg via RESPIRATORY_TRACT
  Filled 2018-06-14: qty 3

## 2018-06-14 MED ORDER — IPRATROPIUM-ALBUTEROL 0.5-2.5 (3) MG/3ML IN SOLN
3.0000 mL | Freq: Three times a day (TID) | RESPIRATORY_TRACT | Status: DC
  Administered 2018-06-14: 3 mL via RESPIRATORY_TRACT
  Filled 2018-06-14: qty 3

## 2018-06-14 MED ORDER — SODIUM CHLORIDE 0.9% FLUSH
3.0000 mL | Freq: Two times a day (BID) | INTRAVENOUS | Status: DC
  Administered 2018-06-14 – 2018-06-16 (×3): 3 mL via INTRAVENOUS

## 2018-06-14 MED ORDER — VANCOMYCIN HCL IN DEXTROSE 1-5 GM/200ML-% IV SOLN
1000.0000 mg | INTRAVENOUS | Status: DC
  Administered 2018-06-15 – 2018-06-16 (×2): 1000 mg via INTRAVENOUS
  Filled 2018-06-14 (×3): qty 200

## 2018-06-14 MED ORDER — AMIODARONE HCL 100 MG PO TABS
200.0000 mg | ORAL_TABLET | Freq: Every day | ORAL | Status: DC
  Administered 2018-06-15 – 2018-06-16 (×2): 200 mg via ORAL
  Administered 2018-06-17: 100 mg via ORAL
  Administered 2018-06-18: 200 mg via ORAL
  Filled 2018-06-14 (×5): qty 2

## 2018-06-14 MED ORDER — PIPERACILLIN-TAZOBACTAM 3.375 G IVPB 30 MIN
3.3750 g | Freq: Once | INTRAVENOUS | Status: AC
  Administered 2018-06-14: 3.375 g via INTRAVENOUS
  Filled 2018-06-14: qty 50

## 2018-06-14 MED ORDER — ATORVASTATIN CALCIUM 40 MG PO TABS
40.0000 mg | ORAL_TABLET | Freq: Every day | ORAL | Status: DC
  Administered 2018-06-15 – 2018-06-18 (×3): 40 mg via ORAL
  Filled 2018-06-14 (×4): qty 1

## 2018-06-14 MED ORDER — SODIUM CHLORIDE 0.9 % IV SOLN
1000.0000 mL | INTRAVENOUS | Status: DC
  Administered 2018-06-14: 1000 mL via INTRAVENOUS

## 2018-06-14 MED ORDER — SODIUM CHLORIDE 0.9 % IV SOLN
2.0000 g | INTRAVENOUS | Status: DC
  Administered 2018-06-14: 2 g via INTRAVENOUS
  Filled 2018-06-14: qty 20

## 2018-06-14 MED ORDER — AZITHROMYCIN 500 MG IV SOLR
500.0000 mg | INTRAVENOUS | Status: DC
  Filled 2018-06-14: qty 500

## 2018-06-14 MED ORDER — PIPERACILLIN-TAZOBACTAM 3.375 G IVPB
3.3750 g | Freq: Three times a day (TID) | INTRAVENOUS | Status: DC
  Administered 2018-06-15: 3.375 g via INTRAVENOUS
  Filled 2018-06-14 (×2): qty 50

## 2018-06-14 MED ORDER — SODIUM CHLORIDE 0.9 % IV SOLN: 250.0000 mL | INTRAVENOUS | Status: DC | PRN

## 2018-06-14 MED ORDER — MIDODRINE HCL 5 MG PO TABS
10.0000 mg | ORAL_TABLET | Freq: Three times a day (TID) | ORAL | Status: DC
  Administered 2018-06-15 – 2018-06-18 (×9): 10 mg via ORAL
  Filled 2018-06-14 (×9): qty 2

## 2018-06-14 MED ORDER — SODIUM CHLORIDE 0.9% FLUSH: 3.0000 mL | INTRAVENOUS | Status: DC | PRN

## 2018-06-14 MED ORDER — FUROSEMIDE 20 MG PO TABS: 20.0000 mg | ORAL_TABLET | Freq: Every day | ORAL | Status: DC | PRN

## 2018-06-14 MED ORDER — APIXABAN 2.5 MG PO TABS
2.5000 mg | ORAL_TABLET | Freq: Two times a day (BID) | ORAL | Status: DC
  Administered 2018-06-14 – 2018-06-18 (×7): 2.5 mg via ORAL
  Filled 2018-06-14 (×8): qty 1

## 2018-06-14 MED ORDER — MELATONIN 3 MG PO TABS
6.0000 mg | ORAL_TABLET | Freq: Every evening | ORAL | Status: DC | PRN
  Administered 2018-06-15 (×2): 6 mg via ORAL
  Filled 2018-06-14 (×4): qty 2

## 2018-06-14 MED ORDER — VANCOMYCIN HCL 10 G IV SOLR
1500.0000 mg | Freq: Once | INTRAVENOUS | Status: AC
  Administered 2018-06-14: 1500 mg via INTRAVENOUS
  Filled 2018-06-14: qty 1500

## 2018-06-14 NOTE — ED Triage Notes (Signed)
Pt BIB EMS from home with reports of increased confusion, gait abnormalities and SOB onset last week. Pt fell last week and hit his head and since then there has been an increase in AMS. Pt on eliquis. Seen at PCP Friday and started on ABX for SOB. Pt normally wears 2L [place] and has been increased to 5L [place] with saturations around 92%. All other VSS.

## 2018-06-14 NOTE — ED Provider Notes (Signed)
[facility] EMERGENCY DEPARTMENT Provider Note   CSN: [id number] Arrival date & time: 06/14/18  1642     History   Chief Complaint Chief Complaint  Patient presents with  . Altered Mental Status    HPI Bruce Green is a 84 y.o. male.  Pt presents to the ED today with altered mental status.  The pt fell a few days ago and hit his head.  He is on Eliquis for a.fib.  His daughter took him to his doctor who thought he may be developing pna, so he was given a rx for amox.  He's been on that for 2 days.  No ct scan done.  Pt's daughter said he is getting more confused and is ambulating worse than usual.  Pt is very confused and is unable to give a hx.  CHA2DS2/VAS Stroke Risk Points  Current as of 4 minutes ago     4 >= 2 Points: High Risk  1 - 1.99 Points: Medium Risk  0 Points: Low Risk    This is the only CHA2DS2/VAS Stroke Risk Points available for the past  year.:  Last Change: N/A     Details    This score determines the patient's risk of having a stroke if the  patient has atrial fibrillation.       Points Metrics  1 Has Congestive Heart Failure:  Yes    Current as of 4 minutes ago  1 Has Vascular Disease:  Yes    Current as of 4 minutes ago  0 Has Hypertension:  No    Current as of 4 minutes ago  2 Age:  37    Current as of 4 minutes ago  0 Has Diabetes:  No    Current as of 4 minutes ago  0 Had Stroke:  No  Had TIA:  No  Had thromboembolism:  No    Current as of 4 minutes ago  0 Male:  No    Current as of 4 minutes ago          O2 sats drop in the 80s with movement from ambulance stretcher to our ED bed.      Past Medical History:  Diagnosis Date  . Anxiety   . Cataract   . CKD (chronic kidney disease)   . Colon polyp   . Coronary artery disease 04/21/2014     severe triple vessel  . Dyslipidemia   . GERD (gastroesophageal reflux disease)   . Hiatal hernia    6cm  . Hypertension   . Ischemic cardiomyopathy 03/2014   EF  25-30%  . Pneumonia   . Stricture esophagus    distal  . TIA (transient ischemic attack)   . Vitamin D deficiency     Patient Active Problem List   Diagnosis Date Noted  . PNA (pneumonia) 06/14/2018  . Acute on chronic respiratory failure with hypoxia ([place]) 06/14/2018  . CKD (chronic kidney disease) stage 3, GFR 30-59 ml/min (HCC)   . Thyroid nodule 03/04/2018  . Dyslipidemia 03/04/2018  . CAP (community acquired pneumonia) 03/04/2018  . Atrial fibrillation ([place])   . Autonomic dysfunction 02/12/2018  . Syncope and collapse 07/14/2017  . Urinary tract infection 07/14/2017  . Mitral regurgitation 07/13/2017  . Syncope 07/13/2017  . Acute lower UTI 07/13/2017  . AAA (abdominal aortic aneurysm) without rupture ([place]) 05/14/2017  . Community acquired pneumonia 05/13/2017  . Gross hematuria 05/13/2017  . Elevated LFTs 05/13/2017  . Hyperbilirubinemia 05/13/2017  .  Pain in joint of left shoulder 05/13/2017  . S/P CABG (coronary artery bypass graft) 06/08/2014  . Orthostatic hypotension 06/08/2014  . SOB (shortness of breath) 05/17/2014  . Eye infection 05/17/2014  . CAD (coronary artery disease) 04/28/2014  . Cardiomyopathy, ischemic 04/25/2014  . S/P CABG x 3 04/24/2014  . Cardiomyopathy of undetermined type ([place]) 04/19/2014  . Systolic CHF with reduced left ventricular function, NYHA class 2 ([place]) 04/19/2014  . Dyspnea on exertion 04/10/2014  . Murmur 04/10/2014  . Cardiomegaly 04/10/2014  . Coronary artery calcification seen on CAT scan 04/10/2014  . GERD with stricture 04/09/2012    Past Surgical History:  Procedure Laterality Date  . APPENDECTOMY    . CARDIAC SURGERY    . CARDIOVERSION N/A 05/19/2017   Procedure: CARDIOVERSION;  Surgeon: Larey Dresser, MD;  Location[facility] ENDOSCOPY;  Service: Cardiovascular;  Laterality: N/A;  . CHOLECYSTECTOMY    . CORONARY ARTERY BYPASS GRAFT N/A 04/24/2014   Procedure: CORONARY ARTERY BYPASS GRAFTING (CABG);  Surgeon: Gaye Pollack, MD;  Location: [facility];  Service: Open Heart Surgery;  Laterality: N/A;  Times 3 using left internal mammary artery and endoscopically harvested right saphenous vein  . ERCP N/A 11/13/2017   Procedure: ENDOSCOPIC RETROGRADE CHOLANGIOPANCREATOGRAPHY (ERCP);  Surgeon: Clarene Essex, MD;  Location: [facility] ENDOSCOPY;  Service: Endoscopy;  Laterality: N/A;  . ESOPHAGOGASTRODUODENOSCOPY  04/09/2012   Procedure: ESOPHAGOGASTRODUODENOSCOPY (EGD);  Surgeon: Gatha Mayer, MD;  Location: [facility] ENDOSCOPY;  Service: Endoscopy;  Laterality: N/A;  . INTRAOPERATIVE TRANSESOPHAGEAL ECHOCARDIOGRAM N/A 04/24/2014   Procedure: INTRAOPERATIVE TRANSESOPHAGEAL ECHOCARDIOGRAM;  Surgeon: Gaye Pollack, MD;  Location: [facility] OR;  Service: Open Heart Surgery;  Laterality: N/A;  . LEFT AND RIGHT HEART CATHETERIZATION WITH CORONARY ANGIOGRAM N/A 04/21/2014   Procedure: LEFT AND RIGHT HEART CATHETERIZATION WITH CORONARY ANGIOGRAM;  Surgeon: Burnell Blanks, MD;  Location: [facility] CATH LAB;  Service: Cardiovascular;  Laterality: N/A;  . SHOULDER SURGERY    . TEE WITHOUT CARDIOVERSION N/A 05/19/2017   Procedure: TRANSESOPHAGEAL ECHOCARDIOGRAM (TEE);  Surgeon: Larey Dresser, MD;  Location: [facility] ENDOSCOPY;  Service: Cardiovascular;  Laterality: N/A;        Home Medications    Prior to Admission medications   Medication Sig Start Date End Date Taking? Authorizing Provider  acetaminophen (TYLENOL) 500 MG tablet Take 500-1,000 mg by mouth 2 (two) times daily as needed for moderate pain.    Yes [provider]  amiodarone (PACERONE) 200 MG tablet Take 1 tablet (200 mg total) by mouth daily. 09/21/17  Yes Croitoru, Mihai, MD  amoxicillin (AMOXIL) 875 MG tablet Take 875 mg by mouth 2 (two) times daily. For 10 days 06/10/18  Yes [provider]  apixaban (ELIQUIS) 2.5 MG TABS tablet Take 1 tablet (2.5 mg total) by mouth 2 (two) times daily. 03/09/18  Yes Croitoru, Mihai, MD  atorvastatin (LIPITOR) 80 MG tablet Take 40 mg  by mouth daily.   Yes [provider]  B Complex-C (B-COMPLEX WITH VITAMIN C) tablet Take 1 tablet by mouth every other day.    Yes [provider]  Cholecalciferol (VITAMIN D3) 5000 units TABS Take 5,000 Units by mouth every Monday, Wednesday, and Friday.    Yes [provider]  Droxidopa (NORTHERA) 100 MG CAPS Take 100 mg by mouth 3 (three) times daily. 05/11/18  Yes Croitoru, Mihai, MD  fluticasone (FLONASE) 50 MCG/ACT nasal spray Place 1 spray into both nostrils daily as needed for allergies or rhinitis.   Yes [provider]  folic acid (FOLVITE) 017 MCG tablet Take 400 mcg by mouth daily.   Yes [provider]  furosemide (LASIX) 40 MG tablet Take 0.5 tablets (20 mg total) by mouth daily as needed. FOR WT GAIN >3# IN 1 DAY  -OR- >5# IN A WEEK Patient taking differently: Take 20 mg by mouth daily as needed (for a weight gain of 3 pounds or more in a day or 5 pounds or more in a week).  08/04/17  Yes Barrett, Evelene Croon, PA-C  guaiFENesin (MUCINEX) 600 MG 12 hr tablet Take 1 tablet (600 mg total) by mouth 2 (two) times daily. Patient taking differently: Take 600 mg by mouth daily.  03/08/18  Yes Regalado, Belkys A, MD  ipratropium-albuterol (DUONEB) 0.5-2.5 (3) MG/3ML SOLN Take 3 mLs by nebulization 3 (three) times daily. 03/08/18  Yes Regalado, Belkys A, MD  loratadine (CLARITIN) 10 MG tablet Take 10 mg by mouth daily.    Yes [provider]  Magnesium 400 MG CAPS Take 400 mg by mouth every other day. Patient taking differently: Take 400 mg by mouth every Monday, Wednesday, and Friday.  06/25/17  Yes Croitoru, Mihai, MD  Melatonin 5 MG TABS Take 5-10 mg by mouth at bedtime as needed (for sleep).    Yes [provider]  midodrine (PROAMATINE) 10 MG tablet Take 1 tablet (10 mg total) by mouth 3 (three) times daily with meals. 12/22/17  Yes Croitoru, Mihai, MD  pantoprazole (PROTONIX) 40 MG tablet Take 1 tablet (40 mg total) by mouth 2 (two)  times daily. Patient taking differently: Take 40 mg by mouth daily.  03/08/18  Yes Regalado, Belkys A, MD  polyethylene glycol (MIRALAX / GLYCOLAX) packet Take 17 g by mouth daily as needed for mild constipation. 05/21/17  Yes Bonnielee Haff, MD  potassium chloride SA (K-DUR,KLOR-CON) 20 MEQ tablet Take 1 tablet (20 mEq total) by mouth daily. TAKE WHEN TAKING YOUR LASIX Patient taking differently: Take 20 mEq by mouth daily as needed (only when taking Lasix for 3-5 lb. weight gain).  06/22/17  Yes Croitoru, Mihai, MD  PROAIR HFA 108 [phone] Base) MCG/ACT inhaler Inhale 2 puffs into the lungs every 4-6 hours as needed for shortness of breath or wheezing 03/02/18  Yes [provider]  Probiotic Product (PROBIOTIC PO) Take 1 capsule by mouth daily. 30 BILLION strength   Yes [provider]  amoxicillin-clavulanate (AUGMENTIN) 875-125 MG tablet Take 1 tablet by mouth every 12 (twelve) hours. Patient not taking: Reported on 06/14/2018 03/08/18   Elmarie Shiley, MD  Elastic Bandages & Supports (ABDOMINAL BINDER/ELASTIC MED) MISC Wear daily 02/12/18   Croitoru, Dani Gobble, MD  predniSONE (DELTASONE) 20 MG tablet Take 2 tablet for 2 days then 1 tablet for 2 days then stop. Patient not taking: Reported on 06/14/2018 03/08/18   Regalado, Jerald Kief A, MD  tamsulosin (FLOMAX) 0.4 MG CAPS capsule Take 1 capsule (0.4 mg total) by mouth daily after supper. Patient not taking: Reported on 06/14/2018 05/21/17   Bonnielee Haff, MD    Family History Family History  Problem Relation Age of Onset  . Heart attack Mother   . Kidney disease Father   . Colon cancer Brother   . Dementia Brother   . Aneurysm Brother   . Breast cancer Sister        x 2  . Arthritis Sister   . Osteoporosis Sister     Social History Social History   Tobacco Use  . Smoking status: Never Smoker  .  Smokeless tobacco: Never Used  Substance Use Topics  . Alcohol use: Yes    Alcohol/week: 0.6 oz    Types: 1 Standard drinks or  equivalent per week    Comment: rare  . Drug use: No     Allergies   Other; Septra [sulfamethoxazole-trimethoprim]; Tramadol; and Sulfa antibiotics   Review of Systems Review of Systems  Respiratory: Positive for cough.   Neurological: Positive for weakness.  All other systems reviewed and are negative.    Physical Exam Updated Vital Signs BP (!) 145/64   Pulse 77   Temp 98.9 F (37.2 C) (Axillary)   Resp (!) 29   Ht 5\' 11"  (1.803 m)   Wt 72.6 kg (160 lb)   SpO2 93%   BMI 22.32 kg/m   Physical Exam  Constitutional: He appears well-developed. He appears distressed.  HENT:  Head: Normocephalic and atraumatic.  Right Ear: External ear normal.  Left Ear: External ear normal.  Nose: Nose normal.  Mouth/Throat: Oropharynx is clear and moist.  Eyes: Pupils are equal, round, and reactive to light. Conjunctivae and EOM are normal.  Neck: Normal range of motion. Neck supple.  Cardiovascular: Normal rate, regular rhythm, normal heart sounds and intact distal pulses.  Pulmonary/Chest: Accessory muscle usage present. Tachypnea noted. He is in respiratory distress.  Abdominal: Soft. Bowel sounds are normal.  Musculoskeletal: Normal range of motion.  Neurological: He is alert.  Pt knows his name.  He does not know where he is or what month it is.  He does recognize his daughter.  Skin: Skin is warm. Capillary refill takes less than 2 seconds.  Psychiatric: He has a normal mood and affect. He is agitated.  Nursing note and vitals reviewed.    ED Treatments / Results  Labs (all labs ordered are listed, but only abnormal results are displayed) Labs Reviewed  COMPREHENSIVE METABOLIC PANEL - Abnormal; Notable for the following components:      Result Value   Glucose, Bld 126 (*)    BUN 27 (*)    Creatinine, Ser 1.43 (*)    Calcium 8.2 (*)    Total Protein 4.6 (*)    Albumin 2.3 (*)    AST 76 (*)    ALT 67 (*)    GFR calc non Af Amer 40 (*)    GFR calc Af Amer 46 (*)      All other components within normal limits  CBC WITH DIFFERENTIAL/PLATELET - Abnormal; Notable for the following components:   Hemoglobin 12.3 (*)    Neutro Abs 8.3 (*)    Lymphs Abs 0.6 (*)    All other components within normal limits  I-STAT CG4 LACTIC ACID, ED - Abnormal; Notable for the following components:   Lactic Acid, Venous 2.08 (*)    All other components within normal limits  I-STAT ARTERIAL BLOOD GAS, ED - Abnormal; Notable for the following components:   pH, Arterial 7.467 (*)    pO2, Arterial 67.0 (*)    All other components within normal limits  CULTURE, BLOOD (ROUTINE X 2)  CULTURE, BLOOD (ROUTINE X 2)  CULTURE, BLOOD (ROUTINE X 2)  CULTURE, BLOOD (ROUTINE X 2)  CULTURE, EXPECTORATED SPUTUM-ASSESSMENT  GRAM STAIN  TROPONIN I  STREP PNEUMONIAE URINARY ANTIGEN  BASIC METABOLIC PANEL  CBC WITH DIFFERENTIAL/PLATELET  I-STAT CG4 LACTIC ACID, ED    EKG EKG Interpretation  Date/Time:  Monday June 14 2018 16:56:37 EDT Ventricular Rate:  78 PR Interval:    QRS Duration: 113 QT  Interval:  460 QTC Calculation: 524 R Axis:   14 Text Interpretation:  Sinus rhythm Paired ventricular premature complexes Borderline intraventricular conduction delay ST elevation, consider inferior injury Prolonged QT interval pvcs new Otherwise no significant change Confirmed by Isla Pence [phone]) on 06/14/2018 5:02:08 PM   Radiology Ct Head Wo Contrast  Result Date: 06/14/2018 CLINICAL DATA:  Headache with increased confusion EXAM: CT HEAD WITHOUT CONTRAST TECHNIQUE: Contiguous axial images were obtained from the base of the skull through the vertex without intravenous contrast. COMPARISON:  02/04/2018 head CT FINDINGS: Brain: No acute territorial infarction, hemorrhage, or intracranial mass is visualized. There is moderate atrophy. Minimal small vessel ischemic changes of the white matter. Stable ventricle size. Vascular: No hyperdense vessels.  Carotid vascular calcification. Skull:  Normal. Negative for fracture or focal lesion. Sinuses/Orbits: No acute finding. Chronic appearing nasal bone deformity. Other: None IMPRESSION: 1. No CT evidence for acute intracranial abnormality. 2. Atrophy and mild small vessel ischemic changes of the white matter Electronically Signed   By: Donavan Foil M.D.   On: 06/14/2018 19:53   Dg Chest Port 1 View  Result Date: 06/14/2018 CLINICAL DATA:  Altered mental status EXAM: PORTABLE CHEST 1 VIEW COMPARISON:  Chest radiograph 03/04/2018 FINDINGS: Reticular opacities throughout much of the right lung, worst in the right upper lobe. No pleural effusion. Remote median sternotomy with mild cardiomegaly. IMPRESSION: Reticular opacities, greatest in the right upper lobe, concerning for pneumonia or aspiration. Electronically Signed   By: Ulyses Jarred M.D.   On: 06/14/2018 18:05    Procedures Procedures (including critical care time)  Medications Ordered in ED Medications  amiodarone (PACERONE) tablet 200 mg (has no administration in time range)  apixaban (ELIQUIS) tablet 2.5 mg (has no administration in time range)  atorvastatin (LIPITOR) tablet 40 mg (has no administration in time range)  Droxidopa CAPS 100 mg (has no administration in time range)  furosemide (LASIX) tablet 20 mg (has no administration in time range)  ipratropium-albuterol (DUONEB) 0.5-2.5 (3) MG/3ML nebulizer solution 3 mL (has no administration in time range)  Melatonin TABS 5-10 mg (has no administration in time range)  midodrine (PROAMATINE) tablet 10 mg (has no administration in time range)  albuterol (PROVENTIL) (2.5 MG/3ML) 0.083% nebulizer solution 2.5 mg (has no administration in time range)  sodium chloride flush (NS) 0.9 % injection 3 mL (has no administration in time range)  sodium chloride flush (NS) 0.9 % injection 3 mL (has no administration in time range)  0.9 %  sodium chloride infusion (has no administration in time range)     Initial Impression /  Assessment and Plan / ED Course  I have reviewed the triage vital signs and the nursing notes.  Pertinent labs & imaging results that were available during my care of the patient were reviewed by me and considered in my medical decision making (see chart for details).    Pt is not given sepsis fluids (30 cc/kg) as he has severe CHF with EF 25-30%.  Pt given IV rocephin/zithromax.  BP and HR improved.  CRITICAL CARE Performed by: Isla Pence   Total critical care time: 30 minutes  Critical care time was exclusive of separately billable procedures and treating other patients.  Critical care was necessary to treat or prevent imminent or life-threatening deterioration.  Critical care was time spent personally by me on the following activities: development of treatment plan with patient and/or surrogate as well as nursing, discussions with consultants, evaluation of patient's response to treatment, examination of  patient, obtaining history from patient or surrogate, ordering and performing treatments and interventions, ordering and review of laboratory studies, ordering and review of radiographic studies, pulse oximetry and re-evaluation of patient's condition.  Pt d/w Dr. Shanon Brow (triad) for admission.  Final Clinical Impressions(s) / ED Diagnoses   Final diagnoses:  Sepsis, due to unspecified organism [facility])  Community acquired pneumonia of right upper lobe of lung ([place])  Metabolic encephalopathy  Fall, initial encounter  Contusion of scalp, initial encounter  Acute on chronic respiratory failure with hypoxia (HCC)  CKD (chronic kidney disease) stage 3, GFR 30-59 ml/min [facility])    ED Discharge Orders    None       Isla Pence, MD 06/14/18 2038

## 2018-06-14 NOTE — Progress Notes (Signed)
Pharmacy Antibiotic Note  Bruce Green is a 84 y.o. male admitted on 06/14/2018 with pneumonia.  Pharmacy has been consulted for vancomycin and zosyn dosing.  Presented with increased confusion, gait abnormalities, and SOB since last week. Of note, was started on amoxicillin last week for SOB. WBC 10, LA 1.43, Scr 1.43. Afebrile. Received 2 g ceftriaxone on 7/22 at 1748. CXR showing reticular opacities concerning for PNA or aspiration. Did recently fall and hit head (on apixaban), with CT head showing no acute abnormalities today.   Plan: Vancomycin 1500 mg IV once  Vancomycin 1000 mg IV every 24 hours.  Goal trough 15-20 mcg/mL. Zosyn 3.375g IV q8h (4 hour infusion). Monitor renal function, cx results, clinical pic, and VT as indicated  Height: 5\' 11"  (180.3 cm) Weight: 160 lb (72.6 kg) IBW/kg (Calculated) : 75.3  Temp (24hrs), Avg:98.9 F (37.2 C), Min:98.9 F (37.2 C), Max:98.9 F (37.2 C)  Recent Labs  Lab 06/14/18 1710 06/14/18 1740 06/14/18 1932  WBC 10.0  --   --   CREATININE 1.43*  --   --   LATICACIDVEN  --  2.08* 1.43    Estimated Creatinine Clearance: 31 mL/min (A) (by C-G formula based on SCr of 1.43 mg/dL (H)).    Allergies  Allergen Reactions  . Other Other (See Comments)    General anesthesia - sensitivity,  causes severe lethargy   . Septra [Sulfamethoxazole-Trimethoprim] Hives  . Tramadol Other (See Comments)    Caused low b/p   . Sulfa Antibiotics Rash    Antimicrobials this admission: Ceftriaxone 7/22 x1 Vancomycin 7/22 >>  Zosyn 7/22 >>   Dose adjustments this admission: N/A  Microbiology results: 7/22 BCx: sent 7/22 UCx: sent  7/22 Sputum: sent   Thank you for allowing pharmacy to be a part of this patient's care.  Doylene Canard, PharmD Clinical Pharmacist  Pager: [phone] Phone: [phone] 06/14/2018 8:40 PM

## 2018-06-14 NOTE — H&P (Signed)
History and Physical    Bruce Green:[id number] DOB: 1919/06/21 DOA: 06/14/2018  PCP: Kathyrn Lass, MD  Patient coming from:  home  Chief Complaint:  sob  HPI: Bruce Green is a 84 y.o. male with medical history significant of chronic kidney disease, hypertension lives at home was started on amoxicillin by his primary care physician several days ago brought in because of weakness and shortness of breath.  Patient chronically on 2 L of oxygen at home.  His O2 sats were low at home he is now requiring 4 L of oxygen and satting in the mid 90s.  He has been confused more than normal.  He has no history of dementia.  He has been coughing a lot.  He denies any swelling.  He denies any signs of aspiration.  Patient is being referred for admission for pneumonia with acute hypoxic respiratory failure.  He was hospitalized back in April of this year for possible aspiration pneumonia and was treated with Augmentin.  Review of Systems: As per HPI otherwise 10 point review of systems negative.   Past Medical History:  Diagnosis Date  . Anxiety   . Cataract   . CKD (chronic kidney disease)   . Colon polyp   . Coronary artery disease 04/21/2014     severe triple vessel  . Dyslipidemia   . GERD (gastroesophageal reflux disease)   . Hiatal hernia    6cm  . Hypertension   . Ischemic cardiomyopathy 03/2014   EF 25-30%  . Pneumonia   . Stricture esophagus    distal  . TIA (transient ischemic attack)   . Vitamin D deficiency     Past Surgical History:  Procedure Laterality Date  . APPENDECTOMY    . CARDIAC SURGERY    . CARDIOVERSION N/A 05/19/2017   Procedure: CARDIOVERSION;  Surgeon: Larey Dresser, MD;  Location: [facility] ENDOSCOPY;  Service: Cardiovascular;  Laterality: N/A;  . CHOLECYSTECTOMY    . CORONARY ARTERY BYPASS GRAFT N/A 04/24/2014   Procedure: CORONARY ARTERY BYPASS GRAFTING (CABG);  Surgeon: Gaye Pollack, MD;  Location: [place];  Service: Open Heart Surgery;  Laterality: N/A;   Times 3 using left internal mammary artery and endoscopically harvested right saphenous vein  . ERCP N/A 11/13/2017   Procedure: ENDOSCOPIC RETROGRADE CHOLANGIOPANCREATOGRAPHY (ERCP);  Surgeon: Clarene Essex, MD;  Location: [facility];  Service: Endoscopy;  Laterality: N/A;  . ESOPHAGOGASTRODUODENOSCOPY  04/09/2012   Procedure: ESOPHAGOGASTRODUODENOSCOPY (EGD);  Surgeon: Gatha Mayer, MD;  Location: [facility]OS[facility];  Service: Endoscopy;  Laterality: N/A;  . INTRAOPERATIVE TRANSESOPHAGEAL ECHOCARDIOGRAM N/A 04/24/2014   Procedure: INTRAOPERATIVE TRANSESOPHAGEAL ECHOCARDIOGRAM;  Surgeon: Gaye Pollack, MD;  Location: [place] [facility];  Service: Open Heart Surgery;  Laterality: N/A;  . LEFT AND RIGHT HEART CATHETERIZATION WITH CORONARY ANGIOGRAM N/A 04/21/2014   Procedure: LEFT AND RIGHT HEART CATHETERIZATION WITH CORONARY ANGIOGRAM;  Surgeon: Burnell Blanks, MD;  Location: [facility] CATH LAB;  Service: Cardiovascular;  Laterality: N/A;  . SHOULDER SURGERY    . TEE WITHOUT CARDIOVERSION N/A 05/19/2017   Procedure: TRANSESOPHAGEAL ECHOCARDIOGRAM (TEE);  Surgeon: Larey Dresser, MD;  Location: [facility] ENDOSCOPY;  Service: Cardiovascular;  Laterality: N/A;     reports that he has never smoked. He has never used smokeless tobacco. He reports that he drinks about 0.6 oz of alcohol per week. He reports that he does not use drugs.  Allergies  Allergen Reactions  . Other Other (See Comments)    General anesthesia - sensitivity,  causes severe  lethargy   . Septra [Sulfamethoxazole-Trimethoprim] Hives  . Tramadol Other (See Comments)    Caused low b/p   . Sulfa Antibiotics Rash    Family History  Problem Relation Age of Onset  . Heart attack Mother   . Kidney disease Father   . Colon cancer Brother   . Dementia Brother   . Aneurysm Brother   . Breast cancer Sister        x 2  . Arthritis Sister   . Osteoporosis Sister     Prior to Admission medications   Medication Sig Start Date End Date Taking?  Authorizing Provider  acetaminophen (TYLENOL) 500 MG tablet Take 500-1,000 mg by mouth 2 (two) times daily as needed for moderate pain.    Yes [provider]  amiodarone (PACERONE) 200 MG tablet Take 1 tablet (200 mg total) by mouth daily. 09/21/17  Yes Croitoru, Mihai, MD  amoxicillin (AMOXIL) 875 MG tablet Take 875 mg by mouth 2 (two) times daily. For 10 days 06/10/18  Yes [provider]  apixaban (ELIQUIS) 2.5 MG TABS tablet Take 1 tablet (2.5 mg total) by mouth 2 (two) times daily. 03/09/18  Yes Croitoru, Mihai, MD  atorvastatin (LIPITOR) 80 MG tablet Take 40 mg by mouth daily.   Yes [provider]  B Complex-C (B-COMPLEX WITH VITAMIN C) tablet Take 1 tablet by mouth every other day.    Yes [provider]  Cholecalciferol (VITAMIN D3) 5000 units TABS Take 5,000 Units by mouth every Monday, Wednesday, and Friday.    Yes [provider]  Droxidopa (NORTHERA) 100 MG CAPS Take 100 mg by mouth 3 (three) times daily. 05/11/18  Yes Croitoru, Mihai, MD  fluticasone (FLONASE) 50 MCG/ACT nasal spray Place 1 spray into both nostrils daily as needed for allergies or rhinitis.   Yes [provider]  folic acid (FOLVITE) 767 MCG tablet Take 400 mcg by mouth daily.   Yes [provider]  furosemide (LASIX) 40 MG tablet Take 0.5 tablets (20 mg total) by mouth daily as needed. FOR WT GAIN >3# IN 1 DAY  -OR- >5# IN A WEEK Patient taking differently: Take 20 mg by mouth daily as needed (for a weight gain of 3 pounds or more in a day or 5 pounds or more in a week).  08/04/17  Yes Barrett, Evelene Croon, PA-C  guaiFENesin (MUCINEX) 600 MG 12 hr tablet Take 1 tablet (600 mg total) by mouth 2 (two) times daily. Patient taking differently: Take 600 mg by mouth daily.  03/08/18  Yes Regalado, Belkys A, MD  ipratropium-albuterol (DUONEB) 0.5-2.5 (3) MG/3ML SOLN Take 3 mLs by nebulization 3 (three) times daily. 03/08/18  Yes Regalado, Belkys A, MD  loratadine  (CLARITIN) 10 MG tablet Take 10 mg by mouth daily.    Yes [provider]  Magnesium 400 MG CAPS Take 400 mg by mouth every other day. Patient taking differently: Take 400 mg by mouth every Monday, Wednesday, and Friday.  06/25/17  Yes Croitoru, Mihai, MD  Melatonin 5 MG TABS Take 5-10 mg by mouth at bedtime as needed (for sleep).    Yes [provider]  midodrine (PROAMATINE) 10 MG tablet Take 1 tablet (10 mg total) by mouth 3 (three) times daily with meals. 12/22/17  Yes Croitoru, Mihai, MD  pantoprazole (PROTONIX) 40 MG tablet Take 1 tablet (40 mg total) by mouth 2 (two) times daily. Patient taking differently: Take 40 mg by mouth daily.  03/08/18  Yes Regalado, Belkys A,  MD  polyethylene glycol (MIRALAX / GLYCOLAX) packet Take 17 g by mouth daily as needed for mild constipation. 05/21/17  Yes Bonnielee Haff, MD  potassium chloride SA (K-DUR,KLOR-CON) 20 MEQ tablet Take 1 tablet (20 mEq total) by mouth daily. TAKE WHEN TAKING YOUR LASIX Patient taking differently: Take 20 mEq by mouth daily as needed (only when taking Lasix for 3-5 lb. weight gain).  06/22/17  Yes Croitoru, Mihai, MD  PROAIR HFA 108 [phone] Base) MCG/ACT inhaler Inhale 2 puffs into the lungs every 4-6 hours as needed for shortness of breath or wheezing 03/02/18  Yes [provider]  Probiotic Product (PROBIOTIC PO) Take 1 capsule by mouth daily. 30 BILLION strength   Yes [provider]  amoxicillin-clavulanate (AUGMENTIN) 875-125 MG tablet Take 1 tablet by mouth every 12 (twelve) hours. Patient not taking: Reported on 06/14/2018 03/08/18   Elmarie Shiley, MD  Elastic Bandages & Supports (ABDOMINAL BINDER/ELASTIC MED) MISC Wear daily 02/12/18   Croitoru, Dani Gobble, MD  predniSONE (DELTASONE) 20 MG tablet Take 2 tablet for 2 days then 1 tablet for 2 days then stop. Patient not taking: Reported on 06/14/2018 03/08/18   Regalado, Jerald Kief A, MD  tamsulosin (FLOMAX) 0.4 MG CAPS capsule Take 1 capsule (0.4 mg total)  by mouth daily after supper. Patient not taking: Reported on 06/14/2018 05/21/17   Bonnielee Haff, MD    Physical Exam: Vitals:   06/14/18 1815 06/14/18 1830 06/14/18 1845 06/14/18 1915  BP: (!) 152/70 (!) 142/75 (!) 151/69 (!) 145/64  Pulse: 77 77 78 77  Resp: (!) 29 (!) 29  (!) 29  Temp:      TempSrc:      SpO2: 94% 94% 94% 93%  Weight:      Height:          Constitutional: NAD, calm, comfortable Vitals:   06/14/18 1815 06/14/18 1830 06/14/18 1845 06/14/18 1915  BP: (!) 152/70 (!) 142/75 (!) 151/69 (!) 145/64  Pulse: 77 77 78 77  Resp: (!) 29 (!) 29  (!) 29  Temp:      TempSrc:      SpO2: 94% 94% 94% 93%  Weight:      Height:       Eyes: PERRL, lids and conjunctivae normal ENMT: Mucous membranes are moist. Posterior pharynx clear of any exudate or lesions.Normal dentition.  Neck: normal, supple, no masses, no thyromegaly Respiratory: clear to auscultation bilaterally, no wheezing, no crackles. Normal respiratory effort. No accessory muscle use.  Cardiovascular: Regular rate and rhythm, no murmurs / rubs / gallops. No extremity edema. 2+ pedal pulses. No carotid bruits.  Abdomen: no tenderness, no masses palpated. No hepatosplenomegaly. Bowel sounds positive.  Musculoskeletal: no clubbing / cyanosis. No joint deformity upper and lower extremities. Good ROM, no contractures. Normal muscle tone.  Skin: no rashes, lesions, ulcers. No induration Neurologic: CN 2-12 grossly intact. Sensation intact, DTR normal. Strength 5/5 in all 4.  Psychiatric: Normal judgment and insight. Alert and oriented x 3. Normal mood.    Labs on Admission: I have personally reviewed following labs and imaging studies  CBC: Recent Labs  Lab 06/14/18 1710  WBC 10.0  NEUTROABS 8.3*  HGB 12.3*  HCT 39.7  MCV 88.6  PLT 287   Basic Metabolic Panel: Recent Labs  Lab 06/14/18 1710  NA 143  K 4.0  CL 108  CO2 25  GLUCOSE 126*  BUN 27*  CREATININE 1.43*  CALCIUM 8.2*   GFR: Estimated  Creatinine Clearance: 31 mL/min (A) (  by C-G formula based on SCr of 1.43 mg/dL (H)). Liver Function Tests: Recent Labs  Lab 06/14/18 1710  AST 76*  ALT 67*  ALKPHOS 62  BILITOT 0.7  PROT 4.6*  ALBUMIN 2.3*   No results for input(s): LIPASE, AMYLASE in the last 168 hours. No results for input(s): AMMONIA in the last 168 hours. Coagulation Profile: No results for input(s): INR, PROTIME in the last 168 hours. Cardiac Enzymes: Recent Labs  Lab 06/14/18 1710  TROPONINI <0.03   BNP (last 3 results) No results for input(s): PROBNP in the last 8760 hours. HbA1C: No results for input(s): HGBA1C in the last 72 hours. CBG: No results for input(s): GLUCAP in the last 168 hours. Lipid Profile: No results for input(s): CHOL, HDL, LDLCALC, TRIG, CHOLHDL, LDLDIRECT in the last 72 hours. Thyroid Function Tests: No results for input(s): TSH, T4TOTAL, FREET4, T3FREE, THYROIDAB in the last 72 hours. Anemia Panel: No results for input(s): VITAMINB12, FOLATE, FERRITIN, TIBC, IRON, RETICCTPCT in the last 72 hours. Urine analysis:    Component Value Date/Time   COLORURINE YELLOW 07/13/2017 1740   APPEARANCEUR HAZY (A) 07/13/2017 1740   LABSPEC 1.008 07/13/2017 1740   PHURINE 6.0 07/13/2017 1740   GLUCOSEU NEGATIVE 07/13/2017 1740   HGBUR NEGATIVE 07/13/2017 1740   BILIRUBINUR NEGATIVE 07/13/2017 1740   KETONESUR NEGATIVE 07/13/2017 1740   PROTEINUR NEGATIVE 07/13/2017 1740   UROBILINOGEN 1.0 04/24/2014 0108   NITRITE NEGATIVE 07/13/2017 1740   LEUKOCYTESUR LARGE (A) 07/13/2017 1740   Sepsis Labs: !!!!!!!!!!!!!!!!!!!!!!!!!!!!!!!!!!!!!!!!!!!! @LABRCNTIP (procalcitonin:4,lacticidven:4) )No results found for this or any previous visit (from the past 240 hour(s)).   Radiological Exams on Admission: Ct Head Wo Contrast  Result Date: 06/14/2018 CLINICAL DATA:  Headache with increased confusion EXAM: CT HEAD WITHOUT CONTRAST TECHNIQUE: Contiguous axial images were obtained from the base of  the skull through the vertex without intravenous contrast. COMPARISON:  02/04/2018 head CT FINDINGS: Brain: No acute territorial infarction, hemorrhage, or intracranial mass is visualized. There is moderate atrophy. Minimal small vessel ischemic changes of the white matter. Stable ventricle size. Vascular: No hyperdense vessels.  Carotid vascular calcification. Skull: Normal. Negative for fracture or focal lesion. Sinuses/Orbits: No acute finding. Chronic appearing nasal bone deformity. Other: None IMPRESSION: 1. No CT evidence for acute intracranial abnormality. 2. Atrophy and mild small vessel ischemic changes of the white matter Electronically Signed   By: Donavan Foil M.D.   On: 06/14/2018 19:53   Dg Chest Port 1 View  Result Date: 06/14/2018 CLINICAL DATA:  Altered mental status EXAM: PORTABLE CHEST 1 VIEW COMPARISON:  Chest radiograph 03/04/2018 FINDINGS: Reticular opacities throughout much of the right lung, worst in the right upper lobe. No pleural effusion. Remote median sternotomy with mild cardiomegaly. IMPRESSION: Reticular opacities, greatest in the right upper lobe, concerning for pneumonia or aspiration. Electronically Signed   By: Ulyses Jarred M.D.   On: 06/14/2018 18:05    Old chart reviewed cxr reviewed rul infiltrate possible Case discussed with dr Gilford Raid in the ED   Assessment/Plan 84 year old male with acute on chronic hypoxic respiratory failure and pneumonia Principal Problem:   PNA (pneumonia)-high risk for aspiration.  Placed on IV vancomycin and Zosyn.  Treated with Augmentin back in April.  Currently has been on amoxicillin.  Follow-up on culture data.  Obtain blood and sputum cultures.  Active Problems:   Acute on chronic respiratory failure with hypoxia (HCC) baseline on 2 L of oxygen currently on 4-with no respiratory distress and good O2 sats    Systolic  CHF with reduced left ventricular function, NYHA class 2 (HCC) stable continue home Lasix doses-    CAD  (coronary artery disease) stable-    S/P CABG (coronary artery bypass graft)-noted     AAA (abdominal aortic aneurysm) without rupture ([place])- noted in stable    Atrial fibrillation (HCC) currently rate controlled-     DVT prophylaxis: scds Code Status:  full Family Communication:  daughter Disposition Plan:  Likely 1-2 days Consults called:  none Admission status:  observation   DAVID,RACHAL A MD [facility]  If 7PM-7AM, please contact night-coverage [email or web address] Password [facility]  06/14/2018, 8:22 PM

## 2018-06-14 NOTE — Plan of Care (Signed)

## 2018-06-14 NOTE — ED Notes (Signed)
Lab results reported to Nurse Chystal.

## 2018-06-15 ENCOUNTER — Observation Stay (HOSPITAL_COMMUNITY): Payer: Medicare Other

## 2018-06-15 DIAGNOSIS — Z9981 Dependence on supplemental oxygen: Secondary | ICD-10-CM | POA: Diagnosis not present

## 2018-06-15 DIAGNOSIS — W19XXXA Unspecified fall, initial encounter: Secondary | ICD-10-CM | POA: Diagnosis not present

## 2018-06-15 DIAGNOSIS — I130 Hypertensive heart and chronic kidney disease with heart failure and stage 1 through stage 4 chronic kidney disease, or unspecified chronic kidney disease: Secondary | ICD-10-CM | POA: Diagnosis present

## 2018-06-15 DIAGNOSIS — I5022 Chronic systolic (congestive) heart failure: Secondary | ICD-10-CM | POA: Diagnosis present

## 2018-06-15 DIAGNOSIS — I714 Abdominal aortic aneurysm, without rupture: Secondary | ICD-10-CM | POA: Diagnosis present

## 2018-06-15 DIAGNOSIS — F0390 Unspecified dementia without behavioral disturbance: Secondary | ICD-10-CM | POA: Diagnosis present

## 2018-06-15 DIAGNOSIS — I480 Paroxysmal atrial fibrillation: Secondary | ICD-10-CM | POA: Diagnosis not present

## 2018-06-15 DIAGNOSIS — I340 Nonrheumatic mitral (valve) insufficiency: Secondary | ICD-10-CM | POA: Diagnosis present

## 2018-06-15 DIAGNOSIS — J690 Pneumonitis due to inhalation of food and vomit: Secondary | ICD-10-CM | POA: Diagnosis present

## 2018-06-15 DIAGNOSIS — E785 Hyperlipidemia, unspecified: Secondary | ICD-10-CM | POA: Diagnosis present

## 2018-06-15 DIAGNOSIS — F05 Delirium due to known physiological condition: Secondary | ICD-10-CM | POA: Diagnosis present

## 2018-06-15 DIAGNOSIS — I951 Orthostatic hypotension: Secondary | ICD-10-CM | POA: Diagnosis present

## 2018-06-15 DIAGNOSIS — A419 Sepsis, unspecified organism: Secondary | ICD-10-CM | POA: Diagnosis not present

## 2018-06-15 DIAGNOSIS — Z951 Presence of aortocoronary bypass graft: Secondary | ICD-10-CM | POA: Diagnosis not present

## 2018-06-15 DIAGNOSIS — Z800 Family history of malignant neoplasm of digestive organs: Secondary | ICD-10-CM | POA: Diagnosis not present

## 2018-06-15 DIAGNOSIS — J189 Pneumonia, unspecified organism: Secondary | ICD-10-CM | POA: Diagnosis not present

## 2018-06-15 DIAGNOSIS — R0600 Dyspnea, unspecified: Secondary | ICD-10-CM | POA: Diagnosis not present

## 2018-06-15 DIAGNOSIS — I2510 Atherosclerotic heart disease of native coronary artery without angina pectoris: Secondary | ICD-10-CM | POA: Diagnosis present

## 2018-06-15 DIAGNOSIS — Z515 Encounter for palliative care: Secondary | ICD-10-CM | POA: Diagnosis not present

## 2018-06-15 DIAGNOSIS — N183 Chronic kidney disease, stage 3 (moderate): Secondary | ICD-10-CM | POA: Diagnosis present

## 2018-06-15 DIAGNOSIS — Z66 Do not resuscitate: Secondary | ICD-10-CM | POA: Diagnosis not present

## 2018-06-15 DIAGNOSIS — I255 Ischemic cardiomyopathy: Secondary | ICD-10-CM | POA: Diagnosis present

## 2018-06-15 DIAGNOSIS — J181 Lobar pneumonia, unspecified organism: Secondary | ICD-10-CM | POA: Diagnosis not present

## 2018-06-15 DIAGNOSIS — J9621 Acute and chronic respiratory failure with hypoxia: Secondary | ICD-10-CM | POA: Diagnosis not present

## 2018-06-15 DIAGNOSIS — S0003XA Contusion of scalp, initial encounter: Secondary | ICD-10-CM | POA: Diagnosis present

## 2018-06-15 DIAGNOSIS — Z8673 Personal history of transient ischemic attack (TIA), and cerebral infarction without residual deficits: Secondary | ICD-10-CM | POA: Diagnosis not present

## 2018-06-15 DIAGNOSIS — G9341 Metabolic encephalopathy: Secondary | ICD-10-CM | POA: Diagnosis present

## 2018-06-15 DIAGNOSIS — Z803 Family history of malignant neoplasm of breast: Secondary | ICD-10-CM | POA: Diagnosis not present

## 2018-06-15 LAB — CBC WITH DIFFERENTIAL/PLATELET
Abs Immature Granulocytes: 0.1 10*3/uL (ref 0.0–0.1)
Basophils Absolute: 0 10*3/uL (ref 0.0–0.1)
Basophils Relative: 0 %
EOS ABS: 0.2 10*3/uL (ref 0.0–0.7)
EOS PCT: 2 %
HEMATOCRIT: 38 % — AB (ref 39.0–52.0)
Hemoglobin: 11.5 g/dL — ABNORMAL LOW (ref 13.0–17.0)
Immature Granulocytes: 1 %
Lymphocytes Relative: 10 %
Lymphs Abs: 1 10*3/uL (ref 0.7–4.0)
MCH: 27.1 pg (ref 26.0–34.0)
MCHC: 30.3 g/dL (ref 30.0–36.0)
MCV: 89.4 fL (ref 78.0–100.0)
MONO ABS: 0.8 10*3/uL (ref 0.1–1.0)
Monocytes Relative: 8 %
Neutro Abs: 8 10*3/uL — ABNORMAL HIGH (ref 1.7–7.7)
Neutrophils Relative %: 79 %
Platelets: 326 10*3/uL (ref 150–400)
RBC: 4.25 MIL/uL (ref 4.22–5.81)
RDW: 14.7 % (ref 11.5–15.5)
WBC: 10.1 10*3/uL (ref 4.0–10.5)

## 2018-06-15 LAB — ECHOCARDIOGRAM COMPLETE
Height: 71 in
Weight: 2560 oz

## 2018-06-15 LAB — MRSA PCR SCREENING: MRSA by PCR: POSITIVE — AB

## 2018-06-15 LAB — BASIC METABOLIC PANEL
Anion gap: 11 (ref 5–15)
BUN: 25 mg/dL — AB (ref 8–23)
CO2: 25 mmol/L (ref 22–32)
CREATININE: 1.27 mg/dL — AB (ref 0.61–1.24)
Calcium: 8.1 mg/dL — ABNORMAL LOW (ref 8.9–10.3)
Chloride: 108 mmol/L (ref 98–111)
GFR calc Af Amer: 53 mL/min — ABNORMAL LOW (ref >60)
GFR calc non Af Amer: 46 mL/min — ABNORMAL LOW (ref >60)
GLUCOSE: 150 mg/dL — AB (ref 70–99)
Potassium: 3.6 mmol/L (ref 3.5–5.1)
SODIUM: 144 mmol/L (ref 135–145)

## 2018-06-15 LAB — STREP PNEUMONIAE URINARY ANTIGEN: Strep Pneumo Urinary Antigen: NEGATIVE

## 2018-06-15 LAB — BRAIN NATRIURETIC PEPTIDE: B Natriuretic Peptide: 290.8 pg/mL — ABNORMAL HIGH (ref 0.0–100.0)

## 2018-06-15 MED ORDER — PERFLUTREN LIPID MICROSPHERE
1.0000 mL | INTRAVENOUS | Status: DC | PRN
Start: 2018-06-15 — End: 2018-06-15
  Administered 2018-06-15: 2 mL via INTRAVENOUS
  Filled 2018-06-15: qty 10

## 2018-06-15 MED ORDER — SODIUM CHLORIDE 0.9 % IV SOLN
2.0000 g | INTRAVENOUS | Status: DC
  Administered 2018-06-15: 2 g via INTRAVENOUS
  Filled 2018-06-15: qty 2

## 2018-06-15 MED ORDER — HALOPERIDOL 0.5 MG PO TABS
0.5000 mg | ORAL_TABLET | Freq: Every evening | ORAL | Status: DC | PRN
  Administered 2018-06-15: 0.5 mg via ORAL
  Filled 2018-06-15 (×2): qty 1

## 2018-06-15 MED ORDER — SODIUM CHLORIDE 0.9 % IV SOLN
100.0000 mg | Freq: Two times a day (BID) | INTRAVENOUS | Status: DC
  Administered 2018-06-15: 100 mg via INTRAVENOUS
  Filled 2018-06-15 (×2): qty 100

## 2018-06-15 MED ORDER — SODIUM CHLORIDE 0.9 % IV SOLN
2.0000 g | INTRAVENOUS | Status: DC
  Administered 2018-06-16: 2 g via INTRAVENOUS
  Filled 2018-06-15 (×3): qty 2

## 2018-06-15 MED ORDER — IPRATROPIUM-ALBUTEROL 0.5-2.5 (3) MG/3ML IN SOLN
3.0000 mL | Freq: Three times a day (TID) | RESPIRATORY_TRACT | Status: DC
  Administered 2018-06-15 – 2018-06-18 (×7): 3 mL via RESPIRATORY_TRACT
  Filled 2018-06-15 (×8): qty 3

## 2018-06-15 MED ORDER — DOXYCYCLINE HYCLATE 100 MG IV SOLR
100.0000 mg | Freq: Two times a day (BID) | INTRAVENOUS | Status: DC
  Administered 2018-06-16 – 2018-06-17 (×3): 100 mg via INTRAVENOUS
  Filled 2018-06-15 (×6): qty 100

## 2018-06-15 NOTE — Progress Notes (Signed)
Pt confused, oriented to self, tried to pull out IV and nasal cannula, tried to get out of bed. Administered PRN melatonin, turned off lights and tv. Pt was able to sleep. Caregiver at bedside.

## 2018-06-15 NOTE — Progress Notes (Signed)
PROGRESS NOTE    Bruce Green  EXH:[id number] DOB: [birth year] DOA: 06/14/2018 PCP: Kathyrn Lass, MD      Brief Narrative:  Bruce Green is a 84 y.o. M with hx CKD baseline Cr 1.3, chronic respiratory failure from MR and CHF EF 25%, THN and Afib on Eliquis who presents with 3 months progressive functional decline, and now several days new worse SOB, increased hypoxia and weakness.     Assessment & Plan:  Sepsis from community acquired pneumonia of the RIGHT upper lobe Had aspiration pneumonia in April, had esophagram at that time that showed stricture, no further work up recmomended.  -Continue vancomycin -MRSA nasal swab and discontinue vanc if negative -Change Zosyn to Cefepime -Add doxycycline for atypical coverage -SLP consult re: aspiration again  Altered mental status Delirium At baseline he has no dementia, and is alert and oriented.  Here he is not oriented to situaation, has fluctuating awareness, and is agitated and picking at lines.  CT head normal.  Appears to be a metabolic encephalopathy and delirium in setting of mild cognitive impairment at baseline and now pneumonia. Delirium precautions:   -Lights and TV off, minimize interruptions at night  -Blinds open and lights on during day  -Glasses/hearing aid with patient  -Frequent reorientation  -PT/OT when able  -Avoid sedation medications/Beers list medications  Atrial fibrillation, paroxysmal CHA2DS2-Vasc 6.  On Eliquis -Continue Eliquis -Continue amiodarone  Hypertension Chronic systolic CHF Mitral regurgitation Appears euvolemic. -Obtain echo for update -Hold home furosemide PRN for now -Daily weights -Continue statin  CKD III Baseline Cr 1.4  Orthostatic hypotension -Continue midodrine and droxidopa       DVT prophylaxis: Eliquis, n/a Code Status: FULL Family Communication: None present MDM and disposition Plan: The below labs and imaging reports were reviewed and summarized above.     The patient was admitted with pneumonia in the context of severe chronic CHF, mitral regurgitation and new delirium.  He is persistently delirious today, and still requires greater than his normal O2 levels.  We are broadening IV antibiotics  SLP and PT eval today, likely to SNF in 2-3 days.  Consultants:   None  Procedures:   Echo  Antimicrobials:   Vancomycin 7/22 >>  Zosyn 7/22 >> 7/23  Cefepime 7/23 >>  Doxycycline 7/23 >>    Subjective: Able to wake up to me but agitated, not oriented.  Was very agitated overnight per nursing, puleld out IV.  Still >O2 needs.  Very weak.  Objective: Vitals:   06/14/18 2100 06/14/18 2154 06/14/18 2321 06/15/18 0504  BP:  (!) 144/76  104/62  Pulse: 76 80  74  Resp: (!) 24 20  20   Temp:  98.2 F (36.8 C)  98.5 F (36.9 C)  TempSrc:  Oral  Oral  SpO2: 93% 92% 94% 90%  Weight:      Height:        Intake/Output Summary (Last 24 hours) at 06/15/2018 0912 Last data filed at 06/14/2018 2300 Gross per 24 hour  Intake 261.2 ml  Output -  Net 261.2 ml   Filed Weights   06/14/18 1700  Weight: 72.6 kg (160 lb)    Examination: General appearance: Thin elderly adult male, rouses but not oriented, no acute distress.   HEENT: Anicteric, conjunctiva pink, lids and lashes normal. No nasal deformity, discharge, epistaxis.  Lips moist, teeth normal, OP dry, no oral lesions, hearing normal.   Skin: Warm and dry.  No jaundice.  No suspicious rashes or lesions.  Cardiac: RRR, nl S1-S2, no murmurs appreciated.  Capillary refill is brisk.  JVP not visible.  No LE edema.  Radia  pulses 2+ and symmetric. Respiratory: Tachypneic, on 4L [place].  Repirations somewhat shallow, fast.  Coarse breath sounds bilaterally, diminihsed throughout, no wheezing .   Abdomen: Abdomen soft.  No TTP. No ascites, distension, hepatosplenomegaly.   MSK: No deformities or effusions of the large joints of the upper or lower extermities bilaterally, no clubbing Neuro:  Awake but disoriented.  EOMI, moves all extremities with symmetric but very weak strength. Speech nonsensical but not slurred.    Psych: Sensorium intact and responding to questions, attention diminished. Affect distracted.  Judgment and insight appear poor.    Data Reviewed: I have personally reviewed following labs and imaging studies:  CBC: Recent Labs  Lab 06/14/18 1710  WBC 10.0  NEUTROABS 8.3*  HGB 12.3*  HCT 39.7  MCV 88.6  PLT 735   Basic Metabolic Panel: Recent Labs  Lab 06/14/18 1710  NA 143  K 4.0  CL 108  CO2 25  GLUCOSE 126*  BUN 27*  CREATININE 1.43*  CALCIUM 8.2*   GFR: Estimated Creatinine Clearance: 31 mL/min (A) (by C-G formula based on SCr of 1.43 mg/dL (H)). Liver Function Tests: Recent Labs  Lab 06/14/18 1710  AST 76*  ALT 67*  ALKPHOS 62  BILITOT 0.7  PROT 4.6*  ALBUMIN 2.3*   No results for input(s): LIPASE, AMYLASE in the last 168 hours. No results for input(s): AMMONIA in the last 168 hours. Coagulation Profile: No results for input(s): INR, PROTIME in the last 168 hours. Cardiac Enzymes: Recent Labs  Lab 06/14/18 1710  TROPONINI <0.03   BNP (last 3 results) No results for input(s): PROBNP in the last 8760 hours. HbA1C: No results for input(s): HGBA1C in the last 72 hours. CBG: No results for input(s): GLUCAP in the last 168 hours. Lipid Profile: No results for input(s): CHOL, HDL, LDLCALC, TRIG, CHOLHDL, LDLDIRECT in the last 72 hours. Thyroid Function Tests: No results for input(s): TSH, T4TOTAL, FREET4, T3FREE, THYROIDAB in the last 72 hours. Anemia Panel: No results for input(s): VITAMINB12, FOLATE, FERRITIN, TIBC, IRON, RETICCTPCT in the last 72 hours. Urine analysis:    Component Value Date/Time   COLORURINE YELLOW 07/13/2017 1740   APPEARANCEUR HAZY (A) 07/13/2017 1740   LABSPEC 1.008 07/13/2017 1740   PHURINE 6.0 07/13/2017 1740   GLUCOSEU NEGATIVE 07/13/2017 1740   HGBUR NEGATIVE 07/13/2017 1740   BILIRUBINUR  NEGATIVE 07/13/2017 1740   KETONESUR NEGATIVE 07/13/2017 1740   PROTEINUR NEGATIVE 07/13/2017 1740   UROBILINOGEN 1.0 04/24/2014 0108   NITRITE NEGATIVE 07/13/2017 1740   LEUKOCYTESUR LARGE (A) 07/13/2017 1740   Sepsis Labs: @LABRCNTIP (procalcitonin:4,lacticacidven:4)  )No results found for this or any previous visit (from the past 240 hour(s)).       Radiology Studies: Ct Head Wo Contrast  Result Date: 06/14/2018 CLINICAL DATA:  Headache with increased confusion EXAM: CT HEAD WITHOUT CONTRAST TECHNIQUE: Contiguous axial images were obtained from the base of the skull through the vertex without intravenous contrast. COMPARISON:  02/04/2018 head CT FINDINGS: Brain: No acute territorial infarction, hemorrhage, or intracranial mass is visualized. There is moderate atrophy. Minimal small vessel ischemic changes of the white matter. Stable ventricle size. Vascular: No hyperdense vessels.  Carotid vascular calcification. Skull: Normal. Negative for fracture or focal lesion. Sinuses/Orbits: No acute finding. Chronic appearing nasal bone deformity. Other: None IMPRESSION: 1. No CT evidence for acute intracranial abnormality. 2. Atrophy and mild small  vessel ischemic changes of the white matter Electronically Signed   By: Donavan Foil M.D.   On: 06/14/2018 19:53   Dg Chest Port 1 View  Result Date: 06/14/2018 CLINICAL DATA:  Altered mental status EXAM: PORTABLE CHEST 1 VIEW COMPARISON:  Chest radiograph 03/04/2018 FINDINGS: Reticular opacities throughout much of the right lung, worst in the right upper lobe. No pleural effusion. Remote median sternotomy with mild cardiomegaly. IMPRESSION: Reticular opacities, greatest in the right upper lobe, concerning for pneumonia or aspiration. Electronically Signed   By: Ulyses Jarred M.D.   On: 06/14/2018 18:05        Scheduled Meds: . amiodarone  200 mg Oral Daily  . apixaban  2.5 mg Oral BID  . atorvastatin  40 mg Oral Daily  . Droxidopa  100 mg  Oral TID  . ipratropium-albuterol  3 mL Nebulization TID  . midodrine  10 mg Oral TID WC  . sodium chloride flush  3 mL Intravenous Q12H   Continuous Infusions: . sodium chloride    . ceFEPime (MAXIPIME) IV    . doxycycline (VIBRAMYCIN) IV    . vancomycin       LOS: 0 days    Time spent: 25 minutes    Edwin Dada, MD [facility] 06/15/2018, 9:12 AM     Pager [phone] --- please page though AMION:  [email or web address] Password [email or web address] If 7PM-7AM, please contact night-coverage

## 2018-06-15 NOTE — Evaluation (Signed)
Clinical/Bedside Swallow Evaluation Patient Details  Name: Bruce Green MRN: [id number] Date of Birth: Apr 14, 1919  Today's Date: 06/15/2018 Time: SLP Start Time (ACUTE ONLY): 0951 SLP Stop Time (ACUTE ONLY): 1020 SLP Time Calculation (min) (ACUTE ONLY): 29 min  Past Medical History:  Past Medical History:  Diagnosis Date  . Anxiety   . Cataract   . CKD (chronic kidney disease)   . Colon polyp   . Coronary artery disease 04/21/2014     severe triple vessel  . Dyslipidemia   . GERD (gastroesophageal reflux disease)   . Hiatal hernia    6cm  . Hypertension   . Ischemic cardiomyopathy 03/2014   EF 25-30%  . Pneumonia   . Stricture esophagus    distal  . TIA (transient ischemic attack)   . Vitamin D deficiency    Past Surgical History:  Past Surgical History:  Procedure Laterality Date  . APPENDECTOMY    . CARDIAC SURGERY    . CARDIOVERSION N/A 05/19/2017   Procedure: CARDIOVERSION;  Surgeon: Larey Dresser, MD;  Location: [facility] ENDOSCOPY;  Service: Cardiovascular;  Laterality: N/A;  . CHOLECYSTECTOMY    . CORONARY ARTERY BYPASS GRAFT N/A 04/24/2014   Procedure: CORONARY ARTERY BYPASS GRAFTING (CABG);  Surgeon: Gaye Pollack, MD;  Location: [place];  Service: Open Heart Surgery;  Laterality: N/A;  Times 3 using left internal mammary artery and endoscopically harvested right saphenous vein  . ERCP N/A 11/13/2017   Procedure: ENDOSCOPIC RETROGRADE CHOLANGIOPANCREATOGRAPHY (ERCP);  Surgeon: Clarene Essex, MD;  Location: [facility] ENDOSCOPY;  Service: Endoscopy;  Laterality: N/A;  . ESOPHAGOGASTRODUODENOSCOPY  04/09/2012   Procedure: ESOPHAGOGASTRODUODENOSCOPY (EGD);  Surgeon: Gatha Mayer, MD;  Location: [facility] ENDOSCOPY;  Service: Endoscopy;  Laterality: N/A;  . INTRAOPERATIVE TRANSESOPHAGEAL ECHOCARDIOGRAM N/A 04/24/2014   Procedure: INTRAOPERATIVE TRANSESOPHAGEAL ECHOCARDIOGRAM;  Surgeon: Gaye Pollack, MD;  Location: [facility];  Service: Open Heart Surgery;  Laterality: N/A;  . LEFT AND RIGHT  HEART CATHETERIZATION WITH CORONARY ANGIOGRAM N/A 04/21/2014   Procedure: LEFT AND RIGHT HEART CATHETERIZATION WITH CORONARY ANGIOGRAM;  Surgeon: Burnell Blanks, MD;  Location: [facility] CATH LAB;  Service: Cardiovascular;  Laterality: N/A;  . SHOULDER SURGERY    . TEE WITHOUT CARDIOVERSION N/A 05/19/2017   Procedure: TRANSESOPHAGEAL ECHOCARDIOGRAM (TEE);  Surgeon: Larey Dresser, MD;  Location: [facility] ENDOSCOPY;  Service: Cardiovascular;  Laterality: N/A;   HPI:  Pt is a 84 y.o. M with hx CKD baseline Cr 1.3, chronic respiratory failure from MR and CHF EF 25%, THN and Afib on Eliquis who presents with 3 months progressive functional decline, and now several days new worse SOB, increased hypoxia and weakness. CXR showed RUL PNA. Pt was admitted in April 2019 wtih PNA as well with BSE suggesting functional oropharyngeal swallow. Esophagram at that time showed oropharyngeal swallow with good airway protection but also revealed an esophageal stricture, HH, GER, and mild dysmotility.   Assessment / Plan / Recommendation Clinical Impression  Pt is verbose, needing Mod cues for sustained attention to PO intake and often talking with food in his mouth. Despite this, he does not show any overt signs of aspiration. Given extra time and redirection he clears his mouth well. I suspect his risk for aspiration is largely post-prandial given his hx (stricture, dysmotility, HH, GER), but also shared aspiration precautions and the need to reduce distractions during meals with pt, his daughter, and a caregiver. Would continue with regular diet and thin liquids, crushing any of his larger pills to facilitate esophageal clearance.  SLP will f/u briefly for tolerance and further training in light of recurrent PNA. SLP Visit Diagnosis: Dysphagia, unspecified (R13.10)    Aspiration Risk  Mild aspiration risk    Diet Recommendation Regular;Thin liquid   Liquid Administration via: Cup;Straw Medication Administration: Whole  meds with liquid(crush larger pills) Supervision: Patient able to self feed;Intermittent supervision to cue for compensatory strategies Compensations: Minimize environmental distractions;Slow rate;Small sips/bites;Follow solids with liquid Postural Changes: Seated upright at 90 degrees;Remain upright for at least 30 minutes after po intake    Other  Recommendations Oral Care Recommendations: Oral care BID   Follow up Recommendations 24 hour supervision/assistance      Frequency and Duration min 1 x/week  1 week       Prognosis        Swallow Study   General HPI: Pt is a 84 y.o. M with hx CKD baseline Cr 1.3, chronic respiratory failure from MR and CHF EF 25%, THN and Afib on Eliquis who presents with 3 months progressive functional decline, and now several days new worse SOB, increased hypoxia and weakness. CXR showed RUL PNA. Pt was admitted in April 2019 wtih PNA as well with BSE suggesting functional oropharyngeal swallow. Esophagram at that time showed oropharyngeal swallow with good airway protection but also revealed an esophageal stricture, HH, GER, and mild dysmotility. Type of Study: Bedside Swallow Evaluation Previous Swallow Assessment: see HPI Diet Prior to this Study: Regular;Thin liquids Temperature Spikes Noted: No Respiratory Status: Nasal cannula History of Recent Intubation: No Behavior/Cognition: Alert;Cooperative;Pleasant mood;Distractible Oral Cavity Assessment: Within Functional Limits Oral Care Completed by SLP: No Oral Cavity - Dentition: Adequate natural dentition Vision: Functional for self-feeding Self-Feeding Abilities: Able to feed self Patient Positioning: Upright in chair Baseline Vocal Quality: Normal    Oral/Motor/Sensory Function Overall Oral Motor/Sensory Function: Within functional limits   Ice Chips Ice chips: Not tested   Thin Liquid Thin Liquid: Within functional limits Presentation: Self Fed;Straw    Nectar Thick Nectar Thick Liquid:  Not tested   Honey Thick Honey Thick Liquid: Not tested   Puree Puree: Not tested   Solid   GO   Solid: Within functional limits Presentation: Self Fed        Germain Osgood 06/15/2018,12:07 PM  Germain Osgood, M.A. CCC-SLP [phone]

## 2018-06-15 NOTE — Progress Notes (Signed)
Pharmacy Antibiotic Note  Bruce Green is a 84 y.o. male admitted on 06/14/2018 with pneumonia.  Pharmacy has been consulted for vancomycin and zosyn dosing.  Presented with increased confusion, gait abnormalities, and SOB since last week. Of note, was started on amoxicillin last week for SOB. WBC 10, LA 1.43, Scr 1.43. Afebrile. Received 2 g ceftriaxone on 7/22 at 1748. CXR showing reticular opacities concerning for PNA or aspiration. Did recently fall and hit head (on apixaban), with CT head showing no acute abnormalities today.   Abx changed to Vanc/cefepime/doxy today.   Plan:  Vanc 1g IV q24 Start Cefepime 2g IV q24 Doxy 100mg  IV q12  Height: 5\' 11"  (180.3 cm) Weight: 160 lb (72.6 kg) IBW/kg (Calculated) : 75.3  Temp (24hrs), Avg:98.5 F (36.9 C), Min:98.2 F (36.8 C), Max:98.9 F (37.2 C)  Recent Labs  Lab 06/14/18 1710 06/14/18 1740 06/14/18 1932  WBC 10.0  --   --   CREATININE 1.43*  --   --   LATICACIDVEN  --  2.08* 1.43    Estimated Creatinine Clearance: 31 mL/min (A) (by C-G formula based on SCr of 1.43 mg/dL (H)).    Allergies  Allergen Reactions  . Other Other (See Comments)    General anesthesia - sensitivity,  causes severe lethargy   . Septra [Sulfamethoxazole-Trimethoprim] Hives  . Tramadol Other (See Comments)    Caused low b/p   . Sulfa Antibiotics Rash    Antimicrobials this admission: Ceftriaxone 7/22 x1 Vancomycin 7/22 >>  Zosyn 7/22 >>   Dose adjustments this admission: N/A  Microbiology results: 7/22 BCx: sent 7/22 UCx: sent  7/22 Sputum: sent   Onnie Boer, PharmD, BCIDP, AAHIVP, CPP Infectious Disease Pharmacist Pager: [phone] 06/15/2018 8:45 AM

## 2018-06-15 NOTE — Progress Notes (Signed)
Informed caregiver at bedside about home med (droxydopa) that family needs to bring to hospital.

## 2018-06-15 NOTE — Progress Notes (Addendum)
Echocardiogram 2D Echocardiogram with Definity has been performed.  06/15/2018 2:59 PM Maudry Mayhew, BS, RVT, RDCS, RDMS

## 2018-06-15 NOTE — Progress Notes (Signed)
PT Cancellation Note  Patient Details Name: JANZIEL HOCKETT MRN: [id number] DOB: [birth year]   Cancelled Treatment:    Reason Eval/Treat Not Completed: Other (comment) Have checked on patient 4 times today and he has been unavailable for treatment for various reasons. Caregivers state that he has just gotten settled and last MD requested that interruptions be minimized and he try to sleep. Will check back tomorrow.   [place] 06/15/2018, 3:36 PM

## 2018-06-16 ENCOUNTER — Encounter (HOSPITAL_COMMUNITY): Payer: Self-pay | Admitting: *Deleted

## 2018-06-16 DIAGNOSIS — I5020 Unspecified systolic (congestive) heart failure: Secondary | ICD-10-CM

## 2018-06-16 DIAGNOSIS — J9621 Acute and chronic respiratory failure with hypoxia: Secondary | ICD-10-CM

## 2018-06-16 DIAGNOSIS — J189 Pneumonia, unspecified organism: Secondary | ICD-10-CM

## 2018-06-16 DIAGNOSIS — Z951 Presence of aortocoronary bypass graft: Secondary | ICD-10-CM

## 2018-06-16 DIAGNOSIS — I480 Paroxysmal atrial fibrillation: Secondary | ICD-10-CM

## 2018-06-16 LAB — BASIC METABOLIC PANEL
Anion gap: 8 (ref 5–15)
BUN: 24 mg/dL — AB (ref 8–23)
CHLORIDE: 114 mmol/L — AB (ref 98–111)
CO2: 23 mmol/L (ref 22–32)
CREATININE: 1.26 mg/dL — AB (ref 0.61–1.24)
Calcium: 8 mg/dL — ABNORMAL LOW (ref 8.9–10.3)
GFR calc Af Amer: 54 mL/min — ABNORMAL LOW (ref >60)
GFR, EST NON AFRICAN AMERICAN: 46 mL/min — AB (ref >60)
GLUCOSE: 117 mg/dL — AB (ref 70–99)
POTASSIUM: 3.7 mmol/L (ref 3.5–5.1)
SODIUM: 145 mmol/L (ref 135–145)

## 2018-06-16 LAB — CBC
HCT: 37.8 % — ABNORMAL LOW (ref 39.0–52.0)
Hemoglobin: 11.2 g/dL — ABNORMAL LOW (ref 13.0–17.0)
MCH: 27.2 pg (ref 26.0–34.0)
MCHC: 29.6 g/dL — AB (ref 30.0–36.0)
MCV: 91.7 fL (ref 78.0–100.0)
PLATELETS: 333 10*3/uL (ref 150–400)
RBC: 4.12 MIL/uL — AB (ref 4.22–5.81)
RDW: 14.8 % (ref 11.5–15.5)
WBC: 9.3 10*3/uL (ref 4.0–10.5)

## 2018-06-16 MED ORDER — RISPERIDONE 0.5 MG PO TABS
0.2500 mg | ORAL_TABLET | Freq: Every day | ORAL | Status: DC
  Administered 2018-06-16 – 2018-06-17 (×3): 0.25 mg via ORAL
  Filled 2018-06-16 (×3): qty 1

## 2018-06-16 NOTE — Progress Notes (Signed)
NO CHARGE  Palliative Note:  Spoke with daughter, [name]. Goals of care meeting scheduled for tomorrow 7/25 @ 1000. Detailed note and recommendations to follow.   Thank you for your referral.   Alda Lea, NP-BC Palliative Medicine Team  Phone: [phone] Fax: [phone] Pager: [phone] Amion: N. [place]

## 2018-06-16 NOTE — Evaluation (Signed)
Physical Therapy Evaluation Patient Details Name: Bruce Green MRN: [id number] DOB: Aug 11, 1919 Today's Date: 06/16/2018   History of Present Illness  84 y.o. male admitted on 06/14/18 for SOB and found to have PNA.  and sepsis with increased confusion/delerium compared to baseline.  Pt with significant PMH of TIA, esophageal stricture, ischemic cardiomyopathy, HTN, CAD, CKD, shoulder surgery, and CABG.  Clinical Impression  Pt is confused and restless.  He sat EOB for me while his aide from home fed him supper.  He is weaker and more confused than normal with DOE in sitting 3/4 with O2 [place] applied.  He is SNF appropriate at d/c, however, family would prefer for him to d/c home with his 24/7 aids and resume [place] therapy.  PT to follow acutely until d/c confirmed.       Follow Up Recommendations SNF(family prefers return home with resuming of [facility])    [facility] bed    Recommendations for Other Services   NA    Precautions / Restrictions Precautions Precautions: Fall Precaution Comments: h/o falls      Mobility  Bed Mobility Overal bed mobility: Needs Assistance Bed Mobility: Supine to Sit;Sit to Supine     Supine to sit: Mod assist;HOB elevated Sit to supine: Mod assist   General bed mobility comments: Mod assist to support his trunk and support his legs to get to sitting EOB. Mod assist to help him lift both legs back into bed to return to supine.   Transfers                 General transfer comment: unable to get pt to agree to do, however, per RN and aid, she got him up to the recliner chair earlier in the day.          Balance Overall balance assessment: Needs assistance Sitting-balance support: Feet supported;No upper extremity supported;Bilateral upper extremity supported Sitting balance-Leahy Scale: Fair                                       Pertinent Vitals/Pain Pain Assessment: Faces Faces Pain Scale: Hurts  little more Pain Location: generalized back and L arm IV site Pain Descriptors / Indicators: Aching Pain Intervention(s): Limited activity within patient's tolerance;Monitored during session;Repositioned    Home Living Family/patient expects to be discharged to:: Private residence Living Arrangements: Alone Available Help at Discharge: Personal care attendant;Available 24 hours/day Type of Home: House Home Access: Stairs to enter Entrance Stairs-Rails: Right;Left Entrance Stairs-Number of Steps: 3   Home Equipment: [place] - 2 wheels;Cane - single point;Grab bars - toilet      Prior Function Level of Independence: Needs assistance   Gait / Transfers Assistance Needed: has 24 hour aids at the house, uses RW for gait, assist on stairs for home entry.            Hand Dominance   Dominant Hand: Right    Extremity/Trunk Assessment   Upper Extremity Assessment Upper Extremity Assessment: Generalized weakness    Lower Extremity Assessment Lower Extremity Assessment: Generalized weakness    Cervical / Trunk Assessment Cervical / Trunk Assessment: Kyphotic  Communication   Communication: HOH  Cognition Arousal/Alertness: Awake/alert Behavior During Therapy: Restless Overall Cognitive Status: Impaired/Different from baseline Area of Impairment: Orientation;Attention;Memory;Following commands;Safety/judgement;Awareness;Problem solving                 Orientation Level: Disoriented to;Place;Time;Situation Current  Attention Level: Focused Memory: Decreased recall of precautions;Decreased short-term memory Following Commands: Follows one step commands inconsistently Safety/Judgement: Decreased awareness of safety;Decreased awareness of deficits Awareness: Intellectual Problem Solving: Difficulty sequencing;Requires verbal cues;Requires tactile cues;Decreased initiation General Comments: Pt is restless and internally distracted by lines and bandage on his right arm.  He  gets easily irritated when he is stopped from pulling at his IV line.  Daughters and aide are there assisting in his care.       General Comments General comments (skin integrity, edema, etc.): Pt with 3/4 DOE with just sitting EOB.  Could not check O2 due to restlessness        Assessment/Plan    PT Assessment Patient needs continued PT services  PT Problem List Decreased strength;Decreased activity tolerance;Decreased balance;Decreased mobility;Decreased cognition;Decreased knowledge of use of DME;Cardiopulmonary status limiting activity;Decreased safety awareness;Decreased knowledge of precautions       PT Treatment Interventions DME instruction;Gait training;Stair training;Functional mobility training;Therapeutic activities;Therapeutic exercise;Balance training;Cognitive remediation;Patient/family education    PT Goals (Current goals can be found in the Care Plan section)  Acute Rehab PT Goals Patient Stated Goal: daughters want him to return home with his aids PT Goal Formulation: With family Time For Goal Achievement: 06/30/18 Potential to Achieve Goals: Fair    Frequency Min 3X/week           AM-PAC PT "6 Clicks" Daily Activity  Outcome Measure Difficulty turning over in bed (including adjusting bedclothes, sheets and blankets)?: Unable Difficulty moving from lying on back to sitting on the side of the bed? : Unable Difficulty sitting down on and standing up from a chair with arms (e.g., wheelchair, bedside commode, etc,.)?: Unable Help needed moving to and from a bed to chair (including a wheelchair)?: A Lot Help needed walking in hospital room?: A Lot Help needed climbing 3-5 steps with a railing? : A Lot 6 Click Score: 9    End of Session Equipment Utilized During Treatment: Oxygen Activity Tolerance: Patient limited by fatigue Patient left: in bed;with call bell/phone within reach;with restraints reapplied;with family/visitor present   PT Visit Diagnosis:  History of falling (Z91.81);Difficulty in walking, not elsewhere classified (R26.2);Muscle weakness (generalized) (M62.81)    Time: 8115-7262 PT Time Calculation (min) (ACUTE ONLY): 32 min   Charges:        Wells Guiles B. Clarence Cogswell, PT, DPT [phone]    PT Evaluation $PT Eval Moderate Complexity: 1 Mod PT Treatments $Therapeutic Activity: 8-22 mins   06/16/2018, 6:05 PM

## 2018-06-16 NOTE — Progress Notes (Signed)
Hand mittens placed on bilateral hands due to patient not compliant with keeping oxygen on. Patient has been educated on importance of keeping oxygen on at all times. Due to patient mental status. Patient responded combative physically and verbally inappropriate. Patient personal caregiver Levada Dy) at bedside.

## 2018-06-16 NOTE — Progress Notes (Signed)
  Speech Language Pathology Treatment: Dysphagia  Patient Details Name: Bruce Green MRN: [id number] DOB: 28-Jun-1919 Today's Date: 06/16/2018 Time: 7972-8206 SLP Time Calculation (min) (ACUTE ONLY): 15 min  Assessment / Plan / Recommendation Clinical Impression  Pt consumed thin liquids via straw with frequent eructation and one delayed cough noted. Consistent exhalation is noted post-swallow, as appropriate for breath/swallow sequence. This appears generally consistent with esophagram completed in April with risk for aspiration suspected to be greatest post-prandially. Pt's daughters are present and deny any overt difficulty with meals, and they do not think he needs further SLP f/u. Signs of aspiration were reviewed for them to monitor. Would continue using aspiration and esophageal precautions, particularly maintaining a more upright posture after meals. SLP to sign off - please reorder if there are acute changes or if family would like further assessment.    HPI HPI: Pt is a 84 y.o. M with hx CKD baseline Cr 1.3, chronic respiratory failure from MR and CHF EF 25%, THN and Afib on Eliquis who presents with 3 months progressive functional decline, and now several days new worse SOB, increased hypoxia and weakness. CXR showed RUL PNA. Pt was admitted in April 2019 wtih PNA as well with BSE suggesting functional oropharyngeal swallow. Esophagram at that time showed oropharyngeal swallow with good airway protection but also revealed an esophageal stricture, HH, GER, and mild dysmotility.      SLP Plan  All goals met       Recommendations  Diet recommendations: Regular;Thin liquid Liquids provided via: Cup;Straw Medication Administration: Whole meds with liquid(crush larger pills) Supervision: Patient able to self feed;Full supervision/cueing for compensatory strategies Compensations: Minimize environmental distractions;Slow rate;Small sips/bites;Follow solids with liquid Postural Changes  and/or Swallow Maneuvers: Seated upright 90 degrees;Upright 30-60 min after meal                Oral Care Recommendations: Oral care BID Follow up Recommendations: 24 hour supervision/assistance SLP Visit Diagnosis: Dysphagia, unspecified (R13.10) Plan: All goals met       GO                Germain Osgood 06/16/2018, 2:41 PM  Germain Osgood, M.A. CCC-SLP [phone]

## 2018-06-16 NOTE — Progress Notes (Signed)
Patient daughter and personal caregiver educated on MRSA contact precautions and gown precautions when staying in room. Personal care giver Levada Dy) agreed to requirements. Daughter refused to wear precautions.

## 2018-06-16 NOTE — Progress Notes (Signed)
Requested by family that pt care and medication be performed and administered when pt is awake and to not wake pt to promote healing.

## 2018-06-16 NOTE — Progress Notes (Signed)
PROGRESS NOTE  Bruce Green:[id number] DOB: Jun 23, 1919 DOA: 06/14/2018 PCP: Kathyrn Lass, MD  HPI/Recap of past 24 hours: Mr. Bruce Green is a 84 y.o. M with hx CKD baseline Cr 1.3, chronic respiratory failure from MR and CHF EF 25%, Afib on Eliquis who presents with 3 months progressive functional decline, and now several days new worse SOB, increased hypoxia and weakness. Pt admitted for further management.  Today, met pt sitting up in chair, mostly nodding off. Unable to answer questions appropriately/unable to perform ROS.   Assessment/Plan: Principal Problem:   PNA (pneumonia) Active Problems:   Systolic CHF with reduced left ventricular function, NYHA class 2 (HCC)   CAD (coronary artery disease)   S/P CABG (coronary artery bypass graft)   AAA (abdominal aortic aneurysm) without rupture (HCC)   Atrial fibrillation (HCC)   Acute on chronic respiratory failure with hypoxia ([place])   Sepsis from community acquired pneumonia of the RIGHT upper lobe Currently afebrile, with no leukocytosis Urine strep pneumo BC X 2 NGTD CXR showed RUL CAP ? Aspiration pneumonia, SLP on board Continue Vancomycin, Cefepime, doxycycline for atypical coverage Monitor closely  Acute metabolic encephalopathy/Delirium At baseline, ??dementia, with ongoing delirium likely worsened by PNA UA/UC pending Delirium precautions Start risperidone QHS  Acute on chronic respiratory failure On baseline 2L O2 at home, requiring 4L now Continue supplemental O2, duoneb             Atrial fibrillation, paroxysmal Rate controlled CHA2DS2-Vasc 6 Continue Eliquis, amiodarone  Hypertension/Chronic systolic CHF/Mitral regurgitation HTN stable ECHO showed EF of 25-30%, diffuse hypokinesis, Grade 1DD Hold home furosemide PRN for now Strict I&O, Daily weights Continue statin  CKD III Baseline Cr 1.4 Currently better than baseline  Orthostatic hypotension Continue midodrine and droxidopa         Code Status: DNR  Family Communication: Discussed extensively with pt daughters about [place], and medical management  Disposition Plan: SNF recommended, but family wants home   Consultants:  None  Procedures:  None  Antimicrobials:  Vancomycin  Cefepime  Doxycycline  DVT prophylaxis:  Eliquis   Objective: Vitals:   06/15/18 2012 06/15/18 2052 06/16/18 0748 06/16/18 1440  BP: 135/67   125/72  Pulse: 80   83  Resp: 16   16  Temp: 98.5 F (36.9 C)   98.7 F (37.1 C)  TempSrc: Oral   Oral  SpO2: (!) 30% 92% 94%   Weight:   71.9 kg (158 lb 8.2 oz)   Height:        Intake/Output Summary (Last 24 hours) at 06/16/2018 2007 Last data filed at 06/16/2018 1700 Gross per 24 hour  Intake 563 ml  Output 600 ml  Net -37 ml   Filed Weights   06/14/18 1700 06/16/18 0748  Weight: 72.6 kg (160 lb) 71.9 kg (158 lb 8.2 oz)    Exam:   General: NAD, alert, disoriented, thin, frail  Cardiovascular: S1, S2 present   Respiratory: Coarse breath sound b/l, diminished  Abdomen: Soft, NT, ND, BS present  Musculoskeletal: No pedal edema  Skin: Normal  Psychiatry: Normal mood    Data Reviewed: CBC: Recent Labs  Lab 06/14/18 1710 06/15/18 0841 06/16/18 0525  WBC 10.0 10.1 9.3  NEUTROABS 8.3* 8.0*  --   HGB 12.3* 11.5* 11.2*  HCT 39.7 38.0* 37.8*  MCV 88.6 89.4 91.7  PLT 338 326 638   Basic Metabolic Panel: Recent Labs  Lab 06/14/18 1710 06/15/18 0841 06/16/18 0525  NA 143 144 145  K  4.0 3.6 3.7  CL 108 108 114*  CO2 _0 GLUCOSE 126* 150* 117*  BUN 27* 25* 24*  CREATININE 1.43* 1.27* 1.26*  CALCIUM 8.2* 8.1* 8.0*   GFR: Estimated Creatinine Clearance: 34.9 mL/min (A) (by C-G formula based on SCr of 1.26 mg/dL (H)). Liver Function Tests: Recent Labs  Lab 06/14/18 1710  AST 76*  ALT 67*  ALKPHOS 62  BILITOT 0.7  PROT 4.6*  ALBUMIN 2.3*   No results for input(s): LIPASE, AMYLASE in the last 168 hours. No results for input(s):  AMMONIA in the last 168 hours. Coagulation Profile: No results for input(s): INR, PROTIME in the last 168 hours. Cardiac Enzymes: Recent Labs  Lab 06/14/18 1710  TROPONINI <0.03   BNP (last 3 results) No results for input(s): PROBNP in the last 8760 hours. HbA1C: No results for input(s): HGBA1C in the last 72 hours. CBG: No results for input(s): GLUCAP in the last 168 hours. Lipid Profile: No results for input(s): CHOL, HDL, LDLCALC, TRIG, CHOLHDL, LDLDIRECT in the last 72 hours. Thyroid Function Tests: No results for input(s): TSH, T4TOTAL, FREET4, T3FREE, THYROIDAB in the last 72 hours. Anemia Panel: No results for input(s): VITAMINB12, FOLATE, FERRITIN, TIBC, IRON, RETICCTPCT in the last 72 hours. Urine analysis:    Component Value Date/Time   COLORURINE YELLOW 07/13/2017 1740   APPEARANCEUR HAZY (A) 07/13/2017 1740   LABSPEC 1.008 07/13/2017 1740   PHURINE 6.0 07/13/2017 1740   GLUCOSEU NEGATIVE 07/13/2017 1740   HGBUR NEGATIVE 07/13/2017 1740   BILIRUBINUR NEGATIVE 07/13/2017 1740   KETONESUR NEGATIVE 07/13/2017 1740   PROTEINUR NEGATIVE 07/13/2017 1740   UROBILINOGEN 1.0 04/24/2014 0108   NITRITE NEGATIVE 07/13/2017 1740   LEUKOCYTESUR LARGE (A) 07/13/2017 1740   Sepsis Labs: _1 (procalcitonin:4,lacticidven:4)  ) Recent Results (from the past 240 hour(s))  Blood Culture (routine x 2)     Status: None (Preliminary result)   Collection Time: 06/14/18  5:11 PM  Result Value Ref Range Status   Specimen Description BLOOD LEFT ANTECUBITAL  Final   Special Requests   Final    BOTTLES DRAWN AEROBIC AND ANAEROBIC Blood Culture adequate volume   Culture   Final    NO GROWTH 2 DAYS Performed at [facility], [place] [address] [place], [place] [address]    Report Status PENDING  Incomplete  Blood Culture (routine x 2)     Status: None (Preliminary result)   Collection Time: 06/14/18  5:39 PM  Result Value Ref Range Status   Specimen Description BLOOD RIGHT  WRIST  Final   Special Requests   Final    BOTTLES DRAWN AEROBIC ONLY Blood Culture results may not be optimal due to an inadequate volume of blood received in culture bottles   Culture   Final    NO GROWTH 2 DAYS Performed at [facility], [place] [address], [place], [place] [address]    Report Status PENDING  Incomplete  MRSA PCR Screening     Status: Abnormal   Collection Time: 06/15/18 12:04 PM  Result Value Ref Range Status   MRSA by PCR POSITIVE (A) NEGATIVE Final    Comment:        The GeneXpert MRSA Assay (FDA approved for NASAL specimens only), is one component of a comprehensive MRSA colonization surveillance program. It is not intended to diagnose MRSA infection nor to guide or monitor treatment for MRSA infections. RESULT CALLED TO, READ BACK BY AND VERIFIED WITH: Burna Mortimer RN 14:00 06/15/18 (  wilsonm) Performed at [facility], [place] [address], [place], [place] [address]       Studies: No results found.  Scheduled Meds: . amiodarone  200 mg Oral Daily  . apixaban  2.5 mg Oral BID  . atorvastatin  40 mg Oral Daily  . Droxidopa  100 mg Oral TID  . ipratropium-albuterol  3 mL Nebulization TID  . midodrine  10 mg Oral TID WC  . risperiDONE  0.25 mg Oral QHS  . sodium chloride flush  3 mL Intravenous Q12H    Continuous Infusions: . sodium chloride    . ceFEPime (MAXIPIME) IV 2 g (06/16/18 1219)  . doxycycline (VIBRAMYCIN) IV 100 mg (06/16/18 1545)  . vancomycin 1,000 mg (06/15/18 2155)     LOS: 1 day     Alma Friendly, MD [facility]   If 7PM-7AM, please contact night-coverage [email or web address] Password [facility] 06/16/2018, 8:07 PM

## 2018-06-16 NOTE — Progress Notes (Signed)
Patient combative while performing peri care and linen change due to incontinent episode. Skin tear on right hand occurred while holding hand to prevent patient from hitting staff while kicking lower extremities. Patient personal caregiver at bedside.

## 2018-06-17 ENCOUNTER — Inpatient Hospital Stay (HOSPITAL_COMMUNITY): Payer: Medicare Other

## 2018-06-17 DIAGNOSIS — W19XXXA Unspecified fall, initial encounter: Secondary | ICD-10-CM

## 2018-06-17 DIAGNOSIS — Z66 Do not resuscitate: Secondary | ICD-10-CM

## 2018-06-17 DIAGNOSIS — Z515 Encounter for palliative care: Secondary | ICD-10-CM

## 2018-06-17 DIAGNOSIS — A419 Sepsis, unspecified organism: Principal | ICD-10-CM

## 2018-06-17 DIAGNOSIS — J181 Lobar pneumonia, unspecified organism: Secondary | ICD-10-CM

## 2018-06-17 DIAGNOSIS — I2510 Atherosclerotic heart disease of native coronary artery without angina pectoris: Secondary | ICD-10-CM

## 2018-06-17 DIAGNOSIS — Z7189 Other specified counseling: Secondary | ICD-10-CM

## 2018-06-17 DIAGNOSIS — G9341 Metabolic encephalopathy: Secondary | ICD-10-CM

## 2018-06-17 LAB — CBC WITH DIFFERENTIAL/PLATELET
Abs Immature Granulocytes: 0.1 10*3/uL (ref 0.0–0.1)
BASOS ABS: 0 10*3/uL (ref 0.0–0.1)
BASOS PCT: 0 %
EOS ABS: 0.1 10*3/uL (ref 0.0–0.7)
EOS PCT: 1 %
HCT: 36.7 % — ABNORMAL LOW (ref 39.0–52.0)
HEMOGLOBIN: 11.6 g/dL — AB (ref 13.0–17.0)
Immature Granulocytes: 1 %
Lymphocytes Relative: 7 %
Lymphs Abs: 0.8 10*3/uL (ref 0.7–4.0)
MCH: 27.4 pg (ref 26.0–34.0)
MCHC: 31.6 g/dL (ref 30.0–36.0)
MCV: 86.8 fL (ref 78.0–100.0)
MONO ABS: 1 10*3/uL (ref 0.1–1.0)
Monocytes Relative: 10 %
Neutro Abs: 8.7 10*3/uL — ABNORMAL HIGH (ref 1.7–7.7)
Neutrophils Relative %: 81 %
PLATELETS: 300 10*3/uL (ref 150–400)
RBC: 4.23 MIL/uL (ref 4.22–5.81)
RDW: 14.8 % (ref 11.5–15.5)
WBC: 10.7 10*3/uL — ABNORMAL HIGH (ref 4.0–10.5)

## 2018-06-17 LAB — GLUCOSE, CAPILLARY: GLUCOSE-CAPILLARY: 129 mg/dL — AB (ref 70–99)

## 2018-06-17 LAB — BASIC METABOLIC PANEL
Anion gap: 10 (ref 5–15)
BUN: 25 mg/dL — AB (ref 8–23)
CALCIUM: 8.2 mg/dL — AB (ref 8.9–10.3)
CO2: 26 mmol/L (ref 22–32)
CREATININE: 1.26 mg/dL — AB (ref 0.61–1.24)
Chloride: 111 mmol/L (ref 98–111)
GFR calc Af Amer: 54 mL/min — ABNORMAL LOW (ref >60)
GFR, EST NON AFRICAN AMERICAN: 46 mL/min — AB (ref >60)
Glucose, Bld: 93 mg/dL (ref 70–99)
Potassium: 3.5 mmol/L (ref 3.5–5.1)
Sodium: 147 mmol/L — ABNORMAL HIGH (ref 135–145)

## 2018-06-17 MED ORDER — FUROSEMIDE 10 MG/ML IJ SOLN
40.0000 mg | Freq: Once | INTRAMUSCULAR | Status: AC
  Administered 2018-06-17: 40 mg via INTRAVENOUS
  Filled 2018-06-17: qty 4

## 2018-06-17 NOTE — Progress Notes (Signed)
Several family members gathered around the patient's bed.  Encouraged to allow patient to rest.

## 2018-06-17 NOTE — Consult Note (Signed)
[place]: Met with pt and his daughter. Pt lives at home with 24/7 hired caregivers. We have ordered equipment from [facility] to be delivered of hospital bed, OBT and BSC. Pt already has oxygen in home and wheelchair and rollator with [facility]. This equipment will be delivered this afternoon and the daughter will call hospital RN to let her know when it is in home so that she can arrange transport for pt to go home by ambulance. Manuela Schwartz will do ambulance transport and give to the RN. Webb Silversmith RN [phone]

## 2018-06-17 NOTE — Discharge Instructions (Signed)

## 2018-06-17 NOTE — Progress Notes (Signed)
Late Entry:  RT responded to Rapid Response at time of call. When I arrived, patient was on [place], SAT 78%. Placed patient on 100% NRB, SAT 94% and patient began to come around. Multiple RN's at bedside when I was called for another emergent situation and was forced to leave. Asked to call me back if needed

## 2018-06-17 NOTE — Progress Notes (Signed)
The daughter [name]) was notified of the patient's episode.  Dr Horris Latino is present.

## 2018-06-17 NOTE — Significant Event (Addendum)
Rapid Response Event Note  Overview:  At 07:35, received call for patient who had near sycope vs syncopal episode. In a code blue, so unable to respond, however, respiratory therapist and clinical administrator responded. Received report from administrator at 08:00 when I arrived. Patient admitted with PNA, had near syncope, but has recovered. Currently having issues with desaturation, titrating oxygen via venti mask for increased O2 demand in the setting of current diagnosis. Code status clarified yesterday and patient was made DNR and palliative care consult pending.    Initial Focused Assessment: When I arrived, patient in recliner chair on venti mask. Patient appears alert and follows commands appropriately. Left anterior lung sounds are diminished throughout. Bilat anterior lung crackles heard. Posteriorly, crackles heard bilat. O2 sats 92-94%. Patient states he feels comfortable.   Interventions: Titrated oxygen as needed. Plan of Care (if not transferred): As soon as I exited room, primary physician arrived. Updates given. Awaiting palliative care consult. Patient to remain on floor. Plan per Dr. Horris Latino Event Summary: Name of Physician Notified: Dr Horris Latino at [phone]    at    Outcome: Other (Comment)  Event End Time: [place]

## 2018-06-17 NOTE — Care Management Note (Addendum)
Case Management Note  Patient Details  Name: Bruce Green MRN: [id number] Date of Birth: [birth year]  Subjective/Objective:   84 yr old gentleman admitted with hypoxia, being treated for pneumonia             Action/Plan: Case manager has spoken with patient's daughters and son concerning discharge plan. Per Dr. Horris Latino, patient's condition is declining and family wishes to take him home with Hospice Care. Family requested [facility] [place]. CM called referral to [facility] [phone]. Family requests that patient be transported home after hospital bed has been delivered and setup. Case manager asked if they would be interested in allowing the use of aromatherapy to help patient relax , the daughters were very receptive, they use oils at home. CM placed a small amount of the PEACE blend on cotton ball and let them place it wherever they wanted.   Patient will require transport via [facility], and will need oxygen. Patient had been receiving Home Health services through [place], [place] called Glyn Ade, [facility] Liaison to update her on patient's discharge plan.    Expected Discharge Date:    06/17/18              Expected Discharge Plan:  Home w Hospice Care  In-House Referral:  NA  Discharge planning Services  CM Consult  Post Acute Care Choice:  Hospice Choice offered to:  [facility] POA / Guardian, Adult Children  DME Arranged:  [place] A DME Agency:  [place] Arranged:  RN [facility] Agency:  [place]  Status of Service:  Completed, signed off  If discussed at [employer], dates discussed:    Additional Comments:  Ninfa Meeker, RN 06/17/2018, 12:01 PM

## 2018-06-17 NOTE — Consult Note (Signed)
Consultation Note Date: 06/17/2018   Patient Name: Bruce Green  DOB: Feb 17, 1919  MRN: [id number]  Age / Sex: 84 y.o., male  PCP: Kathyrn Lass, MD Referring Physician: Alma Friendly, MD  Reason for Consultation: Establishing goals of care  HPI/Patient Profile: 84 y.o. male admitted on 06/14/2018 from home with complaints of weakness, altered mental status, and shortness of breath.  He has a past medical history significant for hypertension, anxiety, coronary artery disease, GERD, TIA, dyslipidemia, ischemic cardiomyopathy (EF 37-09%), and systolic CHF.  According to patient's daughter he was started on amoxicillin by his primary care physician several days ago.  At home his oxygen saturations were low and he was requiring 4 L of oxygen with saturations in the mid 90s.  Family reported he has been more confused than normal and does not have a history of dementia.  He was hospitalized back in April of this year for questionable aspiration pneumonia and was treated with Augmentin.  In the ED patient had blood cultures completed and was started on IV vancomycin and Zosyn.  Chest x-ray showed right upper lobe community-acquired pneumonia questionable aspiration.  Palliative care team consulted for goals of care discussion.  Clinical Assessment and Goals of Care: I have reviewed medical records including lab results, imaging, Epic notes, and MAR, received report from the bedside RN, and assessed the patient. I then met at the bedside with patient's daughters [name] [facility]) and daughter Hoyle Sauer (former ICU nurse) to discuss diagnosis prognosis, [place], EOL wishes, disposition and options.  Patient is out of bed in chair and continues to be somewhat agitated.  He is confused and unable to identify name, date of birth or family members.  Hired caregiver is also at the bedside at daughter's request.  Patient is unable to  engage appropriately in goals of care discussion with his daughters and I.  I introduced Palliative Medicine as specialized medical care for people living with serious illness. It focuses on providing relief from the symptoms and stress of a serious illness. The goal is to improve quality of life for both the patient and the family.  Both daughters verbalized their understanding and awareness of palliative and hospice services, as their mother received hospice services for end-of-life care.  We discussed a brief life review of the patient.  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  They report he was very proud of this.  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  He is of the [employer].  Daughter states he loves being outside and also reading.  As far as functional and nutritional status.  Both daughter and outside caregiver reports patient was able to assist with his normal ADLs about 6 weeks ago.  They report he was alert and oriented x3 and ambulatory with walker assistance.  He also had a great appetite and would always eat all of his meals including several snacks throughout the  day.  Family reports over the past month patient has continued to show signs of severe decline.  About 3 weeks ago he became unsteady on his feet and unable to walk with his walker even with standby assistance.  His appetite began to decrease and he would often decline meals or have to be prompted to eat.  Daughters also report they have noticed intermittent confusion some more in afternoon versus in the day.  Caregiver states he began to require more assistance with ADLs, and would often sleep more throughout the day than normal.  We discussed his current illness and what it means in the larger context of his on-going co-morbidities.  Natural disease trajectory and expectations at EOL were discussed.  Both daughters verbalized his current  condition.  Both were tearful when discussing the changes that they have saw over the past weeks.  Daughter states they are not nave and realized that his body may be shutting down.  Daughter states that her main objectives this for their father to return home and receive the best care until he passes away.  They verbalized that he would not want to live in his current state.  I attempted to elicit values and goals of care important to the patient.    The difference between aggressive medical intervention and comfort care was considered in light of the patient's goals of care.  At this time they would like to continue to treat the treatable while hospitalized.  We discussed what comfort care measures would look like.  Daughters are aware that we will comfort measures patient will continue to receive excellent care and that staff would focus on symptom management such as agitation, anxiety, shortness of breath, pain and/or discomfort, and excessive secretions.  The goal of comfort care is to make sure patient is comfortable during end-of-life transition.  They are aware that aggressive measures such as IV fluids, lab work, and x-rays would not be indicated.  Advanced directives, concepts specific to code status, artifical feeding and hydration, and rehospitalization were considered and discussed.  Daughters verbalized patient does have advanced directives and living well in the home.  His daughter [name] is a documented primary POA.  They state he has already made all of his funeral arrangements and plans.  Daughters confirm that patient is a DNR/DNI.  They are concerned that patient is not able to eat or drink anything due to aspiration risk.  We discussed in detail given the goal is for patient to return home with hospice that if they wanted him to be able to have p.o. intake this would be appropriate given comfort measures.  Daughters verbalized they would like for the patient to be allowed comfort feeds and  aware of the risks of aspiration.  Hospice and Palliative Care services outpatient were explained and offered.  Family reports patient has always made his wishes known that he would like to pass away in his own home environment.  They currently have 24/7 hired nursing caregivers and would like for patient to return home with hospice services in addition to other home health caregivers.  We briefly discussed the goals of hospice. Patient and family verbalized understanding and appreciation.  Questions and concerns were addressed.  Hard Choices booklet left for review. The family was encouraged to call with questions or concerns.  PMT will continue to support holistically.  Primary decision-maker: Statistician (Daughter)    SUMMARY OF RECOMMENDATIONS    DNR/DNI-as confirmed by daughters  Continue to treat the  treatable while hospitalized, without escalation of care.  Daughters have verbalized their wishes for patient to return to his home with hired caregivers and outpatient hospice services.  They are requesting hospice of the [place].  Daughter state that they feel he will need a hospital bed and oxygen as well.  Patient will be allowed comfort feeds as daughters have been educated on the risk of aspiration and feel that at this time given he is going home with hospice they would like for him to have what ever he asked for.  Again they are aware the patient should only eat and drink when he is alert and aware enough to swallow.  Both daughters and caregivers verbalized understanding.  Case management consult for outpatient hospice services and equipment needs.  I have spoken with Manuela Schwartz, CM and she is aware of the request.  Palliative medicine team will continue to support patient, patient's family, and medical team during hospitalization.  Code Status/Advance Care Planning:  DNR/DNI   Palliative Prophylaxis:   Aspiration, Bowel Regimen, Delirium Protocol, Frequent Pain  Assessment, Oral Care and Turn Reposition  Additional Recommendations (Limitations, Scope, Preferences):  Full Scope Treatment-team to treat the treatable while hospitalized without escalation of care.  Psycho-social/Spiritual:   Desire for further Chaplaincy support: No  Prognosis:   < 4 weeks-in the setting of pneumonia, systolic CHF, atrial fibrillation, altered mental status, immobility, poor p.o. intake, hypertension, acute on chronic respiratory failure with hypoxia, CAD status post CABG, EF 25-30%, and chronic kidney disease.  Discharge Planning: Home with Hospice      Primary Diagnoses: Present on Admission: . PNA (pneumonia) . Atrial fibrillation ([place]) . CAD (coronary artery disease) . Systolic CHF with reduced left ventricular function, NYHA class 2 ([place]) . Acute on chronic respiratory failure with hypoxia ([place]) . AAA (abdominal aortic aneurysm) without rupture ([place])   I have reviewed the medical record, interviewed the patient and family, and examined the patient. The following aspects are pertinent.  Past Medical History:  Diagnosis Date  . Anxiety   . Cataract   . CKD (chronic kidney disease)   . Colon polyp   . Coronary artery disease 04/21/2014     severe triple vessel  . Dyslipidemia   . GERD (gastroesophageal reflux disease)   . Hiatal hernia    6cm  . Hypertension   . Ischemic cardiomyopathy 03/2014   EF 25-30%  . Pneumonia   . Stricture esophagus    distal  . TIA (transient ischemic attack)   . Vitamin D deficiency    Social History   Socioeconomic History  . Marital status: Widowed    Spouse name: Not on file  . Number of children: 2  . Years of education: Not on file  . Highest education level: Not on file  Occupational History  . Occupation: retired  Scientific laboratory technician  . Financial resource strain: Not on file  . Food insecurity:    Worry: Not on file    Inability: Not on file  . Transportation needs:    Medical: Not on file     Non-medical: Not on file  Tobacco Use  . Smoking status: Never Smoker  . Smokeless tobacco: Never Used  Substance and Sexual Activity  . Alcohol use: Yes    Alcohol/week: 0.6 oz    Types: 1 Standard drinks or equivalent per week    Comment: rare  . Drug use: No  . Sexual activity: Not on file  Lifestyle  . Physical activity:  Days per week: 2 days    Minutes per session: 30 min  . Stress: To some extent  Relationships  . Social connections:    Talks on phone: More than three times a week    Gets together: Never    Attends religious service: Never    Active member of club or organization: No    Attends meetings of clubs or organizations: Never    Relationship status: Widowed  Other Topics Concern  . Not on file  Social History Narrative   Retired Conservation officer, historic buildings. Widowed. Was in [employer] and [place]. He has 2 daughters and several grandchildren.       Family History  Problem Relation Age of Onset  . Heart attack Mother   . Kidney disease Father   . Colon cancer Brother   . Dementia Brother   . Aneurysm Brother   . Breast cancer Sister        x 2  . Arthritis Sister   . Osteoporosis Sister    Scheduled Meds: . amiodarone  200 mg Oral Daily  . apixaban  2.5 mg Oral BID  . atorvastatin  40 mg Oral Daily  . Droxidopa  100 mg Oral TID  . ipratropium-albuterol  3 mL Nebulization TID  . midodrine  10 mg Oral TID WC  . risperiDONE  0.25 mg Oral QHS  . sodium chloride flush  3 mL Intravenous Q12H   Continuous Infusions: . sodium chloride    . ceFEPime (MAXIPIME) IV 2 g (06/16/18 1219)  . doxycycline (VIBRAMYCIN) IV 100 mg (06/17/18 0422)  . vancomycin 1,000 mg (06/16/18 2259)   PRN Meds:.sodium chloride, albuterol, furosemide, Melatonin, sodium chloride flush Medications Prior to Admission:  Prior to Admission medications   Medication Sig Start Date End Date Taking? Authorizing Provider  acetaminophen (TYLENOL) 500 MG tablet Take 500-1,000 mg by mouth 2 (two) times daily as  needed for moderate pain.    Yes [provider]  amiodarone (PACERONE) 200 MG tablet Take 1 tablet (200 mg total) by mouth daily. 09/21/17  Yes Croitoru, Mihai, MD  amoxicillin (AMOXIL) 875 MG tablet Take 875 mg by mouth 2 (two) times daily. For 10 days 06/10/18  Yes [provider]  apixaban (ELIQUIS) 2.5 MG TABS tablet Take 1 tablet (2.5 mg total) by mouth 2 (two) times daily. 03/09/18  Yes Croitoru, Mihai, MD  atorvastatin (LIPITOR) 80 MG tablet Take 40 mg by mouth daily.   Yes [provider]  B Complex-C (B-COMPLEX WITH VITAMIN C) tablet Take 1 tablet by mouth every other day.    Yes [provider]  Cholecalciferol (VITAMIN D3) 5000 units TABS Take 5,000 Units by mouth every Monday, Wednesday, and Friday.    Yes [provider]  Droxidopa (NORTHERA) 100 MG CAPS Take 100 mg by mouth 3 (three) times daily. 05/11/18  Yes Croitoru, Mihai, MD  fluticasone (FLONASE) 50 MCG/ACT nasal spray Place 1 spray into both nostrils daily as needed for allergies or rhinitis.   Yes [provider]  folic acid (FOLVITE) 201 MCG tablet Take 400 mcg by mouth daily.   Yes [provider]  furosemide (LASIX) 40 MG tablet Take 0.5 tablets (20 mg total) by mouth daily as needed. FOR WT GAIN >3# IN 1 DAY  -OR- >5# IN A WEEK Patient taking differently: Take 20 mg by mouth daily as needed (for a weight gain of 3 pounds or more in a day or 5 pounds or more in a week).  08/04/17  Yes Barrett, Evelene Croon, PA-C  guaiFENesin (MUCINEX) 600 MG 12 hr tablet Take 1 tablet (600 mg total) by mouth 2 (two) times daily. Patient taking differently: Take 600 mg by mouth daily.  03/08/18  Yes Regalado, Belkys A, MD  ipratropium-albuterol (DUONEB) 0.5-2.5 (3) MG/3ML SOLN Take 3 mLs by nebulization 3 (three) times daily. 03/08/18  Yes Regalado, Belkys A, MD  loratadine (CLARITIN) 10 MG tablet Take 10 mg by mouth daily.    Yes [provider]  Magnesium 400 MG CAPS Take 400 mg  by mouth every other day. Patient taking differently: Take 400 mg by mouth every Monday, Wednesday, and Friday.  06/25/17  Yes Croitoru, Mihai, MD  Melatonin 5 MG TABS Take 5-10 mg by mouth at bedtime as needed (for sleep).    Yes [provider]  midodrine (PROAMATINE) 10 MG tablet Take 1 tablet (10 mg total) by mouth 3 (three) times daily with meals. 12/22/17  Yes Croitoru, Mihai, MD  pantoprazole (PROTONIX) 40 MG tablet Take 1 tablet (40 mg total) by mouth 2 (two) times daily. Patient taking differently: Take 40 mg by mouth daily.  03/08/18  Yes Regalado, Belkys A, MD  polyethylene glycol (MIRALAX / GLYCOLAX) packet Take 17 g by mouth daily as needed for mild constipation. 05/21/17  Yes Bonnielee Haff, MD  potassium chloride SA (K-DUR,KLOR-CON) 20 MEQ tablet Take 1 tablet (20 mEq total) by mouth daily. TAKE WHEN TAKING YOUR LASIX Patient taking differently: Take 20 mEq by mouth daily as needed (only when taking Lasix for 3-5 lb. weight gain).  06/22/17  Yes Croitoru, Mihai, MD  PROAIR HFA 108 [phone] Base) MCG/ACT inhaler Inhale 2 puffs into the lungs every 4-6 hours as needed for shortness of breath or wheezing 03/02/18  Yes [provider]  Probiotic Product (PROBIOTIC PO) Take 1 capsule by mouth daily. 30 BILLION strength   Yes [provider]  amoxicillin-clavulanate (AUGMENTIN) 875-125 MG tablet Take 1 tablet by mouth every 12 (twelve) hours. Patient not taking: Reported on 06/14/2018 03/08/18   Elmarie Shiley, MD  Elastic Bandages & Supports (ABDOMINAL BINDER/ELASTIC MED) MISC Wear daily 02/12/18   Croitoru, Dani Gobble, MD  predniSONE (DELTASONE) 20 MG tablet Take 2 tablet for 2 days then 1 tablet for 2 days then stop. Patient not taking: Reported on 06/14/2018 03/08/18   Regalado, Jerald Kief A, MD  tamsulosin (FLOMAX) 0.4 MG CAPS capsule Take 1 capsule (0.4 mg total) by mouth daily after supper. Patient not taking: Reported on 06/14/2018 05/21/17   Bonnielee Haff, MD   Allergies    Allergen Reactions  . Other Other (See Comments)    General anesthesia - sensitivity,  causes severe lethargy   . Septra [Sulfamethoxazole-Trimethoprim] Hives  . Tramadol Other (See Comments)    Caused low b/p   . Sulfa Antibiotics Rash   Review of Systems  Unable to perform ROS   Physical Exam  Constitutional: Vital signs are normal. He has a sickly appearance.  Thin and frail in appearance   Cardiovascular: Normal rate, regular rhythm and normal heart sounds. Exam reveals decreased pulses.  Pulmonary/Chest: He has decreased breath sounds.  3L/[place], some shortness of breath   Musculoskeletal:  Generalized weakness   Neurological: He is disoriented. He displays atrophy.  Altered mental status   Skin: Skin is warm and dry. Abrasion and bruising noted.  Psychiatric: Cognition and memory are impaired. He expresses inappropriate judgment.  Nursing note and vitals reviewed.   Vital Signs: BP (!) 151/60 (BP Location: Right  Arm)   Pulse 85   Temp 98.8 F (37.1 C) (Axillary)   Resp 20   Ht '5\' 11"'$  (1.803 m)   Wt 71.9 kg (158 lb 8.2 oz)   SpO2 90%   BMI 22.11 kg/m  Pain Scale: Faces   Pain Score: 3    SpO2: SpO2: 90 % O2 Device:SpO2: 90 % O2 Flow Rate: .O2 Flow Rate (L/min): 14 L/min  IO: Intake/output summary:   Intake/Output Summary (Last 24 hours) at 06/17/2018 1106 Last data filed at 06/17/2018 0900 Gross per 24 hour  Intake 4794.86 ml  Output -  Net 4794.86 ml    LBM: Last BM Date: 06/16/18 Baseline Weight: Weight: 72.6 kg (160 lb) Most recent weight: Weight: 71.9 kg (158 lb 8.2 oz)     Palliative Assessment/Data:PPS 20 %   Time In: 1000 Time Out: 1115 Time Total: 75 min.   Greater than 50%  of this time was spent counseling and coordinating care related to the above assessment and plan.  Signed by:  Alda Lea, NP-BC Palliative Medicine Team  Phone: [phone] Fax: [phone] Pager: [phone] Amion: Bjorn Pippin    Please  contact Palliative Medicine Team phone at [phone] for questions and concerns.  For individual provider: See Shea Evans

## 2018-06-17 NOTE — Discharge Summary (Addendum)
Discharge Summary  Bruce Green UYQ:[id number] DOB: February 05, 1919  PCP: Bruce Lass, MD  Admit date: 06/14/2018 Discharge date: 06/18/2018  Time spent: 45 mins  Recommendations for Outpatient Follow-up:  1. Home hospice   Discharge Diagnoses:  Active Hospital Problems   Diagnosis Date Noted  . PNA (pneumonia) 06/14/2018  . Acute on chronic respiratory failure with hypoxia ([place]) 06/14/2018  . Atrial fibrillation ([place])   . AAA (abdominal aortic aneurysm) without rupture ([place]) 05/14/2017  . S/P CABG (coronary artery bypass graft) 06/08/2014  . CAD (coronary artery disease) 04/28/2014  . Systolic CHF with reduced left ventricular function, NYHA class 2 ([place]) 04/19/2014    Resolved Hospital Problems  No resolved problems to display.    Discharge Condition: Poor  Diet recommendation: TBD by hospice  Vitals:   06/17/18 0543 06/17/18 0824  BP: (!) 151/60   Pulse: 85   Resp: 20   Temp: 98.8 F (37.1 C)   SpO2: 91% 90%    History of present illness:  Bruce Green a 84 y.o.Mwith hx CKD baseline Cr 1.3, chronic respiratory failure from MR and CHF EF 25%, Afib on Eliquis who presents with 3 months progressive functional decline, and now several days new worse SOB, increased hypoxia and weakness. Pt admitted for further management.  Pt had a possible near-syncope episode on 06/17/18, that lasted for few secs as witnessed by caregiver. Pt also noted to be significantly hypoxic as well, requiring more O2. Likely has some silent aspiration. Unable to answer questions appropriately/unable to perform ROS. Palliative care discussed with daughters who agreed on transitioning to home hospice care. Pt wasn't discharged home yesterday as they were waiting on hospital bed to be discharged at home. Met pt at beside, daughter stated he wanted to eat and had breakfast this am. Resting in bed comfortably. Plan for d/c home today, once hospital bed at home.  Hospital Course:  Principal  Problem:   PNA (pneumonia) Active Problems:   Systolic CHF with reduced left ventricular function, NYHA class 2 (HCC)   CAD (coronary artery disease)   S/P CABG (coronary artery bypass graft)   AAA (abdominal aortic aneurysm) without rupture (HCC)   Atrial fibrillation (HCC)   Acute on chronic respiratory failure with hypoxia ([place])  Sepsis from community acquired pneumonia/Aspiration PNA of the RUL Currently afebrile, with mild leukocytosis Urine strep pneumo BC X 2 NGTD CXR showed RUL CAP, repeat shows worsening of pneumonia SLP on board S/P IV Vancomycin, Cefepime, doxycycline for atypical coverage, will not discharge on any PO meds as pt is currently hospice  Acute metabolic encephalopathy/Delirium Worsened At baseline, ??dementia, with ongoing delirium likely worsened by PNA Delirium precautions Started risperidone QHS, hospice to determine if needed  Acute on chronic respiratory failure Worsened  On baseline 2L O2 at home, requiring >4L now Continue supplemental O2, duoneb Home hospice  Atrial fibrillation, paroxysmal Rate controlled CHA2DS2-Vasc6 Hospice to decide if Eliquis, amiodarone needs to be continued  Hypertension/Chronic systolic CHF/Mitral regurgitation HTN stable ECHO showed EF of 25-30%, diffuse hypokinesis, Grade 1DD Held home furosemide PRN for now Discontinued statin  CKD III Baseline Cr 1.4 Currently better than baseline  Orthostatic hypotension Discontinuemidodrine and droxidopa      Procedures:  None  Consultations:  Palliative and hospice care  Discharge Exam: BP (!) 151/60 (BP Location: Right Arm)   Pulse 85   Temp 98.8 F (37.1 C) (Axillary)   Resp 20   Ht '5\' 11"'$  (1.803 m)   Wt 71.9 kg (158 lb  8.2 oz)   SpO2 90%   BMI 22.11 kg/m   General: NAD Cardiovascular: S1, S2 present  Respiratory: Coarse BS bilaterally  Discharge Instructions You were cared for by a hospitalist during your hospital stay. If you  have any questions about your discharge medications or the care you received while you were in the hospital after you are discharged, you can call the unit and asked to speak with the hospitalist on call if the hospitalist that took care of you is not available. Once you are discharged, your primary care physician will handle any further medical issues. Please note that NO REFILLS for any discharge medications will be authorized once you are discharged, as it is imperative that you return to your primary care physician (or establish a relationship with a primary care physician if you do not have one) for your aftercare needs so that they can reassess your need for medications and monitor your lab values.   Allergies as of 06/17/2018      Reactions   Other Other (See Comments)   General anesthesia - sensitivity,  causes severe lethargy    Septra [sulfamethoxazole-trimethoprim] Hives   Tramadol Other (See Comments)   Caused low b/p   Sulfa Antibiotics Rash      Medication List    STOP taking these medications   Abdominal Binder/Elastic Med Misc   amoxicillin 875 MG tablet Commonly known as:  AMOXIL   amoxicillin-clavulanate 875-125 MG tablet Commonly known as:  AUGMENTIN   atorvastatin 80 MG tablet Commonly known as:  LIPITOR   B-complex with vitamin C tablet   Droxidopa 100 MG Caps Commonly known as:  NORTHERA   fluticasone 50 MCG/ACT nasal spray Commonly known as:  FLONASE   folic acid 536 MCG tablet Commonly known as:  FOLVITE   guaiFENesin 600 MG 12 hr tablet Commonly known as:  MUCINEX   loratadine 10 MG tablet Commonly known as:  CLARITIN   Magnesium 400 MG Caps   midodrine 10 MG tablet Commonly known as:  PROAMATINE   pantoprazole 40 MG tablet Commonly known as:  PROTONIX   potassium chloride SA 20 MEQ tablet Commonly known as:  K-DUR,KLOR-CON   predniSONE 20 MG tablet Commonly known as:  DELTASONE   PROBIOTIC PO   tamsulosin 0.4 MG Caps  capsule Commonly known as:  FLOMAX   Vitamin D3 5000 units Tabs     TAKE these medications   acetaminophen 500 MG tablet Commonly known as:  TYLENOL Take 500-1,000 mg by mouth 2 (two) times daily as needed for moderate pain.   amiodarone 200 MG tablet Commonly known as:  PACERONE Take 1 tablet (200 mg total) by mouth daily.   apixaban 2.5 MG Tabs tablet Commonly known as:  ELIQUIS Take 1 tablet (2.5 mg total) by mouth 2 (two) times daily.   furosemide 40 MG tablet Commonly known as:  LASIX Take 0.5 tablets (20 mg total) by mouth daily as needed. FOR WT GAIN >3# IN 1 DAY  -OR- >5# IN A WEEK What changed:    reasons to take this  additional instructions   ipratropium-albuterol 0.5-2.5 (3) MG/3ML Soln Commonly known as:  DUONEB Take 3 mLs by nebulization 3 (three) times daily.   Melatonin 5 MG Tabs Take 5-10 mg by mouth at bedtime as needed (for sleep).   polyethylene glycol packet Commonly known as:  MIRALAX / GLYCOLAX Take 17 g by mouth daily as needed for mild constipation.   PROAIR HFA 108 (90 Base) MCG/ACT  inhaler Generic drug:  albuterol Inhale 2 puffs into the lungs every 4-6 hours as needed for shortness of breath or wheezing      Allergies  Allergen Reactions  . Other Other (See Comments)    General anesthesia - sensitivity,  causes severe lethargy   . Septra [Sulfamethoxazole-Trimethoprim] Hives  . Tramadol Other (See Comments)    Caused low b/p   . Sulfa Antibiotics Rash   Follow-up Information    [facility] Follow up.   Why:  A representative from [facility] will contact you to arrange start of Care. Contact information: [address] [place] [address] [phone]            The results of significant diagnostics from this hospitalization (including imaging, microbiology, ancillary and laboratory) are listed below for reference.    Significant Diagnostic Studies: Ct Head Wo Contrast  Result Date:  06/14/2018 CLINICAL DATA:  Headache with increased confusion EXAM: CT HEAD WITHOUT CONTRAST TECHNIQUE: Contiguous axial images were obtained from the base of the skull through the vertex without intravenous contrast. COMPARISON:  02/04/2018 head CT FINDINGS: Brain: No acute territorial infarction, hemorrhage, or intracranial mass is visualized. There is moderate atrophy. Minimal small vessel ischemic changes of the white matter. Stable ventricle size. Vascular: No hyperdense vessels.  Carotid vascular calcification. Skull: Normal. Negative for fracture or focal lesion. Sinuses/Orbits: No acute finding. Chronic appearing nasal bone deformity. Other: None IMPRESSION: 1. No CT evidence for acute intracranial abnormality. 2. Atrophy and mild small vessel ischemic changes of the white matter Electronically Signed   By: Donavan Foil M.D.   On: 06/14/2018 19:53   Dg Chest Port 1 View  Result Date: 06/17/2018 CLINICAL DATA:  Pneumonia EXAM: PORTABLE CHEST 1 VIEW COMPARISON:  June 14, 2018 and March 04, 2018 FINDINGS: There is widespread reticular opacity throughout the right lung and left base regions, similar to recent study. Heart is mildly enlarged with pulmonary vascularity normal. There is aortic atherosclerosis. Patient is status post coronary artery bypass grafting. There is degenerative change in each shoulder. IMPRESSION: Persistent widespread reticular opacities with both interstitial and patchy alveolar opacity. Suspect widespread pneumonia versus aspiration. Both entities may exist concurrently. An atypical pulmonary edema pattern such as allergic type phenomenon is a differential consideration. Stable cardiac prominence. No adenopathy evident. There is aortic atherosclerosis. Status post coronary artery bypass grafting. Aortic Atherosclerosis (ICD10-I70.0). Electronically Signed   By: Lowella Grip III M.D.   On: 06/17/2018 08:35   Dg Chest Port 1 View  Result Date: 06/14/2018 CLINICAL DATA:   Altered mental status EXAM: PORTABLE CHEST 1 VIEW COMPARISON:  Chest radiograph 03/04/2018 FINDINGS: Reticular opacities throughout much of the right lung, worst in the right upper lobe. No pleural effusion. Remote median sternotomy with mild cardiomegaly. IMPRESSION: Reticular opacities, greatest in the right upper lobe, concerning for pneumonia or aspiration. Electronically Signed   By: Ulyses Jarred M.D.   On: 06/14/2018 18:05    Microbiology: Recent Results (from the past 240 hour(s))  Blood Culture (routine x 2)     Status: None (Preliminary result)   Collection Time: 06/14/18  5:11 PM  Result Value Ref Range Status   Specimen Description BLOOD LEFT ANTECUBITAL  Final   Special Requests   Final    BOTTLES DRAWN AEROBIC AND ANAEROBIC Blood Culture adequate volume   Culture   Final    NO GROWTH 3 DAYS Performed at [facility], [address], [place], [place]  [address]    Report Status PENDING  Incomplete  Blood Culture (routine x 2)     Status: None (Preliminary result)   Collection Time: 06/14/18  5:39 PM  Result Value Ref Range Status   Specimen Description BLOOD RIGHT WRIST  Final   Special Requests   Final    BOTTLES DRAWN AEROBIC ONLY Blood Culture results may not be optimal due to an inadequate volume of blood received in culture bottles   Culture   Final    NO GROWTH 3 DAYS Performed at [facility], [place] [address], [place], [place] [address]    Report Status PENDING  Incomplete  MRSA PCR Screening     Status: Abnormal   Collection Time: 06/15/18 12:04 PM  Result Value Ref Range Status   MRSA by PCR POSITIVE (A) NEGATIVE Final    Comment:        The GeneXpert MRSA Assay (FDA approved for NASAL specimens only), is one component of a comprehensive MRSA colonization surveillance program. It is not intended to diagnose MRSA infection nor to guide or monitor treatment for MRSA infections. RESULT CALLED TO, READ BACK BY AND VERIFIED WITH: Burna Mortimer RN  14:00 06/15/18 ([email or web address]) Performed at [facility], [place] [address] [place], [place] [address]      Labs: Basic Metabolic Panel: Recent Labs  Lab 06/14/18 1710 06/15/18 0841 06/16/18 0525 06/17/18 0405  NA 143 144 145 147*  K 4.0 3.6 3.7 3.5  CL 108 108 114* 111  CO2 '25 25 23 26  '$ GLUCOSE 126* 150* 117* 93  BUN 27* 25* 24* 25*  CREATININE 1.43* 1.27* 1.26* 1.26*  CALCIUM 8.2* 8.1* 8.0* 8.2*   Liver Function Tests: Recent Labs  Lab 06/14/18 1710  AST 76*  ALT 67*  ALKPHOS 62  BILITOT 0.7  PROT 4.6*  ALBUMIN 2.3*   No results for input(s): LIPASE, AMYLASE in the last 168 hours. No results for input(s): AMMONIA in the last 168 hours. CBC: Recent Labs  Lab 06/14/18 1710 06/15/18 0841 06/16/18 0525 06/17/18 0405  WBC 10.0 10.1 9.3 10.7*  NEUTROABS 8.3* 8.0*  --  8.7*  HGB 12.3* 11.5* 11.2* 11.6*  HCT 39.7 38.0* 37.8* 36.7*  MCV 88.6 89.4 91.7 86.8  PLT 338 326 333 300   Cardiac Enzymes: Recent Labs  Lab 06/14/18 1710  TROPONINI <0.03   BNP: BNP (last 3 results) Recent Labs    03/06/18 0400 06/15/18 0841  BNP 751.4* 290.8*    ProBNP (last 3 results) No results for input(s): PROBNP in the last 8760 hours.  CBG: Recent Labs  Lab 06/17/18 0741  GLUCAP 129*       Signed:  Alma Friendly, MD [facility] 06/17/2018, 3:41 PM

## 2018-06-17 NOTE — Progress Notes (Signed)
The sitter reported that the patient "passed out and had drainage coming out of his mouth".  Rapid response was called.  Patient was sitting in a recliner.  Color pale.  Respirations shallow.  Oxygen was placed and the patient slowly started responding to verbal stimuli.  Oxygen saturation was maintaining 92 -95% with 55% venti.  Patient is talking with the staff. Dr Horris Latino was notified of the patient's episode and was given an update of progress.  BP 120/80 pulse 88 respirations 24  Sitter remains with the patient along with Mel, RN (rapid response team)

## 2018-06-18 LAB — BASIC METABOLIC PANEL
ANION GAP: 10 (ref 5–15)
BUN: 33 mg/dL — ABNORMAL HIGH (ref 8–23)
CALCIUM: 8.2 mg/dL — AB (ref 8.9–10.3)
CO2: 28 mmol/L (ref 22–32)
Chloride: 109 mmol/L (ref 98–111)
Creatinine, Ser: 1.49 mg/dL — ABNORMAL HIGH (ref 0.61–1.24)
GFR calc non Af Amer: 38 mL/min — ABNORMAL LOW (ref >60)
GFR, EST AFRICAN AMERICAN: 44 mL/min — AB (ref >60)
Glucose, Bld: 120 mg/dL — ABNORMAL HIGH (ref 70–99)
Potassium: 3.4 mmol/L — ABNORMAL LOW (ref 3.5–5.1)
Sodium: 147 mmol/L — ABNORMAL HIGH (ref 135–145)

## 2018-06-18 LAB — CBC WITH DIFFERENTIAL/PLATELET
ABS IMMATURE GRANULOCYTES: 0.1 10*3/uL (ref 0.0–0.1)
BASOS ABS: 0 10*3/uL (ref 0.0–0.1)
BASOS PCT: 0 %
Eosinophils Absolute: 0.1 10*3/uL (ref 0.0–0.7)
Eosinophils Relative: 1 %
HCT: 38.9 % — ABNORMAL LOW (ref 39.0–52.0)
Hemoglobin: 12.1 g/dL — ABNORMAL LOW (ref 13.0–17.0)
IMMATURE GRANULOCYTES: 0 %
Lymphocytes Relative: 11 %
Lymphs Abs: 1.3 10*3/uL (ref 0.7–4.0)
MCH: 26.6 pg (ref 26.0–34.0)
MCHC: 31.1 g/dL (ref 30.0–36.0)
MCV: 85.5 fL (ref 78.0–100.0)
MONO ABS: 1.1 10*3/uL — AB (ref 0.1–1.0)
Monocytes Relative: 9 %
NEUTROS ABS: 9.7 10*3/uL — AB (ref 1.7–7.7)
NEUTROS PCT: 79 %
PLATELETS: 269 10*3/uL (ref 150–400)
RBC: 4.55 MIL/uL (ref 4.22–5.81)
RDW: 14.7 % (ref 11.5–15.5)
WBC: 12.2 10*3/uL — AB (ref 4.0–10.5)

## 2018-06-18 MED ORDER — MORPHINE SULFATE (CONCENTRATE) 10 MG/0.5ML PO SOLN
2.5000 mg | Freq: Once | ORAL | Status: AC
  Administered 2018-06-18: 2.6 mg via ORAL
  Filled 2018-06-18: qty 0.5

## 2018-06-18 MED ORDER — SODIUM CHLORIDE 0.9 % IV SOLN
1.0000 g | INTRAVENOUS | Status: DC
  Filled 2018-06-18: qty 1

## 2018-06-18 NOTE — Care Management Important Message (Signed)
Important Message  Patient Details  Name: Bruce Green MRN: [id number] Date of Birth: 10/06/1919   Medicare Important Message Given:  Yes    [name] 06/18/2018, 3:14 PM

## 2018-06-18 NOTE — Progress Notes (Signed)
Upon getting my report together for next shift, looking up patients oxygen needs at the time I noticed sats of 82 and of 74 charted, however, no change in oxygen delivery device and RT was not notified. Upon checking on patient, sats were 74% on 4 Lpm nasal cannula. Placed patient on NRB, sats up to 97%. RN notified.

## 2018-06-18 NOTE — Progress Notes (Signed)
911 communication's called- [facility] notified of the need for transportation to the patient's home.

## 2018-06-18 NOTE — Progress Notes (Signed)
Daily Progress Note   Patient Name: Bruce Green       Date: 06/18/2018 DOB: 1918-12-02  Age: 84 y.o. MRN#: [id number] Attending Physician: Alma Friendly, MD Primary Care Physician: Kathyrn Lass, MD Admit Date: 06/14/2018  Reason for Consultation/Follow-up: Disposition, Non pain symptom management, Pain control and Psychosocial/spiritual support  Subjective: Patient lying in bed.  He seems somewhat restless.  He is somewhat more awake on today.  He denies pain.  States he wants nonrebreather taken off. Daughter at bedside.  Nonrebreather was removed and patient was placed on 4 L/[place].  She verbalized appreciation.  Daughter received a call informing her that hospital bed was being delivered and at this time she is wishing to have her father transported home.  We discussed patient being restless and appears somewhat short of breath even prior to removal of nonrebreather.  Educated daughter on symptom management and expressed wishes for patient to be kept as comfortable as possible. Advised daughter given patient's appearance and signs of shortness of breath especially when repositioned in bed, would recommend she receive medication prior to discharge to allow for comfort during home transport.  Daughter verbalized understanding and agreement.  Daughter reports she has been in contact with hospice and they plan to come out to the home to see her once her father arrives.  He denies any further questions or concerns at this time.  Length of Stay: 3  Current Medications: Scheduled Meds:  . amiodarone  200 mg Oral Daily  . apixaban  2.5 mg Oral BID  . atorvastatin  40 mg Oral Daily  . Droxidopa  100 mg Oral TID  . ipratropium-albuterol  3 mL Nebulization TID  . midodrine  10 mg Oral TID WC  .  morphine CONCENTRATE  2.6 mg Oral Once  . risperiDONE  0.25 mg Oral QHS  . sodium chloride flush  3 mL Intravenous Q12H    Continuous Infusions: . sodium chloride    . ceFEPime (MAXIPIME) IV    . doxycycline (VIBRAMYCIN) IV 100 mg (06/17/18 0422)  . vancomycin 1,000 mg (06/16/18 2259)    PRN Meds: sodium chloride, albuterol, furosemide, Melatonin, sodium chloride flush  Physical Exam  Constitutional: He has a sickly appearance.  Thin frail in appearance  Cardiovascular: Normal heart sounds. Tachycardia present. Exam reveals decreased pulses.  Pulmonary/Chest: He has decreased breath sounds.  Shortness of breath, 4L/[place]  Neurological: He is alert. He displays atrophy.  Psychiatric: Cognition and memory are impaired. He expresses inappropriate judgment.  Nursing note and vitals reviewed.        Vital Signs: BP 123/62 (BP Location: Right Arm)   Pulse 84   Temp 97.6 F (36.4 C) (Oral)   Resp (!) 28   Ht 5\' 11"  (1.803 m)   Wt 71.9 kg (158 lb 8.2 oz)   SpO2 98%   BMI 22.11 kg/m  SpO2: SpO2: 98 % O2 Device: O2 Device: NRB O2 Flow Rate: O2 Flow Rate (L/min): 15 L/min  Intake/output summary:   Intake/Output Summary (Last 24 hours) at 06/18/2018 1144 Last data filed at 06/17/2018 1800 Gross per 24 hour  Intake 0 ml  Output -  Net 0 ml   LBM: Last BM Date: 06/16/18 Baseline Weight: Weight: 72.6 kg (160 lb) Most recent weight: Weight: 71.9 kg (158 lb 8.2 oz)      Palliative Assessment/Data:PPS 20%   Patient Active Problem List   Diagnosis Date Noted  . PNA (pneumonia) 06/14/2018  . Acute on chronic respiratory failure with hypoxia ([place]) 06/14/2018  . CKD (chronic kidney disease) stage 3, GFR 30-59 ml/min (HCC)   . Thyroid nodule 03/04/2018  . Dyslipidemia 03/04/2018  . CAP (community acquired pneumonia) 03/04/2018  . Atrial fibrillation ([place])   . Autonomic dysfunction 02/12/2018  . Syncope and collapse 07/14/2017  . Urinary tract infection 07/14/2017  . Mitral  regurgitation 07/13/2017  . Syncope 07/13/2017  . Acute lower UTI 07/13/2017  . AAA (abdominal aortic aneurysm) without rupture ([place]) 05/14/2017  . Community acquired pneumonia 05/13/2017  . Gross hematuria 05/13/2017  . Elevated LFTs 05/13/2017  . Hyperbilirubinemia 05/13/2017  . Pain in joint of left shoulder 05/13/2017  . S/P CABG (coronary artery bypass graft) 06/08/2014  . Orthostatic hypotension 06/08/2014  . SOB (shortness of breath) 05/17/2014  . Eye infection 05/17/2014  . CAD (coronary artery disease) 04/28/2014  . Cardiomyopathy, ischemic 04/25/2014  . S/P CABG x 3 04/24/2014  . Cardiomyopathy of undetermined type ([place]) 04/19/2014  . Systolic CHF with reduced left ventricular function, NYHA class 2 ([place]) 04/19/2014  . Dyspnea on exertion 04/10/2014  . Murmur 04/10/2014  . Cardiomegaly 04/10/2014  . Coronary artery calcification seen on CAT scan 04/10/2014  . GERD with stricture 04/09/2012    Palliative Care Assessment & Plan   Patient Profile: 84 y.o. male admitted on 06/14/2018 from home with complaints of weakness, altered mental status, and shortness of breath.  He has a past medical history significant for hypertension, anxiety, coronary artery disease, GERD, TIA, dyslipidemia, ischemic cardiomyopathy (EF 19-50%), and systolic CHF.  According to patient's daughter he was started on amoxicillin by his primary care physician several days ago.  At home his oxygen saturations were low and he was requiring 4 L of oxygen with saturations in the mid 90s.  Family reported he has been more confused than normal and does not have a history of dementia.  He was hospitalized back in April of this year for questionable aspiration pneumonia and was treated with Augmentin.  In the ED patient had blood cultures completed and was started on IV vancomycin and Zosyn.  Chest x-ray showed right upper lobe community-acquired pneumonia questionable aspiration.  Palliative care team consulted for  goals of care discussion.  Assessment: Patient is somewhat more alert on today.  Requesting to have NRB removed.  Support was given and  nasal cannula was placed.  Patient is short of breath with use of accessory muscles with staff repositioning in bed.  Daughter reports he has awakened several times and requested something to drink.  He tolerated small amount of liquids for comfort.  Recommendations/Plan:  DNR/DNI-as confirmed by daughter  Hospice has delivered requested equipment to patient's home.  Daughter is at the bedside and verbalizes she would now like patient to be transported home via EMS services.  She denies any further questions or concerns.  States she has been in contact with hospice and they will meet patient once he arrives home.  Discussed with daughter patient appearance of being uncomfortable and short of breath with minimum exertion.  Advised the patient would benefit from a dose of morphine prior to discharge to assist with comfort during his transition home.  Daughter verbalized understanding and appreciation.  Orders placed and RN aware to administer Roxanol one-time dose prior to discharge.  Patient does not have any IV access.  Case management has secured outpatient hospice services.  Goals of Care and Additional Recommendations:  Limitations on Scope of Treatment: Full Comfort Care  Code Status:    Code Status Orders  (From admission, onward)        Start     Ordered   06/16/18 1126  Do not attempt resuscitation (DNR)  Continuous    Question Answer Comment  In the event of cardiac or respiratory ARREST Do not call a "code blue"   In the event of cardiac or respiratory ARREST Do not perform Intubation, CPR, defibrillation or ACLS   In the event of cardiac or respiratory ARREST Use medication by any route, position, wound care, and other measures to relive pain and suffering. May use oxygen, suction and manual treatment of airway obstruction as needed for  comfort.      06/16/18 1125    Code Status History    Date Active Date Inactive Code Status Order ID Comments User Context   06/14/2018 2027 06/16/2018 1125 Full Code [id number]  Phillips Grout, MD ED   03/04/2018 2230 03/09/2018 0100 Full Code [id number]  Quintella Baton, MD ED   07/13/2017 2007 07/15/2017 1542 Full Code [id number]  Etta Quill, DO ED   05/13/2017 2012 05/21/2017 1945 Full Code [id number]  Vianne Bulls, MD Inpatient   04/24/2014 1156 04/26/2014 1619 Full Code [id number]  Coolidge Breeze, PA-C Inpatient   04/21/2014 1640 04/24/2014 1156 Full Code [id number]  Burnell Blanks, MD Inpatient     Prognosis:   < 4 weeks < 4 weeks-in the setting of pneumonia, systolic CHF, atrial fibrillation, altered mental status, immobility, poor p.o. intake, hypertension, acute on chronic respiratory failure with hypoxia, CAD status post CABG, EF 25-30%, and chronic kidney disease.  Discharge Planning:  Home with Hospice  Care plan was discussed with patient's family, bedside RN, case management, and Dr. Horris Latino.   Thank you for allowing the Palliative Medicine Team to assist in the care of this patient.   Total Time 25 min.  Prolonged Time Billed NO        Greater than 50%  of this time was spent counseling and coordinating care related to the above assessment and plan.  Alda Lea, NP-BC Palliative Medicine Team  Phone: [phone] Fax: [phone] Pager: [phone] Amion: Bjorn Pippin   Please contact Palliative Medicine Team phone at [phone] for questions and concerns.

## 2018-06-19 LAB — CULTURE, BLOOD (ROUTINE X 2)
Culture: NO GROWTH
Culture: NO GROWTH
Special Requests: ADEQUATE

## 2018-06-24 DEATH — deceased

## 2018-07-13 ENCOUNTER — Ambulatory Visit: Payer: Medicare Other | Admitting: Cardiovascular Disease

## 2018-08-27 IMAGING — DX DG CHEST 2V
3 series · 3 of 3 positions shown · non-contrast
Comparison: May 12, 2017

CLINICAL DATA: Pain in the left shoulder and productive cough

EXAM:
CHEST  2 VIEW

[x chest ap]
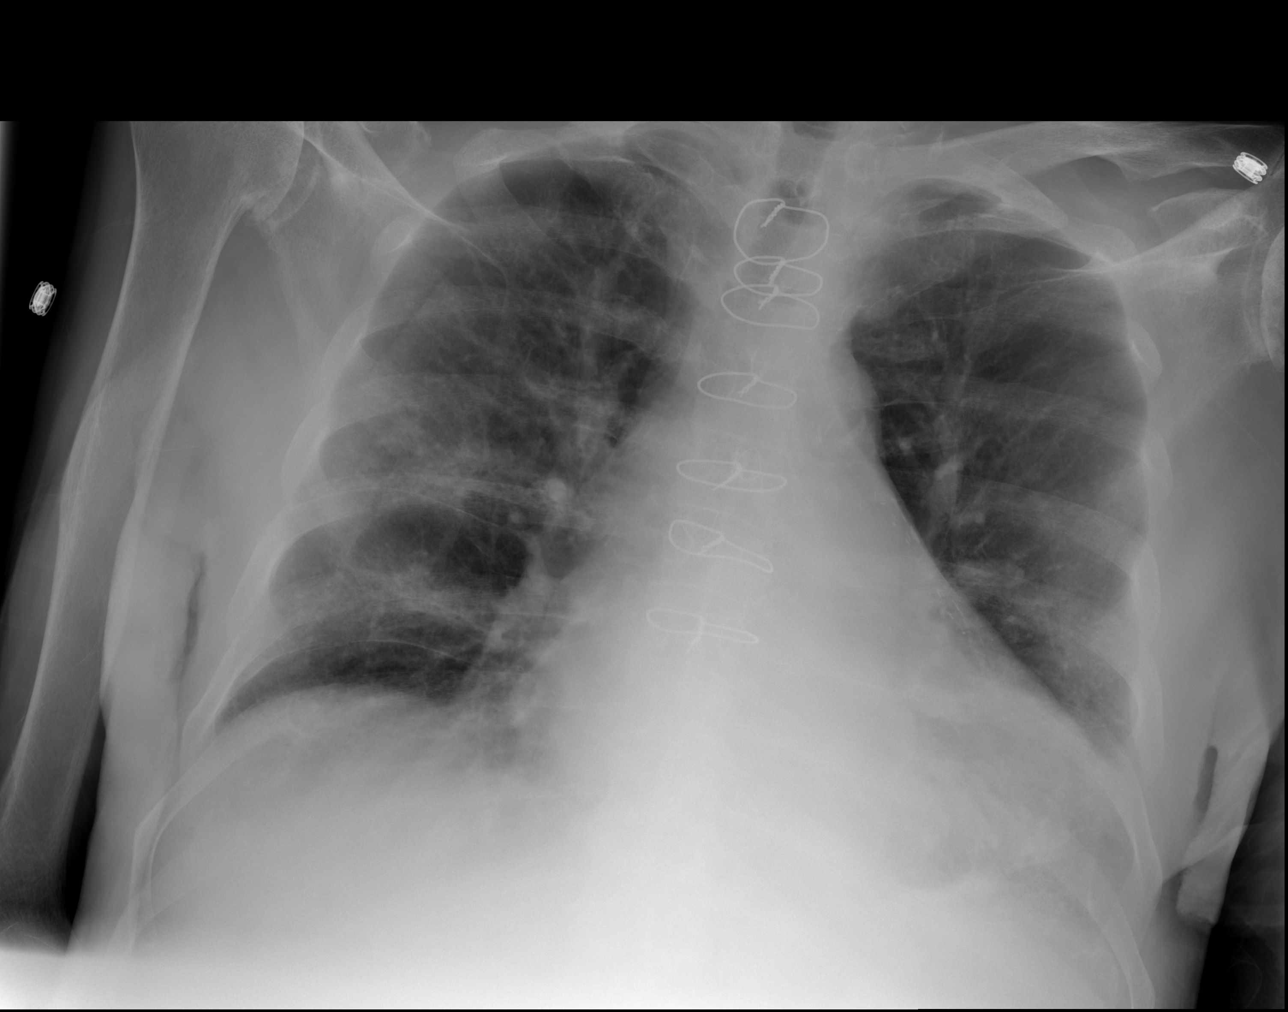

[w chest lat (1 of 2)]
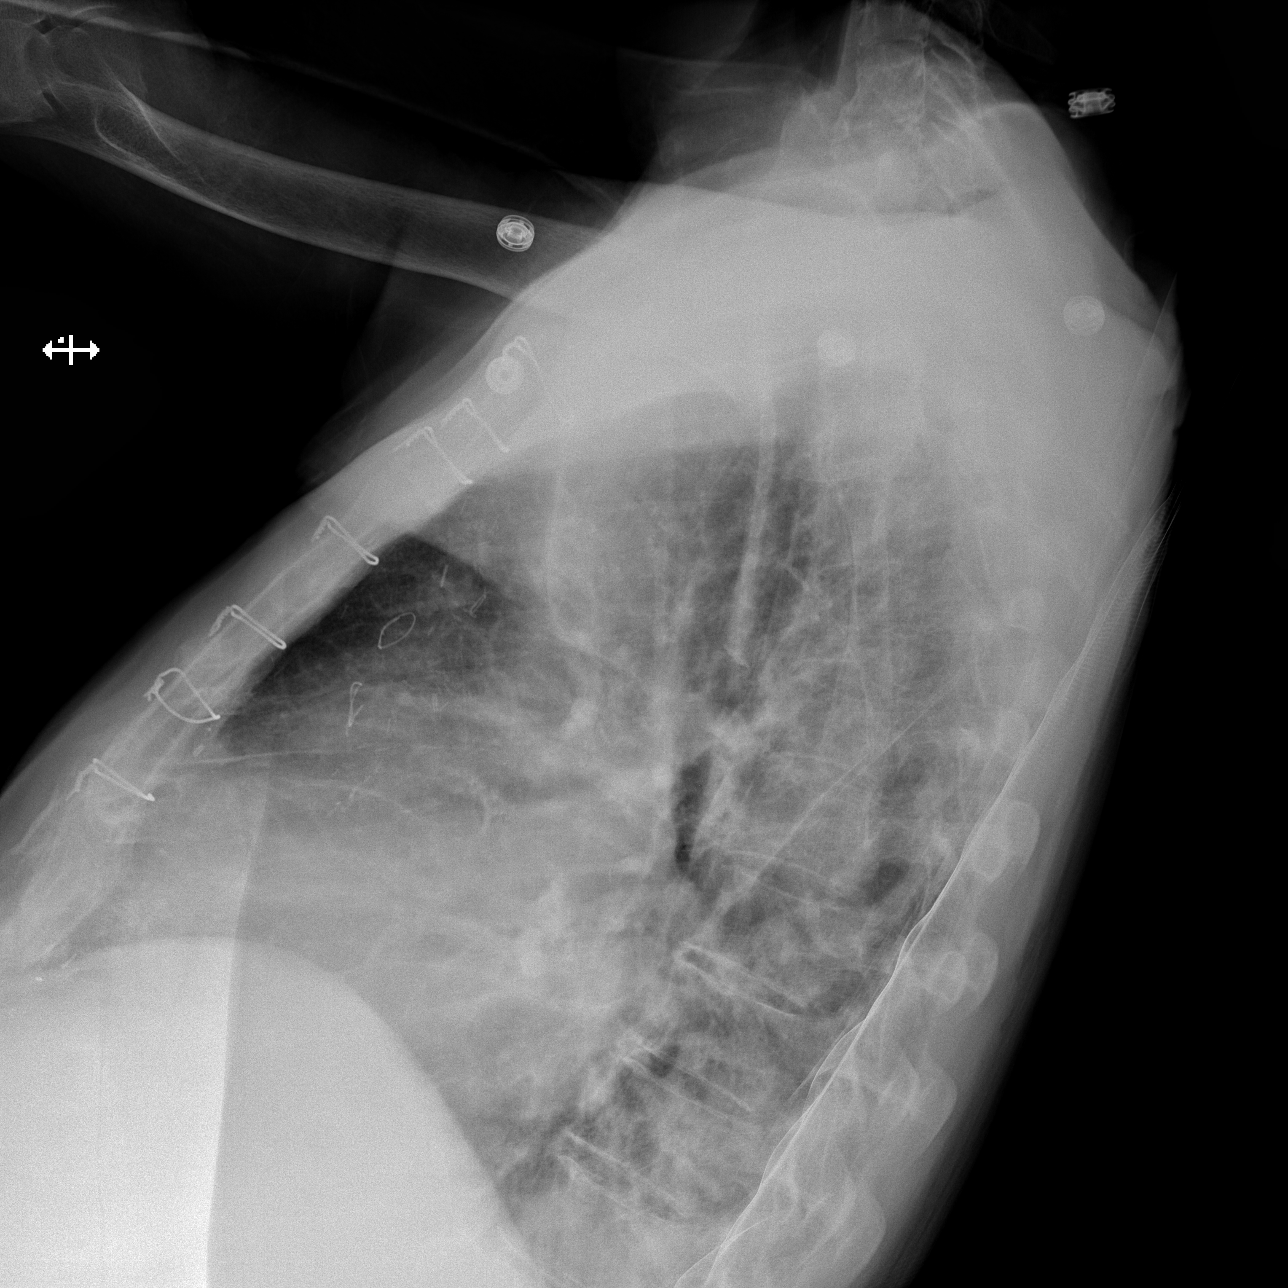

[w chest lat (2 of 2)]
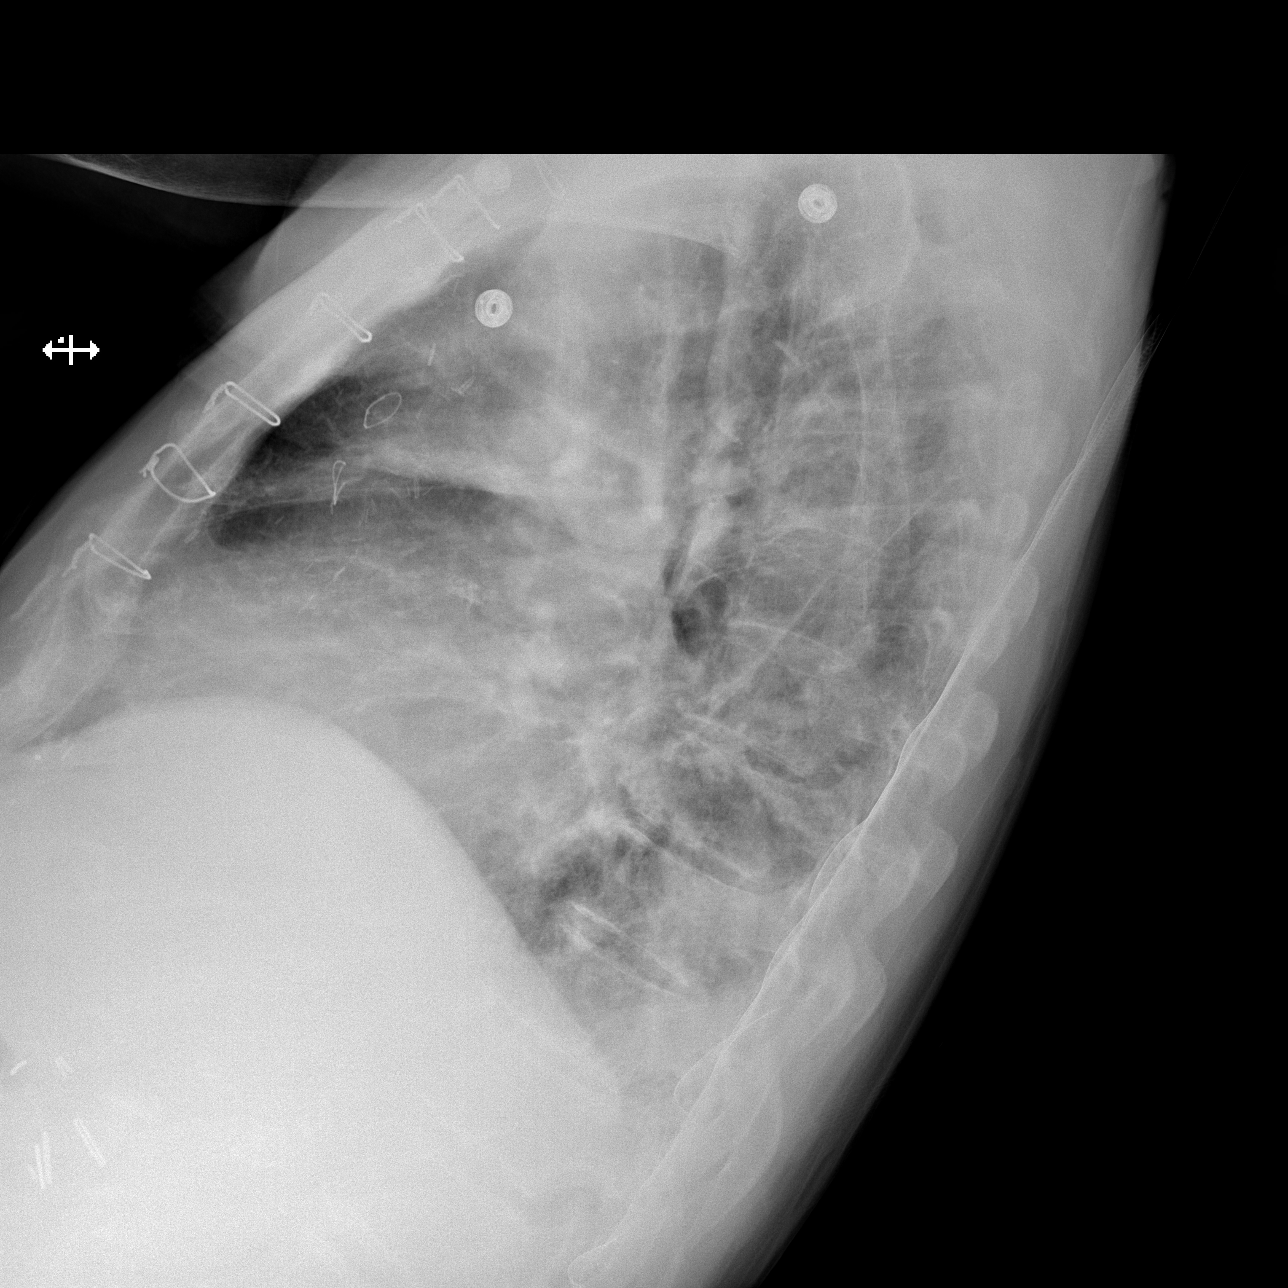

[3 of 3 positions shown; findings below may reference images not displayed]

FINDINGS: The mediastinal contour is normal. Patient status post prior median
sternotomy and CABG. The heart size is enlarged. Mild patchy opacity
is identified in the right mid and lung base. There is no pulmonary
edema. There is minimal left pleural effusion. The visualized
skeletal structures are stable.
IMPRESSION: Mild patchy opacity of the right mid and lung base, developing
pneumonia is not excluded.

## 2018-08-27 IMAGING — DX DG SHOULDER 2+V*L*
2 series · 2 of 2 positions shown · non-contrast
Comparison: None.

CLINICAL DATA: Left shoulder pain

EXAM:
LEFT SHOULDER - 2+ VIEW

[x shoulder ap left]
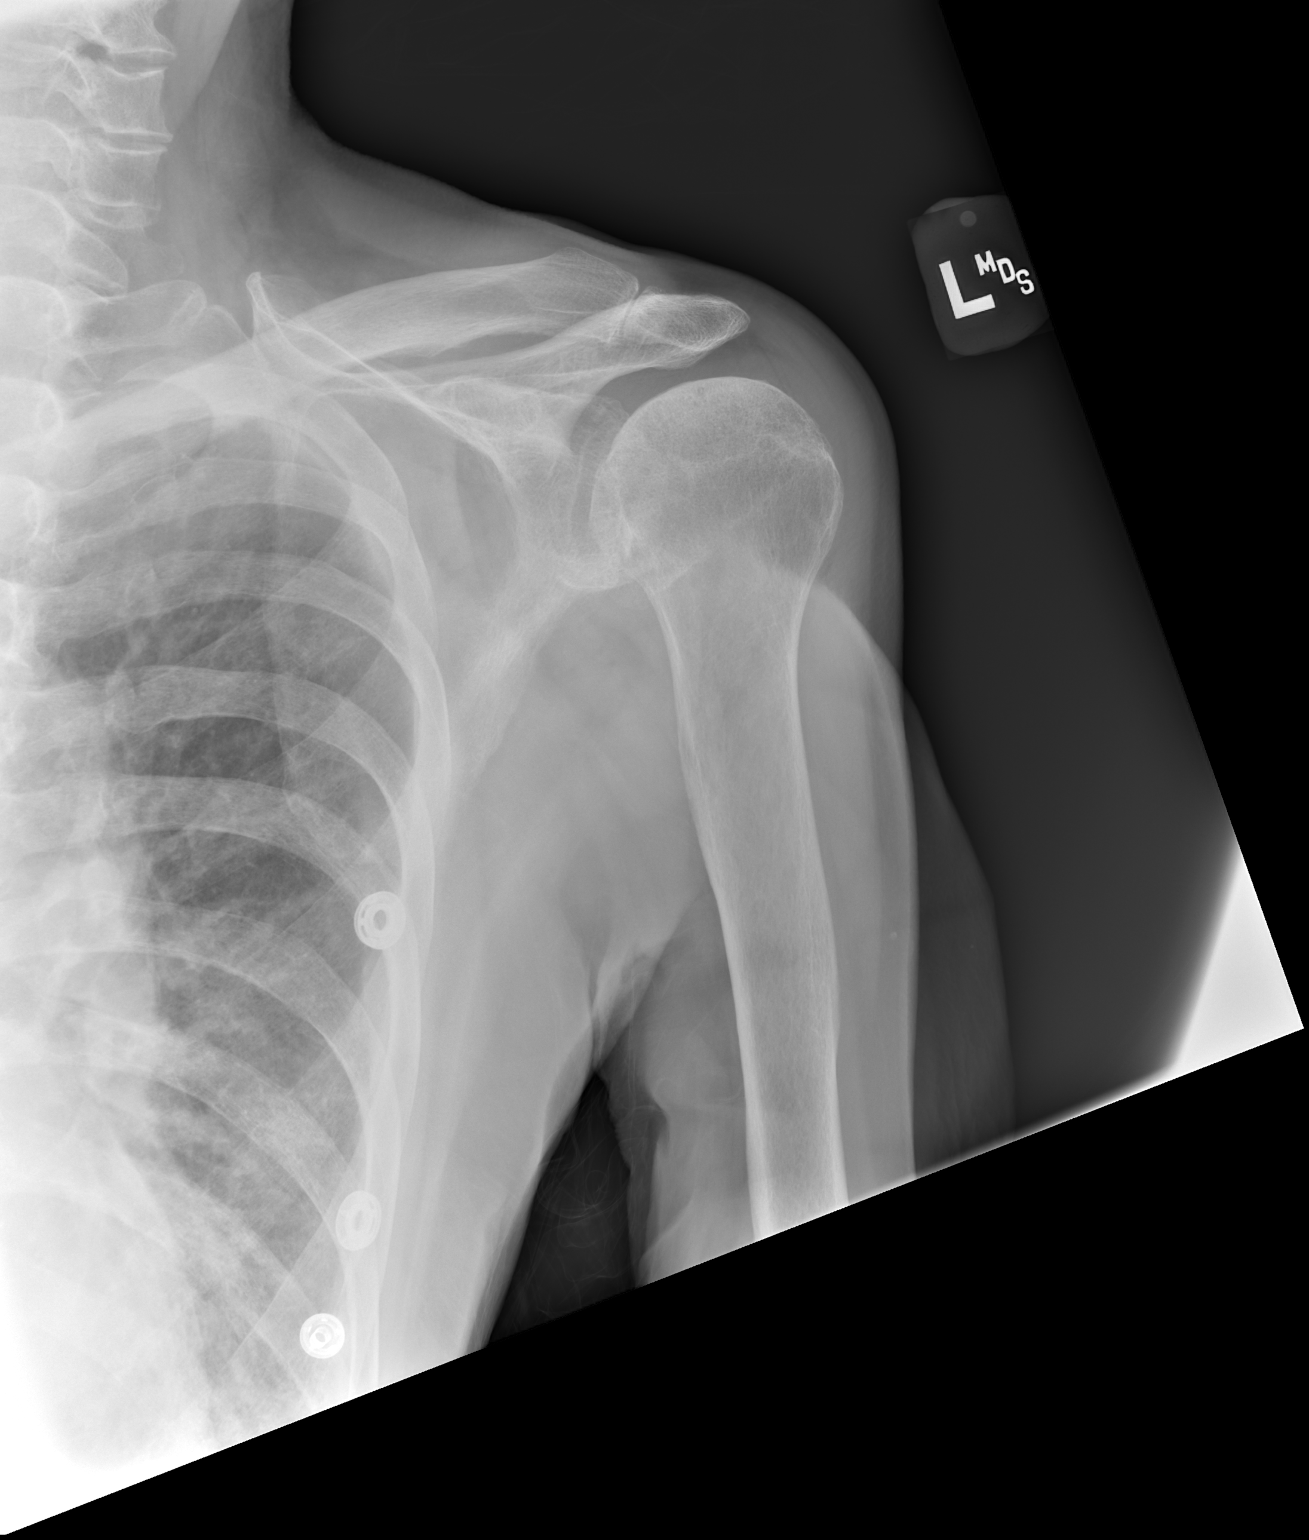

[x shoulder axillary left]
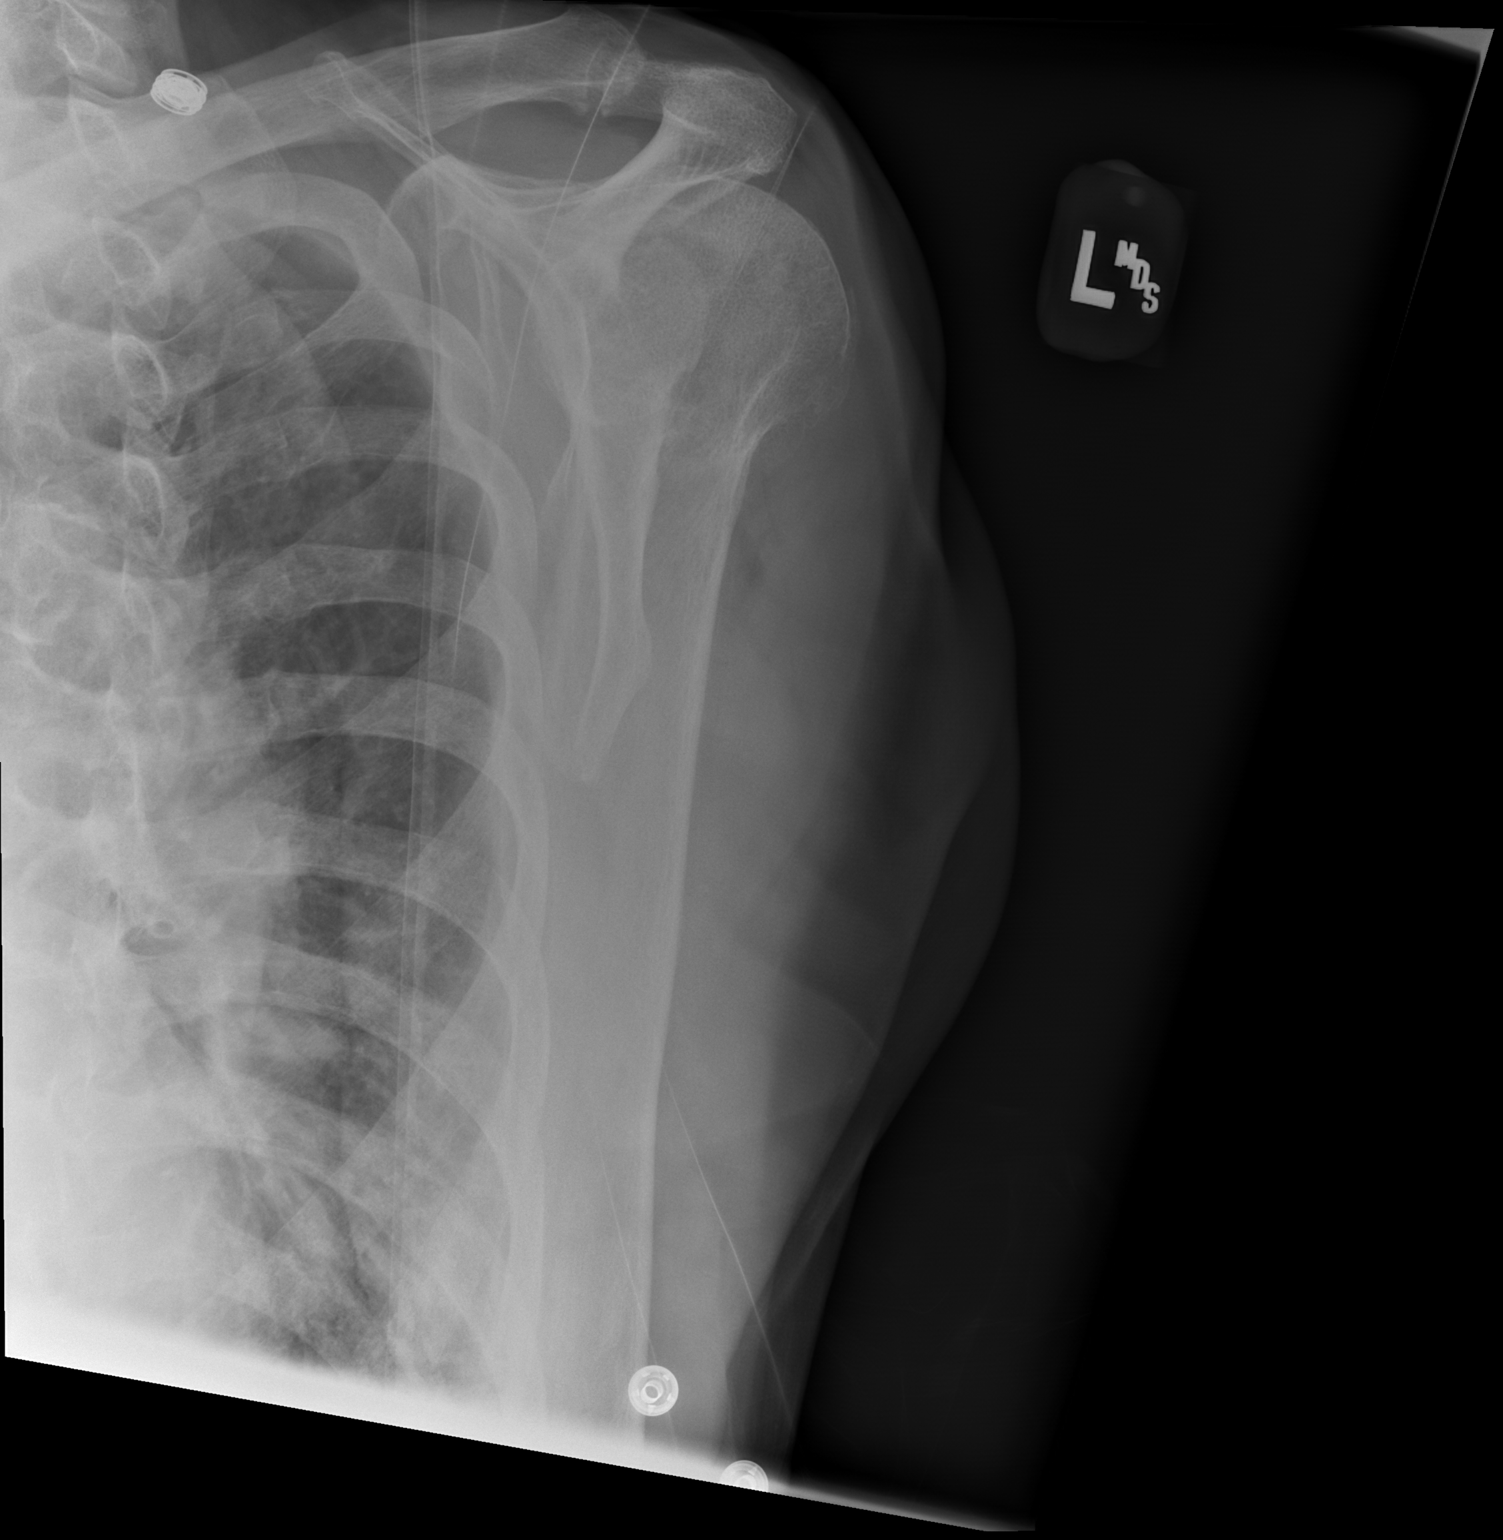

[2 of 2 positions shown; findings below may reference images not displayed]

FINDINGS: There is no evidence of fracture or dislocation. Degenerative joint
changes of the left acromioclavicular joint is noted. Soft tissues
are unremarkable.
IMPRESSION: No acute fracture or dislocation.
